# Patient Record
Sex: Female | Born: 1975 | Race: Black or African American | Hispanic: No | Marital: Single | Smoking: Current every day smoker
Health system: Southern US, Community
[De-identification: ages and names within clinical notes are randomized; demographics above are authoritative.]

## PROBLEM LIST (undated history)

## (undated) ENCOUNTER — Emergency Department (HOSPITAL_COMMUNITY): Discharge status: Against Medical Advice | Payer: Medicare Other

## (undated) ENCOUNTER — Emergency Department (HOSPITAL_COMMUNITY): Admission: EM | Discharge status: Absent without leave | Payer: Self-pay | Source: Home / Self Care

## (undated) DIAGNOSIS — N390 Urinary tract infection, site not specified: Secondary | ICD-10-CM

## (undated) DIAGNOSIS — M109 Gout, unspecified: Secondary | ICD-10-CM

## (undated) DIAGNOSIS — F419 Anxiety disorder, unspecified: Secondary | ICD-10-CM

## (undated) DIAGNOSIS — F329 Major depressive disorder, single episode, unspecified: Secondary | ICD-10-CM

## (undated) DIAGNOSIS — E119 Type 2 diabetes mellitus without complications: Secondary | ICD-10-CM

## (undated) DIAGNOSIS — I75029 Atheroembolism of unspecified lower extremity: Secondary | ICD-10-CM

## (undated) DIAGNOSIS — F319 Bipolar disorder, unspecified: Secondary | ICD-10-CM

## (undated) DIAGNOSIS — D649 Anemia, unspecified: Secondary | ICD-10-CM

## (undated) DIAGNOSIS — R51 Headache: Secondary | ICD-10-CM

## (undated) DIAGNOSIS — J302 Other seasonal allergic rhinitis: Secondary | ICD-10-CM

## (undated) DIAGNOSIS — C029 Malignant neoplasm of tongue, unspecified: Secondary | ICD-10-CM

## (undated) DIAGNOSIS — R87619 Unspecified abnormal cytological findings in specimens from cervix uteri: Secondary | ICD-10-CM

## (undated) DIAGNOSIS — IMO0002 Reserved for concepts with insufficient information to code with codable children: Secondary | ICD-10-CM

## (undated) DIAGNOSIS — F32A Depression, unspecified: Secondary | ICD-10-CM

## (undated) DIAGNOSIS — N200 Calculus of kidney: Secondary | ICD-10-CM

## (undated) HISTORY — DX: Major depressive disorder, single episode, unspecified: F32.9

## (undated) HISTORY — DX: Depression, unspecified: F32.A

## (undated) HISTORY — DX: Anxiety disorder, unspecified: F41.9

## (undated) HISTORY — DX: Atheroembolism of unspecified lower extremity: I75.029

## (undated) HISTORY — PX: CHOLECYSTECTOMY: SHX55

---

## 1997-10-27 ENCOUNTER — Other Ambulatory Visit: Admission: RE | Admit: 1997-10-27 | Discharge: 1997-10-27 | Payer: Self-pay | Admitting: Obstetrics

## 1998-01-18 ENCOUNTER — Inpatient Hospital Stay (HOSPITAL_COMMUNITY): Admission: AD | Admit: 1998-01-18 | Discharge: 1998-01-18 | Payer: Self-pay | Admitting: Obstetrics

## 1998-01-21 ENCOUNTER — Inpatient Hospital Stay (HOSPITAL_COMMUNITY): Admission: AD | Admit: 1998-01-21 | Discharge: 1998-01-21 | Payer: Self-pay | Admitting: Obstetrics

## 1998-01-21 ENCOUNTER — Emergency Department (HOSPITAL_COMMUNITY): Admission: EM | Admit: 1998-01-21 | Discharge: 1998-01-21 | Payer: Self-pay | Admitting: Emergency Medicine

## 1998-03-09 ENCOUNTER — Ambulatory Visit (HOSPITAL_COMMUNITY): Admission: RE | Admit: 1998-03-09 | Discharge: 1998-03-09 | Payer: Self-pay | Admitting: *Deleted

## 1998-04-04 ENCOUNTER — Ambulatory Visit (HOSPITAL_COMMUNITY): Admission: RE | Admit: 1998-04-04 | Discharge: 1998-04-04 | Payer: Self-pay | Admitting: *Deleted

## 1998-05-16 ENCOUNTER — Emergency Department (HOSPITAL_COMMUNITY): Admission: EM | Admit: 1998-05-16 | Discharge: 1998-05-16 | Payer: Self-pay | Admitting: Emergency Medicine

## 1998-05-17 ENCOUNTER — Encounter: Payer: Self-pay | Admitting: Emergency Medicine

## 1998-05-17 ENCOUNTER — Ambulatory Visit (HOSPITAL_COMMUNITY): Admission: RE | Admit: 1998-05-17 | Discharge: 1998-05-17 | Payer: Self-pay | Admitting: Emergency Medicine

## 1998-06-19 ENCOUNTER — Emergency Department (HOSPITAL_COMMUNITY): Admission: EM | Admit: 1998-06-19 | Discharge: 1998-06-19 | Payer: Self-pay | Admitting: Emergency Medicine

## 1998-09-11 ENCOUNTER — Emergency Department (HOSPITAL_COMMUNITY): Admission: EM | Admit: 1998-09-11 | Discharge: 1998-09-11 | Payer: Self-pay | Admitting: Emergency Medicine

## 1998-09-11 ENCOUNTER — Emergency Department (HOSPITAL_COMMUNITY): Admission: EM | Admit: 1998-09-11 | Discharge: 1998-09-11 | Payer: Self-pay

## 1998-09-14 ENCOUNTER — Inpatient Hospital Stay (HOSPITAL_COMMUNITY): Admission: AD | Admit: 1998-09-14 | Discharge: 1998-09-14 | Payer: Self-pay | Admitting: Obstetrics

## 1998-09-27 ENCOUNTER — Emergency Department (HOSPITAL_COMMUNITY): Admission: EM | Admit: 1998-09-27 | Discharge: 1998-09-27 | Payer: Self-pay | Admitting: Emergency Medicine

## 1998-10-31 ENCOUNTER — Inpatient Hospital Stay (HOSPITAL_COMMUNITY): Admission: AD | Admit: 1998-10-31 | Discharge: 1998-10-31 | Payer: Self-pay | Admitting: *Deleted

## 1998-12-13 ENCOUNTER — Emergency Department (HOSPITAL_COMMUNITY): Admission: EM | Admit: 1998-12-13 | Discharge: 1998-12-13 | Payer: Self-pay | Admitting: Emergency Medicine

## 1999-02-10 ENCOUNTER — Emergency Department (HOSPITAL_COMMUNITY): Admission: EM | Admit: 1999-02-10 | Discharge: 1999-02-10 | Payer: Self-pay | Admitting: Emergency Medicine

## 1999-02-12 ENCOUNTER — Inpatient Hospital Stay (HOSPITAL_COMMUNITY): Admission: AD | Admit: 1999-02-12 | Discharge: 1999-02-12 | Payer: Self-pay | Admitting: Obstetrics

## 1999-04-25 ENCOUNTER — Emergency Department (HOSPITAL_COMMUNITY): Admission: EM | Admit: 1999-04-25 | Discharge: 1999-04-25 | Payer: Self-pay | Admitting: Emergency Medicine

## 1999-05-09 ENCOUNTER — Encounter: Payer: Self-pay | Admitting: Emergency Medicine

## 1999-05-09 ENCOUNTER — Emergency Department (HOSPITAL_COMMUNITY): Admission: EM | Admit: 1999-05-09 | Discharge: 1999-05-09 | Payer: Self-pay | Admitting: Emergency Medicine

## 1999-05-10 ENCOUNTER — Emergency Department (HOSPITAL_COMMUNITY): Admission: EM | Admit: 1999-05-10 | Discharge: 1999-05-10 | Payer: Self-pay | Admitting: Emergency Medicine

## 1999-06-01 ENCOUNTER — Inpatient Hospital Stay (HOSPITAL_COMMUNITY): Admission: AD | Admit: 1999-06-01 | Discharge: 1999-06-01 | Payer: Self-pay | Admitting: Obstetrics & Gynecology

## 1999-06-10 ENCOUNTER — Emergency Department (HOSPITAL_COMMUNITY): Admission: EM | Admit: 1999-06-10 | Discharge: 1999-06-10 | Payer: Self-pay | Admitting: Emergency Medicine

## 1999-08-22 ENCOUNTER — Emergency Department (HOSPITAL_COMMUNITY): Admission: EM | Admit: 1999-08-22 | Discharge: 1999-08-22 | Payer: Self-pay | Admitting: Emergency Medicine

## 1999-10-21 ENCOUNTER — Emergency Department (HOSPITAL_COMMUNITY): Admission: EM | Admit: 1999-10-21 | Discharge: 1999-10-21 | Payer: Self-pay | Admitting: Emergency Medicine

## 1999-11-28 ENCOUNTER — Other Ambulatory Visit: Admission: RE | Admit: 1999-11-28 | Discharge: 1999-11-28 | Payer: Self-pay | Admitting: Obstetrics

## 1999-12-17 ENCOUNTER — Inpatient Hospital Stay (HOSPITAL_COMMUNITY): Admission: AD | Admit: 1999-12-17 | Discharge: 1999-12-17 | Payer: Self-pay | Admitting: Obstetrics

## 1999-12-22 ENCOUNTER — Inpatient Hospital Stay (HOSPITAL_COMMUNITY): Admission: AD | Admit: 1999-12-22 | Discharge: 1999-12-22 | Payer: Self-pay | Admitting: Obstetrics

## 1999-12-25 ENCOUNTER — Encounter (INDEPENDENT_AMBULATORY_CARE_PROVIDER_SITE_OTHER): Payer: Self-pay

## 1999-12-25 ENCOUNTER — Encounter: Payer: Self-pay | Admitting: Obstetrics

## 1999-12-25 ENCOUNTER — Ambulatory Visit (HOSPITAL_COMMUNITY): Admission: AD | Admit: 1999-12-25 | Discharge: 1999-12-25 | Payer: Self-pay | Admitting: Obstetrics

## 2000-03-14 ENCOUNTER — Emergency Department (HOSPITAL_COMMUNITY): Admission: EM | Admit: 2000-03-14 | Discharge: 2000-03-14 | Payer: Self-pay

## 2000-04-07 ENCOUNTER — Encounter: Payer: Self-pay | Admitting: Emergency Medicine

## 2000-04-07 ENCOUNTER — Emergency Department (HOSPITAL_COMMUNITY): Admission: EM | Admit: 2000-04-07 | Discharge: 2000-04-07 | Payer: Self-pay | Admitting: Emergency Medicine

## 2000-05-05 ENCOUNTER — Emergency Department (HOSPITAL_COMMUNITY): Admission: EM | Admit: 2000-05-05 | Discharge: 2000-05-05 | Payer: Self-pay | Admitting: Emergency Medicine

## 2000-05-23 ENCOUNTER — Emergency Department (HOSPITAL_COMMUNITY): Admission: EM | Admit: 2000-05-23 | Discharge: 2000-05-24 | Payer: Self-pay | Admitting: Emergency Medicine

## 2000-11-14 ENCOUNTER — Encounter: Payer: Self-pay | Admitting: Emergency Medicine

## 2000-11-14 ENCOUNTER — Emergency Department (HOSPITAL_COMMUNITY): Admission: EM | Admit: 2000-11-14 | Discharge: 2000-11-14 | Payer: Self-pay | Admitting: Emergency Medicine

## 2000-11-20 ENCOUNTER — Emergency Department (HOSPITAL_COMMUNITY): Admission: EM | Admit: 2000-11-20 | Discharge: 2000-11-20 | Payer: Self-pay | Admitting: Emergency Medicine

## 2000-11-21 ENCOUNTER — Encounter: Payer: Self-pay | Admitting: Emergency Medicine

## 2001-01-02 ENCOUNTER — Emergency Department (HOSPITAL_COMMUNITY): Admission: EM | Admit: 2001-01-02 | Discharge: 2001-01-02 | Payer: Self-pay | Admitting: Emergency Medicine

## 2001-02-19 ENCOUNTER — Ambulatory Visit (HOSPITAL_COMMUNITY): Admission: RE | Admit: 2001-02-19 | Discharge: 2001-02-20 | Payer: Self-pay | Admitting: General Surgery

## 2001-02-19 ENCOUNTER — Encounter (HOSPITAL_BASED_OUTPATIENT_CLINIC_OR_DEPARTMENT_OTHER): Payer: Self-pay | Admitting: General Surgery

## 2001-02-19 ENCOUNTER — Encounter (INDEPENDENT_AMBULATORY_CARE_PROVIDER_SITE_OTHER): Payer: Self-pay | Admitting: *Deleted

## 2001-02-23 ENCOUNTER — Emergency Department (HOSPITAL_COMMUNITY): Admission: EM | Admit: 2001-02-23 | Discharge: 2001-02-23 | Payer: Self-pay | Admitting: Emergency Medicine

## 2001-02-23 ENCOUNTER — Encounter: Payer: Self-pay | Admitting: Emergency Medicine

## 2001-06-11 ENCOUNTER — Emergency Department (HOSPITAL_COMMUNITY): Admission: EM | Admit: 2001-06-11 | Discharge: 2001-06-11 | Payer: Self-pay | Admitting: Emergency Medicine

## 2001-06-15 ENCOUNTER — Emergency Department (HOSPITAL_COMMUNITY): Admission: EM | Admit: 2001-06-15 | Discharge: 2001-06-15 | Payer: Self-pay | Admitting: Emergency Medicine

## 2001-06-25 ENCOUNTER — Emergency Department (HOSPITAL_COMMUNITY): Admission: EM | Admit: 2001-06-25 | Discharge: 2001-06-25 | Payer: Self-pay | Admitting: Emergency Medicine

## 2001-07-16 ENCOUNTER — Emergency Department (HOSPITAL_COMMUNITY): Admission: EM | Admit: 2001-07-16 | Discharge: 2001-07-16 | Payer: Self-pay | Admitting: Emergency Medicine

## 2001-08-26 ENCOUNTER — Encounter: Admission: RE | Admit: 2001-08-26 | Discharge: 2001-11-24 | Payer: Self-pay | Admitting: Internal Medicine

## 2001-09-16 ENCOUNTER — Inpatient Hospital Stay (HOSPITAL_COMMUNITY): Admission: AD | Admit: 2001-09-16 | Discharge: 2001-09-16 | Payer: Self-pay | Admitting: *Deleted

## 2001-11-21 ENCOUNTER — Encounter: Payer: Self-pay | Admitting: Obstetrics

## 2001-11-21 ENCOUNTER — Ambulatory Visit (HOSPITAL_COMMUNITY): Admission: RE | Admit: 2001-11-21 | Discharge: 2001-11-21 | Payer: Self-pay | Admitting: Obstetrics

## 2002-01-14 ENCOUNTER — Inpatient Hospital Stay (HOSPITAL_COMMUNITY): Admission: AD | Admit: 2002-01-14 | Discharge: 2002-01-14 | Payer: Self-pay | Admitting: Obstetrics

## 2002-02-04 ENCOUNTER — Encounter: Payer: Self-pay | Admitting: *Deleted

## 2002-02-04 ENCOUNTER — Ambulatory Visit (HOSPITAL_COMMUNITY): Admission: RE | Admit: 2002-02-04 | Discharge: 2002-02-04 | Payer: Self-pay | Admitting: *Deleted

## 2002-02-16 ENCOUNTER — Inpatient Hospital Stay (HOSPITAL_COMMUNITY): Admission: AD | Admit: 2002-02-16 | Discharge: 2002-02-16 | Payer: Self-pay | Admitting: *Deleted

## 2002-03-05 ENCOUNTER — Encounter (HOSPITAL_COMMUNITY): Admission: RE | Admit: 2002-03-05 | Discharge: 2002-04-04 | Payer: Self-pay | Admitting: *Deleted

## 2002-03-30 ENCOUNTER — Inpatient Hospital Stay (HOSPITAL_COMMUNITY): Admission: AD | Admit: 2002-03-30 | Discharge: 2002-03-30 | Payer: Self-pay | Admitting: *Deleted

## 2002-04-04 ENCOUNTER — Inpatient Hospital Stay (HOSPITAL_COMMUNITY): Admission: AD | Admit: 2002-04-04 | Discharge: 2002-04-06 | Payer: Self-pay | Admitting: *Deleted

## 2002-04-04 ENCOUNTER — Encounter (INDEPENDENT_AMBULATORY_CARE_PROVIDER_SITE_OTHER): Payer: Self-pay | Admitting: Specialist

## 2002-04-07 ENCOUNTER — Inpatient Hospital Stay (HOSPITAL_COMMUNITY): Admission: AD | Admit: 2002-04-07 | Discharge: 2002-04-07 | Payer: Self-pay | Admitting: *Deleted

## 2002-07-23 ENCOUNTER — Emergency Department (HOSPITAL_COMMUNITY): Admission: EM | Admit: 2002-07-23 | Discharge: 2002-07-23 | Payer: Self-pay | Admitting: Emergency Medicine

## 2002-07-28 ENCOUNTER — Other Ambulatory Visit: Admission: RE | Admit: 2002-07-28 | Discharge: 2002-07-28 | Payer: Self-pay | Admitting: *Deleted

## 2002-08-08 ENCOUNTER — Emergency Department (HOSPITAL_COMMUNITY): Admission: EM | Admit: 2002-08-08 | Discharge: 2002-08-08 | Payer: Self-pay | Admitting: Emergency Medicine

## 2002-09-02 ENCOUNTER — Emergency Department (HOSPITAL_COMMUNITY): Admission: EM | Admit: 2002-09-02 | Discharge: 2002-09-02 | Payer: Self-pay | Admitting: Emergency Medicine

## 2002-10-05 ENCOUNTER — Emergency Department (HOSPITAL_COMMUNITY): Admission: EM | Admit: 2002-10-05 | Discharge: 2002-10-06 | Payer: Self-pay | Admitting: Emergency Medicine

## 2002-10-10 ENCOUNTER — Emergency Department (HOSPITAL_COMMUNITY): Admission: EM | Admit: 2002-10-10 | Discharge: 2002-10-10 | Payer: Self-pay | Admitting: Emergency Medicine

## 2002-10-11 ENCOUNTER — Emergency Department (HOSPITAL_COMMUNITY): Admission: EM | Admit: 2002-10-11 | Discharge: 2002-10-11 | Payer: Self-pay | Admitting: Emergency Medicine

## 2002-10-16 ENCOUNTER — Emergency Department (HOSPITAL_COMMUNITY): Admission: EM | Admit: 2002-10-16 | Discharge: 2002-10-17 | Payer: Self-pay

## 2002-10-28 ENCOUNTER — Emergency Department (HOSPITAL_COMMUNITY): Admission: EM | Admit: 2002-10-28 | Discharge: 2002-10-28 | Payer: Self-pay | Admitting: Emergency Medicine

## 2002-11-02 ENCOUNTER — Emergency Department (HOSPITAL_COMMUNITY): Admission: EM | Admit: 2002-11-02 | Discharge: 2002-11-02 | Payer: Self-pay | Admitting: Emergency Medicine

## 2002-11-11 ENCOUNTER — Emergency Department (HOSPITAL_COMMUNITY): Admission: EM | Admit: 2002-11-11 | Discharge: 2002-11-11 | Payer: Self-pay | Admitting: Emergency Medicine

## 2002-11-15 ENCOUNTER — Emergency Department (HOSPITAL_COMMUNITY): Admission: EM | Admit: 2002-11-15 | Discharge: 2002-11-15 | Payer: Self-pay | Admitting: Emergency Medicine

## 2002-11-23 ENCOUNTER — Emergency Department (HOSPITAL_COMMUNITY): Admission: EM | Admit: 2002-11-23 | Discharge: 2002-11-23 | Payer: Self-pay | Admitting: Emergency Medicine

## 2002-11-24 ENCOUNTER — Emergency Department (HOSPITAL_COMMUNITY): Admission: EM | Admit: 2002-11-24 | Discharge: 2002-11-24 | Payer: Self-pay | Admitting: Emergency Medicine

## 2002-12-04 ENCOUNTER — Emergency Department (HOSPITAL_COMMUNITY): Admission: EM | Admit: 2002-12-04 | Discharge: 2002-12-04 | Payer: Self-pay | Admitting: Emergency Medicine

## 2002-12-04 ENCOUNTER — Encounter: Payer: Self-pay | Admitting: Emergency Medicine

## 2002-12-19 ENCOUNTER — Inpatient Hospital Stay (HOSPITAL_COMMUNITY): Admission: AD | Admit: 2002-12-19 | Discharge: 2002-12-19 | Payer: Self-pay | Admitting: Family Medicine

## 2002-12-21 ENCOUNTER — Emergency Department (HOSPITAL_COMMUNITY): Admission: EM | Admit: 2002-12-21 | Discharge: 2002-12-21 | Payer: Self-pay | Admitting: Emergency Medicine

## 2003-02-06 ENCOUNTER — Emergency Department (HOSPITAL_COMMUNITY): Admission: EM | Admit: 2003-02-06 | Discharge: 2003-02-06 | Payer: Self-pay | Admitting: Emergency Medicine

## 2003-02-11 ENCOUNTER — Emergency Department (HOSPITAL_COMMUNITY): Admission: EM | Admit: 2003-02-11 | Discharge: 2003-02-11 | Payer: Self-pay | Admitting: Emergency Medicine

## 2003-02-23 ENCOUNTER — Encounter: Admission: RE | Admit: 2003-02-23 | Discharge: 2003-05-24 | Payer: Self-pay | Admitting: Family Medicine

## 2003-02-26 ENCOUNTER — Emergency Department (HOSPITAL_COMMUNITY): Admission: EM | Admit: 2003-02-26 | Discharge: 2003-02-26 | Payer: Self-pay | Admitting: Emergency Medicine

## 2003-03-12 ENCOUNTER — Other Ambulatory Visit: Admission: RE | Admit: 2003-03-12 | Discharge: 2003-03-12 | Payer: Self-pay | Admitting: Obstetrics and Gynecology

## 2003-03-16 ENCOUNTER — Emergency Department (HOSPITAL_COMMUNITY): Admission: EM | Admit: 2003-03-16 | Discharge: 2003-03-17 | Payer: Self-pay | Admitting: Emergency Medicine

## 2003-04-05 ENCOUNTER — Emergency Department (HOSPITAL_COMMUNITY): Admission: AD | Admit: 2003-04-05 | Discharge: 2003-04-05 | Payer: Self-pay

## 2003-04-27 ENCOUNTER — Emergency Department (HOSPITAL_COMMUNITY): Admission: EM | Admit: 2003-04-27 | Discharge: 2003-04-27 | Payer: Self-pay | Admitting: Emergency Medicine

## 2003-05-10 ENCOUNTER — Encounter: Admission: RE | Admit: 2003-05-10 | Discharge: 2003-05-10 | Payer: Self-pay | Admitting: Family Medicine

## 2003-05-26 ENCOUNTER — Encounter: Admission: RE | Admit: 2003-05-26 | Discharge: 2003-05-26 | Payer: Self-pay | Admitting: *Deleted

## 2003-06-02 ENCOUNTER — Encounter: Admission: RE | Admit: 2003-06-02 | Discharge: 2003-06-02 | Payer: Self-pay | Admitting: *Deleted

## 2003-06-02 ENCOUNTER — Ambulatory Visit (HOSPITAL_COMMUNITY): Admission: RE | Admit: 2003-06-02 | Discharge: 2003-06-02 | Payer: Self-pay | Admitting: *Deleted

## 2003-06-10 ENCOUNTER — Encounter: Admission: RE | Admit: 2003-06-10 | Discharge: 2003-06-10 | Payer: Self-pay | Admitting: *Deleted

## 2003-06-17 ENCOUNTER — Encounter: Admission: RE | Admit: 2003-06-17 | Discharge: 2003-06-17 | Payer: Self-pay | Admitting: *Deleted

## 2003-06-21 ENCOUNTER — Inpatient Hospital Stay (HOSPITAL_COMMUNITY): Admission: AD | Admit: 2003-06-21 | Discharge: 2003-06-23 | Payer: Self-pay | Admitting: Obstetrics & Gynecology

## 2003-07-01 ENCOUNTER — Encounter: Admission: RE | Admit: 2003-07-01 | Discharge: 2003-07-01 | Payer: Self-pay | Admitting: *Deleted

## 2003-07-25 ENCOUNTER — Emergency Department (HOSPITAL_COMMUNITY): Admission: EM | Admit: 2003-07-25 | Discharge: 2003-07-25 | Payer: Self-pay | Admitting: Emergency Medicine

## 2003-08-22 ENCOUNTER — Emergency Department (HOSPITAL_COMMUNITY): Admission: EM | Admit: 2003-08-22 | Discharge: 2003-08-22 | Payer: Self-pay | Admitting: Emergency Medicine

## 2003-09-01 ENCOUNTER — Encounter: Admission: RE | Admit: 2003-09-01 | Discharge: 2003-09-01 | Payer: Self-pay | Admitting: *Deleted

## 2003-09-02 ENCOUNTER — Ambulatory Visit (HOSPITAL_COMMUNITY): Admission: RE | Admit: 2003-09-02 | Discharge: 2003-09-02 | Payer: Self-pay | Admitting: *Deleted

## 2003-09-06 ENCOUNTER — Inpatient Hospital Stay (HOSPITAL_COMMUNITY): Admission: RE | Admit: 2003-09-06 | Discharge: 2003-09-06 | Payer: Self-pay | Admitting: Obstetrics and Gynecology

## 2003-09-08 ENCOUNTER — Encounter: Admission: RE | Admit: 2003-09-08 | Discharge: 2003-09-08 | Payer: Self-pay | Admitting: *Deleted

## 2003-09-25 ENCOUNTER — Inpatient Hospital Stay (HOSPITAL_COMMUNITY): Admission: AD | Admit: 2003-09-25 | Discharge: 2003-09-25 | Payer: Self-pay | Admitting: *Deleted

## 2003-09-30 ENCOUNTER — Encounter: Admission: RE | Admit: 2003-09-30 | Discharge: 2003-09-30 | Payer: Self-pay | Admitting: *Deleted

## 2003-10-04 ENCOUNTER — Inpatient Hospital Stay (HOSPITAL_COMMUNITY): Admission: AD | Admit: 2003-10-04 | Discharge: 2003-10-10 | Payer: Self-pay | Admitting: *Deleted

## 2003-10-04 ENCOUNTER — Encounter: Admission: RE | Admit: 2003-10-04 | Discharge: 2003-10-04 | Payer: Self-pay | Admitting: *Deleted

## 2003-10-07 ENCOUNTER — Encounter (INDEPENDENT_AMBULATORY_CARE_PROVIDER_SITE_OTHER): Payer: Self-pay | Admitting: Specialist

## 2003-10-27 ENCOUNTER — Emergency Department (HOSPITAL_COMMUNITY): Admission: EM | Admit: 2003-10-27 | Discharge: 2003-10-27 | Payer: Self-pay | Admitting: Family Medicine

## 2003-10-29 ENCOUNTER — Encounter: Admission: RE | Admit: 2003-10-29 | Discharge: 2003-10-29 | Payer: Self-pay | Admitting: Family Medicine

## 2003-11-08 ENCOUNTER — Emergency Department (HOSPITAL_COMMUNITY): Admission: EM | Admit: 2003-11-08 | Discharge: 2003-11-08 | Payer: Self-pay | Admitting: Emergency Medicine

## 2003-11-22 ENCOUNTER — Emergency Department (HOSPITAL_COMMUNITY): Admission: EM | Admit: 2003-11-22 | Discharge: 2003-11-23 | Payer: Self-pay | Admitting: Emergency Medicine

## 2003-11-24 ENCOUNTER — Emergency Department (HOSPITAL_COMMUNITY): Admission: EM | Admit: 2003-11-24 | Discharge: 2003-11-25 | Payer: Self-pay | Admitting: Emergency Medicine

## 2003-12-10 ENCOUNTER — Emergency Department (HOSPITAL_COMMUNITY): Admission: EM | Admit: 2003-12-10 | Discharge: 2003-12-11 | Payer: Self-pay | Admitting: Emergency Medicine

## 2003-12-25 ENCOUNTER — Emergency Department (HOSPITAL_COMMUNITY): Admission: EM | Admit: 2003-12-25 | Discharge: 2003-12-25 | Payer: Self-pay | Admitting: Family Medicine

## 2004-01-10 ENCOUNTER — Emergency Department (HOSPITAL_COMMUNITY): Admission: EM | Admit: 2004-01-10 | Discharge: 2004-01-10 | Payer: Self-pay | Admitting: Family Medicine

## 2004-06-06 ENCOUNTER — Emergency Department (HOSPITAL_COMMUNITY): Admission: EM | Admit: 2004-06-06 | Discharge: 2004-06-06 | Payer: Self-pay | Admitting: Family Medicine

## 2004-06-24 ENCOUNTER — Emergency Department (HOSPITAL_COMMUNITY): Admission: EM | Admit: 2004-06-24 | Discharge: 2004-06-24 | Payer: Self-pay | Admitting: Emergency Medicine

## 2004-07-21 ENCOUNTER — Emergency Department (HOSPITAL_COMMUNITY): Admission: EM | Admit: 2004-07-21 | Discharge: 2004-07-21 | Payer: Self-pay | Admitting: Emergency Medicine

## 2004-08-25 ENCOUNTER — Emergency Department (HOSPITAL_COMMUNITY): Admission: EM | Admit: 2004-08-25 | Discharge: 2004-08-25 | Payer: Self-pay | Admitting: Emergency Medicine

## 2004-08-26 ENCOUNTER — Emergency Department (HOSPITAL_COMMUNITY): Admission: EM | Admit: 2004-08-26 | Discharge: 2004-08-26 | Payer: Self-pay | Admitting: Emergency Medicine

## 2004-09-04 ENCOUNTER — Ambulatory Visit: Payer: Self-pay | Admitting: Internal Medicine

## 2004-09-04 ENCOUNTER — Emergency Department (HOSPITAL_COMMUNITY): Admission: EM | Admit: 2004-09-04 | Discharge: 2004-09-04 | Payer: Self-pay | Admitting: Emergency Medicine

## 2004-09-27 ENCOUNTER — Ambulatory Visit: Payer: Self-pay | Admitting: Internal Medicine

## 2004-10-01 ENCOUNTER — Emergency Department (HOSPITAL_COMMUNITY): Admission: EM | Admit: 2004-10-01 | Discharge: 2004-10-01 | Payer: Self-pay | Admitting: Emergency Medicine

## 2004-10-06 ENCOUNTER — Encounter: Admission: RE | Admit: 2004-10-06 | Discharge: 2004-10-06 | Payer: Self-pay | Admitting: Cardiology

## 2004-10-06 ENCOUNTER — Ambulatory Visit (HOSPITAL_COMMUNITY): Admission: RE | Admit: 2004-10-06 | Discharge: 2004-10-06 | Payer: Self-pay | Admitting: Cardiology

## 2004-10-17 ENCOUNTER — Ambulatory Visit: Payer: Self-pay | Admitting: Internal Medicine

## 2004-10-29 ENCOUNTER — Inpatient Hospital Stay (HOSPITAL_COMMUNITY): Admission: AD | Admit: 2004-10-29 | Discharge: 2004-10-29 | Payer: Self-pay | Admitting: *Deleted

## 2004-10-30 ENCOUNTER — Emergency Department (HOSPITAL_COMMUNITY): Admission: EM | Admit: 2004-10-30 | Discharge: 2004-10-31 | Payer: Self-pay | Admitting: Emergency Medicine

## 2004-10-30 ENCOUNTER — Ambulatory Visit (HOSPITAL_COMMUNITY): Admission: RE | Admit: 2004-10-30 | Discharge: 2004-10-30 | Payer: Self-pay | Admitting: *Deleted

## 2004-10-31 ENCOUNTER — Emergency Department (HOSPITAL_COMMUNITY): Admission: EM | Admit: 2004-10-31 | Discharge: 2004-10-31 | Payer: Self-pay | Admitting: Emergency Medicine

## 2004-11-02 ENCOUNTER — Ambulatory Visit: Payer: Self-pay | Admitting: Internal Medicine

## 2004-11-11 ENCOUNTER — Emergency Department (HOSPITAL_COMMUNITY): Admission: EM | Admit: 2004-11-11 | Discharge: 2004-11-11 | Payer: Self-pay | Admitting: Emergency Medicine

## 2004-11-16 ENCOUNTER — Ambulatory Visit (HOSPITAL_COMMUNITY): Admission: RE | Admit: 2004-11-16 | Discharge: 2004-11-16 | Payer: Self-pay | Admitting: *Deleted

## 2004-11-21 ENCOUNTER — Emergency Department (HOSPITAL_COMMUNITY): Admission: EM | Admit: 2004-11-21 | Discharge: 2004-11-22 | Payer: Self-pay | Admitting: Emergency Medicine

## 2004-11-25 ENCOUNTER — Emergency Department (HOSPITAL_COMMUNITY): Admission: EM | Admit: 2004-11-25 | Discharge: 2004-11-25 | Payer: Self-pay | Admitting: Emergency Medicine

## 2005-01-04 ENCOUNTER — Ambulatory Visit: Payer: Self-pay | Admitting: Internal Medicine

## 2005-01-16 ENCOUNTER — Emergency Department (HOSPITAL_COMMUNITY): Admission: EM | Admit: 2005-01-16 | Discharge: 2005-01-17 | Payer: Self-pay | Admitting: Emergency Medicine

## 2005-01-17 ENCOUNTER — Emergency Department (HOSPITAL_COMMUNITY): Admission: EM | Admit: 2005-01-17 | Discharge: 2005-01-17 | Payer: Self-pay | Admitting: Emergency Medicine

## 2005-01-18 ENCOUNTER — Ambulatory Visit: Payer: Self-pay | Admitting: Internal Medicine

## 2005-01-22 ENCOUNTER — Emergency Department (HOSPITAL_COMMUNITY): Admission: EM | Admit: 2005-01-22 | Discharge: 2005-01-22 | Payer: Self-pay | Admitting: Emergency Medicine

## 2005-01-23 ENCOUNTER — Inpatient Hospital Stay (HOSPITAL_COMMUNITY): Admission: AD | Admit: 2005-01-23 | Discharge: 2005-01-23 | Payer: Self-pay | Admitting: Obstetrics & Gynecology

## 2005-01-25 ENCOUNTER — Emergency Department (HOSPITAL_COMMUNITY): Admission: EM | Admit: 2005-01-25 | Discharge: 2005-01-25 | Payer: Self-pay | Admitting: Emergency Medicine

## 2005-01-31 ENCOUNTER — Ambulatory Visit: Payer: Self-pay | Admitting: Internal Medicine

## 2005-02-05 ENCOUNTER — Emergency Department (HOSPITAL_COMMUNITY): Admission: EM | Admit: 2005-02-05 | Discharge: 2005-02-05 | Payer: Self-pay | Admitting: *Deleted

## 2005-04-03 ENCOUNTER — Emergency Department (HOSPITAL_COMMUNITY): Admission: EM | Admit: 2005-04-03 | Discharge: 2005-04-03 | Payer: Self-pay | Admitting: Emergency Medicine

## 2005-04-10 ENCOUNTER — Encounter: Admission: RE | Admit: 2005-04-10 | Discharge: 2005-05-08 | Payer: Self-pay | Admitting: Family Medicine

## 2005-06-01 ENCOUNTER — Emergency Department (HOSPITAL_COMMUNITY): Admission: EM | Admit: 2005-06-01 | Discharge: 2005-06-01 | Payer: Self-pay | Admitting: Emergency Medicine

## 2005-06-14 ENCOUNTER — Inpatient Hospital Stay (HOSPITAL_COMMUNITY): Admission: EM | Admit: 2005-06-14 | Discharge: 2005-06-17 | Payer: Self-pay | Admitting: Emergency Medicine

## 2005-06-14 ENCOUNTER — Ambulatory Visit: Payer: Self-pay | Admitting: Family Medicine

## 2005-06-17 ENCOUNTER — Emergency Department (HOSPITAL_COMMUNITY): Admission: EM | Admit: 2005-06-17 | Discharge: 2005-06-17 | Payer: Self-pay | Admitting: Emergency Medicine

## 2005-07-05 ENCOUNTER — Ambulatory Visit (HOSPITAL_COMMUNITY): Admission: RE | Admit: 2005-07-05 | Discharge: 2005-07-05 | Payer: Self-pay | Admitting: Family Medicine

## 2005-07-09 ENCOUNTER — Ambulatory Visit: Payer: Self-pay | Admitting: Internal Medicine

## 2005-07-26 ENCOUNTER — Ambulatory Visit: Payer: Self-pay | Admitting: Internal Medicine

## 2005-07-31 ENCOUNTER — Emergency Department (HOSPITAL_COMMUNITY): Admission: EM | Admit: 2005-07-31 | Discharge: 2005-08-01 | Payer: Self-pay | Admitting: Emergency Medicine

## 2005-08-21 ENCOUNTER — Emergency Department (HOSPITAL_COMMUNITY): Admission: EM | Admit: 2005-08-21 | Discharge: 2005-08-21 | Payer: Self-pay | Admitting: Family Medicine

## 2005-09-05 ENCOUNTER — Emergency Department (HOSPITAL_COMMUNITY): Admission: EM | Admit: 2005-09-05 | Discharge: 2005-09-05 | Payer: Self-pay | Admitting: Emergency Medicine

## 2005-10-16 ENCOUNTER — Inpatient Hospital Stay (HOSPITAL_COMMUNITY): Admission: EM | Admit: 2005-10-16 | Discharge: 2005-10-17 | Payer: Self-pay | Admitting: Emergency Medicine

## 2005-11-07 ENCOUNTER — Ambulatory Visit: Payer: Self-pay | Admitting: Oncology

## 2005-11-21 ENCOUNTER — Emergency Department (HOSPITAL_COMMUNITY): Admission: EM | Admit: 2005-11-21 | Discharge: 2005-11-21 | Payer: Self-pay | Admitting: Family Medicine

## 2006-01-02 ENCOUNTER — Encounter: Admission: RE | Admit: 2006-01-02 | Discharge: 2006-01-02 | Payer: Self-pay | Admitting: Orthopedic Surgery

## 2006-01-20 ENCOUNTER — Emergency Department (HOSPITAL_COMMUNITY): Admission: EM | Admit: 2006-01-20 | Discharge: 2006-01-20 | Payer: Self-pay | Admitting: Emergency Medicine

## 2006-01-21 ENCOUNTER — Emergency Department (HOSPITAL_COMMUNITY): Admission: EM | Admit: 2006-01-21 | Discharge: 2006-01-21 | Payer: Self-pay | Admitting: Emergency Medicine

## 2006-01-29 ENCOUNTER — Observation Stay (HOSPITAL_COMMUNITY): Admission: EM | Admit: 2006-01-29 | Discharge: 2006-01-30 | Payer: Self-pay | Admitting: *Deleted

## 2006-03-04 ENCOUNTER — Ambulatory Visit: Payer: Self-pay | Admitting: Internal Medicine

## 2006-03-30 ENCOUNTER — Emergency Department (HOSPITAL_COMMUNITY): Admission: EM | Admit: 2006-03-30 | Discharge: 2006-03-30 | Payer: Self-pay | Admitting: Family Medicine

## 2006-04-07 ENCOUNTER — Emergency Department (HOSPITAL_COMMUNITY): Admission: EM | Admit: 2006-04-07 | Discharge: 2006-04-08 | Payer: Self-pay | Admitting: Emergency Medicine

## 2006-04-18 ENCOUNTER — Emergency Department (HOSPITAL_COMMUNITY): Admission: EM | Admit: 2006-04-18 | Discharge: 2006-04-18 | Payer: Self-pay | Admitting: Pediatrics

## 2006-07-02 ENCOUNTER — Emergency Department (HOSPITAL_COMMUNITY): Admission: EM | Admit: 2006-07-02 | Discharge: 2006-07-02 | Payer: Self-pay | Admitting: Emergency Medicine

## 2006-07-16 ENCOUNTER — Emergency Department (HOSPITAL_COMMUNITY): Admission: EM | Admit: 2006-07-16 | Discharge: 2006-07-16 | Payer: Self-pay | Admitting: Emergency Medicine

## 2006-07-25 ENCOUNTER — Other Ambulatory Visit: Admission: RE | Admit: 2006-07-25 | Discharge: 2006-07-25 | Payer: Self-pay | Admitting: Family Medicine

## 2006-08-10 ENCOUNTER — Emergency Department (HOSPITAL_COMMUNITY): Admission: EM | Admit: 2006-08-10 | Discharge: 2006-08-10 | Payer: Self-pay | Admitting: Family Medicine

## 2006-11-18 ENCOUNTER — Emergency Department (HOSPITAL_COMMUNITY): Admission: EM | Admit: 2006-11-18 | Discharge: 2006-11-18 | Payer: Self-pay | Admitting: Emergency Medicine

## 2006-12-01 ENCOUNTER — Emergency Department (HOSPITAL_COMMUNITY): Admission: EM | Admit: 2006-12-01 | Discharge: 2006-12-01 | Payer: Self-pay | Admitting: Emergency Medicine

## 2006-12-25 ENCOUNTER — Emergency Department (HOSPITAL_COMMUNITY): Admission: EM | Admit: 2006-12-25 | Discharge: 2006-12-25 | Payer: Self-pay | Admitting: Emergency Medicine

## 2006-12-26 ENCOUNTER — Emergency Department (HOSPITAL_COMMUNITY): Admission: EM | Admit: 2006-12-26 | Discharge: 2006-12-26 | Payer: Self-pay | Admitting: Emergency Medicine

## 2007-01-03 ENCOUNTER — Inpatient Hospital Stay (HOSPITAL_COMMUNITY): Admission: EM | Admit: 2007-01-03 | Discharge: 2007-01-07 | Payer: Self-pay | Admitting: Emergency Medicine

## 2007-01-13 ENCOUNTER — Inpatient Hospital Stay (HOSPITAL_COMMUNITY): Admission: EM | Admit: 2007-01-13 | Discharge: 2007-01-19 | Payer: Self-pay | Admitting: Emergency Medicine

## 2007-01-22 ENCOUNTER — Inpatient Hospital Stay (HOSPITAL_COMMUNITY): Admission: EM | Admit: 2007-01-22 | Discharge: 2007-01-26 | Payer: Self-pay | Admitting: *Deleted

## 2007-02-19 ENCOUNTER — Emergency Department (HOSPITAL_COMMUNITY): Admission: EM | Admit: 2007-02-19 | Discharge: 2007-02-19 | Payer: Self-pay | Admitting: Emergency Medicine

## 2007-04-15 ENCOUNTER — Emergency Department (HOSPITAL_COMMUNITY): Admission: EM | Admit: 2007-04-15 | Discharge: 2007-04-16 | Payer: Self-pay | Admitting: Emergency Medicine

## 2007-04-16 ENCOUNTER — Ambulatory Visit: Payer: Self-pay | Admitting: Internal Medicine

## 2007-04-16 ENCOUNTER — Inpatient Hospital Stay (HOSPITAL_COMMUNITY): Admission: EM | Admit: 2007-04-16 | Discharge: 2007-04-17 | Payer: Self-pay | Admitting: Emergency Medicine

## 2007-04-18 ENCOUNTER — Emergency Department (HOSPITAL_COMMUNITY): Admission: EM | Admit: 2007-04-18 | Discharge: 2007-04-18 | Payer: Self-pay | Admitting: Emergency Medicine

## 2007-05-05 DIAGNOSIS — L0292 Furuncle, unspecified: Secondary | ICD-10-CM | POA: Insufficient documentation

## 2007-05-05 DIAGNOSIS — K219 Gastro-esophageal reflux disease without esophagitis: Secondary | ICD-10-CM

## 2007-05-05 DIAGNOSIS — F17200 Nicotine dependence, unspecified, uncomplicated: Secondary | ICD-10-CM

## 2007-05-05 DIAGNOSIS — L0293 Carbuncle, unspecified: Secondary | ICD-10-CM

## 2007-05-05 DIAGNOSIS — E119 Type 2 diabetes mellitus without complications: Secondary | ICD-10-CM

## 2007-05-05 DIAGNOSIS — J309 Allergic rhinitis, unspecified: Secondary | ICD-10-CM | POA: Insufficient documentation

## 2007-05-05 DIAGNOSIS — M549 Dorsalgia, unspecified: Secondary | ICD-10-CM

## 2007-05-05 DIAGNOSIS — D509 Iron deficiency anemia, unspecified: Secondary | ICD-10-CM | POA: Insufficient documentation

## 2007-05-05 DIAGNOSIS — F411 Generalized anxiety disorder: Secondary | ICD-10-CM | POA: Insufficient documentation

## 2007-05-05 DIAGNOSIS — E1149 Type 2 diabetes mellitus with other diabetic neurological complication: Secondary | ICD-10-CM

## 2007-06-02 ENCOUNTER — Emergency Department: Payer: Self-pay | Admitting: Emergency Medicine

## 2007-12-31 ENCOUNTER — Emergency Department (HOSPITAL_COMMUNITY): Admission: EM | Admit: 2007-12-31 | Discharge: 2007-12-31 | Payer: Self-pay | Admitting: Emergency Medicine

## 2008-01-01 ENCOUNTER — Telehealth (INDEPENDENT_AMBULATORY_CARE_PROVIDER_SITE_OTHER): Payer: Self-pay | Admitting: *Deleted

## 2008-02-06 ENCOUNTER — Emergency Department (HOSPITAL_COMMUNITY): Admission: EM | Admit: 2008-02-06 | Discharge: 2008-02-06 | Payer: Self-pay | Admitting: Emergency Medicine

## 2008-02-13 ENCOUNTER — Emergency Department (HOSPITAL_COMMUNITY): Admission: EM | Admit: 2008-02-13 | Discharge: 2008-02-13 | Payer: Self-pay | Admitting: Obstetrics & Gynecology

## 2008-03-08 ENCOUNTER — Encounter: Payer: Self-pay | Admitting: Emergency Medicine

## 2008-03-09 ENCOUNTER — Inpatient Hospital Stay (HOSPITAL_COMMUNITY): Admission: AD | Admit: 2008-03-09 | Discharge: 2008-03-10 | Payer: Self-pay | Admitting: *Deleted

## 2008-03-09 ENCOUNTER — Ambulatory Visit: Payer: Self-pay | Admitting: *Deleted

## 2008-03-23 ENCOUNTER — Emergency Department (HOSPITAL_COMMUNITY): Admission: EM | Admit: 2008-03-23 | Discharge: 2008-03-23 | Payer: Self-pay | Admitting: *Deleted

## 2008-03-23 ENCOUNTER — Encounter: Payer: Self-pay | Admitting: Family Medicine

## 2008-09-25 IMAGING — CR DG ABDOMEN ACUTE W/ 1V CHEST
3 series · 3 of 3 positions shown · non-contrast
Comparison: none

HISTORY: Abdominal pain, hematemesis, chest pain, back pain, sickle cell disease

[w abdomen upright]
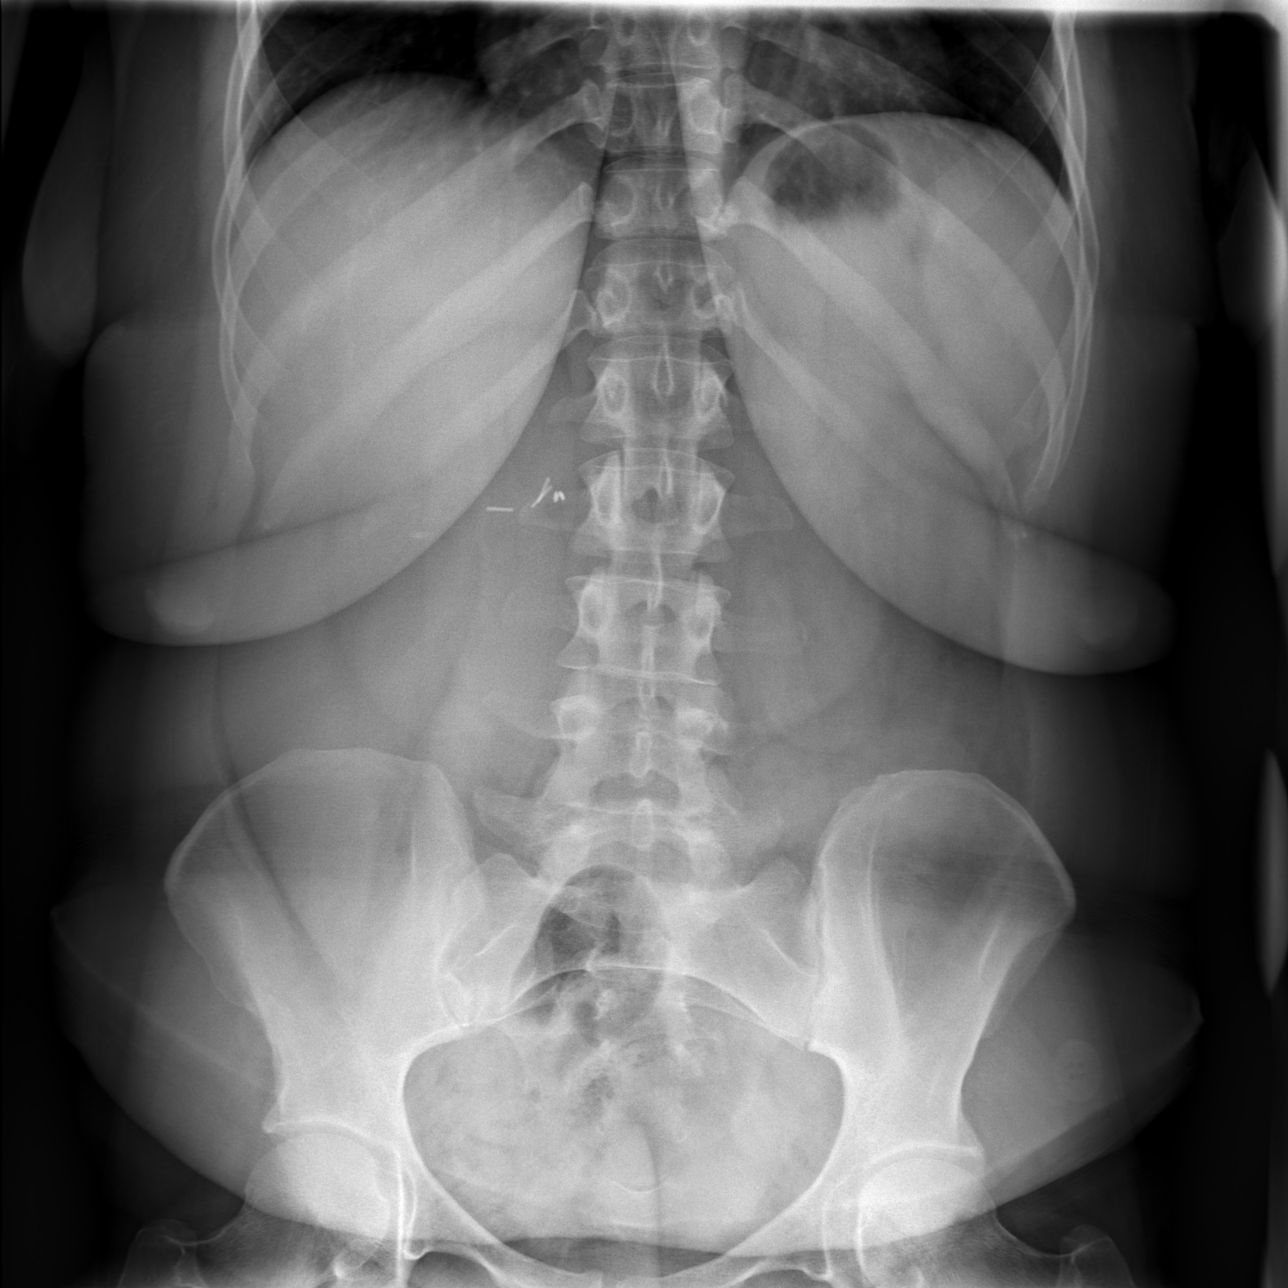

[w chest pa]
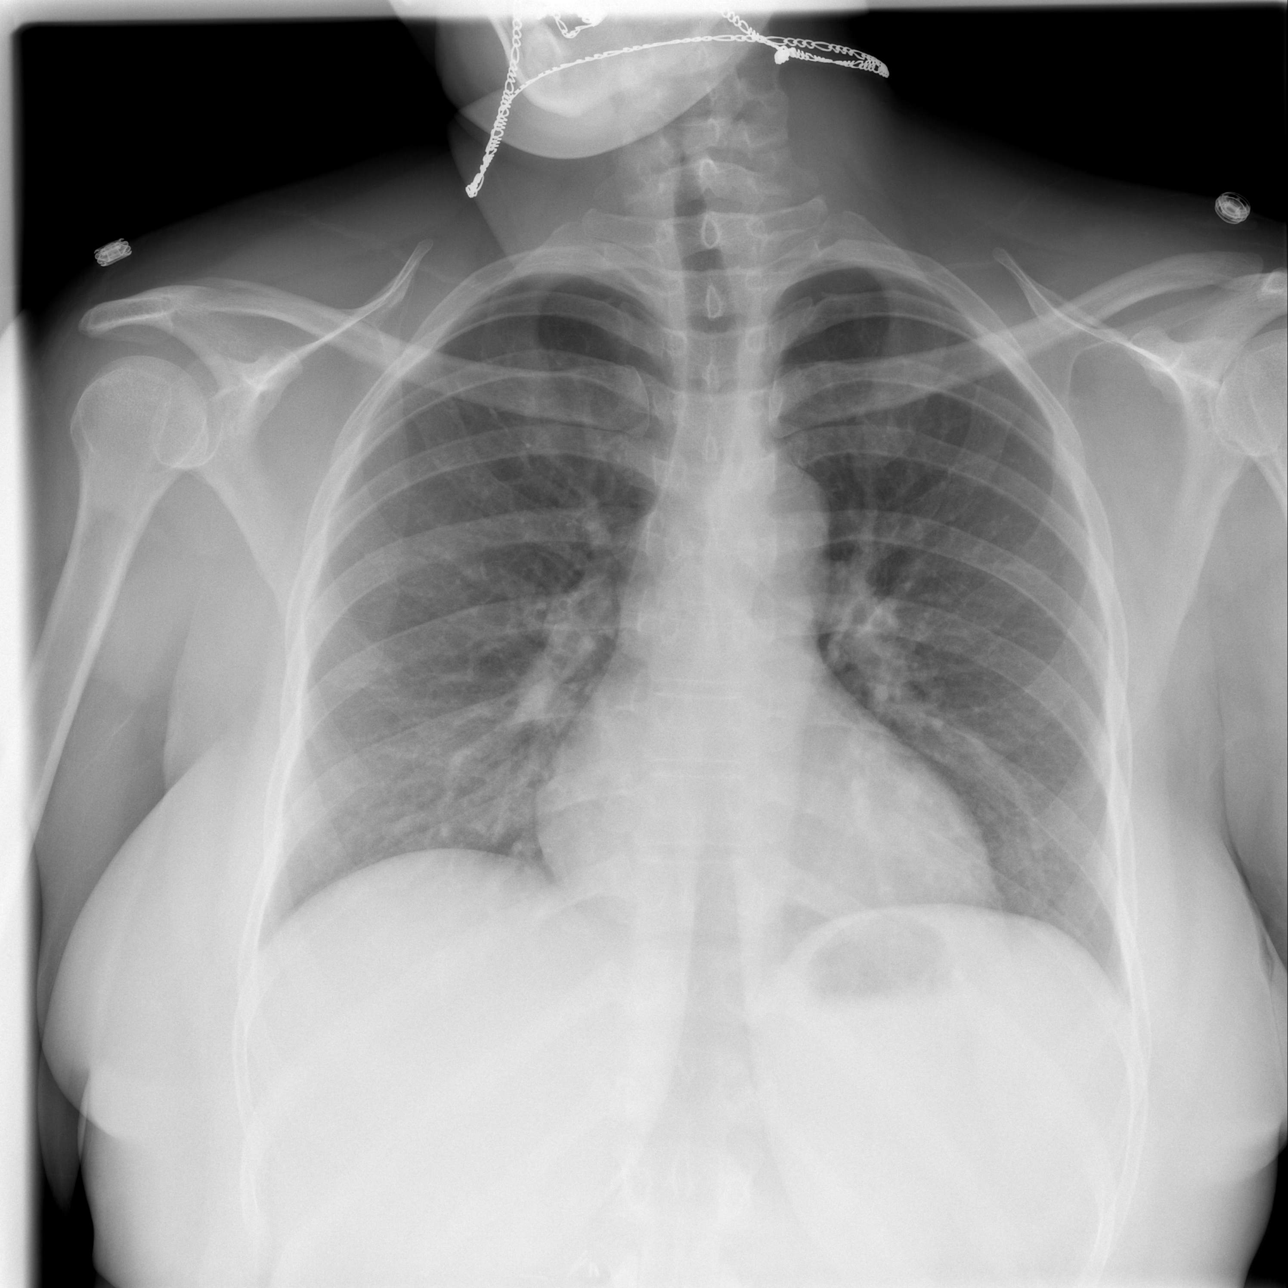

[t abdomen supine]
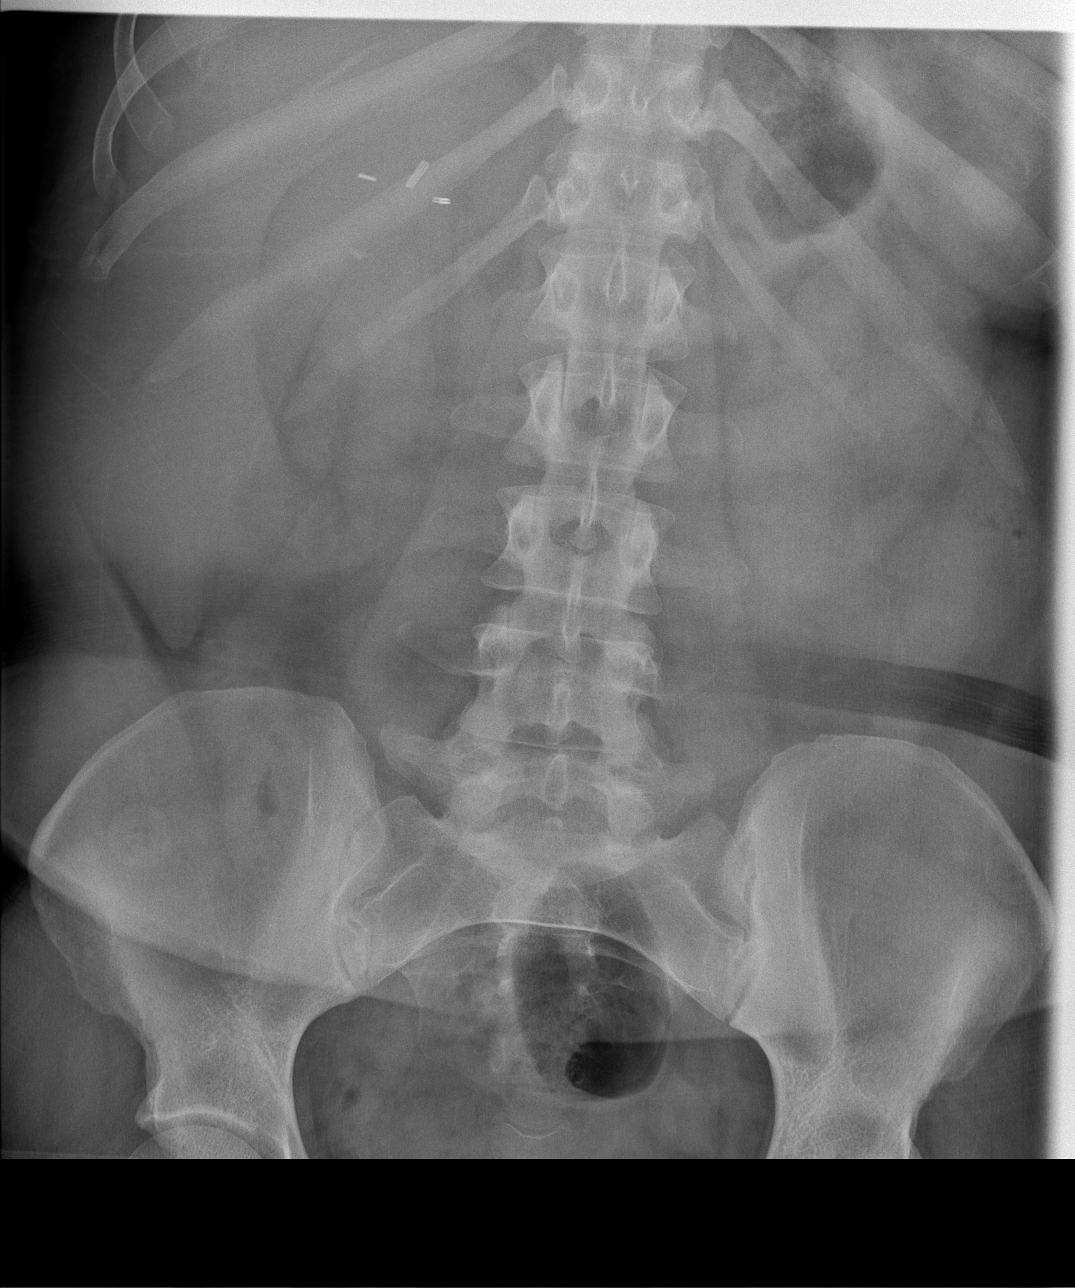

[3 of 3 positions shown; findings below may reference images not displayed]

ABDOMEN ACUTE WITH PA CHEST:

Comparison abdominal film 01/13/2007
Comparison chest x-ray 01/22/2007.

Normal heart size, mediastinal contours, pulmonary vascularity for technique.
Lungs clear.

Surgical clips right upper quadrant question cholecystectomy.
Paucity of bowel gas.
No definite bowel obstruction, bowel thickening, or bowel dilatation.
No free intraperitoneal air.
Bones unremarkable.
IMPRESSION: No acute abnormalities.

## 2010-07-16 ENCOUNTER — Encounter: Payer: Self-pay | Admitting: Cardiology

## 2010-07-16 ENCOUNTER — Encounter: Payer: Self-pay | Admitting: Orthopedic Surgery

## 2010-11-07 NOTE — Discharge Summary (Signed)
[name], [name]               ACCOUNT NO.:  [id number]   MEDICAL RECORD NO.:  [id number]          PATIENT TYPE:  INP   LOCATION:  5114                         FACILITY:  [facility]   PHYSICIAN:  Hillery Aldo, M.D.   DATE OF BIRTH:  1975-10-24   DATE OF ADMISSION:  01/03/2007  DATE OF DISCHARGE:  01/07/2007                               DISCHARGE SUMMARY   PRIMARY CARE PHYSICIAN:  Lonia Blood, M.D.   DISCHARGE DIAGNOSES:  1. Gluteal and left upper thigh abscesses, cultures positive for      Streptococcus agalactiae.  2. Chronic nausea and vomiting.  3. Microcytic anemia-sickle cell disease.  4. Thrombocytosis.  5. Uncontrolled diabetes.  6. Medical nonadherence  7. Hypokalemia.  8. Urinary tract infection.  9. Tobacco abuse.  10.Narcotics dependency with medication-seeking behavior.   DISCHARGE MEDICATIONS:  1. Augmentin 875 mg b.i.d. x7 days.  2. Glucotrol XL 10 mg b.i.d.  3. Hydrea 500 mg b.i.d.  4. Xanax 1 mg 3 times a day.  5. Percocet 10/325 one to two tablets q.4 h p.r.n. (#20 given at      discharge by prescription.)  6. Lantus 45 units subcutaneously in the morning, 95 units      subcutaneously in the evening.  7. Phenergan 25 mg p.o. or PR q.4-6 h p.r.n.  8. Lisinopril 5 mg daily.  9. Protonix 40 mg daily.  10.Sliding scale insulin, insulin resistant scale before meals or food      and nightly.  11.NovoLog 8 units before meals or food subcutaneously.   CONSULTATIONS:  Ardeth Sportsman, MD, general surgery.   PROCEDURES AND DIAGNOSTIC STUDIES:  1. Chest x-ray on January 04, 2007, showed low volumes with no acute      findings.  2. Acute abdominal series on January 06, 2007, showed low-volume chest      exam with no acute interval changes.  There is no obstruction,      ileus, or significant bowel dilatation.  There was no free air.  3. Discharge laboratory values:  Abscess cultures grew strep      Streptococcus agalactiae.  Sodium was 137, potassium 3.5, chloride     107, bicarb 23, BUN 6, creatinine 0.45, glucose 159.  White blood      cell count was 7.3, hemoglobin 8.4, hematocrit 26.9, platelets 683.   HOSPITAL COURSE:  #1.  GLUTEAL AND LEFT UPPER THIGH ABSCESS:  The patient was seen in  consultation with Dr. Michaell Cowing, of general surgery, who performed an  incision and drainage of the abscessed areas.  Abscess cultures were  sent off and subsequently grew Streptococcus agalactiae.  Because of  concerns for methicillin-resistant Staphylococcus aureus, the patient  was empirically treated with vancomycin initially.  Now that the final  culture results are back, she will be switched to p.o. Augmentin and  will complete a full 10-day course of therapy.  She is advised to keep  the area clean and dry and to wash it out with antibacterial soap daily.   #2.  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  Nevertheless,  she is somewhat intolerant to Reglan.  Acute abdominal series did not  reveal any evidence of ileus or obstruction.  Her nausea and vomiting  was managed with Phenergan.  Despite being kept n.p.o., the patient  often takes food brought in by her family.  She was relatively  nonadherent to our attempts to get her nausea and vomiting under  control.  Nevertheless, according to the nursing staff, she has not  vomited in the past 24 hours.   #3.  MICROCYTIC ANEMIA-SICKLE CELL DISEASE:  The patient's hemoglobin  was stable and at her usual baseline.  She continued on Hydrea.   #4.  THROMBOCYTOSIS:  The patient's platelet count is likely elevated to  reactive changes from infection.  This should be monitored over time for  resolution.   #5.  UNCONTROLLED DIABETES:  The patient was very noncompliant with diet  changes and low-carbohydrate, low-sugar meals.  She often ate extra  meals that were ordered for her children.  Her sugars were widely out of  control  during the initial part of her hospitalization.  Her Lantus was  titrated up.  She still has fairly elevated blood sugars.  Dietician did  see her and spoke with her about her diet and carbohydrate counting as  well as how eat a low-carbohydrate diet to best manage her diabetes.  Nevertheless, it is doubtful that she will be compliant discharge.  She  should follow up closely with a primary care physician for ongoing  surveillance, routine hemoglobin A1c testing and further adjustment of  her insulin dose.   #6.  URINARY TRACT INFECTION:  Unfortunately, cultures were never sent  off prior to antibiotic initiation.  Nevertheless, in addition to  vancomycin, Rocephin was added to cover any urinary pathogens.  She was  asymptomatic, and, therefore, no further treatment is deemed indicated.  If she develops symptoms, she can have a urinalysis and culture done as  an outpatient.   #7.  TOBACCO ABUSE:  The patient was counseled on cessation.   #8.  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  She  was dismissed from general surgery's practice in the past secondary to  manipulating prescriptions.  She is at high risk for substance abuse and  abuse of prescription narcotics.  Attempt to contact Dr. Mikeal Hawthorne to find  out what her last narcotics prescriptions was unsuccessful.  Therefore,  we are going to discharge her with a limited number of Percocet pills to  tide her over until she can see Dr. Mikeal Hawthorne.   DISPOSITION:  The patient is medically stable for discharge and be  discharged home today.     Hillery Aldo, M.D.  Electronically Signed    CR/MEDQ  D:  01/07/2007  T:  01/07/2007  Job:  [id number]   cc:   Lonia Blood, M.D.

## 2010-11-07 NOTE — Op Note (Signed)
[name], [name]               ACCOUNT NO.:  [id number]   MEDICAL RECORD NO.:  [id number]          PATIENT TYPE:  INP   LOCATION:  3009                         FACILITY:  [facility]   PHYSICIAN:  Ardeth Sportsman, MD     DATE OF BIRTH:  12-05-1975   DATE OF PROCEDURE:  DATE OF DISCHARGE:                               OPERATIVE REPORT   SURGEON:  Ardeth Sportsman, MD.   ASSISTANTS:  None.   PREOPERATIVE DIAGNOSES:  Thigh and buttock recurrent abscesses, history  of poorly controlled diabetes.   POSTOPERATIVE DIAGNOSES:  Thigh and buttock recurrent abscesses, history  of poorly controlled diabetes.   PROCEDURE PERFORMED:  Incision and drainage of left inner thigh abscess,  and skin wedge biopsy of left thigh inflamed abscess/nodules.   ANESTHESIA:  1. Conscious, monitored sedation with Fentanyl 100 mcg and 6 mg IV      Versed.  2. Lidocaine 1 % mixed equally with 0.25% bupivacaine in a field      block, a total of 10 mL used.   SPECIMENS:  Wound swabs for culture, and skin excisional tissue biopsy  x2.   DRAINS:  None.   ESTIMATED BLOOD LOSS:  Less than 2 mL.   COMPLICATIONS:  None apparent.   INDICATIONS:  Miss Heidel is a 35 year old female with poorly controlled  diabetes, who has a history of buttock and inner thigh recurrent skin  infections.  Given the numerous nodule, I recommended that the largest  one in her left inner thigh undergo incision and drainage.  The  technique was discussed, and the risks, benefits and alternatives were  discussed and she agreed to proceed with conscious sedation.   FINDINGS:  She did not really have a deeply indented abscess cavity, or  I did not get a large amount of purulence, but I got into somewhat of a  small cavity.  She had very fibrosed subcutaneous tissues.   DESCRIPTION OF PROCEDURE:  Informed consent was confirmed.  The patient  underwent preemptive analgesia and sedation, as noted above.  I did a 2-  cm incision over the  largest part of the mass.  I did not evacuate a  large amount of purulence, but did get some thinly seropurulent fluid.  I went ahead and swabbed the area.  Because it was rather hard and  indurated, I went ahead and did a wedge biopsy with a wedge resection to  help open the wound more  to allow it to better drain.  I sent this for pathology as well.  Pressure was held.  Hemostasis was good.  The patient was initially  uncomfortable.  By the end of the case, she was resting, in no acute  distress, and she tolerated the procedure relatively well.      Ardeth Sportsman, MD  Electronically Signed     SCG/MEDQ  D:  01/04/2007  T:  01/04/2007  Job:  [id number]

## 2010-11-07 NOTE — Consult Note (Signed)
NAMEGENESI, STEFANKO               ACCOUNT NO.:  [id number]   MEDICAL RECORD NO.:  [id number]          PATIENT TYPE:  INP   LOCATION:  5120                         FACILITY:  [facility]   PHYSICIAN:  Antonietta Breach, M.D.  DATE OF BIRTH:  [birth year]   DATE OF CONSULTATION:  01/24/2007  DATE OF DISCHARGE:  01/26/2007                                 CONSULTATION   REQUESTING PHYSICIAN:  Lonia Blood, M.D. of [facility].   REASON FOR CONSULTATION:  Severe anxiety.   HISTORY OF PRESENT ILLNESS:  Mrs. Taunya Goral is a 35 year old female  admitted to the [facility] on January 22, 2007, due to chest pain.   The patient has been experiencing approximately 2 weeks of increased  feeling on edge, muscle tension and excessive worry.  She states that  her mood is only depressed when her pain is very severe.  Otherwise, she  has intact interests, normal mood and constructive goals.  She has no  thoughts of harming herself or others.  She has no delusions or  hallucinations.  She has been requiring Xanax 1 mg t.i.d. to control her  feeling on edge.  She has no hallucinations or delusions.  She is  noncombative.  Her judgment is intact, her memory ability is intact, and  she is oriented to all spheres.  She is not showing any adverse Xanax  effects.   PAST PSYCHIATRIC HISTORY:  The patient attempted suicide when she was 35  years old.  She was sent to [facility] for a psychiatric  admission at that time.   She denies any history of decreased need for sleep or increased energy.  She has no history of hallucinations or delusions.   She does have a long-term history of excessive worry and feeling on edge  as well as muscle tension.  She also has had discrete short-lived  anxiety symptoms at times.  She was tried on Lexapro, which caused side  effects.  She was treated with Effexor; however, her prescription was  stopped in the past year when her psychiatrist passed away.   She  has been treated with Seroquel for severe anxiety.   FAMILY PSYCHIATRIC HISTORY:  None known.   SOCIAL HISTORY:  The patient is single.  She has eight children.  Occupation:  Medically retired.  She is on disability.  No illegal drugs  or alcohol.   PAST MEDICAL HISTORY:  1. Chronic pain with neuropathy.  2. Diabetes mellitus.  3. There is a history given by the patient of sickle cell anemia.      However, the review of past medical records includes a statement      that documentation from previous physicians has not sustained that      diagnosis.  The patient has listed and anemia in the medical      record, which is believed to be secondary to chronic heavy menses.   MEDICATIONS:  The MAR is reviewed.  The patient is on Xanax 1 mg t.i.d.,  Elavil mg q.h.s.   She has allergies to CIPROFLOXACIN, KETOROLAC, MORPHINE SULFATE, and  ONDANSETRON.   REVIEW OF SYSTEMS:  CONSTITUTIONAL:  Afebrile.  No weight loss.  HEAD:  No trauma.  EYES:  No visual changes.  EARS:  No hearing impairment.  NOSE:  No rhinorrhea.  MOUTH/THROAT:  No sore throat.  NEUROLOGIC:  Unremarkable.  PSYCHIATRIC:  As above.  The patient is having persistent  insomnia.  CARDIOVASCULAR:  No chest pain, palpitations.  RESPIRATORY:  No coughing or wheezing.  GASTROINTESTINAL:  No nausea, vomiting,  diarrhea.  GENITOURINARY:  No dysuria.  SKIN:  Unremarkable.  MUSCULOSKELETAL:  No deformities.  ENDOCRINE/METABOLIC:  Unremarkable.  HEMATOLOGIC/LYMPHATIC:  Anemia.   EXAMINATION:  VITAL SIGNS:  Temperature 97.4, pulse 96, respiration 20,  blood pressure 123/79, O2 saturation on room air 98%.  GENERAL APPEARANCE:  Ms. Ghosh is a young female appearing her  chronologic age of 37, sitting up on her hospital bed with good eye  contact.  She has no abnormal involuntary movements.  She is well-  groomed.  She has no abnormal body habitus.  OTHER MENTAL STATUS EXAM:  Mrs. Meisenheimer is alert.  Her attention span is  within normal  limits.  Her concentration is within normal limits.  Her  affect is mildly anxious.  Her mood is mildly anxious.  She is oriented  to all spheres.  Her memory is intact to immediate, recent and remote.  Her fund of knowledge and intelligence are within normal limits.  Her  speech involves normal rate and prosody without without dysarthria.  Thought process logical, coherent, goal-directed.  No looseness of  associations.  Language expression and comprehension are intact.  Calculations and abstraction are intact.  Thought content:  No thoughts  of harming herself, no thoughts of harming others, no delusions, no  hallucinations.  Insight is partial.  Judgment is intact.   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  The the patient does have likely a diagnosis of generalized anxiety  disorder.  She possibly has a diagnosis of panic disorder.  296.35,  major depressive disorder.  AXIS II:  Deferred.  AXIS III:  See general medical problems.  AXIS IV:  General medical, primary support group, economic.  AXIS V:  55.   Ms. Hoskinson is not at risk to harm herself or others.  She agrees to call  emergency services immediately for any thoughts of harming herself,  thoughts of harming others, or other psychiatric emergency symptoms.   The undersigned provided ego-supportive psychotherapy and education.  The indications, alternatives and adverse effects of the following were  discussed with the patient:  Effexor for antidepression and antianxiety,  including the risk of hypertension; trazodone for anti-insomnia as well  as augmenting the Effexor; Xanax for anti-acute anxiety, including the  risk of dependence and driving while drowsy.   The patient understands the above information and would like to proceed  as below with the goal of eliminating the need for Xanax.   RECOMMENDATIONS:  1. Would restart Effexor at 37.5 mg XR p.o. q.a.m. while monitoring      blood  pressure.  Would then increase the Effexor by 37.5 mg every 2-      3 days to the initial trial dose of 150 mg XR p.o. q.a.m. for      antianxiety and antidepression.  2. Would start trazodone is 25-50 mg p.o. q.h.s. p.r.n. insomnia.  If      the patient continues to require the trazodone, would put her on a  scheduled dose that is enough to eliminate insomnia but not greater      than 300 mg q.h.s.  3. Psychiatric outpatient follow-up can be provided at one of the      clinics attached to [facility], [facility] or [facility].  Would recommend that the patient      undergo cognitive-behavioral therapy combined with deep breathing      and progressive muscle relaxation.  This combination of Effexor and      psychotherapy could result in an elimination for her need for      Xanax.      Antonietta Breach, M.D.  Electronically Signed     JW/MEDQ  D:  01/26/2007  T:  01/27/2007  Job:  [id number]
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
## 2010-11-07 NOTE — H&P (Signed)
[name]               ACCOUNT NO.:  [id number]   MEDICAL RECORD NO.:  [id number]          PATIENT TYPE:  INP   LOCATION:  5120                         FACILITY:  [facility]   PHYSICIAN:  Lonia Blood, M.D.       DATE OF BIRTH:  1976-02-18   DATE OF ADMISSION:  01/22/2007  DATE OF DISCHARGE:                              HISTORY & PHYSICAL   PRIMARY CARE PHYSICIAN:  Dr. Mikeal Hawthorne.   CHIEF COMPLAINT:  Chest pain.   HISTORY OF PRESENT ILLNESS:  [name] is a 35 year old African-American  woman with history of uncontrolled diabetes mellitus who presents to  [facility] emergency room after about a day of significant chest pain on  the right side.  She reports the pain is worse when she takes a deep  breath.  She denies any coughing or shortness of breath.  The patient  has been having bouts of nausea, vomiting, abdominal pain, and she has  been reporting that this has been getting worse for her over the past  days.  She has noticed that ever since she left the hospital the  previous week, her CBGs at home and they are running about 400.  She  reports good compliance with her Lantus, as well as her diet.   PAST MEDICAL HISTORY:  1. Diabetes mellitus.  2. Neuropathy.  3. Patient reports history of sickle cell but the previous      documentations from physicians do not sustain diagnosis.  4. Anemia felt to be secondary to chronic heavy menses.   HOME MEDICATIONS:  1. Lantus 90 units twice a day.  2. Humalog sliding scale.  3. Glipizide 10 mg twice a day.  4. Percocet 10/325 as needed for pain.  5. Xanax 1 mg 3 times a day.  6. Lisinopril 5 mg daily.  7. Protonix 40 mg daily.   SOCIAL HISTORY:  The patient is single.  She has eight children.  She is  on disability.  She does smoke cigarettes, does not drink alcohol.   FAMILY HISTORY:  Family history is positive for diabetes   ALLERGIES:  The patient is allergic to CIPROFLOXACIN, TORADOL, MORPHINE  and ZOFRAN.   REVIEW OF  SYSTEMS:  As per HPI.  All other systems are negative.   PHYSICAL EXAMINATION:  VITAL SIGNS:  Upon admission temperature 98.2,  blood pressure 143/89, pulse 125, respirations 22, saturation 99% on  room air.  GENERAL APPEARANCE:  Well-developed African-American woman, obese,  sitting on the stretcher.  Oriented to place, person, time.  HEENT:  Head:  Normocephalic, atraumatic.  Eyes:  Pupils equal, round  and reactive to light and accommodation extraocular movements intact  Throat:  Clear.  NECK: Supple.  No JVD.  No carotid bruits.  CHEST:  Clear to auscultation, without wheezes, rhonchi or crackles.  HEART:  Regular rate and rhythm without murmurs, rubs or gallops.  ABDOMEN:  The patient's abdomen is soft.  There is diffuse tenderness to  palpation.  Bowel sounds are present.  PELVIC:  In the left groin there is a 1-cm area of induration, no  appreciable  discharge.  No fluctuation.  There are bilateral chronic  skin changes in the groin compatible with intertrigo.  EXTREMITIES:  On the left medial side of the thigh there is a 1-cm area  of an incised abscess that does not have any drainage currently.  Lower  extremities have +1 edema.  NEUROLOGICAL EXAM:  Cranial nerves II-XII are intact.  Strength is 5/5.  Sensation decreased in lower extremities.   LABORATORY DATA:  Laboratory on admission sodium 130, potassium 5,  chloride 95, bicarb 23, BUN 8, creatinine 0.5, glucose 340, calcium  10.7.  Protein 9, albumin 4.2, AST 22, ALT 16, total bilirubin 0.6,  alkaline phosphatase 87.  A recent hemoglobin A1c was 10.  White blood  cell count is 19, absolute neutrophil count is 17, hemoglobin is 12,  platelet count is 310, reticulocyte count is 135 which is within normal  limits.  Chest x-ray shows no acute disease.   ASSESSMENT/PLAN:  1. Chest pain. I do suspect that the patient's chest pain is      musculoskeletal in nature.  I do not find evidence that would point      towards an acute  coronary syndrome, pneumonia or pulmonary embolus.  2. Nausea, vomiting and abdominal pain.  This is definitely a chronic      problem for the patient and we will try to approach it      symptomatically.  3. Uncontrolled diabetes mellitus type 2.  Based on the current      history, I have no clear explanation why the patient's diabetes is      not under control.  I will start her on Lantus sliding scale and      observe the numbers in the hospital.  We will send a urine for      culture to make sure that we are not dealing with a urinary tract      infection that is throwing the diabetes out of control.  4. Deep vein thrombosis prophylaxis.  We will begin done by using      Lovenox this admission.      Lonia Blood, M.D.  Electronically Signed     SL/MEDQ  D:  01/22/2007  T:  01/23/2007  Job:  [id number]   cc:   Lonia Blood, M.D.

## 2010-11-07 NOTE — Discharge Summary (Signed)
NAMECAILAH, REACH               ACCOUNT NO.:  [id number]   MEDICAL RECORD NO.:  [id number]          PATIENT TYPE:  INP   LOCATION:  6738                         FACILITY:  [facility]   PHYSICIAN:  Ladell Pier, M.D.   DATE OF BIRTH:  1976/02/06   DATE OF ADMISSION:  01/13/2007  DATE OF DISCHARGE:  01/19/2007                               DISCHARGE SUMMARY   DISCHARGE DIAGNOSES:  1. Diabetic gastroparesis with nausea and vomiting.  2. Diabetes.  3. Question of a history of sickle cell or sickle cell trait.  4. Multiple admissions for intractable nausea and vomiting.  5. History of anxiety.  6. History of boil treated by incision and drainage a few weeks ago by      surgery with some healing of the skin in the area in the groin      area.  7. Drug-seeking behavior.  8. Anemia.   DISCHARGE MEDICATIONS:  1. Clindamycin 600 mg twice daily for 7 days.  2. Glucotrol XL 10 mg twice daily.  3. Hydroxyurea 500 mg twice daily.  4. Xanax 1 mg 3 times daily as needed.  5. Percocet 10/325 one to two tablets every 6 hours as needed.  6. Lantus 45 units in the morning, Lantus 95 units in the evening.  7. Phenergan 25 mg as needed.  8. Lisinopril 5 mg daily.  9. Protonix 40 mg daily.  10.NovoLog 8 units before meals.  The patient received prescription      refill for Percocet #40 with 0 refills.   FOLLOW-UP APPOINTMENTS:  The patient to follow up with Dr. Mikeal Hawthorne this  week.   PROCEDURES:  None.   CONSULTANT:  None.   HISTORY OF PRESENT ILLNESS:  The patient is a 35 year old, African-  American female admitted with nausea and vomiting and question of  diarrhea.  She came to the emergency room stating that her blood sugars  were elevated.  The patient at the time, based on her admission note,  did not have her Lantus and had been substituting Humalog for her Lantus  dosing.   The patient's past medical history, family history, social history,  medications, allergies, review of systems  per admission H&P.   PHYSICAL EXAMINATION ON DISCHARGE:  Temperature 98.7, pulse of 92,  respirations 18, blood pressure 133/79, pulse oximetry 99% on room air.  Blood sugars 268-350.  HEENT:  Head is normocephalic, atraumatic.  Pupils reactive to light.  Throat without erythema.  CARDIOVASCULAR:  Regular rhythm.  LUNGS:  Clear bilaterally.  ABDOMEN:  Positive bowel sounds.  EXTREMITIES:  Without edema.   HOSPITAL COURSE:  1. Diet, nausea and vomiting secondary to diabetic gastroparesis:  The      patient was admitted with this diagnosis; however, the nursing      staff was not sure whether she was really throwing up or not as      they never witnessed any emesis, and the emesis that she did      present for their review was question of whether it was water with      different things added.  The  patient's blood sugar continued to be      elevated.  During the initial hospitalization, she was n.p.o., but      per nursing, the family members constantly brought her in high-      caloric food that they witnessed her eating.  So, since the patient      was noncompliant, she was told she will be discharged, and she will      follow up with her primary care physician for possibly referral to      GI for her gastroparesis.  During her next hospitalization, it      would be fair to give her a sitter so vomiting can be witnessed and      to see if her blood sugar was really high or she was indeed eating      food brought in by her family.  2. Diarrhea:  No diarrhea was noted by staff.  She did have stool that      was sent for C diff and white blood cell, and they were both      normal.  3. Diabetes, uncontrolled:  The patient does have diabetes that is not      controlled secondary to her poor eating choices.  We will put her      back on her home dose of medications, and she is to follow up with      Dr. Mikeal Hawthorne.  4. Folliculitis:  She does have some folliculitis in the point area.       Gave her prescription antibiotic, clindamycin, for 7 days.  5. Anemia:  Her blood count did drop while he was inpatient.  She      states that she does have heavy menses.  She received 2 units of      blood.  She will follow up outpatient for further workup of her of      anemia.  Per the patient, she has question of sickle cell trait      versus sickle cell disease   DISCHARGE LABORATORY DATA:  WBC 8.3, hemoglobin 9.9, platelet 102.  PTT  38.  PT 14.1, INR 1.1.  Sodium 137, potassium 3.6, chloride 105, CO2 22,  glucose 317, BUN 3, creatinine 0.5.  C diff toxin negative. Lactoferrin  negative.      Ladell Pier, M.D.  Electronically Signed     NJ/MEDQ  D:  01/19/2007  T:  01/20/2007  Job:  [id number]

## 2010-11-07 NOTE — H&P (Signed)
NAMEOLIVIAH, AGOSTINI               ACCOUNT NO.:  [id number]   MEDICAL RECORD NO.:  [id number]          PATIENT TYPE:  EMS   LOCATION:  MAJO                         FACILITY:  [facility]   PHYSICIAN:  Marcellus Scott, MD     DATE OF BIRTH:  Apr 15, 1976   DATE OF ADMISSION:  01/03/2007  DATE OF DISCHARGE:                              HISTORY & PHYSICAL   PRIMARY CARE PHYSICIAN:  Lonia Blood, M.D.   CHIEF COMPLAINT:  Uncontrolled diabetes and boils on her upper thighs  and buttocks.   HISTORY OF PRESENT ILLNESS:  Ms. Langton is a pleasant 35 year old African-  American female patient with extensive past medical history as indicated  below. For approximately 1 year, the patient says that she has been  having uncontrolled diabetes with blood sugars, which were initially in  the 200's. However, over the last 2 months, the blood sugars have been  ranging between 300 mg per deciliter to 450 mg per deciliter. The  patient has up-titrated her sliding scale Humalog insulin to current  doses and Lantus was increased yesterday from 75 units q.h.s. to 75 at  night and 25 in the morning. The patient also has had history of  recurrent boils/abscesses on her buttocks for the last 4 years, which  are periodic in nature. She has again, started having them for the last  few weeks and currently has 1 of those boils on the upper medial aspect  of her left thigh, which initially started off as a small bump, which  progressively increased and was pointing. She took a needle and  punctured it with some white foul smelling discharge. The patient also  has a vaginal white non-pruritic discharge. The patient claims that she  has low-grade fevers with chills. She also has polydipsia and  intermittent polyuria. For these complaints, the patient discussed with  her primary care physician yesterday and was asked to come in for  admission yesterday, however, due to her home problems, she was unable  to make it yesterday  and has presented today for admission.   PAST MEDICAL HISTORY:  1. Type 2 diabetes of 9 years duration with associated gastroparesis      and neuropathy.  2. Questionable hypertension.  3. Dyslipidemia.  4. History of sickle cell trait with painful crisis, once every 2 to 3      months.  5. Ruptured disk in the mid back area.  6. HIV testing done x2 this year, once in March and once 3 weeks ago,      said to be negative, according to the patient.   PAST SURGICAL HISTORY:  1. Cesarean section x3.  2. Dental extractions.  3. Status post cholecystectomy.   ALLERGIES:  MORPHINE, CIPROFLOXACIN, ZOFRAN (all give her hives.)   MEDICATIONS:  1. Glucotrol XL 10 mg, 1 p.o. b.i.d.  2. Humalog 100 units subcutaneous, which she says that she takes      anywhere between 3 times a day to 4 times a day.  3. Hydrea 500 mg p.o. twice daily.  4. Xanax 1 mg p.o. t.i.d. p.r.n.  5. Percocet  10/325 2 tablets p.o. q.4 hours p.r.n.  6. Lantus 75 units q.h.s. and 25 units daily, which started yesterday      or today.  7. Lisinopril, unclear dosage.  8. Protonix.  9. Phenergan suppositories and orally, unclear dosages.   FAMILY HISTORY:  1. Grandmother with history of diabetes and hypertension.  2. Maternal uncle with history of congestive heart failure, diabetes,      and hypertension.  3. Maternal aunt with history of diabetes, hypertension, and renal      stones.   SOCIAL HISTORY:  The patient is separated from her spouse. She has 7  children, ages ranging from 3 years to 16 years. She is on disability  for the last 13 years. Prior to that, she worked at [employer]. She is  a current smoker, 8 cigarettes a day for the last 2 years. No history of  alcohol or drug abuse.   REVIEW OF SYSTEMS:  Over 10 systems reviewed and pertinent for:  1. Intermittent headaches and sore throat.  2. Questionable exertional dyspnea.  3. Nausea with intermittent vomiting and intermittent abdominal pain.  4.  Constipation.   PHYSICAL EXAMINATION:  GENERAL:  Ms. Ortner is moderately build and  nourished female in no obvious distress.  VITAL SIGNS:  Temperature 98.8, blood pressure 143/78, pulse 110 per  minute and regular. Respiratory rate 18. Saturating at 99% on room air.  HEENT:  Normocephalic and atraumatic. Pupils are equal, round, and  reactive to light and accommodation. Oral cavity with no erythema or  thrush. Missing some teeth. Apart from submental submandibular less than  0.5cm nontender, mobile few lymph nodes.  NECK:  No JVD, carotid bruit, or goiter. Supple.  GENERAL: 2-3 less than 0.5 cm, non-tender, mobile submental,  submandibular lymph nodes. No other lymphadenopathy.  LUNGS:  Clear to auscultation bilaterally.  CARDIOVASCULAR:  First and second heart sounds heard. No 3rd or 4th  heart sounds or murmurs, rubs, gallops, or clicks.  ABDOMEN:  Obese. Soft. No organomegaly or mass. Bowel sounds are  normally appreciated. The patient has multiple scars of healed ulcers or  abscesses on the lower anterior abdominal wall.  NEUROLOGIC:  The patient is awake, alert, oriented x3 with no focal  neurological deficits.  SKIN:  The patient has approximately a 3 to 4 cm diameter tender  swelling in the medial aspect of the left upper thigh with questionable  pointing of pus and slight warmth. She also has scars of multiple healed  ulcers/abscesses on her buttocks and 1 maybe with a small pointing on  the left buttock. Examined with patients female nurse in room.  EXTREMITIES:  No clubbing, cyanosis, or edema. Peripheral pulses are  symmetrically felt.   LABORATORY DATA:  Urinalysis with many bacteria but only 3 to 6 white  blood cells and negative for nitrites, greater than 1,000 mg per  deciliter of glucose, no protein. CBC with hemoglobin of 8.3, hematocrit  26.7, MCV 72. White blood cell count 11.2, platelets 738,000. I-STAT,  creatinine 0.4, venous pH of 7.3, pCO2 is 35.3, bicarb  17.5. Sodium 138,  potassium 3.4, chloride 107, glucose 401, BUN less than 3. Hemoglobin on  December 25, 2006 was 9.2.   ASSESSMENT/PLAN:  1. Uncontrolled type 2 diabetes. Will admit the patient to the      hospital. Will check hemoglobin A1C and CBG's t.i.d. a.c., and at      bedtime. Will hydrate with IV normal saline. Will place the patient  on increased dose of Lantus of 25 units daily and 75 units q.h.s.      Will also place the patient on NovoLog sliding scale insulin with      HS cover and on mealtime Novolog insulin. Will obtain inpatient      diabetes instructions. The patient will probably require higher      dose of insulin, to be adjusted based on her blood sugar      monitoring's. Will also continue the patient's home Glucotrol.  2. Gluteal/left upper thigh abscesses. Will obtain wound cultures and      place the patient on IV Vancomycin covering empirically for MRSA.      Will also obtain a surgical consultation. Wound care.  3. Microcytic anemia/history of sickle cell trait. Will follow CBC's.  4. Thrombocytosis, questionable for reactive. Will follow CBC's.  5. Hypokalemia. Will repeat p.o. and IV and monitor basic metabolic      panel.  6. History of tobacco abuse. Will obtain tobacco cessation counseling.      The patient is refusing nicotine patch at this time.  7. Hypertension: controlled. For Lisinopril.      Marcellus Scott, MD  Electronically Signed     AH/MEDQ  D:  01/03/2007  T:  01/04/2007  Job:  [id number]   cc:   Lonia Blood, M.D.

## 2010-11-07 NOTE — Consult Note (Signed)
[name], COPPIN NO.:  [id number]   MEDICAL RECORD NO.:  [id number]          PATIENT TYPE:  INP   LOCATION:  1848                         FACILITY:  [facility]   PHYSICIAN:  Altha Harm, MDDATE OF BIRTH:  [birth year]   DATE OF CONSULTATION:  04/15/2007  DATE OF DISCHARGE:                                 CONSULTATION   CHIEF COMPLAINT:  Emesis and weakness and pain.   This is a patient who is very well known to this service with chronic  pain, diabetes type 2 and emesis.  The patient presented to the  emergency room today with complaints of pain and emesis for  approximately 6 to 7 days.  The patient states that she is unable to  take anything in by mouth and everything that she takes in results in  emesis.  The patient denies any fever or chills.  She denies any  diarrhea.   Please note that during the time I was in the emergency room, the  patient was seen up walking around in no acute distress and that the  patient was not observed vomiting, however, produced substance that  she called emesis in the bowl.  Please note that in review of the  patient's past medical records, she is well known to substitute  substances for her emesis in addition to having pain seeking behaviors.   Past medical history is significant for diabetes type 2 uncontrolled,  microcytic anemia, medical noncompliance, diabetic neuropathy, tobacco  use disorder, mild hypertension.  Please note that the patient again  stated today that she has a history of sickle cell trait, however, the  patient has been told on numerous occasions based upon electrophoresis  that she does not have the sickle cell trait.  She has been told by  myself personally on two occasions that she does not have the sickle  cell trait, however, the patient presents to the emergency room stating  that her sickle cell trait is the cause for her chronic pain.   CURRENT MEDICATIONS:  Include:  1. Glucotrol 10 mg  XL.  2. Percocet 10/325 one to two tabs p.o. q.4 h. p.r.n. pain.  3. Lisinopril 5 mg p.o. daily.  4. Metformin 500 mg p.o. b.i.d.  5. Phenergan 12.5 to 25 mg p.o. q.4 h. p.r.n.   ALLERGIES:  TO CIPROFLOXACIN, MORPHINE AND ZOFRAN.   SOCIAL HISTORY:  The patient is single.  She has 8 children.  She is on  disability.  She smokes cigarettes.  She does not drink alcohol and she  denies any elicit drug use.   FAMILY HISTORY:  Significant for diabetes type 2.   REVIEW OF SYSTEMS:  Patient is indiscriminate in review of systems.  She  answers yes to all questions asked for review of systems.  Thus, her  review of systems is completely unreliable.   Laboratory studies done in the emergency room shows a sodium of 138,  potassium of 3.6, chloride 103, bicarb 25, BUN 5, creatinine 0.53,  glucose 273.  White blood cell count 9.2, hemoglobin 10.7, hematocrit  34.4, platelet count 526.  A urinalysis performed shows no elements  consistent with urinary tract infection.  Lipase is 23 and a chest x-ray  is negative for any acute pulmonary process.   PHYSICAL EXAMINATION:  GENERAL:  The patient is ambulating without  difficulty.  VITAL SIGNS:  Blood pressure 136/72, heart rate 88, respiratory rate 16,  oxygen saturation 97% on room air and the patient is afebrile with a  temperature of 98.1 orally.  HEENT:  The patient is normocephalic, atraumatic.  Pupils are equally  round and reactive to light and accommodation.  Extraocular movements  are intact.  Fundi are benign.  Tympanic membranes are translucent  bilaterally with good landmarks.  Oropharynx is moist, no exudate,  erythema or lesions noted.  NECK:  Supple.  Trachea is midline.  No masses, no thyromegaly, no JVD,  no carotid bruits.  RESPIRATORY:  The patient has normal respiratory effort.  Equal  excursion bilaterally.  No rales, rhonchi or wheezing are noted.  CARDIOVASCULAR:  She has a normal S1 and S2.  No murmurs, rubs or  gallops  are noted.  PMI is nondisplaced.  No heaves or thrills on  palpation.  ABDOMEN:  Patient's abdomen is soft.  The patient complains of diffuse  tenderness to palpation.  There is no guarding, no rebound.  She has  normal active bowel sounds.  EXTREMITIES:  The patient has healed scars from previous intertrigo and  small abscesses.  However, presently she has no active lesions noted.  Skin is warm and dry.  NEUROLOGIC:  The patient has no focal neurological deficits.  Cranial  nerves II-XII are grossly intact.  Strength is functional as the patient  is up ambulating without any difficulty, however, the patient refuses to  participate in a formal strength examination.  DTRs are 2+ bilateral in  the upper and lower extremities.  Sensation is intact to light touch and  proprioception.  PSYCHIATRIC:  The patient is alert and oriented x3.  She has very  histrionic behaviors.  The patient has good recent and remote recall.  Her cognition is intact, however, there is certainly some impairment of  insight into her condition given her fabrication about sickle cell  trait.   ASSESSMENT/PLAN:  While this patient may be reporting emesis, she has no  metabolic derangements at this time to support any need for inpatient  admission.  The patient does have chronic pain but  the patient is  functional with her chronic pain, thus, she does not require admission  for her pain either.  In terms of her diabetes, the patient' blood  sugar, while elevated, is not excessively elevated to the point of  placing her at risk.  The patient continues on her usual medications.  She should have no difficulty with controlling her blood sugars and the  patient shows no signs of infection at this time.  She can follow up  with her primary care physician as an outpatient.  At this time, I see  no reason to admit the patient to the medical service as she has no  complaints requiring inpatient management at this time.   My  recommendation is the patient be discharged with enough pain  medications to get her to her primary care physician and antiemetics to  provide some comfort for her emesis.      Altha Harm, MD  Electronically Signed     MAM/MEDQ  D:  04/16/2007  T:  04/16/2007  Job:  [id number]

## 2010-11-07 NOTE — Discharge Summary (Signed)
NAMEJANNA, OAK               ACCOUNT NO.:  [id number]   MEDICAL RECORD NO.:  [id number]          PATIENT TYPE:  INP   LOCATION:  5120                         FACILITY:  [facility]   PHYSICIAN:  Lonia Blood, M.D.       DATE OF BIRTH:  July 28, 1975   DATE OF ADMISSION:  01/22/2007  DATE OF DISCHARGE:  01/26/2007                               DISCHARGE SUMMARY   PRIMARY CARE PHYSICIAN:  Dr. Lonia Blood.   DISCHARGE DIAGNOSES:  1. Uncontrolled diabetes mellitus type 2.  2. Nausea, vomiting of unclear etiology, resolved.  3. Escherichia coli urinary tract infection.  4. Leukocytosis on admission, resolved.  5. Microcytic anemia most likely secondary to menorhagia.  6. Medical noncompliance.  7. History of chronic anxiety.  8. Complaint of chest pain on admission of unclear etiology, resolved.  9. Diabetic neuropathy.  10.Tobacco abuse.  11.Mild hypertension.  12.Enterococcus urinary tract infection.   DISCHARGE MEDICATIONS:  1. Lantus 100 units twice a day.  2. Metformin 500 mg twice a day.  3. Elavil 50 mg at bedtime.  4. Xanax 1 mg three times a day.  5. Percocet 10/325 1 tablet every 4 hours as needed.  6. Effexor 37.5 mg daily.  7. Nicotine patch 21 mg daily.  8. Lisinopril 5 mg daily.  9. Protonix 40 mg daily,  10.NovoLog 8 units three times a day before each meal.  11.Phenergan 25 mg every 6 hours as needed for nausea.  12.Amoxicillin 500 mg twice a day for 5 days.   CONDITION ON DISCHARGE:  Ms. Magadan was discharged in fair condition.  At  the time of discharge the patient was instructed to follow a diabetic  diet and to see Dr. Mikeal Hawthorne as previously scheduled on January 29, 2007.   PROCEDURES DURING THIS ADMISSION:  No procedures were done.   CONSULTATION:  The patient was seen in consultation with Dr. Antonietta Breach from Psychiatry.   HISTORY AND PHYSICAL:  For admission history and physical refer to  dictated H&P done by Dr. Lavera Guise,  January 22, 2007.   HOSPITAL  COURSE:  1. Urinary tract infection.  Ms. Shelburne presented with nausea,      vomiting, leukocytosis and a dirty urine.  Urine culture grew      Escherichia coli and Enterococcus.  The patient was treated with      intravenous Rocephin which was switched to oral amoxicillin when      the final results of the culture were obtained.  The patient's      leukocytosis improved with white blood cell count at the time of      discharge being 10,000.  The patient's nausea and vomiting improved      and she was able to tolerate a regular diet.  2. Uncontrolled diabetes mellitus type 2.  Upon admission, Ms. Lohn      plasma glucose was in the range of 600's.  Ms. Gorsline was started on      Lantus sliding scale insulin and diabetic diet in the hospital.      The nursing staff noted that the  patient's family was bringing      outside food as well as regular sodas.  When that was brought to my      attention I had a long discussion with the patient educating her      about the importance of good diabetes control.  On January 24 2007,      we have proceeded with a strict diet enforcement, no further soda      sand surprisingly by January 25, 2007 this patient's CBG was in the      150's.  I have had another long discussion with the patient      illustrated the fact that her diabetes could get under control if      she would give a decent effort.  Ms. Carbo voiced understanding but      on January 26, 2007 as she was about to be discharged she started      again eating outside food brought by the family and drinking      regular sodas.  3. Chronic anxiety disorder.  Ms. Givhan reported to me that she has      been on disability due to anxiety.  I have asked Dr. Antonietta Breach from Psychiatry to dilate the patient and he concurred      with the diagnosis of anxiety disorder and recommended Effexor      together with the patient's Alprazolam that she was receiving in      the outpatient setting.  4.  Chronic pain.  Ms. Asante reports that she has been in constant      pain.  She actually throughout the admission has complained of      various pains in the chest, in the back, in the lower extremities,      in her abdomen, and has been constantly asking for Dilaudid      intravenously.  The patient has been educated about the importance      of a primary care physician and to avoid doctor shopping.  The      patient was also educated about the fact that her chronic pains      are not life-threatening.  Ms. Wanner has brought up to my attention      that she has sickle cell disease.  A sickle cell screen was      negative.  Looking at old records every time a sickle cell screen      was done was negative.  When the patient was confronted with this      she did not bring up to sickle cell diagnosis again.  Ms. Gleghorn was      discharged with 3 days supply of Percocet and she was told to      follow up with her primary care physician, Dr. Mikeal Hawthorne, for further      discussions in regard to the long-term prescription of opiates.      Through this admission, I had a long discussion with Ms. Beggs      about her preoccupation with pain and opiates.  I have voiced my      concern that someone who so preoccupied about obtaining narcotics      may have a hard time caring for her numerous children.  Ms. Wickard      assured me that her children are cared for by her mother and      husband.  As I have concerns about this  patient's ability to care      properly for her children, I have reported Ms. Parran to the [employer].      Lonia Blood, M.D.  Electronically Signed     SL/MEDQ  D:  02/06/2007  T:  02/07/2007  Job:  [id number]   cc:   Lonia Blood, M.D.

## 2010-11-07 NOTE — H&P (Signed)
[name], [name] NO.:  [id number]   MEDICAL RECORD NO.:  [id number]          PATIENT TYPE:  INP   LOCATION:  6738                         FACILITY:  [facility]   PHYSICIAN:  Altha Harm, MDDATE OF BIRTH:  Jun 07, 1976   DATE OF ADMISSION:  01/13/2007  DATE OF DISCHARGE:                              HISTORY & PHYSICAL   CHIEF COMPLAINT:  Nausea and vomiting.   HISTORY OF PRESENT ILLNESS:  This is a 35 year old African-American  female with a history of diabetes type 2, very poorly controlled, who  was just one week ago discharged from the hospital with multiple medical  problems which were all resolved.  The patient presents to the emergency  room today stating that her [name] sugars have been high and that this  morning she started having vomiting which was preceded by nausea  yesterday.  In further speaking to the patient, apparently the patient  does not have her Lantus and has been substituting Humalog for her  Lantus dosing, thus [name] sugars have been out of control.  Please note  this patient also claims that she has sickle cell anemia, however, the  sickle cell screens performed here at [facility] have both been  negative.  When further questioned by the physician, she states that her  sickle cell was diagnosed at [facility] by Dr. Amelia [name],  however, this is not the only thing causing her pain and that Lyrica  does not control her diabetic neuropathy, that she needs narcotics.  Please note that in prior discharge summaries, the patient has been  described as having drug-seeking behaviors.  The patient denies any  fevers or chills.  She denies any diarrhea.  The patient was treated for  an abscess on the leg which was treated by surgery and based upon  sensitivities, treatment with Augmentin.  The patient states that she  also has another boil formation in the genital area.   PAST MEDICAL HISTORY:  Her past medical history is  significant for:  1. Diabetes type 2.  2. Question of a history of sickle cell or sickle cell trait.  3. Multiple admissions for intractable nausea and vomiting, possible      diabetic gastroparesis.  4. History of anxiety.  5. History of a boil treated by incision and drainage approximately      two weeks ago.   FAMILY HISTORY:  Family history is significant for diabetes.   SOCIAL HISTORY:  The patient resides with her children.  She denies  tobacco, alcohol or drug use.   ALLERGIES:  ALLERGIES ARE TO CIPROFLOXACIN, KETOROLAC, MORPHINE SULFATE  AND ZOFRAN.   MEDICATIONS:  Current medications include the following:  1. Glucotrol XL 10 mg p.o. b.i.d.  2. Hydroxyurea 500 mg p.o. b.i.d.  3. Xanax 1 mg p.o. t.i.d.  4. Lantus 45 units q.a.m., 95 units q.p.m., however, the patient has      not been compliant with Lantus use.  5. Phenergan 25 mg p.o. or p.r. q.6h. p.r.n.  6. Lisinopril 5 mg p.o. daily.  7. Protonix 40 mg p.o. daily.  8.  Insulin on a resistance sliding scale.  9. NovoLog 8 units with each meal for meal coverage.   PRIMARY CARE PHYSICIAN:  Dr. Lonia [name].   REVIEW OF SYSTEMS:  Fourteen systems were reviewed and all systems are  negative except as noted in the HPI.   EMERGENCY ROOM EVALUATION:  The patient presented to the emergency room.  Vital signs were a temperature of 99.5.  Heart rate 99.  Respirations  16.  [name] pressure of 138/88.   LABORATORY DATA:  Laboratory studies done in the emergency room showed a  sodium of 133, potassium of 4.1, chloride of 98, bicarb of 23, BUN of 5,  creatinine of 0.55.  Liver enzymes were within normal limits.  A WBC  count of 9.2, hemoglobin of 10.4, hematocrit of 33.6, and a platelet  count of 216,000.  [name] glucose of 367.   A urinalysis showed urine glucose greater than 1000, a urine specific  gravity of 1.042, urine pH of 6.5, large hemoglobin in the urine,  negative for nitrites, negative for leukocyte esterase, a few  epithelial  cells and 0 to 2 WBCs, a few bacteria.   PHYSICAL EXAMINATION:  GENERAL:  The patient is laying in bed in no  acute distress.  VITAL SIGNS:  [name] pressure of 142/88.  Temperature of 99.5.  Respiratory rate 16.  O2 sat is 100% on room air.  Heart rate 87.  HEENT EXAMINATION:  The patient is normocephalic, atraumatic.  Pupils  are equally round and reactive to light and accommodation.  Extraocular  movements are intact.  Tympanic membranes are translucent bilaterally  with good landmarks.  Oropharynx is moist with no exudates, erythema or  lesions are noted.  NECK EXAMINATION:  Trachea is midline, no masses, no thyromegaly, no  JVD, no carotid bruits.  CARDIOVASCULAR:  The patient is mildly tachycardic.  She has a normal S1  and S2.  No murmurs, rubs or gallops are noted.  PMI is nondisplaced.  No heaves or thrills on palpation.  ABDOMEN:  Obese, soft, mild diffuse tenderness, no masses, no  hepatosplenomegaly noted.  LUNGS:  Clear to auscultation, no wheezing or rhonchi noted.  She has  got equal excursion bilaterally.  LYMPH NODE SURVEY:  There is no cervical or axillary or inguinal  lymphadenopathy.  SKIN:  The patient's skin is warm and dry.  She has some healed scars on  the thigh.  The patient has a small area of what appears to be a  beginning boil on the perineal area, however, there is no induration or  fluctuance noted.  NEUROLOGIC:  Cranial nerves II through XII are grossly intact.  No focal  neurological deficits.  PSYCHIATRIC:  The patient is alert and oriented times three.  The  patient has questionable insight into her disease, but appears to have  normal cognition and her recent and remote recall are intact.   ASSESSMENT/PLAN:  1. This is a patient with intractable nausea and vomiting, more than      likely due to markedly elevated [name] sugars.  The patient will be      given intravenous hydration and her diabetic regimen reinstated      with  subcutaneous insulin.  When the patient is able to tolerate      p.o., she will be placed on her usual medications.  2. Chronic pain:  The patient appears to be drug-seeking in her      nature, first stating that she has sickle cell.  However,  when      challenged about the diagnosis, she then states that her pain is      from chronic pain associated with diabetic neuropathy which is      poorly controlled by Lyrica.  I have asked that the patient's      records be sent over so that we can once and for all establish if      there is any prior diagnosis of sickle cell.  In the meantime, the      patient has been unable to take p.o. and the patient will be given      a Duragesic patch starting at 25 mcg per hour      and Dilaudid used for breakthrough pain.  As soon as the patient      can tolerate p.o., she will be put back on oral pain medications      for pain control.  We will continue to monitor the patient and the      patient will get Phenergan to be used for nausea and vomiting.      Altha Harm, MD  Electronically Signed     MAM/MEDQ  D:  01/13/2007  T:  01/14/2007  Job:  [id number]   cc:   [name], M.D.

## 2010-11-07 NOTE — Consult Note (Signed)
[name], [name]               ACCOUNT NO.:  [id number]   MEDICAL RECORD NO.:  [id number]          PATIENT TYPE:  INP   LOCATION:  3009                         FACILITY:  [facility]   PHYSICIAN:  Ardeth Sportsman, MD     DATE OF BIRTH:  08-16-1975   DATE OF CONSULTATION:  DATE OF DISCHARGE:                                 CONSULTATION   PRIMARY CARE PHYSICIAN:  Lonia Blood   REQUESTING PHYSICIAN/CONSULTING PHYSICIAN:  Dr. Marcellus Scott with  [facility].   REASON FOR CONSULTATION:  Recurrent boils in upper thighs and buttocks  with history of poorly controlled diabetes.   HISTORY OF PRESENT ILLNESS:  [name] is a 35 year old female who has  been in the emergency room for numerous times for numerous infections  including ear infections, urinary infections, and has chronic skin  problems on her buttocks and her thighs status post I&D of numerous  abscesses in the past.  She has already developed allergies to  ciprofloxacin which gives her hives.   She has poorly controlled diabetes with her blood sugars running in the  300 to 400 range.  She apparently had been trying to up her oral  hypoglycemics, Humalog and Lantus with little help.   She has had numerous boils and abscesses on her buttocks for the last  four years which are intermittent.  She says there is a family history  of some people in the family having that without diabetes, but there is  a strong family history of diabetes as well.  She has had them incised  and drained in the past and actually used a needle to try and drain them  herself with some foul drainage.  She has also felt she had some vaginal  discharge as well.   PAST MEDICAL HISTORY:  1. Diabetes nine years' duration with gastroparesis and neuropathy.  2. Hypertension.  3. Dyslipidemia.  4. Sickle cell trait, question of painful crises every two to three      months.  5. Negative HIV testing x2.   PAST SURGICAL HISTORY:  1. C-section x3.  2.  Dental extractions.  3. Cholecystectomy.   ALLERGIES:  TO MORPHINE, CIPRO AND ZOFRAN, APPARENTLY THEY ALL GIVE HER  HIVES.   MEDICATIONS:  Include:  1. Glucotrol.  2. Hydrea.  3. Xanax.  4. Percocet.  5. Humalog 100 units three to four times a day.  6. Lantus 75 units q.h.s.  7. Lisinopril.  8. Protonix.  9. Phenergan.   FAMILY HISTORY:  Pretty strong for diabetes in both her grandmother,  maternal uncle, maternal aunt, also apparently history of hypertension,  congestive heart failure and renal stones.   SOCIAL HISTORY:  Separated from her spouse.  She has seven children  ranging from ages three to 16 years.  She has been on disability for the  past 13 years.  One of her children is at the bedside.  She used to work  at [employer].  She is a smoker, eight cigarettes a day for the last  two years, probable 10 pack years.   REVIEW OF SYSTEMS:  As noted per HPI.  CONSTITUTIONAL:  No fevers or  chills.  EYES/ENT/CARDIAC/RESPIRATORY:  She occasionally has a sore  throat, otherwise negative.  ABDOMEN:  Occasional gastroparesis with  intermittent abdominal pain and vomiting with constipation.  GU:  History of urinary tract infections in the past, but none recently.  NEUROLOGICAL:  Question of some intermittent headaches.  HEME/LYMPH:  Negative.  DERM:  As noted per HPI.  HEPATIC/RENAL:  Otherwise negative.  ENDOCRINE:  As noted per HPI with her poorly controlled diabetes.   VITAL SIGNS:  Her temperature is 98, blood pressure 143/78, pulse  initially under 10, currently 90, respirations 18, 99% on room air.  GENERAL:  She is a well-developed, well-nourished, severely obese  female, not toxic, but uncomfortable.  PSYCH:  She is a little bit anxious, but consolable.  I do not think she  has very good insight, but she is of at least average intelligence.  No  evidence of dementia, delirium or psychosis.  EYES:  Pupils equal, round and reactive to light.  Extraocular movements   intact.  Sclerae nonicteric or injected.  HEENT:  She is normocephalic.  Nasopharynx and oropharynx appear to be  clear.  No facial asymmetry.  NECK:  Supple without any masses.  Trachea is midline.  HEART:  Regular rate and rhythm.  No murmurs, gallops or rubs.  No  obvious bruits.  LUNGS:  Clear to auscultation bilaterally.  ABDOMEN:  Severely obese, but soft.  No tenderness to palpation.  NEUROLOGICAL:  Cranial nerves II-XII are intact.  Hand grip is 5/5,  equal and symmetrical.  No resting or intention tremors.  No obvious  focal motor deficits.  Some decreased sensation in bilateral feet.  SKIN:  She has numerous, hyperpigmented subcutaneous nodules along  bilateral glutei and on her thighs, most of them are 1 cm and less in  size, however in the left inner posterior thigh, she has a 3 x 5 cm area  of induration and tenderness with about a 6 x 12 mm area of eschar and  skin breakdown concerning for a deeper abscess.  EXTREMITIES:  No significant clubbing, cyanosis.  LYMPH:  No neck, axillary or groin lymphadenopathy.   LABORATORY VALUES:  Urinalysis is somewhat dirty with a lot of glucose.  White count is 11.2, hemoglobin of 8.3, glucose of 401.   ASSESSMENT/PLAN:  Numerous subcutaneous skin abscesses.   1. Admit.  2. IV antibiotics.  3. Incision and drainage of the larger abscess.  She was extremely      anxious about this and was very worried about having any cutting      aside from general anesthesia.  After a long discussion was made,      recommendation was made for conscious sedation and analgesia      preemptively prior to local anesthetic and during incision and      drainage.  Will also obtain a culture of this area given the      likelihood of having resistance to antibiotics.  She does not have      any classic presentation for hidradenitis suppurativa, so I do not      think Y excision and closure would help.  4. Poorly controlled diabetes.  I think she needs some  serious      restructuring on enforcement of education and treatment.  It is      rather surprising that she is on oral hypoglycemics and Lantus and      aggressive IV  Humalog and she is still poorly controlled.  She      still smokes and she was eating a bag of chips, so I do not think      her diet choices are that great either, and I strongly stressed to      her that until her diabetes is under better control these skin      problems will not begin to resolve.  5. Question hypertension per primary care physician.  6. Anemia with history of sickle cell trait, question if she has      sickle cell disease.  7. Follow up hypokalemia per primary service.  8. Tobacco abuse.  I  would strongly recommend she stop smoking.      Ardeth Sportsman, MD  Electronically Signed     SCG/MEDQ  D:  01/04/2007  T:  01/04/2007  Job:  [id number]

## 2010-11-07 NOTE — Discharge Summary (Signed)
[name], [name]               ACCOUNT NO.:  [id number]   MEDICAL RECORD NO.:  [id number]          PATIENT TYPE:  INP   LOCATION:  5711                         FACILITY:  [facility]   PHYSICIAN:  Dellia Beckwith, M.D. DATE OF BIRTH:  [birth year]   DATE OF ADMISSION:  04/16/2007  DATE OF DISCHARGE:  04/17/2007                               DISCHARGE SUMMARY   DISCHARGE DIAGNOSES:  1. Polysubstance abuse with narcotics.  2. Nausea, vomiting of unclear etiology, most likely self induced.  3. Uncontrolled diabetes mellitus type 2.  4. Diabetic neuropathy.  5. Chronic microcytic anemia, most likely secondary to menorrhagia.  6. Anxiety disorder, not otherwise specified.  7. History of cholecystectomy in 2002.  8. History of 3 prior C-sections.  9. History of medical noncompliance.   DISCHARGE MEDICATIONS:  1. Metformin 500 mg p.o. b.i.d.  2. Glucotrol XL 10 mg p.o.  b.i.d.  3. Lantus 70 units once in the morning and once at night.  4. NovoLog 8 units 3 times daily before each meal.  5. Xanax 1 mg p.o. t.i.d./p.r.n. for anxiety.  6. Effexor 37.5 mg p.o. daily.  7. Protonix 40 mg p.o. daily.  8. Vitamin B12 1000 mcg p.o. daily.  9. Folic acid 0.4 mg p.o. daily.  10.Ferrous sulfate 325 mg p.o. b.i.d.  11.Phenergan 25 mg p.o. every 6 hours p.r.n. for nausea, vomiting.   DISPOSITION AND FOLLOWUP:  The patient is to follow up with Dr. Lorel Monaco  at the outpatient clinic of [facility] on November 10th, 2008 at 9:15  a.m.  At this time, patient's blood pressure needs to be rechecked and  possibly started on antihypertensive medications.  In addition,  patient's free T4 and T3 needs to be followed as TSH was low upon  admission.  If nausea, vomiting recurs, the patient may benefit from an  EGD as an outpatient or a urine assay for acute intermittent porphyria.   PROCEDURES PERFORMED:  Abdominal series showed no acute findings except  for a small right renal stone.   BRIEF ADMITTING  HISTORY AND PHYSICAL:  [name] is a 35 year old  African American female with a history of greater than 10 emergency room  visits and 6 admissions for repeated nausea, vomiting within the past 2  years.  She has a history of anxiety disorder, diabetes type 2, drug  seeking behavior, poor medication compliance and chronic macrocytic  anemia.  She again comes in with complaints of nausea and vomiting for  24-36 hours prior to admission and has been unable to keep any p.o.  intake down.  The patient denies hematemesis, but does admit to having  approximately 4 or 5 episodes of diarrhea within 24 hours of admission.  She denies chest pain or shortness of breath, fevers or chills.  The  patient also complains of bilateral lower extremity pain consistent with  her longstanding diabetic neuropathy.  She says she has been taking  Phenergan p.r.n. without relief.  The patient denied any other sick  contacts at home.  She also denies any URI symptoms or dysuria.  The  patient used to see  Dr. Wendelyn Breslow, who was her primary care physician until  recently.  The patient says that she no longer sees Dr. Wendelyn Breslow because  she wants to see someone that can see both her and her children  together.   PHYSICAL EXAMINATION:  VITAL SIGNS:  On admission, the patient had a  temperature of 99.5, blood pressure of 172/88, pulse of 85, respiratory  rate of 18 and saturating 100% on room.  GENERAL:  She was a mildly obese, black female apparently in mild  distress, initially walking up and down the hallway.  HEENT EXAM:  Showed pupils equal, round and reactive to light with  intact extraocular motion.  Oropharynx and oral cavity was clear without  exudate or erythema.  NECK:  Was supple with no JVD.  RESPIRATORY EXAM:  Was clear to auscultation bilaterally with good air  movement.  CARDIOVASCULAR EXAM:  Showed regular rate and rhythm with no murmurs,  rubs or gallops.  ABDOMINAL EXAM:  Showed positive bowel sounds.   Abdomen was soft and  slightly obese.  The patient admits to tenderness to palpation  diffusely; however, there was no tenderness to palpation using a  stethoscope.  There was no rebound or guarding.  No hepatosplenomegaly  was appreciated on exam.  EXTREMITY EXAM:  Shows no clubbing, cyanosis or edema.  NEURO EXAM:  Showed patient alert and oriented x3, 5/5 strength in all  extremities, 2+ DTRs.  Cranial nerves II-XII were intact with normal  gait.  The patient endorses decreased sensation in her lower  extremities, right greater than left, consistent with her diabetic  neuropathy.   LABORATORY DATA:  On admission, patient's white blood cell count was  9.0, hemoglobin of 10.0, platelets of 520, with MCV of 65.  Sodium was  138.  Potassium was 3.6.  Chloride was 101.  CO2 was 28.  BUN of 5.  Creatinine of 0.6.  Glucose of 306.  Total bilirubin was 0.8.  Alk phos  64.  AST 15.  ALT 19.  Total protein 8.4.  Albumin 4.1.  Calcium 9.5.  Red blood cell smear showed hypersegmented neutrophils along with a few  elliptocytesand teardrop shaped cells.  Urine drug was only positive for  benzos.  Urine pregnancy test was negative.  Serum lipase was 28.  The  patient had a negative acetaminophen and ethanol level.  A negative  acetone level as well.  Urinalysis was solely significant for 500  glucose, 15 ketones and greater than 300 protein.  TSH was 0.18.  Hemoglobin A1c was 8.8.  ESR of 22.  Ferritin of 8.0.   HOSPITAL COURSE:  By problem.  PROBLEM:  1. Polysubstance abuse with prescription narcotics.  Upon admission,      the patient's old primary care [name], Dr. Wendelyn Breslow, was contacted      and primary care team was informed the patient had 2 separate      police records for trying to sell prescription narcotics including      Percocet.  In addition, the patient had used her 59 year old      daughter to fake a sickle cell crisis in order to obtain pain      medications at least once in the  past.  Several sickle cell strains      have been performed on patient and her daughter in the past, all      within normal limits.  When confronted about this, the patient      adamantly denied everything.  Several  discussions were held between      the patient and the primary care team, during which patient was      extensively counseled on cessation of narcotic abuse prior to      discharge.  The patient was given the number to the [facility] and strongly urged to contact them after      discharge for inpatient rehab.  Although somewhat resistant at      first, the patient eventually agreed to try her best to make the      appointment with [facility].  2. Nausea, vomiting, recurrent.  In the past 2 years, the patient has      had 6 hospital admissions for similar complaints of nausea,      vomiting and greater than 10 ER visits for nonspecific abdominal      and back pain symptoms.  She has had an extensive GI workup      including a normal gastric emptying study in December 2006 and      multiple negative CT scans of abdomen and pelvis in April of 2007      and July 2008.  The patient had a normal abdominal series on this      admission and was rehydrated with IV normal saline and kept n.p.o.      IV Phenergan was used to control her pain and nausea with good      effect.  Potential diagnosis of cyclical vomiting syndrome or acute      intermittent porphyria was being considered until nurse's wrote      that the patient could be self inducing the vomiting.  At that      point, all narcotics and antiemetics were stopped.  Upon      confrontation, the patient denied ever intentionally inducing      vomiting and even continued to bane symptoms of nausea.  Upon      discharge, the patient was stated on a trial of Protonix and      consideration was given to an outpatient EGD to further rule out      any organic etiology for a recurrent  complaints of abdominal pain,      nausea and vomiting.  3. Diabetes mellitus type 2.  During hospital stay, patient refused      all of her NovoLog shots as recorded by her nursed and CBG stayed      between 225 and 275.  Review of prior records show that patient has      done similar acts of noncompliance, especially with her diabetic      medications in past hospitalizations.  In addition, patient refused      recommendations to start Neurontin for her diabetic neuropathy,      stating that she gets nosebleeds and is allergic to this class of      drugs and would like instead to stick with her Percocet to control      her pain.  Upon discharge, the patient was restarted on her home      diabetic medications including Lantus, metformin, NovoLog and      Glucotrol and encouraged to eat an ADA approved diet.  4. Macrocytic anemia.  The patient's records from the past indicate      that she has chronic macrocytic anemia most likely secondary to      menorrhagia and iron deficiency; however, peripheral smear showed  hypersegmented  neutrophils along with teardrop shaped red blood      cells suggestive for myelofibrosis of liver disease, although      patient has had normal LFTs.  Anemia highly unlikely to be      secondary to hemoglobinopathy since patient has had by prior sickle      cell screens, all within normal limits.  Since iron deficiency can      be a cause of macrocytosis with low ferritin and also abnormally      shaped red blood cells, the patient was started on iron supplements      and B12 folate supplements as well.  5. Anxiety disorder.  The patient was continued on home anxiety      medications including Xanax and Effexor.  Upon admission, the      patient stated that she has Seroquel and Haldol at home for      previous diagnosis of bipolar disorder; however, review of prior      medical records including a recent psych consult note make no      mention of any  psychiatric diagnosis other than general anxiety.  6. Decreased TSH.  The patient's TSH on admission was decreased.      Although she denies symptoms of hypothyroidism including      palpitations, weight loss and hot intolerance, a free T4 and T3      level were drawn.  Results are currently pending and need to be      followed up at next outpatient clinic visit.  7. Possible hypertension.  Prior records show no diagnosis of      hypertension; however, patient's blood pressure was elevated upon      admission, and patient required 1 dose of clonidine overnight to      keep systolic blood pressure less than 180.  Considering patient      had proteinuria on urinalysis, she may benefit from a trial of ACE      inhibitors at next clinic visit if blood pressure remains elevated      and also to prevent the further renal complications of her      diabetes.   DISCHARGE VITALS AND LABS:  On discharge, the patient had a temperature  of 99.1.  Blood pressure 168/91.  Pulse of 78.  Respirations of 20.  Saturating 100% on room air.  White blood cell count was 8.7.  Hemoglobin was 10.3.  Hematocrit was 33.2.  Platelets were 419, with an  MCV of 65.  Sodium was 139.  Potassium of 3.3.  Chloride of 101.  CO2 of  26.  BUN of 4.  Creatinine of 0.5, with a glucose of 210.  Calcium was  9.1.  HIV antibody was negative.  C. diff assay was negative and fecal  lactoferrin assay was negative.      Dellia Beckwith, M.D.  Electronically Signed     VD/MEDQ  D:  04/18/2007  T:  04/19/2007  Job:  [id number]   cc:   Dellia Beckwith, M.D.

## 2010-11-07 NOTE — H&P (Signed)
[name], [name]               ACCOUNT NO.:  [id number]   MEDICAL RECORD NO.:  [id number]          PATIENT TYPE:  IPS   LOCATION:  0307                          FACILITY:  [facility]   PHYSICIAN:  Jasmine Pang, M.D. DATE OF BIRTH:  February 18, 1976   DATE OF ADMISSION:  03/09/2008  DATE OF DISCHARGE:  03/10/2008                       PSYCHIATRIC ADMISSION ASSESSMENT   PATIENT IDENTIFICATION:  A 35 year old female voluntarily admitted on  March 09, 2008.   HISTORY OF PRESENT ILLNESS:  The patient presents with a history of  depression, was having thoughts to kill herself.  She was hoping that  someone would stab or shoot her.  She has been feeling this way for  weeks.  She initially went to the emergency room to be assessed for her  sickle cell crisis, and then had come over to our facility for  admission.  Her 26 year old daughter had accompanied her and also was  admitted for suicidal thoughts as well.  The patient reports her sleep  has been decreased.  She has lost weight.  Stressors are that her  husband drinks and gets on her nerves.  also having problems with  finances.   PAST PSYCHIATRIC HISTORY:  First admission to [facility].  She reports a history of bipolar disorder.  Has been on Seroquel in the  past.   SOCIAL HISTORY:  This is a 35 year old married female.  She lives in  [place].  She is on disability.  Chart indicates the patient has 8  children.   FAMILY HISTORY:  Unknown.   ALCOHOL AND DRUG HABITS:  Denies any alcohol or drug use.  She smokes  cigarettes.   PRIMARY CARE [name]:  Dr. Loleta Chance and Dr. Leretha Dykes.   MEDICAL PROBLEMS:  Insulin dependent diabetes, sickle cell crisis per  patient and asthma.   MEDICATIONS:  1. Has been off her Seroquel for one week, was taking 300 mg t.i.d.  2. Haldol.  3. Xanax.  4. Wellbutrin.  5. Trazodone.  6. Glucotrol.  7. Humalog insulin.   Dosages are unclear and the last use is unknown.   DRUG  ALLERGIES:  CIPRO AND MORPHINE.   PHYSICAL EXAMINATION:  GENERAL:  The patient was assessed at [facility]  emergency department, was discharged on a prescription for Motrin and  ferrous sulfate for her pain.  VITAL SIGNS:  Temperature is 978, 77 heart rate, 18 respirations, blood  pressure is 111/78, 5 feet 7 inches tall, 193 pounds.   LABORATORY DATA:  Shows a glucose of 155.  Pregnancy test is negative.  Hemoglobin of 9.2, hematocrit 30.6 chest x-ray was negative.  Urinalysis  did show 3-6 WBCs.  Patient today is fully alert, cooperative, fair eye  contact.  She appears somewhat guarded and somewhat irritable.  Her  speech is clear.  Does not offer other information.  Does answer  questions, however, the patient seems somewhat grim, asking about  wanting to see her daughter who is again on the child adolescent unit.  Thought processes are coherent.  There is no evidence of any psychotic  features.  Denies any current suicidal thoughts.  Cognitive function  intact.  Memory is fair.  Judgment insight seems limited at this time.   DIAGNOSES:  AXIS I:  Mood disorder.  AXIS II:  Deferred.  AXIS III:  Type 2 diabetes, asthma and history of sickle cell crisis  reported by the patient.  AXIS IV:  Problems with primary support group, medical problems, other  psychosocial problems.  AXIS V:  Current is 35-40.   PLAN:  Contract for safety.  Stabilize mood thinking.  We will check  CBCs and put patient on a sliding scale insulin at this time.  Will  continue with her Seroquel at bedtime, have p.r.n. Seroquel throughout  the day.  Will continue to gather more history off her family session.  The patient will be in the blue group.  We will attempt to clarify the  patient's medications.  The patient will need to follow up with her  current medical issues.  Will also offer Motrin for her pain at this  time.  Her tentative length of stay at this time is 3-4 days.      Landry Corporal,  N.P.      Jasmine Pang, M.D.  Electronically Signed    JO/MEDQ  D:  03/10/2008  T:  03/11/2008  Job:  [id number]

## 2010-11-10 NOTE — Discharge Summary (Signed)
NAME:  [name], [name]                         ACCOUNT NO.:  [id number]   MEDICAL RECORD NO.:  [id number]                   PATIENT TYPE:  INP   LOCATION:  9308                                 FACILITY:  [facility]   PHYSICIAN:  Ursula Beath, MD               DATE OF BIRTH:  September 05, 1975   DATE OF ADMISSION:  06/21/2003  DATE OF DISCHARGE:  06/23/2003                                 DISCHARGE SUMMARY   DISCHARGE DIAGNOSES:  1. Intrauterine pregnancy.  2. Proteinuria.  3. Diabetes mellitus type 2.  4. Urinary tract infection.  5. Chronic pain.   DISCHARGE MEDICATIONS:  1. NPH insulin 45 units subcutaneous q.a.m.  2. NPH 43 units subcutaneous q.h.s.  3. Sliding scale insulin with Regular insulin if blood sugars between 75-100     give 20 units subcutaneous; if blood sugars 101-150 give 25 units; 151-     200 give 30 units; 201-250 give 35 units; over 250 she is instructed to     call the [facility].  4. She is also instructed to take Glucophage 500 mg p.o. b.i.d.  5. Macrobid b.i.d. x8 days.  6. She is instructed to use her pain medications for her chronic pain as     directed by her pain clinic physician.   HOSPITAL COURSE:  Problem 1 - INTRAUTERINE PREGNANCY:  Ms. Koors appeared to  do well with her pregnancy and routine prenatal care was continued without  event.   Problem 2 - PROTEINURIA:  She had 30 of protein on a urine dip.  A 24-hour  urine was collected showing a total volume of 3.7 L over 24 hours and a  total protein of 268 mg during that time.  It is felt that the proteinuria  is probably insignificant at this time.  Initial UA was also positive for  glucose 250, 15 of ketones, 30 of protein, trace leukocytes, 3-6 white blood  cells, and many bacteria.  This should be monitored closely as an outpatient  at [facility].   Problem 3 - DIABETES MELLITUS TYPE 2:  Ms. Gabrys apparently has been using  her insulin incorrectly at home and is not interested in  following the  directions in the hospital.  She also is checking her own blood sugars and  refusing nursing to follow behind her to check them.  She is also giving  herself home insulin during her hospitalization.  She is eating food outside  her diet plan and appears to be resistant to our assistance with this.  She  is instructed in detail on how to control her diabetes at home with diet and  with insulin.  During her hospitalization her blood sugars per the patient  (she would not allow the nurses to check behind her) were between 100-150  and this was estimated to be the range at which we were comfortable to send  her home.  Therefore, she was discharged at this rate with detailed  instructions to check her blood sugars and use insulin as previously noted.   Problem 4 - URINARY TRACT INFECTION:  As noted on her UA, she had bacteria,  white cells, and trace leukocytes.  Therefore, she was started on a course  of Macrobid.  This will be continued as an outpatient.  She was given a  prescription for this.   Problem 5 - CHRONIC PAIN:  She sees a physician at a pain clinic in [facility] and this was used during her hospitalization.  She is permitted three  Percocet a day and she is allowed to take these - at her will we are not  providing a prescription for this as she has a contract with the pain clinic  and will have to obtain these from there.   FOLLOWUP:  The patient is to follow up at [facility] on Thursday,  January 6 at 10:30 a.m.   DISCHARGE INSTRUCTIONS:  She is also instructed to check her blood sugars  before meals, after meals, and at bedtime.   ACTIVITY:  She has no activity restrictions.   DIET:  She is instructed to use a diabetic diet as previously noted.                                               Ursula Beath, MD    JT/MEDQ  D:  06/23/2003  T:  06/23/2003  Job:  [id number]

## 2010-11-10 NOTE — Discharge Summary (Signed)
[name], [name]               ACCOUNT NO.:  [id number]   MEDICAL RECORD NO.:  [id number]          PATIENT TYPE:  OBV   LOCATION:  2017                         FACILITY:  [facility]   PHYSICIAN:  Eduardo Osier. Sharyn Lull, M.D. DATE OF BIRTH:  March 14, 1976   DATE OF ADMISSION:  01/29/2006  DATE OF DISCHARGE:  01/30/2006                                 DISCHARGE SUMMARY   ADMITTING DIAGNOSES:  Chronic abdominal pain; rule out gastritis, rule out  pancreatitis, rule out gastroparesis, uncontrolled diabetes mellitus,  elevated blood pressure, rule out hypertension, chronic anemia, questionable  history of sickle cell trait atypical chest pain.   FINAL DIAGNOSES:  Chronic abdominal pain; rule out gastritis, rule out  pancreatitis, rule out gastroparesis, uncontrolled diabetes mellitus,  elevated blood pressure, rule out hypertension, chronic anemia, questionable  history of sickle cell trait atypical chest pain.  The patient signed out  against medical advice.   BRIEF HISTORY AND HOSPITAL COURSE:  [name] is a 35 year old black  female with past medical history significant for insulin-requiring diabetes  mellitus, GERD, chronic back pain and chronic abdominal pain.  She came to  the ER complaining of generalized abdominal pain associated with nausea,  vomiting.  She denies any diarrhea.  Denies any urinary complaints.  Denies  fever, chills.  Denies any vaginal discharge.  She also complains of vague  retrosternal chest pain, sharp in nature.  Denies any palpitation,  lightheadedness or syncope.  Denies PND, orthopnea, leg swelling.  No  history of exertional chest pain.  Denies any drug abuse.  States her blood  sugar has been running high.   PAST MEDICAL HISTORY:  As above, plus history of sickle cell trait and  chronic anemia.   PAST SURGICAL HISTORY:  1. C-sections x3.  2. Cholecystectomy the past.   SOCIAL HISTORY:  She is single, has 3 children.  Smokes 6 cigarettes per day  for 10+ years.  No history of alcohol or drug abuse.  She is also __________  positive.   FAMILY HISTORY:  Family history is noncontributory.   ALLERGIES:  She is allergic to MORPHINE, CIPRO, ZOFRAN.   MEDICATIONS AT HOME:  1. Toprol XL 10 mg p.o. daily.  2. Actos 30 mg p.o. daily.  3. Humalog sliding scale.   PHYSICAL EXAMINATION:  GENERAL:  On examination she is alert and oriented  x3, in no acute distress.  VITAL SIGNS:  Blood pressure 164/84, pulse 86, afebrile.  HEENT:  Conjunctivae was pink.  NECK:  Supple, no JVD, no bruit.  LUNGS:  Clear to auscultation without rhonchi.  CARDIOVASCULAR:  S1, S2 was normal.  There was no S3 gallop;  no murmur.  ABDOMEN:  Soft.  Bowel sounds present.  There was mild tenderness in the  epigastric region, with no guarding or rebound tenderness.  EXTREMITIES: There was no clubbing, cyanosis or edema.   LABS:  EKG showed normal sinus rhythm with septal Q-waves, no new acute  ischemic changes were noted.  Hemoglobin 9.4, hematocrit 30.3, white count  11.0 with left shift.  D-dimer was slightly elevated 0.67.  Potassium 3.5,  glucose 294, BUN 0.5, creatinine 0.6.  Lipase 39.  Urine pregnancy test was  negative.  CPK-MB was less than 1.0.  Troponin I was 0.05.  CT of the  abdomen and pelvis done in April, about 4 months ago, was similar chronic  abdominal pain and was negative.  Cholesterol 161, HDL 29, LDL 79.  CK first  set was negative.  Troponin I was negative.  Lipase was slightly elevated --  first set 39, second 52 (which was slightly elevated).  Amylase 2 sets were  normal.   BRIEF HOSPITAL COURSE:  The patient was admitted on the regular floor with  diagnosis of atypical chest pain and chronic abdominal pain.  The patient  was started on broad-spectrum antibiotics and Lovenox.  The patient  continued to have vague abdominal pain.  The patient has had a prior history  of drug seeking behavior and had chronic abdominal pain in the past.   The  patient refused to stay in the hospital and signed out against medical  advice.           ______________________________  Eduardo Osier Sharyn Lull, M.D.     MNH/MEDQ  D:  03/21/2006  T:  03/23/2006  Job:  [id number]

## 2010-11-10 NOTE — Op Note (Signed)
NAME:  [name], [name]                         ACCOUNT NO.:  [id number]   MEDICAL RECORD NO.:  [id number]                   PATIENT TYPE:  INP   LOCATION:  9107                                 FACILITY:  [facility]   PHYSICIAN:  Lesly Dukes, M.D.              DATE OF BIRTH:  [birth year]   DATE OF PROCEDURE:  12-02-202005  DATE OF DISCHARGE:  10/10/2003                                 OPERATIVE REPORT   PREOPERATIVE DIAGNOSIS:  A 35 year old female, G8, para 6-0-1-6, at 36 weeks  3 days estimated gestational age, with preeclampsia, for repeat cesarean  section, and possible herpes simplex virus outbreak.   POSTOPERATIVE DIAGNOSIS:  A 35 year old female, G8, para 6-0-1-6, at 44  weeks 3 days estimated gestational age, with preeclampsia, for repeat  cesarean section, and possible herpes simplex virus outbreak.   PROCEDURE:  Repeat low flap transverse cesarean section.   SURGEONS:  Lesly Dukes, M.D., and Shelbie Proctor. Shawnie Pons, M.D.   ANESTHESIA:  Spinal.   ESTIMATED BLOOD LOSS:  800.   COMPLICATIONS:  None.   PATHOLOGY:  Placenta.   FINDINGS:  A viable infant, Apgars 8 at one minute and 8 at five minutes,  vertex presentation, OP.  Clear fluid.  Arterial cord pH 7.21.  Normal  fallopian tubes and ovaries bilaterally.  Pediatrics at delivery.   PROCEDURE:  After informed consent was obtained, the patient was taken to  the operating room, where spinal anesthesia was found to be adequate.  The  patient was placed in the dorsal supine position with a leftward tilt, a  Foley was placed in the bladder, and the patient's abdomen was prepared and  draped in normal sterile fashion.  A Pfannenstiel skin incision was made  with a scalpel and carried down to the underlying layer of fascia.  The  fascia was incised in the midline and was extended bilaterally.  The  superior and inferior aspects of the fascial incision were grasped with  Kocher clamps, tented up, and dissected off sharply and  bluntly from the  underlying layers of rectus muscles.  The rectus muscles separated in the  midline.  The peritoneum was identified, tented up, and entered sharply with  Metzenbaum scissors.  This incision was extended bilaterally with good  visualization of the bladder.  The vesicouterine peritoneum was identified,  tented up, and entered sharply with the Metzenbaum scissors.  The incision  was extended bilaterally, and the bladder flap was created digitally.  The  bladder blade was inserted.  The uterus was incised in the lower uterine  segment in a transverse fashion.  This incision was extended bilaterally  with the bandage scissor.  The baby's head delivered without incident, and  the nose and mouth were suctioned.  The rest of the baby's body delivered  easily.  The cord was clamped and cut and the baby was handed off to the  awaiting pediatrician.  Cord  blood was sent for type and screen and an  arterial cord gas was sent.  The placenta delivered spontaneously with three-  vessel cord.  The uterus was cleared of all clots and debris.  The uterine  incision was then closed with 0 Vicryl in a running locked fashion.  At this  time the patient reiterated that she did not want her fallopian tubes  ligated.  The uterus was noted to be hemostatic off tension.  The abdomen  was irrigated and hemostasis was assured.  The peritoneum and rectus muscles  were hemostatic.  The fascia was then closed with 0 Vicryl in a running  fashion.  Good hemostasis was noted.  The subcutaneous tissue was copiously  irrigated.  The  skin was then closed with 4-0 Vicryl in a subcuticular fashion.  The patient  tolerated the procedure well.  The sponge, lap, instrument, and needle count  were correct x2.  The patient went to the recovery room in stable condition.                                               Lesly Dukes, M.D.    Lora Paula  D:  10/25/2003  T:  10/26/2003  Job:  [id number]

## 2010-11-10 NOTE — H&P (Signed)
NAMEFRANCESCA, STROME              ACCOUNT NO.:  [id number]   MEDICAL RECORD NO.:  [id number]          PATIENT TYPE:  EMS   LOCATION:  MAJO                         FACILITY:  [facility]   PHYSICIAN:  Sherin Quarry, MD      DATE OF BIRTH:  [birth year]   DATE OF ADMISSION:  10/15/2005  DATE OF DISCHARGE:                                HISTORY & PHYSICAL   HISTORY OF PRESENT ILLNESS:  [name] is a 35 year old lady who  presents frequently to the emergency room with complaints of abdominal pain,  nausea and vomiting.  She was last hospitalized in December of 2006, and  basically was treated symptomatically.  She says that she has episodes of  nausea and vomiting about every 2 weeks, for which she takes Phenergan  p.r.n.  She states that she is a patient of Dr. Shana Chute, but does not recall  when she last saw him. and Dr. Shana Chute indicates that he is not familiar  with her.  Apparently today, she began to experience nausea and vomiting  associated with diffuse abdominal pain. She presented to the emergency room  where she was noted to have a blood pressure of 155/85, pulse 74,  respirations 20.  She was given to Zofran 4 milligrams for nausea, Dilaudid  2 milligrams with Phenergan 12.5 milligrams, another two doses of Dilaudid  and Reglan 10 milligrams IV.  She continued to complain of abdominal pain  and nausea, and, therefore, it was felt prudent to admit her to the  hospital. It should be noted that last year she had extensive evaluation for  abdominal pain and vomiting, and at that time had a normal gastric emptying  study and a normal CT scan of the abdomen. Also during the course of this  emergency room visit, she had another CT scan of the abdomen which was also  normal.   LABORATORY STUDIES:  Her laboratory studies were notable for a glucose of  309.  They were otherwise unremarkable. The patient has a longstanding  history of diabetes.  I am unable to determine exactly how she  manages her  insulin treatments. She says that she takes Lantus insulin 40 units daily  and that she measures her blood sugars three times a day. She insists that  if her blood sugar is 200-300. She will take 150 units of insulin.  I asked  her to repeat this several times, and she was insistent this is, in fact,  which she does.  She does not seem to know what kind of insulin this is.  It  may be NPH insulin.  She says that her blood sugars are always high.  She  says she has had no problems or kidney problems related to diabetes. She has  had no hematemesis, melena, fevers, chills, breathing difficulty or dysuria.   PAST MEDICAL HISTORY:   MEDICATIONS AT PRESENT:  1.  Glucotrol 1 tablet daily.  2.  NPH insulin which she takes in an unclear fashion.  3.  Nexium 40 milligrams daily.  4.  Percocet p.r.n. for pain.   ALLERGIES:  She is  said to be allergic to:  1.  MORPHINE.  2.  ROBAXIN.  3.  DEMEROL.   She has had several C-sections in the past and on one occasion at least was  suspected of having herpes genitalis, according to the records.   MEDICAL ILLNESSES:  1.  Diabetes.  2.  Chronic abdominal pain of unclear etiology.   FAMILY HISTORY:  Noncontributory.   SOCIAL HISTORY:  The patient smokes regularly. She insists that she does not  abuse drugs of any kind and does not use any street drugs and states that  she does not drink alcohol.  She states that she has Medicaid and wishes to  identify a primary care doctor.   FAMILY HISTORY:  Otherwise negative.   PHYSICAL EXAMINATION:  VITAL SIGNS:  Blood pressure is 130/63, temperature  is 98.3, pulse 71, respirations 18, O2 saturations 96%.  HEENT: Exam is within normal limits.  CHEST:  Clear.  BACK:  No CVA or point tenderness.  CARDIOVASCULAR:  Normal S1-S2. There are no rubs, murmurs or gallops.  ABDOMEN:  Absolutely benign as far as I can tell.  There is no guarding or  rebound. No masses or tenderness. Bowel sounds were  present.  NEUROLOGIC:  Neurologic testing, examination of the extremities was normal.   IMPRESSION:  1.  Chronic abdominal pain, etiology unclear.  It is possible the patient      has gastroparesis, although note that her gastric emptying study was      normal last year.  2.  Chronic diabetes probably unregulated.  3.  Possible gastroesophageal reflux.  4.  Chronic back and leg pain.   PLAN:  Will manage the patient symptomatically with intravenous fluids and  pain medications.  Will monitor the patient's blood sugar following a  sliding scale insulin regimen.           ______________________________  Sherin Quarry, MD     SY/MEDQ  D:  10/15/2005  T:  10/16/2005  Job:  [id number]   cc:   Osvaldo Shipper. Spruill, M.D.  Fax: [phone]

## 2010-11-10 NOTE — Discharge Summary (Signed)
NAMEEVONNE, RINKS               ACCOUNT NO.:  [id number]   MEDICAL RECORD NO.:  [id number]          PATIENT TYPE:  INP   LOCATION:  5733                         FACILITY:  [facility]   PHYSICIAN:  Jackie Plum, M.D.DATE OF BIRTH:  Nov 08, 1975   DATE OF ADMISSION:  10/15/2005  DATE OF DISCHARGE:  10/17/2005                                 DISCHARGE SUMMARY   DISCHARGE DIAGNOSES:  1.  Acute exacerbation of chronic abdominal pain, resolved. Etiology      unclear.  2.  Diabetes mellitus.  3.  Gastroesophageal reflux disease.   DISCHARGE MEDICATIONS:  The patient is to continue her Glucotrol XL,  Flexeril, Xanax, Humalog SSI, Percocet and Nexium as previously.   REASON FOR ADMISSION:  Abdominal pain.   The patient is a 35 year old lady with a history of chronic abdominal pain  who presented with one day of epigastric abdominal pain which radiated to  her back. The pain was severe in intensity, 10/10. She had some vomiting and  fever without any __________ . In the ED, the patient had a CT scan done  which was unremarkable. The patient was admitted for pain control to the  hospitalist service. Please see full details regarding the patient's  presentation by the printed H&P by Dr. Dr. Sherin Quarry.   HOSPITAL COURSE:  The patient was admitted to the hospitalist service, she  received supportive care with IV fluids with careful monitoring of her CBGs  in view of her history of diabetes mellitus. She was given Lasix . Within 48  hours of admission, the patient was feeling fine ready for discharge. On  rounds on October 17, 2005, the patient was comfortable, not in distress. Her  vital signs were stable and afebrile, she had not had any vomiting. Her  lungs were clear to auscultation. Abdomen was soft, nontender, bowel sounds  were present. Extremities did not have any cyanosis. She was discharged in  stable condition to followup with her PCP, Dr. Shana Chute.      Jackie Plum,  M.D.  Electronically Signed     GO/MEDQ  D:  11/29/2005  T:  11/30/2005  Job:  [id number]

## 2010-11-10 NOTE — Op Note (Signed)
[place]  Patient:    [name], [name] Visit Number: [id number] MRN: [id number]          Service Type: EMS Location: ED Attending Physician:  Lorre Nick Dictated by:   Mardene Celeste. Lurene Shadow, M.D. Proc. Date: 04/21/01 Admit Date:  02/23/2001 Discharge Date: 02/23/2001                             Operative Report  PREOPERATIVE DIAGNOSIS:  Chronic calculus cholecystitis.  POSTOPERATIVE DIAGNOSIS:  Chronic calculus cholecystitis.  OPERATION PERFORMED:  Laparoscopic cholecystectomy with intraoperative cholangiogram.  SURGEON:  Luisa Hart L. Lurene Shadow, M.D.  ASSISTANT:  Marnee Spring. Wiliam Ke, M.D.  ANESTHESIA:  General.  INDICATIONS FOR PROCEDURE:  The patient is a 35 year old woman presenting with upper abdominal symptoms of epigastric and right upper quadrant pain associated with nausea and vomiting.  Cholelithiasis diagnosed on abdominal ultrasound.  Brought to the operating room now for laparoscopic cholecystectomy.  Liver function studies have been shown to be within normal limits.  DESCRIPTION OF PROCEDURE:  Following induction of satisfactory anesthesia with the patient positioned supinely, the abdomen was routinely prepped and draped to be included in a sterile operative field.  Open laparoscopy created at the umbilicus with insertion of Hasson cannula and insufflation of the peritoneal cavity to 14 mmHg.  Camera was inserted and visual exploration of the abdomen carried out.  None of the large or small intestine viewed appeared to be abnormal.  The gallbladder was chronically scarred.  The liver edges were sharp.  The liver surface was smooth and the pelvic organs were not visualized.  Under direct vision, epigastric and lateral ports were placed.  The gallbladder was then grasped and retracted cephalad above the liver. Dissection was then carried down in the ampulla of the gallbladder to isolate the cystic artery and cystic duct.  The  cystic artery was then double clipped and transected.  The cystic duct was clipped proximally and opened.  A 14 gauge angiocath was then passed into the abdomen and inserted into the cystic duct and cholangiogram carried out with one half strength Hypaque dye which showed free flow of contrast into the duodenum with no filling defects.  All hepatic radicals were clear.  The cholangiocatheter was then removed and the cystic duct was then doubly clipped and transected.  The gallbladder was dissected free from the liver bed using electrocautery and maintaining hemostasis throughout the course of the dissection.  At the end of dissection, the gallbladder bed was then checked for hemostasis.  Additional bleeding points were treated with electrocautery.  The right upper quadrant was then thoroughly irrigated and aspirated.  Camera was moved to the epigastric ports and the gallbladder was retrieved through the umbilical port without difficulty.  Sponge, instrument and sharp counts were verified.  Trocars were removed under direct vision.  The pneumoperitoneum was allowed to deflate and the wounds closed in layers as follows.  Umbilical wound in two layers with 0 Dexon and 4-0 Dexon.  Epigastric and flank wounds closed with 4-0 Dexon sutures.  All wounds were then reinforced with Steri-Strips and sterile dressings were applied.  Anesthetic reversed.  Patient removed from the operating room to the recovery room in stable condition having tolerated the procedure well. Dictated by:   Mardene Celeste. Lurene Shadow, M.D. Attending Physician:  Lorre Nick DD:  04/23/01 TD:  04/23/01 Job: [id number]

## 2010-11-10 NOTE — Discharge Summary (Signed)
   NAME:  VONITA, CALLOWAY                         ACCOUNT NO.:  [id number]   MEDICAL RECORD NO.:  [id number]                   PATIENT TYPE:  INP   LOCATION:  9137                                 FACILITY:  [facility]   PHYSICIAN:  Georgina Peer, M.D.              DATE OF BIRTH:  April 18, 1976   DATE OF ADMISSION:  04/04/2002  DATE OF DISCHARGE:  04/06/2002                                 DISCHARGE SUMMARY   ADMISSION DIAGNOSES:  1. Pregnancy 38 and 6/7 weeks.  2. Previous cesarean section.  3. Labor.  4. Insulin-dependent diabetes.   DISCHARGE DIAGNOSES:  1. Pregnancy 38 and 6/7 weeks.  2. Previous cesarean section.  3. Labor.  4. Insulin-dependent diabetes.   BRIEF HISTORY:  This is a 35 year old gravida 7, para 5-0-1-5 presented with  regular contractions, 6 cm dilated.  A previous cesarean section.  The  patient declined VBAC and was taken for a repeat cesarean section at which  time a 9.1 ounce female was delivered.  Tubes and ovaries were normal.  The  operation was uneventful.  The patient passed gas by the end of the first  day.  Her insulin requirements were much less and blood sugars were between  80 and 200.  She was placed back on some Humulin insulin and also Lantus.  Her blood sugars fasting were less than 100, two hour postprandial between  100 and 200.  Postoperative hemoglobin was 11.0 from preoperative 12.6.  She  had minimal edema.  Her incision was clean and dry and she was afebrile.  She was taking Tylox for pain.  She was voiding without difficulty, passed  flatus, and was on a regular diet.  She was discharged in good continued to  follow up in the office in two weeks for an incision check.  She will  contact Dr. Inez Pilgrim office for diabetes management within the next  week or two.  She will take insulin Lantus 25 units at h.s. and Humalog 10-  20 units depending on blood sugars with meals.  She was given a discharge  instruction booklet and a prescription  for Percocet 5/325 number 60 to take  one to two q.4-6h. p.r.n. pain and also Motrin 400-600 mg q.6h. p.r.n. pain.                                               Georgina Peer, M.D.    JPN/MEDQ  D:  04/06/2002  T:  04/07/2002  Job:  [id number]   cc:   Jarome Matin, M.D.  [address] [place]  [place] [address]  Fax: [phone]

## 2010-11-10 NOTE — Op Note (Signed)
[facility]  Patient:    [name], [name]                   MRN: [id number] Proc. Date: 12/25/99 Adm. Date:  [id number] Attending:  Venita Sheffield                           Operative Report  PREOPERATIVE DIAGNOSIS:       Intrauterine fetal demise.  POSTOPERATIVE DIAGNOSIS:  OPERATION:                    D&E.  SURGEON:                      Kathreen Cosier, M.D.  ASSISTANT:  ANESTHESIA:                   General anesthesia.  ESTIMATED BLOOD LOSS:  DESCRIPTION OF PROCEDURE:     Under general anesthesia with the patient in the lithotomy position, the perineum and vagina were prepped and draped, and the bladder emptied with a straight catheter.  Bimanual examination revealed the uterus to be 8 to 10 weeks size.  Weighted speculum was placed in the vagina. Anterior lip of the cervix was grasped with a tenaculum.  The products of conception were noted to be in the dilated cervix and they were removed with a sponge forcep. hen using a #10 suction, the remaining uterine contents were aspirated.  The patient tolerated the procedure well and was taken to the recovery room in good condition. DD:  12/25/99 TD:  12/25/99 Job: [id number]

## 2010-11-10 NOTE — Discharge Summary (Signed)
NAMEDELANY, [name]               ACCOUNT NO.:  [id number]   MEDICAL RECORD NO.:  [id number]          PATIENT TYPE:  INP   LOCATION:  6738                         FACILITY:  [facility]   PHYSICIAN:  [name], M.D.     DATE OF BIRTH:  [birth year]   DATE OF ADMISSION:  06/13/2005  DATE OF DISCHARGE:  06/17/2005                                 DISCHARGE SUMMARY   DISCHARGE DIAGNOSES:  1.  Emesis, likely secondary to diabetes gastroparesis, also suspicious for      self induced.  2.  Microcytic anemia.  3.  Thrombocytopenia.  4.  Chronic pain.   DISCHARGE MEDICATIONS:  1.  Glucotrol XL 10 mg p.o. daily.  2.  Iron sulfate 325 mg 3 times a day.  3.  Prilosec OTC 20 mg daily.  4.  Percocet 225 mg q.6h. as needed.  5.  Reglan 10 mg q.8h. until emesis completely resolved and then use at      bedtime.  6.  Avandia 4 mg p.o. daily.  7.  Phenergan 12.5 mg q.6h. p.r.n. for nausea and vomiting.   DISCHARGE INSTRUCTIONS:  The patient reports has an appointment with a new  primary care [name] on January 3. Issues to follow are microcytic anemia  studies, follow up thrombocytopenia, follow up diabetes, and follow up  chronic pain issues.   BRIEF PATIENT HISTORY:  This is a 35 year old female with history of  diabetes - type 2, chronic back pain, and chronic anemia, who was admitted  on December 20 with frequent nausea, vomiting, back and abdominal pain, and  dehydration.   PERTINENT LABS ON ADMISSION:  The patient had a white blood cell count of 9,  hemoglobin 8.1, hematocrit 26.7, platelets 137. Her urinalysis with a  specific gravity of 1.029. Urine glucose over 1,000. No bilirubin, 40  ketones, 100 protein, no nitrates, and negative leukocyte esterase. The  patient had a pregnancy test that was negative. The patient had a sodium of  132, potassium 3.4, chloride 104, CO2 23, BUN 2, creatinine 0.5, glucose  241, calcium 8.5. The patient had a flat abdominal x-ray that shows no  evidence  for bowel obstruction.   HOSPITAL COURSE BY PROBLEMS:  1.  Nausea and vomiting. Likely related to diabetes gastroparesis. The      patient was initially treated with Reglan and provided with IV fluids.      Her vital signs remained stable and on discharge her temperature was 99,      heart rate 83, respiratory rate 16 to 20, blood pressure 148/77. She had      an O2 sat of 100% in room air and her CBGs were 176. By the second day      of admission the patient's emesis decreased to about once or twice a      day. These episodes of emesis were never preceded by nausea so the      patient did not make nurses aware of her nausea to provide with      antiemetic medications. While the patient was on IV pain medications her  episodes of emesis decreased. On the 23rd the patient's IV pain      medications were discontinued and the patient started to increase the      episodes of emesis, never witnessed by a nurse. It was suspected that      this event was self induced, trying to obtain IV pain medications. As      per nurses about less than 5 mL on the floor that the patient would call      and they would witness this 4 or 5 times, there was clear liquid without      any food content. The patient's electrolytes remained stable. The      patient was witnessed by me and by nurse with cafeteria food in front of      her. She also was eating partially her hospital tray meals and she had      charted a good p.o. intake in the 48 hours prior to discharge. She was      also voiding over 5 times a day but she would flush her urine prior to      this being quantified by the nurses. The patient was discharged home on      Reglan 10 mg t.i.d. to transition to 1 at bedtime once emesis completely      resolved to treat for diabetes gastroparesis and we also gave      prescription for Phenergan 12.5 mg to use q.6h. p.r.n.  2.  Anemia. The patient's hemoglobin remained stable during this admission.      She  reports known chronic anemia. She has microcytic anemia with low      ferritin and the differential could be iron deficiency. She reports      heavy menstrual periods. Also in the differential cytoblastic or      thalassemia minor. The patient was Hemoccult negative. She will need      further work up of her anemia as an outpatient and we recommend iron      supplementation for now.  3.  Thrombocytopenia. The patient was admitted with a platelet count of 130.      Her platelets during this admission dropped to 44 on the 23rd, back to      47 and then they were 49,000 on discharge. The patient did not have any      episode of Pick's disease, mucosal bleeding, and she was Hemoccult      negative. Unknown because of thrombocytopenia, it could be related to      medications or drugs. Patient refused to give urine sample for urine      drug screen. Treatment with proton pump inhibitor and Reglan also can      cause hematologic changes. This issue will need to be followed as an      outpatient and patient was advised to keep her follow up appointment and      will need to have a CBC to check her platelets and to check for any      signs of bleeding.  4.  Diabetes. The patient's CBG on discharge was 176. Her CBGs remained in      the 200 range. Her hemoglobin A1c on admission was 8.3. Added Avandia      prior to patient's discharge to optimize her regimen for better control.      Her diabetes needs to be followed as an outpatient and her medication      needs to be  titrated accordingly.  5.  Chronic pain. Will discharge the patient on p.o. Percocet. She has      chronic pain issues with chronic narcotic dependence. She will need to      establish contact with a primary care [name] or follow up with the      pain management clinic. She reports that has an appointment with her new      primary care [name] on January 3. I will give Percocet prescription     enough to cover until her next  appointment, to combine with ibuprofen      OTC.   DISPOSITION:  The patient was discharged home in stable condition. I had  talked to her the night prior to discharge and she was agreed to be  discharged home and was going to talk with her husband to pick her up and  take her home. Today when she was informed about her discharge her husband  was present and she refused to go home with him. She reported that he has  been drinking and also patient was offered a bus or a taxi voucher by care  management and she refused to give her address. The patient had a urine  culture that showed 80,000 colonies of mixed bacteria but none predominant.  She had a lipase that was normal with a level of 30. She had a ferritin that  was low with a level of 6. She had an iron of 138 and total iron binding  capacity of 441 with a percent saturation of 31. Her amylase was slightly  increased to 141. She had a lipid panel that showed cholesterol 146,  triglycerides 216, LDL 77, HDL 26. Her CBC on December 24 showed a white  blood cell count of 11, hemoglobin 8.4, hematocrit 26.8, platelets 49.  Sodium was 137, potassium 3.7, chloride 104, CO2 24, BUN 4, creatinine 0.6,  glucose 229, and calcium 9.2.      Sharin Grave, MD    ______________________________  [name], M.D.    AM/MEDQ  D:  06/17/2005  T:  06/19/2005  Job:  [id number]

## 2010-11-10 NOTE — Discharge Summary (Signed)
NAME:  [name], [name]                         ACCOUNT NO.:  [id number]   MEDICAL RECORD NO.:  [id number]                   PATIENT TYPE:  INP   LOCATION:  9107                                 FACILITY:  [facility]   PHYSICIAN:  Tanya S. Shawnie Pons, M.D.                DATE OF BIRTH:  17-Sep-1975   DATE OF ADMISSION:  10/04/2003  DATE OF DISCHARGE:  10/10/2003                                 DISCHARGE SUMMARY   LABORATORY DATA:  Uric acid 6.1 on October 08, 2003. White blood cell count  9.9. Hemoglobin 8.0 and hematocrit 24.8. Platelet count 191,000 on October 08, 2003. Sodium 135, potassium 2.5, chloride 104, CO2 24, glucose 217, BUN 3,  creatinine 0.5 on October 08, 2003.   STUDIES:  BPP showed an AFI of 13.6 and a score of 8 of 8. Cervix was 4.3  cm. Ultrasound cord Doppler showed an artery F to D ratio of 2.48, middle  cerebral artery PI 1.52. MCA Dopplers showed normal obstetrical Doppler.   HOSPITAL COURSE:  This is a 35 year old G8, P6-0-1-6 who presented at 36-0  weeks to [facility] for observation. She had previously  been seen in the clinic by Dr. Penne Lash and had a fetal heart tone strip  taken which showed an elevated baseline with some worrisome variables. She  was admitted upstairs and watched and it was decided that she would need a  low transverse Cesarean section. During the section, the patient lost a  significant amount of blood and also became pre-eclamptic. Was transferred  to the Intensive Care Unit and started on magnesium sulfate. While there,  the patient's blood pressure began to normalize as did her urine output. She  did so well after a couple of days that she was transferred down to [facility] and continued to be stable at that point. Her diabetes mellitus was  poorly controlled during the hospitalization, mainly because of poor patient  compliance. The patient, after the surgery, also developed a fever, which  she was placed on Ampicillin and  Gentamycin for a period. On day of  discharge, she was asymptomatic and had no fever for greater than 24 hours.  For chronic back pain, the patient was treated with Percocet and Ibuprofen  for her chronic back pain. On day of discharge, the patient is awake and  alert. Feels good and is asking to go home.   DISCHARGE MEDICATIONS:  1. Glucotrol 10 mg p.o. q.d.  2. Novolog 20 units subcutaneous a.c. and at bedtime.  3. NPH 10 units q.d. with supper.  4. NPH 20 units subcutaneous q.d. a.c.  5. Glucophage 500 mg p.o. b.i.d.  6. Percocet 5/325 1 p.o. q. 4 hours p.r.n. pain.  7. Ibuprofen 600 mg p.o. q. 6 hours p.r.n. pain.  8. Prenatal vitamins 1 p.o. q.d.  9. Iron sulfate 1 p.o. b.i.d.   FOLLOW UP:  The patient  is to follow up in the high risk clinic in 6 weeks.     Obstetrics Resident                       Shelbie Proctor. Shawnie Pons, M.D.    OR/MEDQ  D:  10/10/2003  T:  10/11/2003  Job:  [id number]

## 2010-11-10 NOTE — Op Note (Signed)
NAME:  [name], [name]                           ACCOUNT NO.:  [id number]   MEDICAL RECORD NO.:  [id number]                   PATIENT TYPE:   LOCATION:                                       FACILITY:  [facility]   PHYSICIAN:  Georgina Peer, M.D.              DATE OF BIRTH:   DATE OF PROCEDURE:  04/04/2002  DATE OF DISCHARGE:                                 OPERATIVE REPORT   PREOPERATIVE DIAGNOSES:  1. Pregnancy at 38-6/7.  2. Insulin-dependent diabetes mellitus.  3. Early labor, active.  4. Previous cesarean section, desiring repeat.   POSTOPERATIVE DIAGNOSES:  1. Pregnancy at 38-6/7.  2. Insulin-dependent diabetes mellitus.  3. Early labor, active.  4. Previous cesarean section, desiring repeat.   PROCEDURE:  Repeat low cervical transverse cesarean section.   SURGEON:  Georgina Peer, M.D.   ANESTHESIA:  Spinal, Burnett Corrente, M.D.   ESTIMATED BLOOD LOSS:  700 cc.   COMPLICATIONS:  None.   FINDINGS:  Female 9 pounds 1 ounce at 2119, Apgars 8 and 9.  Tubes and ovaries  normal.   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  No ruptured membranes.  Cervix 6 cm dilated.  The patient  understood risks and complications of repeat cesarean section and was  wishing to proceed.   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  She was taken to the operating  room and given a spinal anesthetic by Dr. Harvest Forest.  She was prepped and draped  in the normal sterile fashion.  Foley catheter placed in her bladder.  A  transverse skin incision was made, taken to the peritoneal cavity in a  normal Pfannenstiel manner.  There were a small amount of omental adhesions  to the anterior abdominal wall which were lysed.  The incision  was widened  sharply and bluntly and a transverse bladder flap was created.  The  transverse uterine incision was made.  The infant was delivered with the aid  of fundal pressure at 2219, a female, cried spontaneously.  Membranes had been  ruptured and were clear.  The cord was doubly clamped and cut.  The infant  was handed to the pediatric team in attendance.  Apgars were assigned as 8  and 9 by the pediatric team.  The placenta and membranes were removed.  The  uterus contracted well.  The incision was without extension.  It was closed  in one layer of running locked Vicryl with a second interrupted figure-of-  eight suture of Vicryl to control bleeding.  Tubes and ovaries were  inspected and found to be normal.  The peritoneum was closed with Vicryl  suture.  The fascia was  closed with Vicryl suture.  The subcutaneous tissue  was closed with plain gut, and the skin was closed with subcuticular  Monocryl.  Sponge, needle and instrument counts were correct, and the  patient was transferred to the recovery area in good condition.  She will be  maintained on the Glucomander for postoperative glycemic control until she  is eating solid food and then she will be switched back to insulin.                                               Georgina Peer, M.D.    JPN/MEDQ  D:  04/04/2002  T:  04/05/2002  Job:  [id number]

## 2010-11-10 NOTE — Discharge Summary (Signed)
   NAME:  [name], [name]                         ACCOUNT NO.:  [id number]   MEDICAL RECORD NO.:  [id number]                   PATIENT TYPE:  INP   LOCATION:  9137                                 FACILITY:  [facility]   PHYSICIAN:  Georgina Peer, M.D.              DATE OF BIRTH:  12-Aug-1975   DATE OF ADMISSION:  04/04/2002  DATE OF DISCHARGE:  04/06/2002                                 DISCHARGE SUMMARY   PREOPERATIVE DIAGNOSES:  1. Pregnancy 38 and 6/7 weeks.  2. Insulin-dependent diabetes mellitus.  3. Early labor, active.  4. Previous cesarean section declining vaginal birth after cesarean,     desiring repeat cesarean section.   DISCHARGE DIAGNOSES:  1. Pregnancy 38 and 6/7 weeks.  2. Insulin-dependent diabetes mellitus.  3. Early labor, active.  4. Previous cesarean section declining vaginal birth after cesarean,     desiring repeat cesarean section.   HISTORY OF PRESENT ILLNESS:  The patient is a 35 year old gravida 7, para 5  with a previous cesarean section for positive HSV desiring repeat cesarean  section.  She underwent a repeat cesarean section on May 05, 2002 at  which time a female infant was delivered.  Apgars 8 and 9.  The operation was  uneventful.  Postoperatively the patient did well.  She was advanced to  regular diet with positive flatus by the second day.  She was started back  on Humalog and Lantus.  Her blood sugars were in the 150-200 range.  Her  postoperative hemoglobin was 11.0 from preoperative 12.6.  Her incision was  clean and dry.  She was wanting to go home on the second day, was afebrile,  and was discharged home with Percocet 5/325, Motrin.  She will follow up in  two weeks in the office.  She will follow up with Dr. Jeri Cos, her  primary care doctor for diabetes management.  She was given a discharge  booklet and discharge instructions.  The incision was closed with  subcuticular absorbent sutures and staples were removed.                                        Georgina Peer, M.D.    JPN/MEDQ  D:  06/16/2002  T:  06/16/2002  Job:  [id number]

## 2011-03-22 LAB — GC/CHLAMYDIA PROBE AMP, GENITAL: GC Probe Amp, Genital: POSITIVE — AB

## 2011-03-22 LAB — WET PREP, GENITAL: Yeast Wet Prep HPF POC: NONE SEEN

## 2011-03-22 LAB — POCT PREGNANCY, URINE: Preg Test, Ur: NEGATIVE

## 2011-03-22 LAB — POCT URINALYSIS DIP (DEVICE)
Ketones, ur: 15 — AB
Nitrite: NEGATIVE
Specific Gravity, Urine: >=1.03
Urobilinogen, UA: 0.2

## 2011-03-23 LAB — POCT I-STAT, CHEM 8
Calcium, Ion: 1.22
Chloride: 107
Glucose, Bld: 165 — ABNORMAL HIGH
HCT: 28 — ABNORMAL LOW
Hemoglobin: 9.5 — ABNORMAL LOW

## 2011-03-23 LAB — DIFFERENTIAL
Eosinophils Absolute: 0.1
Lymphs Abs: 1.5
Neutro Abs: 4.6

## 2011-03-23 LAB — CBC
HCT: 25.1 — ABNORMAL LOW
MCV: 53.6 — ABNORMAL LOW

## 2011-03-23 LAB — RETICULOCYTES: Retic Ct Pct: 1.7

## 2011-03-26 LAB — GLUCOSE, CAPILLARY
Glucose-Capillary: 176 — ABNORMAL HIGH
Glucose-Capillary: 200 — ABNORMAL HIGH
Glucose-Capillary: 206 — ABNORMAL HIGH
Glucose-Capillary: 224 — ABNORMAL HIGH
Glucose-Capillary: 274 — ABNORMAL HIGH

## 2011-03-26 LAB — COMPREHENSIVE METABOLIC PANEL
AST: 15
Alkaline Phosphatase: 56
BUN: 4 — ABNORMAL LOW
BUN: 11
CO2: 29
Calcium: 9
Chloride: 102
Chloride: 105
Creatinine, Ser: 0.54
GFR calc Af Amer: >60
GFR calc non Af Amer: >60
Glucose, Bld: 172 — ABNORMAL HIGH
Potassium: 3.7
Total Bilirubin: 0.4
Total Bilirubin: 0.7

## 2011-03-26 LAB — POCT PREGNANCY, URINE: Preg Test, Ur: NEGATIVE

## 2011-03-26 LAB — DIFFERENTIAL
Basophils Absolute: 0.1
Lymphocytes Relative: 22
Lymphs Abs: 1.6
Neutro Abs: 4.7
Neutrophils Relative %: 65

## 2011-03-26 LAB — URINALYSIS, ROUTINE W REFLEX MICROSCOPIC
Glucose, UA: NEGATIVE
Ketones, ur: NEGATIVE
Nitrite: NEGATIVE
Specific Gravity, Urine: 1.025
pH: 6.5

## 2011-03-26 LAB — HEMOGLOBIN A1C

## 2011-03-26 LAB — CBC
HCT: 25 — ABNORMAL LOW
Hemoglobin: 8.5 — ABNORMAL LOW
MCHC: 29.8 — ABNORMAL LOW
MCHC: 29.3 — ABNORMAL LOW
MCV: 54.5 — ABNORMAL LOW
Platelets: 229
RBC: 4.59
RDW: 25.2 — ABNORMAL HIGH
WBC: 7.2

## 2011-03-26 LAB — AMYLASE: Amylase: 80

## 2011-03-26 LAB — TSH: TSH: 2.081

## 2011-03-28 LAB — DIFFERENTIAL
Basophils Absolute: 0
Lymphs Abs: 1.4
Monocytes Absolute: 0.5
Neutro Abs: 9.8 — ABNORMAL HIGH

## 2011-03-28 LAB — CBC
HCT: 30.6 — ABNORMAL LOW
MCHC: 29.9 — ABNORMAL LOW
MCV: 52.5 — ABNORMAL LOW
Platelets: 277
RDW: 24 — ABNORMAL HIGH

## 2011-03-28 LAB — BASIC METABOLIC PANEL
BUN: 12
CO2: 23
Chloride: 103
Creatinine, Ser: 0.51
Glucose, Bld: 133 — ABNORMAL HIGH

## 2011-03-28 LAB — URINALYSIS, ROUTINE W REFLEX MICROSCOPIC
Glucose, UA: NEGATIVE
Nitrite: NEGATIVE
Protein, ur: 30 — AB
Urobilinogen, UA: 1

## 2011-03-28 LAB — PREGNANCY, URINE: Preg Test, Ur: NEGATIVE

## 2011-03-28 LAB — GLUCOSE, CAPILLARY

## 2011-03-28 LAB — URINE MICROSCOPIC-ADD ON

## 2011-03-28 LAB — RETICULOCYTES: Retic Ct Pct: 2

## 2011-04-04 LAB — LIPASE, BLOOD
Lipase: 28
Lipase: 32

## 2011-04-04 LAB — TSH: TSH: 0.179 — ABNORMAL LOW

## 2011-04-04 LAB — DIFFERENTIAL
Basophils Absolute: 0
Basophils Absolute: 0.1
Basophils Relative: 0
Eosinophils Absolute: 0
Eosinophils Absolute: 0.1
Eosinophils Relative: 0
Lymphocytes Relative: 7 — ABNORMAL LOW
Lymphocytes Relative: 8 — ABNORMAL LOW
Lymphs Abs: 1
Monocytes Absolute: 0.5
Monocytes Relative: 3
Neutro Abs: 7.2
Neutro Abs: 7.9 — ABNORMAL HIGH
Neutro Abs: 8 — ABNORMAL HIGH
Neutrophils Relative %: 87 — ABNORMAL HIGH

## 2011-04-04 LAB — URINE MICROSCOPIC-ADD ON

## 2011-04-04 LAB — COMPREHENSIVE METABOLIC PANEL WITH GFR
ALT: 9
AST: 15
Albumin: 4.1
Alkaline Phosphatase: 64
BUN: 5 — ABNORMAL LOW
CO2: 28
Calcium: 9.5
Chloride: 101
Creatinine, Ser: 0.58
GFR calc Af Amer: >60
GFR calc non Af Amer: >60
Glucose, Bld: 306 — ABNORMAL HIGH
Potassium: 3.6
Sodium: 138
Total Bilirubin: 0.8
Total Protein: 8.4 — ABNORMAL HIGH

## 2011-04-04 LAB — RAPID URINE DRUG SCREEN, HOSP PERFORMED
Amphetamines: NOT DETECTED
Barbiturates: NOT DETECTED
Benzodiazepines: POSITIVE — AB
Tetrahydrocannabinol: NOT DETECTED

## 2011-04-04 LAB — URINALYSIS, ROUTINE W REFLEX MICROSCOPIC
Bilirubin Urine: NEGATIVE
Bilirubin Urine: NEGATIVE
Ketones, ur: 40 — AB
Leukocytes, UA: NEGATIVE
Nitrite: NEGATIVE
Nitrite: NEGATIVE
Protein, ur: >300 — AB
Specific Gravity, Urine: 1.039 — ABNORMAL HIGH
Urobilinogen, UA: 0.2
Urobilinogen, UA: 1
pH: 6.5

## 2011-04-04 LAB — CBC
HCT: 33.2 — ABNORMAL LOW
HCT: 34.4 — ABNORMAL LOW
Hemoglobin: 10.3 — ABNORMAL LOW
MCHC: 31
MCHC: 31.1
MCV: 64.7 — ABNORMAL LOW
MCV: 65.1 — ABNORMAL LOW
Platelets: 419 — ABNORMAL HIGH
Platelets: 520 — ABNORMAL HIGH
Platelets: 526 — ABNORMAL HIGH
RBC: 5.09
RDW: 21.6 — ABNORMAL HIGH
WBC: 8.7
WBC: 9

## 2011-04-04 LAB — COMPREHENSIVE METABOLIC PANEL
AST: 16
Albumin: 4.1
BUN: 5 — ABNORMAL LOW
CO2: 25
Calcium: 9.4
Chloride: 103
Creatinine, Ser: 0.53
GFR calc Af Amer: >60
GFR calc non Af Amer: >60
Total Bilirubin: 0.5

## 2011-04-04 LAB — I-STAT 8, (EC8 V) (CONVERTED LAB)
Glucose, Bld: 308 — ABNORMAL HIGH
HCT: 39
Hemoglobin: 13.3
Operator id: 198171
Potassium: 3.7
Sodium: 139
TCO2: 30

## 2011-04-04 LAB — POCT I-STAT 3, ART BLOOD GAS (G3+)
Acid-Base Excess: 4 — ABNORMAL HIGH
Bicarbonate: 27.1 — ABNORMAL HIGH
O2 Saturation: 96
Operator id: 287601
Patient temperature: 37
TCO2: 28
pCO2 arterial: 34 — ABNORMAL LOW
pH, Arterial: 7.51 — ABNORMAL HIGH
pO2, Arterial: 73 — ABNORMAL LOW

## 2011-04-04 LAB — BASIC METABOLIC PANEL WITH GFR
BUN: 4 — ABNORMAL LOW
CO2: 26
Calcium: 9.1
Chloride: 101
Creatinine, Ser: 0.52
GFR calc Af Amer: >60
GFR calc non Af Amer: >60
Glucose, Bld: 210 — ABNORMAL HIGH
Potassium: 3.3 — ABNORMAL LOW
Sodium: 139

## 2011-04-04 LAB — POCT PREGNANCY, URINE
Operator id: 196461
Preg Test, Ur: NEGATIVE

## 2011-04-04 LAB — CLOSTRIDIUM DIFFICILE EIA: C difficile Toxins A+B, EIA: NEGATIVE

## 2011-04-04 LAB — HIV ANTIBODY (ROUTINE TESTING W REFLEX): HIV: NONREACTIVE

## 2011-04-04 LAB — FERRITIN: Ferritin: 8 — ABNORMAL LOW (ref 10–291)

## 2011-04-04 LAB — ACETAMINOPHEN LEVEL: Acetaminophen (Tylenol), Serum: <10 — ABNORMAL LOW

## 2011-04-04 LAB — KETONES, QUALITATIVE: Acetone, Bld: NEGATIVE

## 2011-04-04 LAB — FECAL LACTOFERRIN, QUANT

## 2011-04-04 LAB — POCT I-STAT CREATININE: Creatinine, Ser: 0.5

## 2011-04-04 LAB — ETHANOL: Alcohol, Ethyl (B): <5

## 2011-04-04 LAB — STOOL CULTURE

## 2011-04-06 LAB — CBC
MCV: 70.2 — ABNORMAL LOW
Platelets: 313
RBC: 4.94
WBC: 10.7 — ABNORMAL HIGH

## 2011-04-06 LAB — COMPREHENSIVE METABOLIC PANEL
ALT: 17
AST: 24
Albumin: 3.9
Alkaline Phosphatase: 64
CO2: 23
Chloride: 103
Creatinine, Ser: 0.59
GFR calc Af Amer: >60
GFR calc non Af Amer: >60
Potassium: 4.1
Total Bilirubin: 0.4

## 2011-04-06 LAB — DIFFERENTIAL
Basophils Relative: 1
Eosinophils Absolute: 0.1
Eosinophils Relative: 1
Lymphs Abs: 0.9
Monocytes Absolute: 0.2
Neutro Abs: 9.4 — ABNORMAL HIGH
Neutrophils Relative %: 88 — ABNORMAL HIGH

## 2011-04-06 LAB — URINE MICROSCOPIC-ADD ON

## 2011-04-06 LAB — URINALYSIS, ROUTINE W REFLEX MICROSCOPIC
Bilirubin Urine: NEGATIVE
Glucose, UA: >1000 — AB
Ketones, ur: NEGATIVE
Leukocytes, UA: NEGATIVE
Protein, ur: NEGATIVE
pH: 6

## 2011-04-06 LAB — PREGNANCY, URINE: Preg Test, Ur: NEGATIVE

## 2011-04-09 LAB — COMPREHENSIVE METABOLIC PANEL
ALT: 12
AST: 18
Albumin: 4.2
Albumin: 4.6
Alkaline Phosphatase: 72
Alkaline Phosphatase: 87
BUN: 5 — ABNORMAL LOW
BUN: 8
CO2: 23
Calcium: 10.7 — ABNORMAL HIGH
Calcium: 9.8
Chloride: 98
Creatinine, Ser: 0.55
Creatinine, Ser: 0.56
GFR calc Af Amer: >60
GFR calc non Af Amer: >60
Glucose, Bld: 340 — ABNORMAL HIGH
Glucose, Bld: 367 — ABNORMAL HIGH
Potassium: 4.1
Potassium: 5
Sodium: 133 — ABNORMAL LOW
Total Bilirubin: 0.9
Total Protein: 8.9 — ABNORMAL HIGH
Total Protein: 9 — ABNORMAL HIGH

## 2011-04-09 LAB — CBC
HCT: 30.4 — ABNORMAL LOW
HCT: 30.8 — ABNORMAL LOW
HCT: 25.4 — ABNORMAL LOW
HCT: 25.7 — ABNORMAL LOW
HCT: 26 — ABNORMAL LOW
HCT: 30.6 — ABNORMAL LOW
HCT: 31.6 — ABNORMAL LOW
HCT: 33.6 — ABNORMAL LOW
HCT: 39.7
Hemoglobin: 9.6 — ABNORMAL LOW
Hemoglobin: 9.7 — ABNORMAL LOW
Hemoglobin: 9.8 — ABNORMAL LOW
Hemoglobin: 10.4 — ABNORMAL LOW
Hemoglobin: 12.4
Hemoglobin: 7.7 — CL
Hemoglobin: 8.1 — ABNORMAL LOW
Hemoglobin: 9.3 — ABNORMAL LOW
Hemoglobin: 9.9 — ABNORMAL LOW
MCHC: 31.5
MCHC: 31.5
MCHC: 31.7
MCHC: 30.4
MCHC: 30.9
MCHC: 31.3
MCV: 72.4 — ABNORMAL LOW
MCV: 73 — ABNORMAL LOW
MCV: 69.6 — ABNORMAL LOW
MCV: 70 — ABNORMAL LOW
MCV: 70.9 — ABNORMAL LOW
MCV: 71.1 — ABNORMAL LOW
MCV: 72.6 — ABNORMAL LOW
MCV: 73.1 — ABNORMAL LOW
MCV: 73.3 — ABNORMAL LOW
MCV: 73.4 — ABNORMAL LOW
Platelets: 404 — ABNORMAL HIGH
Platelets: 426 — ABNORMAL HIGH
Platelets: 447 — ABNORMAL HIGH
Platelets: 102 — ABNORMAL LOW
Platelets: 105 — ABNORMAL LOW
Platelets: 107 — ABNORMAL LOW
Platelets: 216
Platelets: 310
Platelets: 341
Platelets: 52 — ABNORMAL LOW
Platelets: 84 — ABNORMAL LOW
RBC: 4.17
RBC: 4.26
RBC: 3.73 — ABNORMAL LOW
RBC: 4.31
RBC: 4.31
RBC: 4.78
RBC: 4.8
RDW: 25.9 — ABNORMAL HIGH
RDW: 26.4 — ABNORMAL HIGH
RDW: 26.4 — ABNORMAL HIGH
RDW: 27.7 — ABNORMAL HIGH
RDW: 30.1 — ABNORMAL HIGH
RDW: 30.7 — ABNORMAL HIGH
RDW: 30.9 — ABNORMAL HIGH
RDW: 31.4 — ABNORMAL HIGH
WBC: 10.5
WBC: 10.9 — ABNORMAL HIGH
WBC: 12.8 — ABNORMAL HIGH
WBC: 5.6
WBC: 6.4
WBC: 7
WBC: 8.3
WBC: 9.2

## 2011-04-09 LAB — BASIC METABOLIC PANEL
BUN: 7
BUN: 8
BUN: 10
BUN: 3 — ABNORMAL LOW
BUN: 4 — ABNORMAL LOW
BUN: 7
CO2: 25
CO2: 25
CO2: 25
Calcium: 8.8
Calcium: 9.2
Calcium: 9
Calcium: 9.6
Chloride: 103
Chloride: 102
Chloride: 104
Chloride: 105
Creatinine, Ser: 0.52
Creatinine, Ser: 0.65
Creatinine, Ser: 0.43
Creatinine, Ser: 0.65
GFR calc Af Amer: >60
GFR calc Af Amer: >60
GFR calc Af Amer: >60
GFR calc Af Amer: >60
GFR calc non Af Amer: >60
GFR calc non Af Amer: >60
GFR calc non Af Amer: >60
GFR calc non Af Amer: >60
Glucose, Bld: 196 — ABNORMAL HIGH
Glucose, Bld: 318 — ABNORMAL HIGH
Glucose, Bld: 206 — ABNORMAL HIGH
Glucose, Bld: 266 — ABNORMAL HIGH
Glucose, Bld: 317 — ABNORMAL HIGH
Potassium: 3.6
Potassium: 3.2 — ABNORMAL LOW
Potassium: 3.5
Potassium: 3.6
Potassium: 4.2
Sodium: 136
Sodium: 138
Sodium: 136
Sodium: 137
Sodium: 140

## 2011-04-09 LAB — DIFFERENTIAL
Basophils Absolute: 0.1
Basophils Absolute: 0.1
Basophils Relative: 0
Basophils Relative: 1
Eosinophils Absolute: 0
Eosinophils Absolute: 0.1
Eosinophils Relative: 0
Lymphocytes Relative: 10 — ABNORMAL LOW
Lymphocytes Relative: 39
Lymphocytes Relative: 7 — ABNORMAL LOW
Lymphs Abs: 0.9
Lymphs Abs: 2
Monocytes Absolute: 0.4
Monocytes Absolute: 0.5
Monocytes Absolute: 0.6
Monocytes Relative: 3
Monocytes Relative: 5
Neutro Abs: 17.6 — ABNORMAL HIGH
Neutro Abs: 2.6
Neutro Abs: 7.7
Neutrophils Relative %: 84 — ABNORMAL HIGH
Neutrophils Relative %: 89 — ABNORMAL HIGH

## 2011-04-09 LAB — URINALYSIS, MICROSCOPIC ONLY
Bilirubin Urine: NEGATIVE
Glucose, UA: >1000 — AB
Specific Gravity, Urine: 1.02

## 2011-04-09 LAB — URINALYSIS, ROUTINE W REFLEX MICROSCOPIC
Glucose, UA: >1000 — AB
Specific Gravity, Urine: 1.042 — ABNORMAL HIGH
pH: 6.5

## 2011-04-09 LAB — URINE CULTURE

## 2011-04-09 LAB — LIPASE, BLOOD: Lipase: 96 — ABNORMAL HIGH

## 2011-04-09 LAB — GLUCOSE, RANDOM
Glucose, Bld: 310 — ABNORMAL HIGH
Glucose, Bld: 301 — ABNORMAL HIGH

## 2011-04-09 LAB — CLOSTRIDIUM DIFFICILE EIA: C difficile Toxins A+B, EIA: NEGATIVE

## 2011-04-09 LAB — FECAL LACTOFERRIN, QUANT: Fecal Lactoferrin: NEGATIVE

## 2011-04-09 LAB — RAPID URINE DRUG SCREEN, HOSP PERFORMED
Barbiturates: NOT DETECTED
Benzodiazepines: POSITIVE — AB
Cocaine: NOT DETECTED
Opiates: POSITIVE — AB

## 2011-04-09 LAB — SICKLE CELL SCREEN: Sickle Cell Screen: NEGATIVE

## 2011-04-09 LAB — CROSSMATCH

## 2011-04-09 LAB — URINE MICROSCOPIC-ADD ON

## 2011-04-09 LAB — POCT CARDIAC MARKERS
Operator id: 146091
Troponin i, poc: <0.05

## 2011-04-09 LAB — POCT PREGNANCY, URINE: Preg Test, Ur: NEGATIVE

## 2011-04-09 LAB — HEMOGLOBIN A1C: Hgb A1c MFr Bld: 10 — ABNORMAL HIGH

## 2011-04-09 LAB — RETICULOCYTES
RBC.: 5.42 — ABNORMAL HIGH
Retic Ct Pct: 2.5

## 2011-04-09 LAB — ABO/RH: ABO/RH(D): B POS

## 2011-04-09 LAB — PROTIME-INR
INR: 1.1
Prothrombin Time: 14.1

## 2011-04-09 LAB — PREGNANCY, URINE: Preg Test, Ur: NEGATIVE

## 2011-04-10 LAB — CBC
HCT: 26.7 — ABNORMAL LOW
HCT: 26.9 — ABNORMAL LOW
Hemoglobin: 8.3 — ABNORMAL LOW
Hemoglobin: 8.4 — ABNORMAL LOW
MCHC: 31
MCHC: 31.1
MCV: 70 — ABNORMAL LOW
MCV: 70.2 — ABNORMAL LOW
RBC: 3.72 — ABNORMAL LOW
RBC: 3.83 — ABNORMAL LOW
RBC: 4.24
RDW: 31 — ABNORMAL HIGH
RDW: 31.6 — ABNORMAL HIGH
WBC: 11.2 — ABNORMAL HIGH

## 2011-04-10 LAB — BASIC METABOLIC PANEL
CO2: 23
Calcium: 8.4
Chloride: 107
Glucose, Bld: 159 — ABNORMAL HIGH
Potassium: 3.5
Sodium: 137

## 2011-04-10 LAB — CULTURE, ROUTINE-ABSCESS

## 2011-04-10 LAB — I-STAT 8, (EC8 V) (CONVERTED LAB)
Acid-Base Excess: 2
BUN: <3 — ABNORMAL LOW
Bicarbonate: 17.5 — ABNORMAL LOW
Glucose, Bld: 401 — ABNORMAL HIGH
Operator id: 270651
Operator id: 282201
Potassium: 3.1 — ABNORMAL LOW
Sodium: 143
TCO2: 26
pCO2, Ven: 35.3 — ABNORMAL LOW
pH, Ven: 7.496 — ABNORMAL HIGH

## 2011-04-10 LAB — LIPID PANEL
Cholesterol: 99
LDL Cholesterol: 29
Total CHOL/HDL Ratio: 4

## 2011-04-10 LAB — URINALYSIS, ROUTINE W REFLEX MICROSCOPIC
Bilirubin Urine: NEGATIVE
Glucose, UA: 100 — AB
Glucose, UA: 100 — AB
Hgb urine dipstick: NEGATIVE
Leukocytes, UA: NEGATIVE
Nitrite: NEGATIVE
Nitrite: NEGATIVE
Protein, ur: >300 — AB
Specific Gravity, Urine: 1.013
Specific Gravity, Urine: 1.046 — ABNORMAL HIGH
pH: 6
pH: 6.5

## 2011-04-10 LAB — DIFFERENTIAL
Basophils Absolute: 0
Basophils Absolute: 0
Basophils Relative: 0
Eosinophils Absolute: 0
Lymphocytes Relative: 13
Lymphocytes Relative: 7 — ABNORMAL LOW
Monocytes Relative: 5
Neutro Abs: 6.9
Neutro Abs: 9 — ABNORMAL HIGH
Neutrophils Relative %: 82 — ABNORMAL HIGH

## 2011-04-10 LAB — POCT I-STAT CREATININE: Creatinine, Ser: 0.4

## 2011-04-10 LAB — URINE MICROSCOPIC-ADD ON

## 2011-04-10 LAB — URINE CULTURE

## 2011-04-10 LAB — HOMOCYSTEINE: Homocysteine: 6.1

## 2011-04-12 LAB — COMPREHENSIVE METABOLIC PANEL
AST: 19
Albumin: 4.5
BUN: 5 — ABNORMAL LOW
Chloride: 104
Creatinine, Ser: 0.59
GFR calc Af Amer: >60
Potassium: 4
Total Bilirubin: 1
Total Protein: 9.1 — ABNORMAL HIGH

## 2011-04-12 LAB — DIFFERENTIAL
Basophils Relative: 0
Eosinophils Relative: 0
Lymphocytes Relative: 5 — ABNORMAL LOW
Neutro Abs: 9.1 — ABNORMAL HIGH
Neutrophils Relative %: 94 — ABNORMAL HIGH

## 2011-04-12 LAB — URINALYSIS, ROUTINE W REFLEX MICROSCOPIC
Bilirubin Urine: NEGATIVE
Glucose, UA: >1000 — AB
Ketones, ur: 15 — AB
Leukocytes, UA: NEGATIVE
Nitrite: NEGATIVE
Protein, ur: >300 — AB
Specific Gravity, Urine: >1.046 — ABNORMAL HIGH
Urobilinogen, UA: 0.2
pH: 6.5

## 2011-04-12 LAB — CBC
HCT: 34.3 — ABNORMAL LOW
Platelets: 296
RDW: 30.9 — ABNORMAL HIGH
WBC: 9.7

## 2011-04-12 LAB — RETICULOCYTES
RBC.: 4.81
Retic Count, Absolute: 125.1
Retic Ct Pct: 2.6

## 2011-04-12 LAB — LIPASE, BLOOD: Lipase: 30

## 2011-04-12 LAB — URINE MICROSCOPIC-ADD ON

## 2011-10-12 ENCOUNTER — Emergency Department (INDEPENDENT_AMBULATORY_CARE_PROVIDER_SITE_OTHER)
Admission: EM | Admit: 2011-10-12 | Discharge: 2011-10-12 | Disposition: A | Discharge status: Home / Self Care | Payer: Medicare Other | Source: Home / Self Care

## 2011-10-12 ENCOUNTER — Encounter (HOSPITAL_COMMUNITY): Payer: Self-pay | Admitting: *Deleted

## 2011-10-12 DIAGNOSIS — J309 Allergic rhinitis, unspecified: Secondary | ICD-10-CM

## 2011-10-12 DIAGNOSIS — N760 Acute vaginitis: Secondary | ICD-10-CM

## 2011-10-12 LAB — POCT URINALYSIS DIP (DEVICE)
Glucose, UA: 500 mg/dL — AB
Leukocytes, UA: NEGATIVE
Nitrite: NEGATIVE
Protein, ur: 30 mg/dL — AB
Urobilinogen, UA: 0.2 mg/dL (ref 0.0–1.0)

## 2011-10-12 LAB — GLUCOSE, CAPILLARY: Glucose-Capillary: 160 mg/dL — ABNORMAL HIGH (ref 70–99)

## 2011-10-12 LAB — POCT PREGNANCY, URINE: Preg Test, Ur: NEGATIVE

## 2011-10-12 MED ORDER — CEFTRIAXONE SODIUM 250 MG IJ SOLR
250.0000 mg | Freq: Once | INTRAMUSCULAR | Status: AC
  Administered 2011-10-12: 250 mg via INTRAMUSCULAR

## 2011-10-12 MED ORDER — LORATADINE 10 MG PO TABS: 10.0000 mg | ORAL_TABLET | Freq: Every day | ORAL | Status: DC

## 2011-10-12 MED ORDER — METRONIDAZOLE 500 MG PO TABS: 500.0000 mg | ORAL_TABLET | Freq: Two times a day (BID) | ORAL | Status: AC

## 2011-10-12 MED ORDER — AZITHROMYCIN 250 MG PO TABS
1000.0000 mg | ORAL_TABLET | Freq: Once | ORAL | Status: AC
  Administered 2011-10-12: 1000 mg via ORAL

## 2011-10-12 MED ORDER — AZITHROMYCIN 250 MG PO TABS
ORAL_TABLET | ORAL | Status: AC
  Filled 2011-10-12: qty 4

## 2011-10-12 MED ORDER — FLUCONAZOLE 150 MG PO TABS: 150.0000 mg | ORAL_TABLET | Freq: Once | ORAL | Status: AC

## 2011-10-12 MED ORDER — CEFTRIAXONE SODIUM 250 MG IJ SOLR
INTRAMUSCULAR | Status: AC
  Filled 2011-10-12: qty 250

## 2011-10-12 NOTE — ED Notes (Signed)
Pt  Reports  Symptoms  Of  Vaginal  Discharge    Low  abd  Pain     Burning  Sensation        Vaginal  Area         She  Has  Had  These    Foe   Several  Days       She  Is  Ambulating  Upright  With a  Steady  Fluid  Gate

## 2011-10-12 NOTE — ED Notes (Signed)
CBG DONE WITH RESULTS OF 160

## 2011-10-12 NOTE — ED Provider Notes (Signed)
History     CSN: [id number]  Arrival date & time 10/12/11  1257   None     Chief Complaint  Patient presents with  . Vaginal Discharge    (Consider location/radiation/quality/duration/timing/severity/associated sxs/prior treatment) HPI Comments: Patient presents today with complaints of vaginal discharge and fishy order for "several days." Patient states that she and her husband keep passing this back and forth. He began treatment 5 days ago for trichomonas and one other infection that she cannot recall. She is intermittent sharp stabbing pains in her lower abdomen, and intermittent urinary discomfort as well. Patient also reports allergy symptoms and requests a prescription. She complains of itchy eyes and nasal congestion for one week. No fever or cough. She is not taking anything currently for her symptoms.   Past Medical History  Diagnosis Date  . Diabetes mellitus     Past Surgical History  Procedure Date  . Cholecystectomy   . Cesarean section     History reviewed. No pertinent family history.  History  Substance Use Topics  . Smoking status: Not on file  . Smokeless tobacco: Not on file  . Alcohol Use:     OB History    Grav Para Term Preterm Abortions TAB SAB Ect Mult Living                  Review of Systems  Constitutional: Negative for fever, chills and fatigue.  HENT: Positive for congestion, rhinorrhea and sneezing. Negative for ear pain, sore throat and sinus pressure.   Respiratory: Negative for cough, shortness of breath and wheezing.   Cardiovascular: Negative for chest pain and palpitations.  Gastrointestinal: Negative for nausea and vomiting.  Genitourinary: Positive for dysuria. Negative for frequency.    Allergies  Ciprofloxacin  Home Medications   Current Outpatient Rx  Name Route Sig Dispense Refill  . FERROUS SULFATE 325 (65 FE) MG PO TABS Oral Take 325 mg by mouth daily with breakfast.    . FOLIC ACID 1 MG PO TABS Oral Take 1 mg by  mouth daily.    Marland Kitchen METFORMIN HCL 1000 MG PO TABS Oral Take 1,000 mg by mouth 2 (two) times daily with a meal.    . MULTIVITAMINS PO CAPS Oral Take 1 capsule by mouth daily.    Marland Kitchen FLUCONAZOLE 150 MG PO TABS Oral Take 1 tablet (150 mg total) by mouth once. 1 tablet 0  . LORATADINE 10 MG PO TABS Oral Take 1 tablet (10 mg total) by mouth daily. 30 tablet 0  . METRONIDAZOLE 500 MG PO TABS Oral Take 1 tablet (500 mg total) by mouth 2 (two) times daily. 14 tablet 0    BP 108/61  Pulse 102  Temp(Src) 98.2 F (36.8 C) (Oral)  Resp 12  SpO2 98%  LMP 10/05/2011  Physical Exam  Nursing note and vitals reviewed. Constitutional: She appears well-developed and well-nourished. No distress.  HENT:  Head: Normocephalic and atraumatic.  Right Ear: Tympanic membrane, external ear and ear canal normal.  Left Ear: Tympanic membrane, external ear and ear canal normal.  Nose: Nose normal.  Mouth/Throat: Uvula is midline, oropharynx is clear and moist and mucous membranes are normal. No oropharyngeal exudate, posterior oropharyngeal edema or posterior oropharyngeal erythema.  Neck: Neck supple.  Cardiovascular: Normal rate, regular rhythm and normal heart sounds.   Pulmonary/Chest: Effort normal and breath sounds normal. No respiratory distress.  Genitourinary: Uterus normal. There is no rash, tenderness, lesion or injury on the right labia. There is no rash,  tenderness, lesion or injury on the left labia. Cervix exhibits no motion tenderness, no discharge and no friability. Right adnexum displays no mass, no tenderness and no fullness. Left adnexum displays no mass, no tenderness and no fullness. No erythema, tenderness or bleeding around the vagina. No foreign body around the vagina. No signs of injury around the vagina. Vaginal discharge found.  Lymphadenopathy:    She has no cervical adenopathy.  Neurological: She is alert.  Skin: Skin is warm and dry.  Psychiatric: She has a normal mood and affect.     ED Course  Procedures (including critical care time)  Labs Reviewed  POCT URINALYSIS DIP (DEVICE) - Abnormal; Notable for the following:    Glucose, UA 500 (*)    Ketones, ur TRACE (*)    Protein, ur 30 (*)    All other components within normal limits  GLUCOSE, CAPILLARY - Abnormal; Notable for the following:    Glucose-Capillary 160 (*)    All other components within normal limits  POCT PREGNANCY, URINE  GC/CHLAMYDIA PROBE AMP, GENITAL  WET PREP, GENITAL   No results found.   1. Vaginitis   2. Allergic rhinitis       MDM  Vaginal discharge, vaginal odor and seasonal allergy symptoms. Husband currently being treated for trich and another unknown STD.         Melody Comas, [place] 10/12/11 [phone]

## 2011-10-12 NOTE — Discharge Instructions (Signed)
No sexual activity for one week. You will be notified of any abnormal culture results. Return as needed.  Allergic Rhinitis Allergic rhinitis is when the mucous membranes in the nose respond to allergens. Allergens are particles in the air that cause your body to have an allergic reaction. This causes you to release allergic antibodies. Through a chain of events, these eventually cause you to release histamine into the blood stream (hence the use of antihistamines). Although meant to be protective to the body, it is this release that causes your discomfort, such as frequent sneezing, congestion and an itchy runny nose.  CAUSES  The pollen allergens may come from grasses, trees, and weeds. This is seasonal allergic rhinitis, or "hay fever." Other allergens cause year-round allergic rhinitis (perennial allergic rhinitis) such as house dust mite allergen, pet dander and mold spores.  SYMPTOMS   Nasal stuffiness (congestion).   Runny, itchy nose with sneezing and tearing of the eyes.   There is often an itching of the mouth, eyes and ears.  It cannot be cured, but it can be controlled with medications. DIAGNOSIS  If you are unable to determine the offending allergen, skin or blood testing may find it. TREATMENT   Avoid the allergen.   Medications and allergy shots (immunotherapy) can help.   Hay fever may often be treated with antihistamines in pill or nasal spray forms. Antihistamines block the effects of histamine. There are over-the-counter medicines that may help with nasal congestion and swelling around the eyes. Check with your caregiver before taking or giving this medicine.  If the treatment above does not work, there are many new medications your caregiver can prescribe. Stronger medications may be used if initial measures are ineffective. Desensitizing injections can be used if medications and avoidance fails. Desensitization is when a patient is given ongoing shots until the body  becomes less sensitive to the allergen. Make sure you follow up with your caregiver if problems continue. SEEK MEDICAL CARE IF:   You develop fever (more than 100.5 F (38.1 C).   You develop a cough that does not stop easily (persistent).   You have shortness of breath.   You start wheezing.   Symptoms interfere with normal daily activities.  Document Released: 03/06/2001 Document Revised: 05/31/2011 Document Reviewed: 09/15/2008 [facility] Patient Information 2012 [place], [place].  Allergic Rhinitis Allergic rhinitis is when the mucous membranes in the nose respond to allergens. Allergens are particles in the air that cause your body to have an allergic reaction. This causes you to release allergic antibodies. Through a chain of events, these eventually cause you to release histamine into the blood stream (hence the use of antihistamines). Although meant to be protective to the body, it is this release that causes your discomfort, such as frequent sneezing, congestion and an itchy runny nose.  CAUSES  The pollen allergens may come from grasses, trees, and weeds. This is seasonal allergic rhinitis, or "hay fever." Other allergens cause year-round allergic rhinitis (perennial allergic rhinitis) such as house dust mite allergen, pet dander and mold spores.  SYMPTOMS   Nasal stuffiness (congestion).   Runny, itchy nose with sneezing and tearing of the eyes.   There is often an itching of the mouth, eyes and ears.  It cannot be cured, but it can be controlled with medications. DIAGNOSIS  If you are unable to determine the offending allergen, skin or blood testing may find it. TREATMENT   Avoid the allergen.   Medications and allergy shots (immunotherapy) can  help.   Hay fever may often be treated with antihistamines in pill or nasal spray forms. Antihistamines block the effects of histamine. There are over-the-counter medicines that may help with nasal congestion and swelling around  the eyes. Check with your caregiver before taking or giving this medicine.  If the treatment above does not work, there are many new medications your caregiver can prescribe. Stronger medications may be used if initial measures are ineffective. Desensitizing injections can be used if medications and avoidance fails. Desensitization is when a patient is given ongoing shots until the body becomes less sensitive to the allergen. Make sure you follow up with your caregiver if problems continue. SEEK MEDICAL CARE IF:   You develop fever (more than 100.5 F (38.1 C).   You develop a cough that does not stop easily (persistent).   You have shortness of breath.   You start wheezing.   Symptoms interfere with normal daily activities.  Document Released: 03/06/2001 Document Revised: 05/31/2011 Document Reviewed: 09/15/2008 [facility] Patient Information 2012 [place], [place].

## 2011-10-13 LAB — GC/CHLAMYDIA PROBE AMP, GENITAL: GC Probe Amp, Genital: NEGATIVE

## 2011-10-13 NOTE — ED Provider Notes (Signed)
Medical screening examination/treatment/procedure(s) were performed by resident physician or non-physician practitioner and as supervising physician I was immediately available for consultation/collaboration.   Barkley Bruns MD.    Linna Hoff, MD 10/13/11 1149

## 2011-10-15 NOTE — ED Notes (Signed)
Wet prep shows many WBCs, adequately treated with flagyl.

## 2011-11-05 ENCOUNTER — Encounter (HOSPITAL_COMMUNITY): Payer: Self-pay | Admitting: *Deleted

## 2011-11-05 ENCOUNTER — Inpatient Hospital Stay (HOSPITAL_COMMUNITY)
Admission: AD | Admit: 2011-11-05 | Discharge: 2011-11-06 | Disposition: A | Discharge status: Home / Self Care | Payer: Medicare Other | Source: Ambulatory Visit | Attending: Obstetrics & Gynecology | Admitting: Obstetrics & Gynecology

## 2011-11-05 DIAGNOSIS — R109 Unspecified abdominal pain: Secondary | ICD-10-CM | POA: Insufficient documentation

## 2011-11-05 DIAGNOSIS — N760 Acute vaginitis: Secondary | ICD-10-CM

## 2011-11-05 DIAGNOSIS — A499 Bacterial infection, unspecified: Secondary | ICD-10-CM | POA: Insufficient documentation

## 2011-11-05 DIAGNOSIS — E119 Type 2 diabetes mellitus without complications: Secondary | ICD-10-CM | POA: Insufficient documentation

## 2011-11-05 DIAGNOSIS — B9689 Other specified bacterial agents as the cause of diseases classified elsewhere: Secondary | ICD-10-CM | POA: Insufficient documentation

## 2011-11-05 HISTORY — DX: Headache: R51

## 2011-11-05 HISTORY — DX: Reserved for concepts with insufficient information to code with codable children: IMO0002

## 2011-11-05 HISTORY — DX: Other seasonal allergic rhinitis: J30.2

## 2011-11-05 HISTORY — DX: Anemia, unspecified: D64.9

## 2011-11-05 HISTORY — DX: Unspecified abnormal cytological findings in specimens from cervix uteri: R87.619

## 2011-11-05 LAB — URINALYSIS, ROUTINE W REFLEX MICROSCOPIC
Glucose, UA: >1000 mg/dL — AB
Leukocytes, UA: NEGATIVE
Nitrite: NEGATIVE
pH: 6 (ref 5.0–8.0)

## 2011-11-05 LAB — POCT PREGNANCY, URINE: Preg Test, Ur: NEGATIVE

## 2011-11-05 LAB — GLUCOSE, CAPILLARY: Glucose-Capillary: 309 mg/dL — ABNORMAL HIGH (ref 70–99)

## 2011-11-05 LAB — URINE MICROSCOPIC-ADD ON

## 2011-11-05 LAB — WET PREP, GENITAL: Trich, Wet Prep: NONE SEEN

## 2011-11-05 MED ORDER — METRONIDAZOLE 500 MG PO TABS
500.0000 mg | ORAL_TABLET | Freq: Once | ORAL | Status: AC
  Administered 2011-11-06: 500 mg via ORAL
  Filled 2011-11-05: qty 1

## 2011-11-05 MED ORDER — METRONIDAZOLE 500 MG PO TABS: 500.0000 mg | ORAL_TABLET | Freq: Two times a day (BID) | ORAL | Status: AC

## 2011-11-05 MED ORDER — FLUCONAZOLE 150 MG PO TABS: 150.0000 mg | ORAL_TABLET | Freq: Once | ORAL | Status: AC

## 2011-11-05 MED ORDER — FLUCONAZOLE 150 MG PO TABS
150.0000 mg | ORAL_TABLET | Freq: Once | ORAL | Status: AC
  Administered 2011-11-06: 150 mg via ORAL
  Filled 2011-11-05: qty 1

## 2011-11-05 MED ORDER — GLUCOSE BLOOD VI STRP: ORAL_STRIP | Status: DC

## 2011-11-05 MED ORDER — GI COCKTAIL ~~LOC~~
30.0000 mL | Freq: Once | ORAL | Status: AC
Start: 2011-11-05 — End: 2011-11-05
  Administered 2011-11-05: 30 mL via ORAL
  Filled 2011-11-05: qty 30

## 2011-11-05 NOTE — MAU Note (Signed)
Pt reports mid abd stabbing pain x 3 days, vaginal discharge with an odor. Back pain.diarrhea. LMP 10/25/2011

## 2011-11-05 NOTE — Discharge Instructions (Signed)
Bacterial Vaginosis Bacterial vaginosis (BV) is a vaginal infection where the normal balance of bacteria in the vagina is disrupted. The normal balance is then replaced by an overgrowth of certain bacteria. There are several different kinds of bacteria that can cause BV. BV is the most common vaginal infection in women of childbearing age. CAUSES   The cause of BV is not fully understood. BV develops when there is an increase or imbalance of harmful bacteria.   Some activities or behaviors can upset the normal balance of bacteria in the vagina and put women at increased risk including:   Having a new sex partner or multiple sex partners.   Douching.   Using an intrauterine device (IUD) for contraception.   It is not clear what role sexual activity plays in the development of BV. However, women that have never had sexual intercourse are rarely infected with BV.  Women do not get BV from toilet seats, bedding, swimming pools or from touching objects around them.  SYMPTOMS   Grey vaginal discharge.   A fish-like odor with discharge, especially after sexual intercourse.   Itching or burning of the vagina and vulva.   Burning or pain with urination.   Some women have no signs or symptoms at all.  DIAGNOSIS  Your caregiver must examine the vagina for signs of BV. Your caregiver will perform lab tests and look at the sample of vaginal fluid through a microscope. They will look for bacteria and abnormal cells (clue cells), a pH test higher than 4.5, and a positive amine test all associated with BV.  RISKS AND COMPLICATIONS   Pelvic inflammatory disease (PID).   Infections following gynecology surgery.   Developing HIV.   Developing herpes virus.  TREATMENT  Sometimes BV will clear up without treatment. However, all women with symptoms of BV should be treated to avoid complications, especially if gynecology surgery is planned. Female partners generally do not need to be treated. However,  BV may spread between female sex partners so treatment is helpful in preventing a recurrence of BV.   BV may be treated with antibiotics. The antibiotics come in either pill or vaginal cream forms. Either can be used with nonpregnant or pregnant women, but the recommended dosages differ. These antibiotics are not harmful to the baby.   BV can recur after treatment. If this happens, a second round of antibiotics will often be prescribed.   Treatment is important for pregnant women. If not treated, BV can cause a premature delivery, especially for a pregnant woman who had a premature birth in the past. All pregnant women who have symptoms of BV should be checked and treated.   For chronic reoccurrence of BV, treatment with a type of prescribed gel vaginally twice a week is helpful.  HOME CARE INSTRUCTIONS   Finish all medication as directed by your caregiver.   Do not have sex until treatment is completed.   Tell your sexual partner that you have a vaginal infection. They should see their caregiver and be treated if they have problems, such as a mild rash or itching.   Practice safe sex. Use condoms. Only have 1 sex partner.  PREVENTION  Basic prevention steps can help reduce the risk of upsetting the natural balance of bacteria in the vagina and developing BV:  Do not have sexual intercourse (be abstinent).   Do not douche.   Use all of the medicine prescribed for treatment of BV, even if the signs and symptoms go away.     Tell your sex partner if you have BV. That way, they can be treated, if needed, to prevent reoccurrence.  SEEK MEDICAL CARE IF:   Your symptoms are not improving after 3 days of treatment.   You have increased discharge, pain, or fever.  MAKE SURE YOU:   Understand these instructions.   Will watch your condition.   Will get help right away if you are not doing well or get worse.  FOR MORE INFORMATION  Division of STD Prevention (DSTDP), Centers for Disease  Control and Prevention: [employer] American [employer] Health Association (ASHA): www.ashastd.org  Document Released: 06/11/2005 Document Revised: 05/31/2011 Document Reviewed: 12/02/2008 [facility] Patient Information 2012 [place], [place].Blood Sugar Monitoring, Adult GLUCOSE METERS FOR SELF-MONITORING OF BLOOD GLUCOSE  It is important to be able to correctly measure your blood sugar (glucose). You can use a blood glucose monitor (a small battery-operated device) to check your glucose level at any time. This allows you and your caregiver to monitor your diabetes and to determine how well your treatment plan is working. The process of monitoring your blood glucose with a glucose meter is called self-monitoring of blood glucose (SMBG). When people with diabetes control their blood sugar, they have better health. To test for glucose with a typical glucose meter, place the disposable strip in the meter. Then place a small sample of blood on the "test strip." The test strip is coated with chemicals that combine with glucose in blood. The meter measures how much glucose is present. The meter displays the glucose level as a number. Several new models can record and store a number of test results. Some models can connect to personal computers to store test results or print them out.  Newer meters are often easier to use than older models. Some meters allow you to get blood from places other than your fingertip. Some new models have automatic timing, error codes, signals, or barcode readers to help with proper adjustment (calibration). Some meters have a large display screen or spoken instructions for people with visual impairments.  INSTRUCTIONS FOR USING GLUCOSE METERS  Wash your hands with soap and warm water, or clean the area with alcohol. Dry your hands completely.   Prick the side of your fingertip with a lancet (a sharp-pointed tool used by hand).   Hold the hand down and gently milk the finger until a  small drop of blood appears. Catch the blood with the test strip.   Follow the instructions for inserting the test strip and using the SMBG meter. Most meters require the meter to be turned on and the test strip to be inserted before applying the blood sample.   Record the test result.   Read the instructions carefully for both the meter and the test strips that go with it. Meter instructions are found in the user manual. Keep this manual to help you solve any problems that may arise. Many meters use "error codes" when there is a problem with the meter, the test strip, or the blood sample on the strip. You will need the manual to understand these error codes and fix the problem.   New devices are available such as laser lancets and meters that can test blood taken from "alternative sites" of the body, other than fingertips. However, you should use standard fingertip testing if your glucose changes rapidly. Also, use standard testing if:   You have eaten, exercised, or taken insulin in the past 2 hours.   You think your glucose is low.  You tend to not feel symptoms of low blood glucose (hypoglycemia).   You are ill or under stress.   Clean the meter as directed by the manufacturer.   Test the meter for accuracy as directed by the manufacturer.   Take your meter with you to your caregiver's office. This way, you can test your glucose in front of your caregiver to make sure you are using the meter correctly. Your caregiver can also take a sample of blood to test using a routine lab method. If values on the glucose meter are close to the lab results, you and your caregiver will see that your meter is working well and you are using good technique. Your caregiver will advise you about what to do if the results do not match.  FREQUENCY OF TESTING  Your caregiver will tell you how often you should check your blood glucose. This will depend on your type of diabetes, your current level of diabetes  control, and your types of medicines. The following are general guidelines, but your care plan may be different. Record all your readings and the time of day you took them for review with your caregiver.   Diabetes type 1.   When you are using insulin with good diabetic control (either multiple daily injections or via a pump), you should check your glucose 4 times a day.   If your diabetes is not well controlled, you may need to monitor more frequently, including before meals and 2 hours after meals, at bedtime, and occasionally between 2 a.m. and 3 a.m.   You should always check your glucose before a dose of insulin or before changing the rate on your insulin pump.   Diabetes type 2.   Guidelines for SMBG in diabetes type 2 are not as well defined.   If you are on insulin, follow the guidelines above.   If you are on medicines, but not insulin, and your glucose is not well controlled, you should test at least twice daily.   If you are not on insulin, and your diabetes is controlled with medicines or diet alone, you should test at least once daily, usually before breakfast.   A weekly profile will help your caregiver advise you on your care plan. The week before your visit, check your glucose before a meal and 2 hours after a meal at least daily. You may want to test before and after a different meal each day so you and your caregiver can tell how well controlled your blood sugars are throughout the course of a 24 hour period.   Gestational diabetes (diabetes during pregnancy).   Frequent testing is often necessary. Accurate timing is important.   If you are not on insulin, check your glucose 4 times a day. Check it before breakfast and 1 hour after the start of each meal.   If you are on insulin, check your glucose 6 times a day. Check it before each meal and 1 hour after the first bite of each meal.   General guidelines.   More frequent testing is required at the start of insulin  treatment. Your caregiver will instruct you.   Test your glucose any time you suspect you have low blood sugar (hypoglycemia).   You should test more often when you change medicines, when you have unusual stress or illness, or in other unusual circumstances.  OTHER THINGS TO KNOW ABOUT GLUCOSE METERS  Measurement Range. Most glucose meters are able to read glucose levels over a  broad range of values from as low as 0 to as high as 600 mg/dL. If you get an extremely high or low reading from your meter, you should first confirm it with another reading. Report very high or very low readings to your caregiver.   Whole Blood Glucose versus Plasma Glucose. Some older home glucose meters measure glucose in your whole blood. In a lab or when using some newer home glucose meters, the glucose is measured in your plasma (one component of blood). The difference can be important. It is important for you and your caregiver to know whether your meter gives its results as "whole blood equivalent" or "plasma equivalent."   Display of High and Low Glucose Values. Part of learning how to operate a meter is understanding what the meter results mean. Know how high and low glucose concentrations are displayed on your meter.   Factors that Affect Glucose Meter Performance. The accuracy of your test results depends on many factors and varies depending on the brand and type of meter. These factors include:   Low red blood cell count (anemia).   Substances in your blood (such as uric acid, vitamin C, and others).   Environmental factors (temperature, humidity, altitude).   Name-brand versus generic test strips.   Calibration. Make sure your meter is set up properly. It is a good idea to do a calibration test with a control solution recommended by the manufacturer of your meter whenever you begin using a fresh bottle of test strips. This will help verify the accuracy of your meter.   Improperly stored, expired, or  defective test strips. Keep your strips in a dry place with the lid on.   Soiled meter.   Inadequate blood sample.  NEW TECHNOLOGIES FOR GLUCOSE TESTING Alternative site testing Some glucose meters allow testing blood from alternative sites. These include the:  Upper arm.   Forearm.   Base of the thumb.   Thigh.  Sampling blood from alternative sites may be desirable. However, it may have some limitations. Blood in the fingertips show changes in glucose levels more quickly than blood in other parts of the body. This means that alternative site test results may be different from fingertip test results, not because of the meter's ability to test accurately, but because the actual glucose concentration can be different.  Continuous Glucose Monitoring Devices to measure your blood glucose continuously are available, and others are in development. These methods can be more expensive than self-monitoring with a glucose meter. However, it is uncertain how effective and reliable these devices are. Your caregiver will advise you if this approach makes sense for you. IF BLOOD SUGARS ARE CONTROLLED, PEOPLE WITH DIABETES REMAIN HEALTHIER.  SMBG is an important part of the treatment plan of patients with diabetes mellitus. Below are reasons for using SMBG:   It confirms that your glucose is at a specific, healthy level.   It detects hypoglycemia and severe hyperglycemia.   It allows you and your caregiver to make adjustments in response to changes in lifestyle for individuals requiring medicine.   It determines the need for starting insulin therapy in temporary diabetes that happens during pregnancy (gestational diabetes).  Document Released: 06/14/2003 Document Revised: 05/31/2011 Document Reviewed: 10/05/2010 [facility] Patient Information 2012 [place], [place].

## 2011-11-05 NOTE — MAU Provider Note (Signed)
History     CSN: [id number]  Arrival date and time: 11/05/11 4098   First Provider Initiated Contact with Patient 11/05/11 2214      Chief Complaint  Patient presents with  . Vaginal Discharge   HPI  Pt reports sharp abdominal pain x 3 days.  Pain is intermittent and rated 8/10 when it's bad.  Right now pain is 0/10.  +vaginal discharge, brown.  Denies fever, body aches, or chills.  +back pain.  Patient's last menstrual period was 10/25/2011.  No birth control method at this time.  +sexual active.     Past Medical History  Diagnosis Date  . Diabetes mellitus   . Anemia   . Sickle cell disease   . Headache   . Seasonal allergies   . Abnormal Pap smear     Past Surgical History  Procedure Date  . Cholecystectomy   . Cesarean section     Family History  Problem Relation Age of Onset  . Hypertension Mother   . Diabetes Mother   . Hypertension Maternal Aunt   . Diabetes Maternal Aunt   . Hypertension Maternal Uncle   . Diabetes Maternal Uncle   . Hypertension Maternal Grandmother     History  Substance Use Topics  . Smoking status: Current Everyday Smoker -- 0.5 packs/day  . Smokeless tobacco: Not on file  . Alcohol Use: No    Allergies:  Allergies  Allergen Reactions  . Ciprofloxacin     REACTION: Rash    Prescriptions prior to admission  Medication Sig Dispense Refill  . ferrous sulfate 325 (65 FE) MG tablet Take 325 mg by mouth daily with breakfast.      . folic acid (FOLVITE) 1 MG tablet Take 1 mg by mouth daily.      . insulin regular (NOVOLIN R,HUMULIN R) 100 units/mL injection Inject 70 Units into the skin 2 (two) times daily before a meal.      . loratadine (CLARITIN) 10 MG tablet Take 1 tablet (10 mg total) by mouth daily.  30 tablet  0  . metFORMIN (GLUCOPHAGE) 1000 MG tablet Take 1,000 mg by mouth 2 (two) times daily with a meal.      . Multiple Vitamin (MULTIVITAMIN) capsule Take 1 capsule by mouth daily.        Review of Systems    Gastrointestinal: Positive for abdominal pain.  Genitourinary:       +brown discharge  Musculoskeletal: Positive for back pain.  All other systems reviewed and are negative.   Physical Exam   Blood pressure 135/66, pulse 79, temperature 98.1 F (36.7 C), temperature source Oral, resp. rate 16, height 5' 7.5" (1.715 m), weight 88.905 kg (196 lb), last menstrual period 10/25/2011.  Physical Exam  Constitutional: She is oriented to person, place, and time. She appears well-developed and well-nourished. No distress.  HENT:  Head: Normocephalic.  Neck: Normal range of motion. Neck supple.  Cardiovascular: Normal rate, regular rhythm and normal heart sounds.   Respiratory: Effort normal and breath sounds normal. No respiratory distress.  GI: Soft. She exhibits no mass. There is no tenderness. There is no rebound, no guarding and no CVA tenderness.  Genitourinary: Cervix exhibits no motion tenderness and no discharge. Vaginal discharge (white, creamy) found.  Musculoskeletal: Normal range of motion.  Neurological: She is alert and oriented to person, place, and time.  Skin: Skin is warm and dry.  Psychiatric: She has a normal mood and affect.    MAU Course  Procedures  Results for orders placed during the hospital encounter of 11/05/11 (from the past 24 hour(s))  URINALYSIS, ROUTINE W REFLEX MICROSCOPIC     Status: Abnormal   Collection Time   11/05/11  7:55 PM      Component Value Range   Color, Urine YELLOW  YELLOW    APPearance HAZY (*) CLEAR    Specific Gravity, Urine >1.030 (*) 1.005 - 1.030    pH 6.0  5.0 - 8.0    Glucose, UA >1000 (*) NEGATIVE (mg/dL)   Hgb urine dipstick SMALL (*) NEGATIVE    Bilirubin Urine NEGATIVE  NEGATIVE    Ketones, ur 15 (*) NEGATIVE (mg/dL)   Protein, ur NEGATIVE  NEGATIVE (mg/dL)   Urobilinogen, UA 0.2  0.0 - 1.0 (mg/dL)   Nitrite NEGATIVE  NEGATIVE    Leukocytes, UA NEGATIVE  NEGATIVE   URINE MICROSCOPIC-ADD ON     Status: Abnormal    Collection Time   11/05/11  7:55 PM      Component Value Range   Squamous Epithelial / LPF MANY (*) RARE    WBC, UA 3-6  <3 (WBC/hpf)   RBC / HPF 3-6  <3 (RBC/hpf)   Urine-Other MUCOUS PRESENT    POCT PREGNANCY, URINE     Status: Normal   Collection Time   11/05/11  8:15 PM      Component Value Range   Preg Test, Ur NEGATIVE  NEGATIVE   WET PREP, GENITAL     Status: Abnormal   Collection Time   11/05/11 10:30 PM      Component Value Range   Yeast Wet Prep HPF POC NONE SEEN  NONE SEEN    Trich, Wet Prep NONE SEEN  NONE SEEN    Clue Cells Wet Prep HPF POC MODERATE (*) NONE SEEN    WBC, Wet Prep HPF POC FEW (*) NONE SEEN   GLUCOSE, CAPILLARY     Status: Abnormal   Collection Time   11/05/11 10:57 PM      Component Value Range   Glucose-Capillary 309 (*) 70 - 99 (mg/dL)  COMPREHENSIVE METABOLIC PANEL     Status: Abnormal   Collection Time   11/06/11 12:05 AM      Component Value Range   Sodium 138  135 - 145 (mEq/L)   Potassium 3.1 (*) 3.5 - 5.1 (mEq/L)   Chloride 104  96 - 112 (mEq/L)   CO2 22  19 - 32 (mEq/L)   Glucose, Bld 168 (*) 70 - 99 (mg/dL)   BUN 11  6 - 23 (mg/dL)   Creatinine, Ser 1.61 (*) 0.50 - 1.10 (mg/dL)   Calcium 9.3  8.4 - 09.6 (mg/dL)   Total Protein 7.4  6.0 - 8.3 (g/dL)   Albumin 3.6  3.5 - 5.2 (g/dL)   AST 19  0 - 37 (U/L)   ALT 14  0 - 35 (U/L)   Alkaline Phosphatase 58  39 - 117 (U/L)   Total Bilirubin 0.2 (*) 0.3 - 1.2 (mg/dL)   GFR calc non Af Amer >90  >90 (mL/min)   GFR calc Af Amer >90  >90 (mL/min)  Anion gap 10   Assessment and Plan  Bacterial Vaginosis Diabetes Mellitis - Poor Control  Plan: Flagyl Diflucan (prophylaxis) Follow-up with PCP ASAP  [facility] 11/05/2011, 10:17 PM   Care of pt assumed at 2330.  Pt does not have insulin and states she is in process of getting Medicaid reinstated and resuming care w/ PCP. She  and does  not seem to accurately recall what insulin regimen she had been on in the past, but does have  Metformin.  Novolog 8 Units given per Dr. Penne Lash. No evidence of DKA. Lengthy discussion about importance of diabetes management and PCP/endocrynologist.  F/U in ED for lethargy, uncontrolled blood sugar Medication List  As of 11/06/2011  1:17 AM   START taking these medications         fluconazole 150 MG tablet   Commonly known as: DIFLUCAN   Take 1 tablet (150 mg total) by mouth once.      glucose blood test strip   Use as instructed      metroNIDAZOLE 500 MG tablet   Commonly known as: FLAGYL   Take 1 tablet (500 mg total) by mouth 2 (two) times daily.      terconazole 0.4 % vaginal cream   Commonly known as: TERAZOL 7   Place 1 applicator vaginally at bedtime.         CONTINUE taking these medications         ferrous sulfate 325 (65 FE) MG tablet      folic acid 1 MG tablet   Commonly known as: FOLVITE      insulin regular 100 units/mL injection   Commonly known as: NOVOLIN R,HUMULIN R      loratadine 10 MG tablet   Commonly known as: CLARITIN   Take 1 tablet (10 mg total) by mouth daily.      metFORMIN 1000 MG tablet   Commonly known as: GLUCOPHAGE      multivitamin capsule          Where to get your medications    These are the prescriptions that you need to pick up. We sent them to a specific pharmacy, so you will need to go there to get them.   [address] [place], [place] - [address]    [address] [place] [place] [address]    Phone: [phone]        fluconazole 150 MG tablet   glucose blood test strip   metroNIDAZOLE 500 MG tablet   terconazole 0.4 % vaginal cream            Keyanah Kozicki 11/06/2011 1:15 AM

## 2011-11-06 LAB — COMPREHENSIVE METABOLIC PANEL
AST: 19 U/L (ref 0–37)
BUN: 11 mg/dL (ref 6–23)
CO2: 22 mEq/L (ref 19–32)
Calcium: 9.3 mg/dL (ref 8.4–10.5)
Chloride: 104 mEq/L (ref 96–112)
Creatinine, Ser: 0.48 mg/dL — ABNORMAL LOW (ref 0.50–1.10)
GFR calc Af Amer: >90 mL/min (ref >90)
GFR calc non Af Amer: >90 mL/min (ref >90)
Glucose, Bld: 168 mg/dL — ABNORMAL HIGH (ref 70–99)
Total Bilirubin: 0.2 mg/dL — ABNORMAL LOW (ref 0.3–1.2)

## 2011-11-06 LAB — GC/CHLAMYDIA PROBE AMP, GENITAL
Chlamydia, DNA Probe: NEGATIVE
GC Probe Amp, Genital: NEGATIVE

## 2011-11-06 MED ORDER — TERCONAZOLE 0.4 % VA CREA: 1.0000 | TOPICAL_CREAM | Freq: Every day | VAGINAL | Status: AC

## 2011-11-06 MED ORDER — INSULIN ASPART 100 UNIT/ML ~~LOC~~ SOLN
8.0000 [IU] | Freq: Once | SUBCUTANEOUS | Status: AC
  Administered 2011-11-06: 8 [IU] via SUBCUTANEOUS
  Filled 2011-11-06: qty 1

## 2011-11-08 NOTE — MAU Provider Note (Signed)
Medical Screening exam and patient care preformed by advanced practice provider.  Agree with the above management.  

## 2011-12-02 ENCOUNTER — Inpatient Hospital Stay (HOSPITAL_COMMUNITY)
Admission: AD | Admit: 2011-12-02 | Discharge: 2011-12-02 | Disposition: A | Discharge status: Hospice | Payer: Medicare Other | Source: Ambulatory Visit | Attending: Obstetrics & Gynecology | Admitting: Obstetrics & Gynecology

## 2011-12-02 ENCOUNTER — Encounter (HOSPITAL_COMMUNITY): Payer: Self-pay | Admitting: *Deleted

## 2011-12-02 DIAGNOSIS — L089 Local infection of the skin and subcutaneous tissue, unspecified: Secondary | ICD-10-CM

## 2011-12-02 DIAGNOSIS — L0292 Furuncle, unspecified: Secondary | ICD-10-CM | POA: Insufficient documentation

## 2011-12-02 MED ORDER — IBUPROFEN 800 MG PO TABS: 800.0000 mg | ORAL_TABLET | Freq: Three times a day (TID) | ORAL | Status: AC

## 2011-12-02 MED ORDER — FLUCONAZOLE 150 MG PO TABS: 150.0000 mg | ORAL_TABLET | Freq: Once | ORAL | Status: AC

## 2011-12-02 MED ORDER — SULFAMETHOXAZOLE-TRIMETHOPRIM 800-160 MG PO TABS: 1.0000 | ORAL_TABLET | Freq: Two times a day (BID) | ORAL | Status: AC

## 2011-12-02 MED ORDER — IBUPROFEN 800 MG PO TABS
800.0000 mg | ORAL_TABLET | Freq: Once | ORAL | Status: AC
  Administered 2011-12-02: 800 mg via ORAL
  Filled 2011-12-02: qty 1

## 2011-12-02 NOTE — Progress Notes (Signed)
While doing pelvic exam, pt asked," What does my daughter have wrong with her?"  RN Janann August Responded, " I am unable to give you any information regarding your daughter.  You will have to get that information from your daughter."

## 2011-12-02 NOTE — MAU Note (Signed)
Pt c/o boils that are not draining on the left labia, but says they are going down in size.  She also c/o folliculitis on her bottom that she would like [place] to treat.

## 2011-12-02 NOTE — MAU Note (Signed)
Pt reportts having a "boil on the left side of her vaginal that started about 2 days ago. Pt reports she has some other ones on her buttocks as well. Report she has been told they are folliculitis in the past.

## 2011-12-02 NOTE — MAU Provider Note (Signed)
History     CSN: [id number]  Arrival date & time 12/02/11  1523   None     Chief Complaint  Patient presents with  . Cyst    (Consider location/radiation/quality/duration/timing/severity/associated sxs/prior treatment) HPI  [name] is a 36 y.o. [phone] with c/o multiple boils on her breast, vulva and buttocks for 2 days.  Has had off/on for months, has been tx with PCN, amox, Augmentin, always comes back. No bleeding, discharge, UTI S&S or GI changes.  Requests Rx for her insulin pen and Fiorcet, is out. Does no thave current provider in [place].   Past Medical History  Diagnosis Date  . Diabetes mellitus   . Anemia   . Sickle cell disease   . Headache   . Seasonal allergies   . Abnormal Pap smear   . Sickle cell anemia     Past Surgical History  Procedure Date  . Cholecystectomy   . Cesarean section     Family History  Problem Relation Age of Onset  . Hypertension Mother   . Diabetes Mother   . Hypertension Maternal Aunt   . Diabetes Maternal Aunt   . Hypertension Maternal Uncle   . Diabetes Maternal Uncle   . Hypertension Maternal Grandmother     History  Substance Use Topics  . Smoking status: Current Everyday Smoker -- 0.5 packs/day    Types: Cigarettes  . Smokeless tobacco: Not on file  . Alcohol Use: No    OB History    Grav Para Term Preterm Abortions TAB SAB Ect Mult Living   8 8 7 1  0  0   7      Review of Systems  Constitutional: Negative for fever and chills.  Gastrointestinal: Negative for abdominal distention.  Genitourinary: Positive for genital sores. Negative for dysuria, vaginal bleeding, vaginal discharge and difficulty urinating.    Allergies  Ciprofloxacin  Home Medications  No current outpatient prescriptions on file.  BP 120/68  Pulse 90  Temp(Src) 98.2 F (36.8 C) (Oral)  Resp 16  Ht 5\' 7"  (1.702 m)  Wt 191 lb 9.6 oz (86.909 kg)  BMI 30.01 kg/m2  LMP 11/23/2011  Physical Exam  Constitutional: She is oriented  to person, place, and time. She appears well-developed and well-nourished.  Musculoskeletal: Normal range of motion.  Neurological: She is alert and oriented to person, place, and time.  Skin: Skin is warm and dry.       multiple scattered pustules in various stages of healing on breast, buttocks,thighs and vulva    ED Course  Procedures (including critical care time)  Labs Reviewed - No data to display No results found.   No diagnosis found.    MDM   Rx Bactrim DS BID x 7 days Ibuprofen 800 mg # 20 1 po Q 8 hr prn  Strongly encouraged her to get established with provider for general health care Unable to provide rx for non GYN meds, if needs may try  an UCC.

## 2012-01-31 ENCOUNTER — Inpatient Hospital Stay (HOSPITAL_COMMUNITY)
Admission: AD | Admit: 2012-01-31 | Discharge: 2012-01-31 | Disposition: A | Discharge status: Home / Self Care | Payer: Medicare Other | Source: Ambulatory Visit | Attending: Obstetrics & Gynecology | Admitting: Obstetrics & Gynecology

## 2012-01-31 ENCOUNTER — Encounter (HOSPITAL_COMMUNITY): Payer: Self-pay | Admitting: *Deleted

## 2012-01-31 DIAGNOSIS — L738 Other specified follicular disorders: Secondary | ICD-10-CM | POA: Insufficient documentation

## 2012-01-31 DIAGNOSIS — A499 Bacterial infection, unspecified: Secondary | ICD-10-CM

## 2012-01-31 DIAGNOSIS — B9689 Other specified bacterial agents as the cause of diseases classified elsewhere: Secondary | ICD-10-CM

## 2012-01-31 DIAGNOSIS — N760 Acute vaginitis: Secondary | ICD-10-CM | POA: Insufficient documentation

## 2012-01-31 DIAGNOSIS — L739 Follicular disorder, unspecified: Secondary | ICD-10-CM

## 2012-01-31 DIAGNOSIS — N949 Unspecified condition associated with female genital organs and menstrual cycle: Secondary | ICD-10-CM | POA: Insufficient documentation

## 2012-01-31 DIAGNOSIS — E119 Type 2 diabetes mellitus without complications: Secondary | ICD-10-CM | POA: Insufficient documentation

## 2012-01-31 LAB — CBC WITH DIFFERENTIAL/PLATELET
Basophils Relative: 1 % (ref 0–1)
Eosinophils Relative: 5 % (ref 0–5)
HCT: 32.5 % — ABNORMAL LOW (ref 36.0–46.0)
Hemoglobin: 10.2 g/dL — ABNORMAL LOW (ref 12.0–15.0)
Lymphocytes Relative: 18 % (ref 12–46)
MCH: 18 pg — ABNORMAL LOW (ref 26.0–34.0)
Neutro Abs: 7.9 10*3/uL — ABNORMAL HIGH (ref 1.7–7.7)
Neutrophils Relative %: 72 % (ref 43–77)
RBC: 5.68 MIL/uL — ABNORMAL HIGH (ref 3.87–5.11)

## 2012-01-31 LAB — BASIC METABOLIC PANEL
BUN: 12 mg/dL (ref 6–23)
Chloride: 99 mEq/L (ref 96–112)
Creatinine, Ser: 0.59 mg/dL (ref 0.50–1.10)
Glucose, Bld: 214 mg/dL — ABNORMAL HIGH (ref 70–99)
Potassium: 3.7 mEq/L (ref 3.5–5.1)

## 2012-01-31 LAB — WET PREP, GENITAL

## 2012-01-31 MED ORDER — ACETAMINOPHEN-CODEINE #3 300-30 MG PO TABS: 1.0000 | ORAL_TABLET | Freq: Four times a day (QID) | ORAL | Status: AC | PRN

## 2012-01-31 MED ORDER — SULFAMETHOXAZOLE-TRIMETHOPRIM 800-160 MG PO TABS: 1.0000 | ORAL_TABLET | Freq: Two times a day (BID) | ORAL | Status: AC

## 2012-01-31 MED ORDER — GLUCOSE BLOOD VI STRP: ORAL_STRIP | Status: DC

## 2012-01-31 MED ORDER — FLUCONAZOLE 150 MG PO TABS: 150.0000 mg | ORAL_TABLET | Freq: Once | ORAL | Status: AC

## 2012-01-31 MED ORDER — METRONIDAZOLE 500 MG PO TABS: 500.0000 mg | ORAL_TABLET | Freq: Two times a day (BID) | ORAL | Status: AC

## 2012-01-31 MED ORDER — ACETAMINOPHEN-CODEINE #3 300-30 MG PO TABS
1.0000 | ORAL_TABLET | Freq: Once | ORAL | Status: AC
  Administered 2012-01-31: 1 via ORAL
  Filled 2012-01-31: qty 1

## 2012-01-31 NOTE — MAU Note (Signed)
Patient states she has been having recurrent boils all over her body that hurt. Has been having vaginal discharge that is white to brown with an odor since last week.

## 2012-01-31 NOTE — MAU Provider Note (Signed)
History     CSN: [id number]  Arrival date and time: 01/31/12 1514   First Provider Initiated Contact with Patient 01/31/12 1844      Chief Complaint  Patient presents with  . Vaginal Discharge  . Recurrent Skin Infections   HPI [name] is a 36 y.o. female. She is not pregnant. She presents to MAU with vaginal discharge and irritation. She describes the discharge as yellow with a bad odor. The discharge started a week ago. Last sexual intercourse "a few days ago". Associated symptoms include lower abdominal cramping and low back pain. Patient also complains of skin lesion on inner aspects of thighs and buttocks. She has had same rash before and treated for folliculitis. She also request to be checked for anemia because she has sickle cell and has been feeling very tired. She also request refill on her glucose test strips. The history was provided by the patient.   OB History    Grav Para Term Preterm Abortions TAB SAB Ect Mult Living   8 8 7 1  0  0   7      Past Medical History  Diagnosis Date  . Diabetes mellitus   . Anemia   . Sickle cell disease   . Headache   . Seasonal allergies   . Abnormal Pap smear   . Sickle cell anemia     Past Surgical History  Procedure Date  . Cholecystectomy   . Cesarean section     Family History  Problem Relation Age of Onset  . Hypertension Mother   . Diabetes Mother   . Hypertension Maternal Aunt   . Diabetes Maternal Aunt   . Hypertension Maternal Uncle   . Diabetes Maternal Uncle   . Hypertension Maternal Grandmother     History  Substance Use Topics  . Smoking status: Current Everyday Smoker -- 0.5 packs/day    Types: Cigarettes  . Smokeless tobacco: Not on file  . Alcohol Use: No    Allergies:  Allergies  Allergen Reactions  . Ciprofloxacin     REACTION: Rash    Prescriptions prior to admission  Medication Sig Dispense Refill  . ferrous sulfate 325 (65 FE) MG tablet Take 325 mg by mouth daily with  breakfast.      . folic acid (FOLVITE) 1 MG tablet Take 1 mg by mouth daily.      Marland Kitchen glucose blood (FREESTYLE LITE) test strip Use as instructed  200 each  12  . insulin regular (NOVOLIN R,HUMULIN R) 100 units/mL injection Inject 70 Units into the skin 2 (two) times daily before a meal.      . metFORMIN (GLUCOPHAGE) 1000 MG tablet Take 1,000 mg by mouth 2 (two) times daily with a meal.      . naproxen sodium (ANAPROX) 220 MG tablet Take 220 mg by mouth 2 (two) times daily as needed. For pain        Review of Systems  Constitutional: Positive for malaise/fatigue. Negative for fever, chills and weight loss.  HENT: Negative for ear pain, nosebleeds, congestion and sore throat.   Eyes: Negative for blurred vision, double vision and photophobia.  Respiratory: Negative for cough and wheezing.   Cardiovascular: Negative for chest pain, palpitations and leg swelling.  Gastrointestinal: Positive for abdominal pain (cramping). Negative for nausea, vomiting, diarrhea and constipation.  Genitourinary: Negative for dysuria, urgency, frequency and flank pain.       Vaginal discharge and irritation.  Musculoskeletal: Positive for back pain.  Skin:  Positive for rash.  Neurological: Negative for dizziness, seizures and headaches.  Endo/Heme/Allergies: Does not bruise/bleed easily.  Psychiatric/Behavioral: Negative for depression. Nervous/anxious: treated for anxiety.    Physical Exam   Blood pressure 107/62, pulse 88, temperature 98.4 F (36.9 C), temperature source Oral, resp. rate 18, height 5\' 6"  (1.676 m), weight 185 lb 12.8 oz (84.278 kg), last menstrual period 01/09/2012, SpO2 100.00%.  Physical Exam  Nursing note and vitals reviewed. Constitutional: She is oriented to person, place, and time. She appears well-developed and well-nourished. No distress.  HENT:  Head: Normocephalic and atraumatic.  Eyes: EOM are normal.  Neck: Neck supple.  Cardiovascular: Normal rate.   Respiratory: Effort  normal.  GI: Soft. There is no tenderness.  Genitourinary:       External genitalia with erythema and irritation. Frothy malodorous discharge vaginal vault. Cervix inflamed. No CMT, no adnexal tenderness. Uterus without palpable enlargement.  Musculoskeletal: Normal range of motion.  Neurological: She is alert and oriented to person, place, and time.  Skin: Skin is warm and dry. Rash noted.       Lesions noted inner aspect of thighs and buttocks with erythema. No drainage noted.  Psychiatric: She has a normal mood and affect. Her behavior is normal. Judgment and thought content normal.   Results for orders placed during the hospital encounter of 01/31/12 (from the past 24 hour(s))  POCT PREGNANCY, URINE     Status: Normal   Collection Time   01/31/12  3:56 PM      Component Value Range   Preg Test, Ur NEGATIVE  NEGATIVE  WET PREP, GENITAL     Status: Abnormal   Collection Time   01/31/12  6:45 PM      Component Value Range   Yeast Wet Prep HPF POC NONE SEEN  NONE SEEN   Trich, Wet Prep NONE SEEN  NONE SEEN   Clue Cells Wet Prep HPF POC MODERATE (*) NONE SEEN   WBC, Wet Prep HPF POC FEW (*) NONE SEEN  CBC WITH DIFFERENTIAL     Status: Abnormal (Preliminary result)   Collection Time   01/31/12  7:20 PM      Component Value Range   WBC 11.0 (*) 4.0 - 10.5 K/uL   RBC 5.68 (*) 3.87 - 5.11 MIL/uL   Hemoglobin 10.2 (*) 12.0 - 15.0 g/dL   HCT 57.8 (*) 46.9 - 62.9 %   MCV 57.2 (*) 78.0 - 100.0 fL   MCH 18.0 (*) 26.0 - 34.0 pg   MCHC 31.4  30.0 - 36.0 g/dL   RDW 52.8 (*) 41.3 - 24.4 %   Platelets 296  150 - 400 K/uL   Neutrophils Relative PENDING  43 - 77 %   Neutro Abs PENDING  1.7 - 7.7 K/uL   Band Neutrophils PENDING  0 - 10 %   Lymphocytes Relative PENDING  12 - 46 %   Lymphs Abs PENDING  0.7 - 4.0 K/uL   Monocytes Relative PENDING  3 - 12 %   Monocytes Absolute PENDING  0.1 - 1.0 K/uL   Eosinophils Relative PENDING  0 - 5 %   Eosinophils Absolute PENDING  0.0 - 0.7 K/uL   Basophils  Relative PENDING  0 - 1 %   Basophils Absolute PENDING  0.0 - 0.1 K/uL   WBC Morphology PENDING     RBC Morphology PENDING     Smear Review PENDING     nRBC PENDING  0 /100 WBC   Metamyelocytes Relative  PENDING     Myelocytes PENDING     Promyelocytes Absolute PENDING     Blasts PENDING    BASIC METABOLIC PANEL     Status: Abnormal   Collection Time   01/31/12  7:20 PM      Component Value Range   Sodium 135  135 - 145 mEq/L   Potassium 3.7  3.5 - 5.1 mEq/L   Chloride 99  96 - 112 mEq/L   CO2 25  19 - 32 mEq/L   Glucose, Bld 214 (*) 70 - 99 mg/dL   BUN 12  6 - 23 mg/dL   Creatinine, Ser 8.29  0.50 - 1.10 mg/dL   Calcium 9.7  8.4 - 56.2 mg/dL   GFR calc non Af Amer >90  >90 mL/min   GFR calc Af Amer >90  >90 mL/min   MAU Course  Procedures  Assessment: Bacterial vaginosis   Diabetes   Folliculitis  Plan:  Flagyl Rx   Tylenol #3   Septra DS Medication List  As of 02/01/2012  4:27 AM   START taking these medications         acetaminophen-codeine 300-30 MG per tablet   Commonly known as: TYLENOL #3   Take 1-2 tablets by mouth every 6 (six) hours as needed for pain.      fluconazole 150 MG tablet   Commonly known as: DIFLUCAN   Take 1 tablet (150 mg total) by mouth once.      metroNIDAZOLE 500 MG tablet   Commonly known as: FLAGYL   Take 1 tablet (500 mg total) by mouth 2 (two) times daily.      sulfamethoxazole-trimethoprim 800-160 MG per tablet   Commonly known as: BACTRIM DS,SEPTRA DS   Take 1 tablet by mouth every 12 (twelve) hours.         CONTINUE taking these medications         ferrous sulfate 325 (65 FE) MG tablet      folic acid 1 MG tablet   Commonly known as: FOLVITE      glucose blood test strip   Use as instructed      insulin regular 100 units/mL injection   Commonly known as: NOVOLIN R,HUMULIN R      metFORMIN 1000 MG tablet   Commonly known as: GLUCOPHAGE      naproxen sodium 220 MG tablet   Commonly known as: ANAPROX            Where to get your medications    These are the prescriptions that you need to pick up.   You may get these medications from any pharmacy.         acetaminophen-codeine 300-30 MG per tablet   fluconazole 150 MG tablet   glucose blood test strip   metroNIDAZOLE 500 MG tablet   sulfamethoxazole-trimethoprim 800-160 MG per tablet           Follow-up Information    Schedule an appointment as soon as possible for a visit to follow up. (with your primary care doctor)         Kerrie Buffalo, RN, FNP, [facility] 01/31/2012, 8:01 PM

## 2012-02-01 LAB — GC/CHLAMYDIA PROBE AMP, GENITAL: GC Probe Amp, Genital: NEGATIVE

## 2012-04-20 ENCOUNTER — Emergency Department (INDEPENDENT_AMBULATORY_CARE_PROVIDER_SITE_OTHER)
Admission: EM | Admit: 2012-04-20 | Discharge: 2012-04-20 | Disposition: A | Discharge status: Home / Self Care | Payer: Medicare Other | Source: Home / Self Care | Attending: Emergency Medicine | Admitting: Emergency Medicine

## 2012-04-20 ENCOUNTER — Encounter (HOSPITAL_COMMUNITY): Payer: Self-pay | Admitting: Emergency Medicine

## 2012-04-20 DIAGNOSIS — L0390 Cellulitis, unspecified: Secondary | ICD-10-CM

## 2012-04-20 DIAGNOSIS — E119 Type 2 diabetes mellitus without complications: Secondary | ICD-10-CM

## 2012-04-20 DIAGNOSIS — D571 Sickle-cell disease without crisis: Secondary | ICD-10-CM

## 2012-04-20 DIAGNOSIS — L0291 Cutaneous abscess, unspecified: Secondary | ICD-10-CM

## 2012-04-20 DIAGNOSIS — I10 Essential (primary) hypertension: Secondary | ICD-10-CM

## 2012-04-20 DIAGNOSIS — IMO0002 Reserved for concepts with insufficient information to code with codable children: Secondary | ICD-10-CM

## 2012-04-20 DIAGNOSIS — J019 Acute sinusitis, unspecified: Secondary | ICD-10-CM

## 2012-04-20 DIAGNOSIS — B351 Tinea unguium: Secondary | ICD-10-CM

## 2012-04-20 MED ORDER — MUPIROCIN 2 % EX OINT: TOPICAL_OINTMENT | CUTANEOUS | Status: DC

## 2012-04-20 MED ORDER — CHLORHEXIDINE GLUCONATE 4 % EX LIQD: 60.0000 mL | Freq: Every day | CUTANEOUS | Status: DC | PRN

## 2012-04-20 MED ORDER — BENZONATATE 200 MG PO CAPS: 200.0000 mg | ORAL_CAPSULE | Freq: Three times a day (TID) | ORAL | Status: DC | PRN

## 2012-04-20 MED ORDER — OXYCODONE-ACETAMINOPHEN 10-325 MG PO TABS: 1.0000 | ORAL_TABLET | ORAL | Status: DC | PRN

## 2012-04-20 MED ORDER — CLINDAMYCIN HCL 300 MG PO CAPS: 300.0000 mg | ORAL_CAPSULE | Freq: Four times a day (QID) | ORAL | Status: DC

## 2012-04-20 MED ORDER — FEXOFENADINE-PSEUDOEPHED ER 60-120 MG PO TB12: 1.0000 | ORAL_TABLET | Freq: Two times a day (BID) | ORAL | Status: DC

## 2012-04-20 NOTE — ED Notes (Signed)
Waiting discharge papers 

## 2012-04-20 NOTE — ED Provider Notes (Signed)
Chief Complaint  Patient presents with  . Recurrent Skin Infections    pt states she is taking antibiotic for boils but there is no improvement. red swollen and tender to touch  . Migraine    hx/ no relief with otc meds    History of Present Illness:   The patient is a 36 year old female, a previous patient of the [facility] who presents today with multiple problems: Recurring boils on her skin, cough, migraine headaches, lower back pain, and problems with both great toe toenails. 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  The boils have been going off-and-on for years. Right now she has one on her right buttock and right labia majora. These are both small, not draining any pus, but tender to palpation. She denies any fever or chills. She has tried doxycycline and Septra without relief.  She also has chronic lower back pain. [facility] has been prescribing Percocet for, the 10/325 mg strength. She requested a refill for enough until she could see a primary care physician for which she has an appointment in early December. I explained to her that I could give her a few, but that we could not prescribe her a large amount or on an ongoing basis.  She also has had a one-month history of nasal congestion, postnasal drip, headache, and a productive cough.  She also has thickened toenails on the great toes of both feet and wants something done about that. I told her that this would  fall under the area of a podiatrist, and declined to give her any medication for this tonight.  Review of Systems:  Other than noted above, the patient denies any of the following symptoms: Systemic:  No fever, chills, sweats, weight loss, or fatigue. ENT:  No nasal congestion, rhinorrhea, sore throat, swelling of lips, tongue or throat. Resp:  No cough, wheezing, or shortness of breath. Skin:  No rash, itching, nodules, or suspicious  lesions.  PMFSH:  Past medical history, family history, social history, meds, and allergies were reviewed.  Physical Exam:   Vital signs:  BP 130/72  Pulse 88  Temp 97.9 F (36.6 C) (Oral)  Resp 18  SpO2 100%  LMP 04/13/2012 Gen:  Alert, oriented, in no distress. ENT:  Pharynx clear, no intraoral lesions, moist mucous membranes. Lungs:  Clear to auscultation. Skin:  On the right buttock there is an 8mm nodule which is tender. This is nonfluctuant not draining any pus. Also on her right labia majora she has a tiny bump which is also tender but not draining any pus. She has multiple healed boils on both buttocks. Extremities: She has thickened, discolored toenails of the great toes bilaterally. These were not tender or inflamed.  Assessment:  The primary encounter diagnosis was Abscess. Diagnoses of Onychomycosis, Acute sinusitis, Degenerative disc disease, DIABETES MELLITUS, TYPE II, Sickle cell anemia, and Hypertension were also pertinent to this visit.  Plan:   1.  The following meds were prescribed:   New Prescriptions   BENZONATATE (TESSALON) 200 MG CAPSULE    Take 1 capsule (200 mg total) by mouth 3 (three) times daily as needed for cough.   CHLORHEXIDINE (HIBICLENS) 4 % EXTERNAL LIQUID    Apply 60 mLs (4 application total) topically daily as needed.   CLINDAMYCIN (CLEOCIN) 300 MG CAPSULE    Take 1 capsule (300 mg total) by mouth 4 (four) times daily.   FEXOFENADINE-PSEUDOEPHEDRINE (ALLEGRA-D) 60-120 MG PER TABLET  Take 1 tablet by mouth every 12 (twelve) hours.   MUPIROCIN OINTMENT (BACTROBAN) 2 %    Apply to both nostrils BID for 1 month.   OXYCODONE-ACETAMINOPHEN (PERCOCET) 10-325 MG PER TABLET    Take 1 tablet by mouth every 4 (four) hours as needed for pain.   2.  The patient was instructed in symptomatic care and handouts were given. 3.  The patient was told to return if becoming worse in any way, if no better in 3 or 4 days, and given some red flag symptoms that would  indicate earlier return.     Melinda Likes, MD 04/20/12 2032

## 2012-04-20 NOTE — ED Notes (Signed)
Pt is c/o boils on buttocks and inside vagina.  Areas are red and swollen.  Pt is on antibiotics but she feels like meds are not working.  Having migraine ha no relief with pain med  Pt also c/o nails coming off both great toes.

## 2012-05-11 ENCOUNTER — Encounter (HOSPITAL_COMMUNITY): Payer: Self-pay | Admitting: Emergency Medicine

## 2012-05-11 ENCOUNTER — Emergency Department (INDEPENDENT_AMBULATORY_CARE_PROVIDER_SITE_OTHER)
Admission: EM | Admit: 2012-05-11 | Discharge: 2012-05-11 | Disposition: A | Discharge status: Home / Self Care | Payer: Medicare Other | Source: Home / Self Care | Attending: Emergency Medicine | Admitting: Emergency Medicine

## 2012-05-11 DIAGNOSIS — H00019 Hordeolum externum unspecified eye, unspecified eyelid: Secondary | ICD-10-CM

## 2012-05-11 DIAGNOSIS — M549 Dorsalgia, unspecified: Secondary | ICD-10-CM

## 2012-05-11 MED ORDER — CEPHALEXIN 500 MG PO CAPS: 500.0000 mg | ORAL_CAPSULE | Freq: Three times a day (TID) | ORAL | Status: DC

## 2012-05-11 MED ORDER — DICLOFENAC SODIUM 75 MG PO TBEC: 75.0000 mg | DELAYED_RELEASE_TABLET | Freq: Two times a day (BID) | ORAL | Status: DC

## 2012-05-11 MED ORDER — POLYMYXIN B-TRIMETHOPRIM 10000-0.1 UNIT/ML-% OP SOLN: 1.0000 [drp] | OPHTHALMIC | Status: DC

## 2012-05-11 MED ORDER — CYCLOBENZAPRINE HCL 5 MG PO TABS: 5.0000 mg | ORAL_TABLET | Freq: Three times a day (TID) | ORAL | Status: DC | PRN

## 2012-05-11 NOTE — ED Provider Notes (Addendum)
Chief Complaint  Patient presents with  . Back Pain  . Eye Pain    History of Present Illness:   The patient is a 36 year old female who is well known to me and has had multiple visits here and to the emergency room. She has a history of type 2 diabetes, neuropathy, anemia, anxiety, tobacco abuse, allergic rhinitis, gastroesophageal reflux, recurrent boils, and chronic lower back pain. She presents today with 2 problems: Bilateral eyelid pain and lower back pain.  1. Chronic lower back pain: The patient has a many year long history of pain in the lower back without radiation. She states is stiff and hurts to bend, twist, or left. There is no radiation the pain down the legs. She has chronic numbness of the legs due to neuropathy but no weakness. She has seen an orthopedist for this I  orthopedics. She was told she had disc disease. She has not had an MRI about 3 a half years. She has had no prior history of surgery. She requests a prescription for pain meds and she would like either Percocet or hydrocodone. [facility]. The patient claims to have sickle cell anemia, and this is written in her problem list, but sickle cell testing here has always been negative. The patient claims that this was diagnosed at [facility] and she was given blood transfusions there. I cannot find any record of her ever being at [facility]. She also states that she's been seen by a hematologist in [place], but of course we don't have records from there.  2. Bilateral eyelid pain: She also complains of a one-week history of soreness and swelling of both upper lids. They're tender to touch. She denies any trouble with her vision. She has had some discharge from her eyes.  Review of Systems:  Other than noted above, the patient denies any of the following symptoms: Eye: She has had no redness or blurry vision. Systemic:  No fever, chills,  severe fatigue, or unexplained weight loss. GI:  No abdominal pain, nausea, vomiting, diarrhea, constipation, incontinence of bowel, or blood in stool. GU:  No dysuria, frequency, urgency, or hematuria. No incontinence of urine or difficulty urinating.  M-S:  No neck pain, joint pain, arthritis, or myalgias. Neuro:  No paresthesias, saddle anesthesia, muscular weakness, or progressive neurological deficit.  PMFSH:  Past medical history, family history, social history, meds, and allergies were reviewed. Specifically, there is no history of cancer, major trauma, osteoporosis, immunosuppression, HIV, or IV or injection drug use.   Physical Exam:   Vital signs:  BP 117/69  Pulse 83  Temp 98.2 F (36.8 C) (Oral)  Resp 16  SpO2 100%  LMP 04/13/2012 General:  Alert, oriented, in no distress. Eyes: She has some tiny nodules at the eyelid margins of both upper lids. These are slightly tender to touch. There is no swelling of the eyelids, no conjunctival injection, PERRLA, full EOMs, cornea and anterior chamber appeared normal. Abdomen:  Soft, non-tender.  No organomegaly or mass.  No pulsatile midline abdominal mass or bruit. Back:  There is tenderness to palpation all across entire lumbar spine and over the midline as well. The back has a limited range of motion with 20 of flexion, 10 of lateral bending, 10 of extension, and 30 of rotation with pain. Straight leg raising is negative. Neuro:  Normal muscle strength, sensations and DTRs. Extremities: Pedal pulses were full, there was no edema.  Skin:  Clear, warm and dry.  No rash.  Assessment:  The primary encounter diagnosis was Hordeolum. A diagnosis of Chronic back pain was also pertinent to this visit.  Plan:   1.  The following meds were prescribed:   New Prescriptions   CEPHALEXIN (KEFLEX) 500 MG CAPSULE    Take 1 capsule (500 mg total) by mouth 3 (three) times daily.   CYCLOBENZAPRINE (FLEXERIL) 5 MG TABLET    Take 1 tablet (5 mg  total) by mouth 3 (three) times daily as needed for muscle spasms.   DICLOFENAC (VOLTAREN) 75 MG EC TABLET    Take 1 tablet (75 mg total) by mouth 2 (two) times daily.   TRIMETHOPRIM-POLYMYXIN B (POLYTRIM) OPHTHALMIC SOLUTION    Place 1 drop into both eyes every 4 (four) hours.   2.  The patient was instructed in symptomatic care and handouts were given. I explained to the patient that we do not treat chronic pain with opioid analgesics and that she would need to hear cecum primary care physician or to go to her orthopedist for followup. 3.  The patient was told to return if becoming worse in any way, if no better in 2 weeks, and given some red flag symptoms that would indicate earlier return. 4.  The patient was encouraged to try to be as active as possible and given some exercises to do followed by moist heat.    Reuben Likes, MD 05/11/12 1701  Reuben Likes, MD 05/11/12 [phone]

## 2012-05-11 NOTE — ED Notes (Signed)
Reports bilateral eye pain with a bumps on lid which she noticed a few days ago.  Patient states she is having back pain which also stated a few days ago.  Patient has been seen here a year ago for the back pain.

## 2012-05-12 ENCOUNTER — Emergency Department (INDEPENDENT_AMBULATORY_CARE_PROVIDER_SITE_OTHER)
Admission: EM | Admit: 2012-05-12 | Discharge: 2012-05-12 | Disposition: A | Discharge status: Home / Self Care | Payer: Medicare Other | Source: Home / Self Care

## 2012-05-12 ENCOUNTER — Encounter (HOSPITAL_COMMUNITY): Payer: Self-pay | Admitting: *Deleted

## 2012-05-12 DIAGNOSIS — J309 Allergic rhinitis, unspecified: Secondary | ICD-10-CM

## 2012-05-12 DIAGNOSIS — D649 Anemia, unspecified: Secondary | ICD-10-CM

## 2012-05-12 DIAGNOSIS — L0293 Carbuncle, unspecified: Secondary | ICD-10-CM

## 2012-05-12 DIAGNOSIS — F17200 Nicotine dependence, unspecified, uncomplicated: Secondary | ICD-10-CM

## 2012-05-12 DIAGNOSIS — E119 Type 2 diabetes mellitus without complications: Secondary | ICD-10-CM

## 2012-05-12 DIAGNOSIS — L0292 Furuncle, unspecified: Secondary | ICD-10-CM

## 2012-05-12 DIAGNOSIS — M549 Dorsalgia, unspecified: Secondary | ICD-10-CM

## 2012-05-12 HISTORY — DX: Gout, unspecified: M10.9

## 2012-05-12 LAB — CBC
Hemoglobin: 8.8 g/dL — ABNORMAL LOW (ref 12.0–15.0)
MCH: 17.7 pg — ABNORMAL LOW (ref 26.0–34.0)
MCHC: 31.1 g/dL (ref 30.0–36.0)
Platelets: 232 10*3/uL (ref 150–400)
RDW: 19.3 % — ABNORMAL HIGH (ref 11.5–15.5)

## 2012-05-12 LAB — HEMOGLOBIN A1C: Mean Plasma Glucose: 163 mg/dL — ABNORMAL HIGH (ref <117)

## 2012-05-12 LAB — COMPREHENSIVE METABOLIC PANEL
ALT: 9 U/L (ref 0–35)
AST: 15 U/L (ref 0–37)
Alkaline Phosphatase: 61 U/L (ref 39–117)
CO2: 23 mEq/L (ref 19–32)
Calcium: 8.8 mg/dL (ref 8.4–10.5)
Chloride: 102 mEq/L (ref 96–112)
GFR calc Af Amer: >90 mL/min (ref >90)
GFR calc non Af Amer: >90 mL/min (ref >90)
Glucose, Bld: 216 mg/dL — ABNORMAL HIGH (ref 70–99)
Potassium: 3.7 mEq/L (ref 3.5–5.1)
Sodium: 134 mEq/L — ABNORMAL LOW (ref 135–145)
Total Bilirubin: 0.2 mg/dL — ABNORMAL LOW (ref 0.3–1.2)

## 2012-05-12 LAB — LIPID PANEL
Cholesterol: 133 mg/dL (ref 0–200)
Triglycerides: 250 mg/dL — ABNORMAL HIGH (ref <150)
VLDL: 50 mg/dL — ABNORMAL HIGH (ref 0–40)

## 2012-05-12 MED ORDER — GLUCOSE BLOOD VI STRP: ORAL_STRIP | Status: DC

## 2012-05-12 MED ORDER — FERROUS SULFATE 325 (65 FE) MG PO TABS: 325.0000 mg | ORAL_TABLET | Freq: Every day | ORAL | Status: DC

## 2012-05-12 MED ORDER — CYCLOBENZAPRINE HCL 5 MG PO TABS: 5.0000 mg | ORAL_TABLET | Freq: Three times a day (TID) | ORAL | Status: DC | PRN

## 2012-05-12 MED ORDER — FOLIC ACID 1 MG PO TABS: 1.0000 mg | ORAL_TABLET | Freq: Every day | ORAL | Status: DC

## 2012-05-12 MED ORDER — INSULIN ASPART 100 UNIT/ML ~~LOC~~ SOLN: SUBCUTANEOUS | Status: DC

## 2012-05-12 MED ORDER — FEXOFENADINE-PSEUDOEPHED ER 60-120 MG PO TB12: 1.0000 | ORAL_TABLET | Freq: Two times a day (BID) | ORAL | Status: DC | PRN

## 2012-05-12 MED ORDER — INSULIN PEN NEEDLE 31G X 5 MM MISC: 1.0000 [IU] | Status: DC

## 2012-05-12 MED ORDER — INSULIN GLARGINE 100 UNIT/ML ~~LOC~~ SOLN: 45.0000 [IU] | Freq: Every day | SUBCUTANEOUS | Status: DC

## 2012-05-12 MED ORDER — METFORMIN HCL 1000 MG PO TABS: 1000.0000 mg | ORAL_TABLET | Freq: Two times a day (BID) | ORAL | Status: DC

## 2012-05-12 MED ORDER — IBUPROFEN 800 MG PO TABS: 800.0000 mg | ORAL_TABLET | Freq: Three times a day (TID) | ORAL | Status: DC | PRN

## 2012-05-12 MED ORDER — CEPHALEXIN 500 MG PO CAPS: 500.0000 mg | ORAL_CAPSULE | Freq: Three times a day (TID) | ORAL | Status: DC

## 2012-05-12 MED ORDER — OXYCODONE-ACETAMINOPHEN 10-325 MG PO TABS: 1.0000 | ORAL_TABLET | ORAL | Status: DC | PRN

## 2012-05-12 NOTE — ED Notes (Signed)
Pt called to get her lab results just several hours after leaving the clinic. I explained that the doctor would need to review the results and she would be contacted in 2-3 days, unless something urgent was seen. Pt got extremely agitated stating [facility] and it wasn't her fault she couldn't wait here for the results. I explained to pt that the doctor would have to review the results and currently he was seeing patients. Pt got irate and demanded that I give her the results, when I explained that I could not do that she demanded to speak with someone else. I gave the phone to Simonne Come to talk with the pt.

## 2012-05-12 NOTE — ED Notes (Signed)
Patient came in today requesting lab results.  I explained to patient that they weren't all back.  She stated she wanted to know the results of what was back.  I printed labs.  They were reviewed by Dr Laural Benes.  He stated everything looked pretty good but her blood sugar.  I went over lab results with patient and answered all her questions

## 2012-05-12 NOTE — ED Notes (Signed)
Pt was a pt at [facility] and she is asking for refills of her meds for diabetes and pain.

## 2012-05-13 NOTE — ED Provider Notes (Signed)
History     CSN: [id number]  Arrival date & time 05/12/12  1235   First MD Initiated Contact with Patient 05/12/12 1431      Chief Complaint  Patient presents with  . Diabetes    (Consider location/radiation/quality/duration/timing/severity/associated sxs/prior treatment) HPI  Pt presenting today with multiple complaints and asking for refills of chronic pain medications.  Pt says that she is having some problems with gout and some problems with swollen feet.  Pt says that she is taking her insulin and her blood sugars have been labile.  No hypoglycemia reported.   Pt is taking glucophage with no side effects reported.  Pt says she has sickle cell anemia and has not had any recent crisis events.   Pt needs refills of testing supplies.    Past Medical History  Diagnosis Date  . Diabetes mellitus   . Anemia   . Sickle cell disease   . Headache   . Seasonal allergies   . Abnormal Pap smear   . Sickle cell anemia   . Gout     Past Surgical History  Procedure Date  . Cholecystectomy   . Cesarean section     Family History  Problem Relation Age of Onset  . Hypertension Mother   . Diabetes Mother   . Hypertension Maternal Aunt   . Diabetes Maternal Aunt   . Hypertension Maternal Uncle   . Diabetes Maternal Uncle   . Hypertension Maternal Grandmother     History  Substance Use Topics  . Smoking status: Current Every Day Smoker -- 0.5 packs/day    Types: Cigarettes  . Smokeless tobacco: Not on file  . Alcohol Use: No    OB History    Grav Para Term Preterm Abortions TAB SAB Ect Mult Living   8 8 7 1  0  0   7      Review of Systems  Constitutional: Negative.   HENT: Positive for congestion and rhinorrhea. Negative for sneezing.   Eyes: Negative.   Respiratory: Negative.   Cardiovascular: Negative.   Gastrointestinal: Negative.   Genitourinary: Negative.   Musculoskeletal: Positive for joint swelling.       Complains of feet swelling   Neurological:  Negative.   Hematological: Negative.   Psychiatric/Behavioral: Negative.     Allergies  Ciprofloxacin  Home Medications   Current Outpatient Rx  Name  Route  Sig  Dispense  Refill  . CEPHALEXIN 500 MG PO CAPS   Oral   Take 1 capsule (500 mg total) by mouth 3 (three) times daily.   30 capsule   0   . CYCLOBENZAPRINE HCL 5 MG PO TABS   Oral   Take 1 tablet (5 mg total) by mouth 3 (three) times daily as needed for muscle spasms.   30 tablet   0   . FERROUS SULFATE 325 (65 FE) MG PO TABS   Oral   Take 1 tablet (325 mg total) by mouth daily with breakfast.   30 tablet   3   . FEXOFENADINE-PSEUDOEPHED ER 60-120 MG PO TB12   Oral   Take 1 tablet by mouth 2 (two) times daily as needed (allergies).   30 tablet   0   . FOLIC ACID 1 MG PO TABS   Oral   Take 1 tablet (1 mg total) by mouth daily.   30 tablet   3   . GLUCOSE BLOOD VI STRP      Use as instructed to test blood glucose  4-6 times per day (Pt is on insulin)   100 each   12   . IBUPROFEN 800 MG PO TABS   Oral   Take 1 tablet (800 mg total) by mouth every 8 (eight) hours as needed for pain.   30 tablet   1   . INSULIN ASPART 100 UNIT/ML [place] SOLN      Use as directed by MD   1 vial   3   . INSULIN GLARGINE 100 UNIT/ML [place] SOLN   Subcutaneous   Inject 45 Units into the skin at bedtime.   10 mL   12   . INSULIN PEN NEEDLE 31G X 5 MM MISC   Does not apply   1 Units by Does not apply route as directed.   100 each   3   . METFORMIN HCL 1000 MG PO TABS   Oral   Take 1 tablet (1,000 mg total) by mouth 2 (two) times daily with a meal.   60 tablet   3   . OXYCODONE-ACETAMINOPHEN 10-325 MG PO TABS   Oral   Take 1 tablet by mouth every 4 (four) hours as needed for pain.   40 tablet   0     BP 119/53  Pulse 86  Temp 98.2 F (36.8 C) (Oral)  Resp 16  SpO2 100%  LMP 05/09/2012  Physical Exam  Constitutional: She is oriented to person, place, and time. She appears well-developed and  well-nourished. No distress.  HENT:  Head: Normocephalic and atraumatic.  Eyes: Pupils are equal, round, and reactive to light.  Neck: Normal range of motion. Neck supple. No JVD present. No thyromegaly present.  Cardiovascular: Normal rate, regular rhythm and normal heart sounds.   No murmur heard. Pulmonary/Chest: Effort normal and breath sounds normal.  Abdominal: Soft. Bowel sounds are normal.  Musculoskeletal: Normal range of motion.       Trace pedal edema  Normal pedal pulses   Lymphadenopathy:    She has no cervical adenopathy.  Neurological: She is alert and oriented to person, place, and time. She has normal reflexes.  Skin: Skin is warm and dry.    ED Course  Procedures (including critical care time)  Labs Reviewed  GLUCOSE, CAPILLARY - Abnormal; Notable for the following:    Glucose-Capillary 193 (*)     All other components within normal limits  HEMOGLOBIN A1C - Abnormal; Notable for the following:    Hemoglobin A1C 7.3 (*)     Mean Plasma Glucose 163 (*)     All other components within normal limits  COMPREHENSIVE METABOLIC PANEL - Abnormal; Notable for the following:    Sodium 134 (*)     Glucose, Bld 216 (*)     Creatinine, Ser 0.45 (*)     Total Bilirubin 0.2 (*)     All other components within normal limits  CBC - Abnormal; Notable for the following:    Hemoglobin 8.8 (*)     HCT 28.3 (*)     MCV 56.9 (*)     MCH 17.7 (*)     RDW 19.3 (*)     All other components within normal limits  LIPID PANEL - Abnormal; Notable for the following:    Triglycerides 250 (*)     HDL 28 (*)     VLDL 50 (*)     All other components within normal limits  LAB REPORT - SCANNED   No results found.   1. DIABETES MELLITUS, TYPE II  2. ANEMIA-NOS   3. TOBACCO ABUSE   4. ALLERGIC RHINITIS   5. BOILS, RECURRENT   6. BACK PAIN, CHRONIC       MDM   The patient's chronic medications have been refilled.  Pt was given prescriptions for diabetes testing supplies.   She reports that she is testing her BS 8 times per day but did not bring meter or readings.  I have asked her to do so on return visit.  Pt reports that she takes antibiotics on occasion for chronic skin infections and boils for prophylaxis.  I explained to her that getting her diabetes under better control is extremely important in terms of reducing the skin infections.  Checked labs today - see orders.  Follow up with mental health, Pt says she is trying to get established with a pain management clinic.  Check CBC today.  Continue daily folic acid.  Offered flu vaccine today.  F/U in 1 month for comprehensive medical evaluation.  Return for new problems.  Pt verbalized understanding.   Cleora Fleet, MD, CDE, FAAFP [facility] [place], [place]          Cleora Fleet, [place] 05/13/12 [phone]

## 2012-05-14 ENCOUNTER — Telehealth (HOSPITAL_COMMUNITY): Payer: Self-pay | Admitting: Family Medicine

## 2012-05-14 NOTE — ED Notes (Signed)
SPOKE WITH PT CONCERNING NORMAL BLOOD WORK.TOLD PT A1C 7.3 AND TO F/U AS RECOMMENDED PER  DR.JOHNSON

## 2012-05-14 NOTE — Telephone Encounter (Signed)
Message copied by Darlis Loan on Wed May 14, 2012  7:37 PM ------      Message from: Cleora Fleet      Created: Wed May 14, 2012  6:48 PM       Labs came back stable.  Follow up as recommended            Rodney Langton, MD, CDE, FAAFP      [facility]      [facility]      [place], [place]

## 2012-05-19 ENCOUNTER — Emergency Department (INDEPENDENT_AMBULATORY_CARE_PROVIDER_SITE_OTHER)
Admission: EM | Admit: 2012-05-19 | Discharge: 2012-05-19 | Disposition: A | Discharge status: Home / Self Care | Payer: Medicare Other | Source: Home / Self Care

## 2012-05-19 ENCOUNTER — Encounter (HOSPITAL_COMMUNITY): Payer: Self-pay

## 2012-05-19 DIAGNOSIS — M549 Dorsalgia, unspecified: Secondary | ICD-10-CM

## 2012-05-19 DIAGNOSIS — E119 Type 2 diabetes mellitus without complications: Secondary | ICD-10-CM

## 2012-05-19 DIAGNOSIS — J309 Allergic rhinitis, unspecified: Secondary | ICD-10-CM

## 2012-05-19 DIAGNOSIS — E1149 Type 2 diabetes mellitus with other diabetic neurological complication: Secondary | ICD-10-CM

## 2012-05-19 DIAGNOSIS — K219 Gastro-esophageal reflux disease without esophagitis: Secondary | ICD-10-CM

## 2012-05-19 DIAGNOSIS — K5900 Constipation, unspecified: Secondary | ICD-10-CM

## 2012-05-19 MED ORDER — POLYETHYLENE GLYCOL 3350 17 GM/SCOOP PO POWD: 17.0000 g | Freq: Two times a day (BID) | ORAL | Status: DC

## 2012-05-19 MED ORDER — SIMETHICONE 80 MG PO CHEW: 80.0000 mg | CHEWABLE_TABLET | Freq: Four times a day (QID) | ORAL | Status: DC | PRN

## 2012-05-19 MED ORDER — GLUCOSE BLOOD VI STRP: ORAL_STRIP | Status: DC

## 2012-05-19 MED ORDER — OXYCODONE-ACETAMINOPHEN 10-325 MG PO TABS: 1.0000 | ORAL_TABLET | Freq: Three times a day (TID) | ORAL | Status: DC | PRN

## 2012-05-19 NOTE — ED Provider Notes (Signed)
History     CSN: [id number]  Arrival date & time 05/19/12  1011   Chief Complaint  Patient presents with  . Medication Refill    wants to speak with dr about medications  . Recurrent Skin Infections    under right  breast    HPI Pt presents to have stye evaluated on her right eye.  She says that she is out of her pain medications and needs some refills.  She says that she is scheduled to be seen by pain management clinic in next month.  Her BS are better controlled.    Past Medical History  Diagnosis Date  . Diabetes mellitus   . Anemia   . Sickle cell disease   . Headache   . Seasonal allergies   . Abnormal Pap smear   . Sickle cell anemia   . Gout     Past Surgical History  Procedure Date  . Cholecystectomy   . Cesarean section     Family History  Problem Relation Age of Onset  . Hypertension Mother   . Diabetes Mother   . Hypertension Maternal Aunt   . Diabetes Maternal Aunt   . Hypertension Maternal Uncle   . Diabetes Maternal Uncle   . Hypertension Maternal Grandmother     History  Substance Use Topics  . Smoking status: Current Every Day Smoker -- 0.5 packs/day    Types: Cigarettes  . Smokeless tobacco: Not on file  . Alcohol Use: No    OB History    Grav Para Term Preterm Abortions TAB SAB Ect Mult Living   8 8 7 1  0  0   7      Review of Systems  Constitutional: Negative.   HENT: Negative.   Eyes: Negative.   Respiratory: Negative.   Cardiovascular: Negative.   Gastrointestinal: Negative.   Musculoskeletal: Positive for back pain.  Neurological: Negative.   Hematological: Negative.   Psychiatric/Behavioral: Negative.     Allergies  Ciprofloxacin  Home Medications   Current Outpatient Rx  Name  Route  Sig  Dispense  Refill  . CEPHALEXIN 500 MG PO CAPS   Oral   Take 1 capsule (500 mg total) by mouth 3 (three) times daily.   30 capsule   0   . CYCLOBENZAPRINE HCL 5 MG PO TABS   Oral   Take 1 tablet (5 mg total) by mouth 3  (three) times daily as needed for muscle spasms.   30 tablet   0   . FERROUS SULFATE 325 (65 FE) MG PO TABS   Oral   Take 1 tablet (325 mg total) by mouth daily with breakfast.   30 tablet   3   . FEXOFENADINE-PSEUDOEPHED ER 60-120 MG PO TB12   Oral   Take 1 tablet by mouth 2 (two) times daily as needed (allergies).   30 tablet   0   . FOLIC ACID 1 MG PO TABS   Oral   Take 1 tablet (1 mg total) by mouth daily.   30 tablet   3   . GLUCOSE BLOOD VI STRP      Use as instructed to test blood glucose 4-6 times per day (Pt is on insulin)   100 each   12   . IBUPROFEN 800 MG PO TABS   Oral   Take 1 tablet (800 mg total) by mouth every 8 (eight) hours as needed for pain.   30 tablet   1   . INSULIN  ASPART 100 UNIT/ML [place] SOLN      Use as directed by MD   1 vial   3   . INSULIN GLARGINE 100 UNIT/ML [place] SOLN   Subcutaneous   Inject 45 Units into the skin at bedtime.   10 mL   12   . INSULIN PEN NEEDLE 31G X 5 MM MISC   Does not apply   1 Units by Does not apply route as directed.   100 each   3   . METFORMIN HCL 1000 MG PO TABS   Oral   Take 1 tablet (1,000 mg total) by mouth 2 (two) times daily with a meal.   60 tablet   3   . OXYCODONE-ACETAMINOPHEN 10-325 MG PO TABS   Oral   Take 1 tablet by mouth every 4 (four) hours as needed for pain.   40 tablet   0     BP 127/72  Pulse 92  Temp 98.8 F (37.1 C) (Oral)  Resp 21  SpO2 100%  LMP 05/09/2012  Physical Exam  Constitutional: She is oriented to person, place, and time. She appears well-developed and well-nourished. No distress.  HENT:  Head: Normocephalic and atraumatic.  Eyes: Conjunctivae normal and EOM are normal. Pupils are equal, round, and reactive to light.       Small healing stye right eye   Neck: Normal range of motion. Neck supple.  Cardiovascular: Normal rate, regular rhythm and normal heart sounds.   Pulmonary/Chest: Effort normal and breath sounds normal.  Abdominal: Soft.    Musculoskeletal: Normal range of motion.  Neurological: She is alert and oriented to person, place, and time.  Skin: Skin is warm and dry.  Psychiatric: She has a normal mood and affect. Her behavior is normal. Judgment and thought content normal.    ED Course  Procedures (including critical care time)  Labs Reviewed - No data to display No results found.   No diagnosis found.    MDM  IMPRESSION  Chronic pain  Narcotic pain medication dependence  Type 2 DM  Allergic rhinitis  RECOMMENDATIONS / PLAN  I refilled her percocet prescription today and explained that she will need to see her pain management specialist next month as scheduled. Continue current diabetes care plan.    FOLLOW UP As scheduled for regular health maintenance   The patient was given clear instructions to go to ER or return to medical center if symptoms don't improve, worsen or new problems develop.  The patient verbalized understanding.  The patient was told to call to get lab results if they haven't heard anything in the next week.            Cleora Fleet, MD 05/19/12 2046

## 2012-05-19 NOTE — ED Notes (Signed)
Complain of boil under right breast and would like to speak with dr. About prescribed medications

## 2012-06-02 ENCOUNTER — Encounter (HOSPITAL_COMMUNITY): Payer: Self-pay

## 2012-06-02 ENCOUNTER — Emergency Department (INDEPENDENT_AMBULATORY_CARE_PROVIDER_SITE_OTHER)
Admission: EM | Admit: 2012-06-02 | Discharge: 2012-06-02 | Disposition: A | Discharge status: Home / Self Care | Payer: Medicare Other | Source: Home / Self Care

## 2012-06-02 DIAGNOSIS — B373 Candidiasis of vulva and vagina: Secondary | ICD-10-CM

## 2012-06-02 DIAGNOSIS — E119 Type 2 diabetes mellitus without complications: Secondary | ICD-10-CM

## 2012-06-02 DIAGNOSIS — F17200 Nicotine dependence, unspecified, uncomplicated: Secondary | ICD-10-CM

## 2012-06-02 DIAGNOSIS — K219 Gastro-esophageal reflux disease without esophagitis: Secondary | ICD-10-CM

## 2012-06-02 DIAGNOSIS — B3731 Acute candidiasis of vulva and vagina: Secondary | ICD-10-CM

## 2012-06-02 DIAGNOSIS — D649 Anemia, unspecified: Secondary | ICD-10-CM

## 2012-06-02 MED ORDER — FLUCONAZOLE 100 MG PO TABS: 100.0000 mg | ORAL_TABLET | Freq: Every day | ORAL | Status: DC

## 2012-06-02 NOTE — ED Notes (Signed)
Follow up for diabetes
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
## 2012-06-16 ENCOUNTER — Encounter (HOSPITAL_COMMUNITY): Payer: Self-pay

## 2012-06-16 ENCOUNTER — Emergency Department (INDEPENDENT_AMBULATORY_CARE_PROVIDER_SITE_OTHER)
Admission: EM | Admit: 2012-06-16 | Discharge: 2012-06-16 | Disposition: A | Discharge status: Home / Self Care | Payer: Medicare Other | Source: Home / Self Care

## 2012-06-16 DIAGNOSIS — F1120 Opioid dependence, uncomplicated: Secondary | ICD-10-CM

## 2012-06-16 DIAGNOSIS — M549 Dorsalgia, unspecified: Secondary | ICD-10-CM

## 2012-06-16 DIAGNOSIS — D649 Anemia, unspecified: Secondary | ICD-10-CM

## 2012-06-16 DIAGNOSIS — F17200 Nicotine dependence, unspecified, uncomplicated: Secondary | ICD-10-CM

## 2012-06-16 DIAGNOSIS — E119 Type 2 diabetes mellitus without complications: Secondary | ICD-10-CM

## 2012-06-16 DIAGNOSIS — F1920 Other psychoactive substance dependence, uncomplicated: Secondary | ICD-10-CM

## 2012-06-16 DIAGNOSIS — K219 Gastro-esophageal reflux disease without esophagitis: Secondary | ICD-10-CM

## 2012-06-16 MED ORDER — INSULIN PEN NEEDLE 31G X 5 MM MISC: 1.0000 [IU] | Status: DC

## 2012-06-16 MED ORDER — OXYCODONE-ACETAMINOPHEN 10-325 MG PO TABS: 1.0000 | ORAL_TABLET | Freq: Three times a day (TID) | ORAL | Status: DC | PRN

## 2012-06-16 MED ORDER — FLUCONAZOLE 100 MG PO TABS: 100.0000 mg | ORAL_TABLET | Freq: Every day | ORAL | Status: DC

## 2012-06-16 MED ORDER — FREESTYLE LANCETS MISC: Status: DC

## 2012-06-16 NOTE — ED Notes (Signed)
Follow up- history of DM 

## 2012-06-16 NOTE — ED Provider Notes (Signed)
Patient Demographics  [name], is a 36 y.o. female  XBJ:[id number]  HYQ:[id number]  DOB - Apr 30, 1976  Chief Complaint  Patient presents with  . Follow-up        Subjective:   [name] today is here for a follow up visit. She claims she is off of her narcotics and is requesting a refill. I reviewed Dr. Henriette Combs note from last visit, we apparently has still not received any prior documentation from a prior physicians, she also is unable to get her prior records from have served as well. She claims to me that she previously used to see a pain in the in [facility] a number of years ago, and has just recently reestablished practice with this physician, her first appointment is on January 24. She claims that once she establishes practice for this physician, she will get her narcotics from that physician, and we will only be managing her diabetes here. Patient has No headache, No chest pain, No abdominal pain - No Nausea, No new weakness tingling or numbness, No Cough - SOB. She continues to have chronic back pain that is unchanged. She thinks she has a yeast infection and is requesting Diflucan.  Objective:    Filed Vitals:   06/16/12 1659  BP: 144/68  Pulse: 95  Temp: 98.7 F (37.1 C)  TempSrc: Oral  Resp: 20  SpO2: 100%     ALLERGIES:   Allergies  Allergen Reactions  . Ciprofloxacin     REACTION: Rash    PAST MEDICAL HISTORY: Past Medical History  Diagnosis Date  . Diabetes mellitus   . Anemia   . Sickle cell disease   . Headache   . Seasonal allergies   . Abnormal Pap smear   . Sickle cell anemia   . Gout     PAST SURGICAL HISTORY: Past Surgical History  Procedure Date  . Cholecystectomy   . Cesarean section     FAMILY HISTORY: Family History  Problem Relation Age of Onset  . Hypertension Mother   . Diabetes Mother   . Hypertension Maternal Aunt   . Diabetes Maternal Aunt   . Hypertension Maternal Uncle   . Diabetes Maternal Uncle   .  Hypertension Maternal Grandmother     MEDICATIONS AT HOME: Prior to Admission medications   Medication Sig Start Date End Date Taking? Authorizing Provider  cyclobenzaprine (FLEXERIL) 5 MG tablet Take 1 tablet (5 mg total) by mouth 3 (three) times daily as needed for muscle spasms. 05/12/12   Clanford Cyndie Mull, MD  ferrous sulfate 325 (65 FE) MG tablet Take 1 tablet (325 mg total) by mouth daily with breakfast. 05/12/12   Clanford Cyndie Mull, MD  fexofenadine-pseudoephedrine (ALLEGRA-D) 60-120 MG per tablet Take 1 tablet by mouth 2 (two) times daily as needed (allergies). 05/12/12   Clanford Cyndie Mull, MD  fluconazole (DIFLUCAN) 100 MG tablet Take 1 tablet (100 mg total) by mouth daily. 06/16/12   Shanker Levora Dredge, MD  folic acid (FOLVITE) 1 MG tablet Take 1 tablet (1 mg total) by mouth daily. 05/12/12   Clanford Cyndie Mull, MD  glucose blood (ACCU-CHEK SMARTVIEW) test strip Use as instructed to test blood sugar 8 times per day:  Diagnosis 250.02 pt is on insulin. 05/19/12   Clanford Cyndie Mull, MD  glucose blood test strip Use as instructed to test blood glucose 4-6 times per day (Pt is on insulin) 05/12/12   Clanford Cyndie Mull, MD  ibuprofen (ADVIL,MOTRIN) 800 MG tablet Take 1 tablet (  800 mg total) by mouth every 8 (eight) hours as needed for pain. 05/12/12   Clanford Cyndie Mull, MD  insulin aspart (NOVOLOG FLEXPEN) 100 UNIT/ML injection Use as directed by MD 05/12/12   Clanford Cyndie Mull, MD  insulin glargine (LANTUS SOLOSTAR) 100 UNIT/ML injection Inject 45 Units into the skin at bedtime. 05/12/12   Clanford Cyndie Mull, MD  Insulin Pen Needle 31G X 5 MM MISC 1 Units by Does not apply route as directed. 06/16/12   Shanker Levora Dredge, MD  Lancets (FREESTYLE) lancets Use as instructed 06/16/12   Maretta Bees, MD  metFORMIN (GLUCOPHAGE) 1000 MG tablet Take 1 tablet (1,000 mg total) by mouth 2 (two) times daily with a meal. 05/12/12   Clanford Cyndie Mull, MD  oxyCODONE-acetaminophen (PERCOCET)  10-325 MG per tablet Take 1 tablet by mouth every 8 (eight) hours as needed for pain. 06/16/12   Shanker Levora Dredge, MD  polyethylene glycol powder (GLYCOLAX/MIRALAX) powder Take 17 g by mouth 2 (two) times daily. 05/19/12   Clanford Cyndie Mull, MD  simethicone (GAS-X) 80 MG chewable tablet Chew 1 tablet (80 mg total) by mouth every 6 (six) hours as needed for flatulence. 05/19/12   Clanford Cyndie Mull, MD    REVIEW OF SYSTEMS:  Constitutional:   No   Fevers, chills, fatigue.  HEENT:    No headaches, Sore throat,   Cardio-vascular: No chest pain,  Orthopnea, swelling in lower extremities, anasarca, palpitations  GI:  No abdominal pain, nausea, vomiting, diarrhea  Resp: No shortness of breath,  No coughing up of blood.No cough.No wheezing.  Skin:  no rash or lesions.  GU:  no dysuria, change in color of urine, no urgency or frequency.  No flank pain.  Musculoskeletal: No joint pain or swelling.  No decreased range of motion.    Psych: No change in mood or affect. No depression or anxiety.  No memory loss.   Exam  General appearance :Awake, alert, not in any distress. Speech Clear. Not toxic Looking HEENT: Atraumatic and Normocephalic, pupils equally reactive to light and accomodation Neck: supple, no JVD. No cervical lymphadenopathy.  Chest:Good air entry bilaterally, no added sounds  CVS: S1 S2 regular, no murmurs.  Abdomen: Bowel sounds present, Non tender and not distended with no gaurding, rigidity or rebound. Extremities: B/L Lower Ext shows no edema, both legs are warm to touch Neurology: Awake alert, and oriented X 3, CN II-XII intact, Non focal Skin:No Rash Wounds:N/A    Data Review   CBC No results found for this basename: WBC:5,HGB:5,HCT:5,PLT:5,MCV:5,MCH:5,MCHC:5,RDW:5,NEUTRABS:5,LYMPHSABS:5,MONOABS:5,EOSABS:5,BASOSABS:5,BANDABS:5,BANDSABD:5 in the last 168 hours  Chemistries   No results found for this basename:  NA:5,K:5,CL:5,CO2:5,GLUCOSE:5,BUN:5,CREATININE:5,GFRCGP,:5,CALCIUM:5,MG:5,AST:5,ALT:5,ALKPHOS:5,BILITOT:5 in the last 168 hours ------------------------------------------------------------------------------------------------------------------ No results found for this basename: HGBA1C:2 in the last 72 hours ------------------------------------------------------------------------------------------------------------------ No results found for this basename: CHOL:2,HDL:2,LDLCALC:2,TRIG:2,CHOLHDL:2,LDLDIRECT:2 in the last 72 hours ------------------------------------------------------------------------------------------------------------------ No results found for this basename: TSH,T4TOTAL,FREET3,T3FREE,THYROIDAB in the last 72 hours ------------------------------------------------------------------------------------------------------------------ No results found for this basename: VITAMINB12:2,FOLATE:2,FERRITIN:2,TIBC:2,IRON:2,RETICCTPCT:2 in the last 72 hours  Coagulation profile  No results found for this basename: INR:5,PROTIME:5 in the last 168 hours    Assessment & Plan   Diabetes - Continue with Lantus, NovoLog and metformin - Last hemoglobin A1c in November of 2013 was 7.3  Chronic pain syndrome - Please see above documentation and prior documentation from Dr. Laural Benes - She claims to me, today that she would see a pain management M.D. in [facility] on January 24. When asked the name of the clinic-she can only tell me that  it is on [address] She tells me today that, after January 24 we can only manage her diabetes and she will get her narcotics from this pain M.D. I have given her 30 tablets. I've also requested that she obtain her prior records from her prior physicians.  Anemia - Her hemoglobin in November was 8.8, we will need to recheck a hemoglobin at her next visit. She is currently asymptomatic with this. He is on iron and folate supplementation.  Rest of chronic medical  problems are stable.  We will see her back in 2 weeks.    Follow-up Information    Follow up with [facility]. Schedule an appointment as soon as possible for a visit in 2 weeks.          Maretta Bees, MD 06/16/12 [phone]

## 2012-07-04 ENCOUNTER — Ambulatory Visit (INDEPENDENT_AMBULATORY_CARE_PROVIDER_SITE_OTHER): Payer: Medicare Other | Admitting: Family Medicine

## 2012-07-04 VITALS — BP 133/82 | HR 78 | Temp 98.2°F | Resp 16 | Ht 66.0 in | Wt 196.0 lb

## 2012-07-04 DIAGNOSIS — F1110 Opioid abuse, uncomplicated: Secondary | ICD-10-CM | POA: Insufficient documentation

## 2012-07-04 DIAGNOSIS — N898 Other specified noninflammatory disorders of vagina: Secondary | ICD-10-CM

## 2012-07-04 DIAGNOSIS — D571 Sickle-cell disease without crisis: Secondary | ICD-10-CM

## 2012-07-04 DIAGNOSIS — F1310 Sedative, hypnotic or anxiolytic abuse, uncomplicated: Secondary | ICD-10-CM

## 2012-07-04 DIAGNOSIS — R350 Frequency of micturition: Secondary | ICD-10-CM

## 2012-07-04 DIAGNOSIS — E119 Type 2 diabetes mellitus without complications: Secondary | ICD-10-CM

## 2012-07-04 DIAGNOSIS — F5089 Other specified eating disorder: Secondary | ICD-10-CM

## 2012-07-04 DIAGNOSIS — N949 Unspecified condition associated with female genital organs and menstrual cycle: Secondary | ICD-10-CM

## 2012-07-04 LAB — POCT UA - MICROSCOPIC ONLY
Casts, Ur, LPF, POC: NEGATIVE
Crystals, Ur, HPF, POC: NEGATIVE
Yeast, UA: NEGATIVE

## 2012-07-04 LAB — POCT WET PREP WITH KOH
KOH Prep POC: NEGATIVE
RBC Wet Prep HPF POC: NEGATIVE
WBC Wet Prep HPF POC: 20.3
Yeast Wet Prep HPF POC: NEGATIVE

## 2012-07-04 LAB — POCT CBC
MCH, POC: 17.4 pg — AB (ref 27–31.2)
MCV: 59.2 fL — AB (ref 80–97)
MID (cbc): 0.7 (ref 0–0.9)
POC LYMPH PERCENT: 25.7 %L (ref 10–50)
POC MID %: 8.1 %M (ref 0–12)
Platelet Count, POC: 420 10*3/uL (ref 142–424)
RDW, POC: 22.6 %
WBC: 9.1 10*3/uL (ref 4.6–10.2)

## 2012-07-04 LAB — POCT URINALYSIS DIPSTICK
Bilirubin, UA: NEGATIVE
Glucose, UA: NEGATIVE
Ketones, UA: NEGATIVE
Nitrite, UA: NEGATIVE
Protein, UA: 30
Spec Grav, UA: 1.025
Urobilinogen, UA: 0.2
pH, UA: 6

## 2012-07-04 MED ORDER — CEPHALEXIN 500 MG PO CAPS: 500.0000 mg | ORAL_CAPSULE | Freq: Two times a day (BID) | ORAL | Status: DC

## 2012-07-04 MED ORDER — METRONIDAZOLE 0.75 % VA GEL: VAGINAL | Status: DC

## 2012-07-04 MED ORDER — INSULIN ASPART 100 UNIT/ML ~~LOC~~ SOLN: SUBCUTANEOUS | Status: DC

## 2012-07-04 MED ORDER — INSULIN GLARGINE 100 UNIT/ML ~~LOC~~ SOLN: 70.0000 [IU] | Freq: Every day | SUBCUTANEOUS | Status: DC

## 2012-07-04 MED ORDER — GLUCOSE BLOOD VI STRP: ORAL_STRIP | Status: DC

## 2012-07-04 NOTE — Progress Notes (Signed)
[facility] [address], [place] [address] [phone]  Date:  07/04/2012   Name:  [name]   DOB:  [birth year]   MRN:  [id number]  PCP:  No primary provider on file.    Chief Complaint: Medication Refill   History of Present Illness:  [name] is a 37 y.o. very pleasant female patient who presents with the following:  Reviewed controlled substance data base- she received #90 percocet 10 on 06/27/12 and #60 on 06/23/12.  She asked me for a refill.  DO NOT GIVE NARCOTICS FROM [facility]  She reports she has a history of sickle cell disease.   She notes that her " body is starting to hurt" so she may need more pain medication  She also states that she has medicaid and medicare, and that she wants to find a new PCP.  She has IDDM and checks her glucose 8x a day.    She also has noted a vaginal discharge for about 2 days.  She notes an odor.    Her LMP was last week.  She still notes some pink on the TP when she wipes herself now.    She also reports that she eats [employer]- she has done this for years.  In fact, her mother and children also eat this starch.   She brought a box of it with her today.  States she has not been told that she had a vitamin deficiency, etc in the past.    She used to go to [facility] for her SSD.  However, she needs a referral to get established with a hematologist here in [place].    She also has noted folliculitis for the last several months. She has this problem mostly on her face, genital area, underarms and breasts.    Patient Active Problem List  Diagnosis  . DIABETES MELLITUS, TYPE II  . DIABETIC PERIPHERAL NEUROPATHY  . ANEMIA-NOS  . ANXIETY  . TOBACCO ABUSE  . ALLERGIC RHINITIS  . GERD  . BOILS, RECURRENT  . BACK PAIN, CHRONIC    Past Medical History  Diagnosis Date  . Diabetes mellitus   . Anemia   . Sickle cell disease   . Headache   . Seasonal allergies   . Abnormal Pap smear   .  Sickle cell anemia   . Gout     Past Surgical History  Procedure Date  . Cholecystectomy   . Cesarean section     History  Substance Use Topics  . Smoking status: Current Every Day Smoker -- 0.5 packs/day    Types: Cigarettes  . Smokeless tobacco: Not on file  . Alcohol Use: No    Family History  Problem Relation Age of Onset  . Hypertension Mother   . Diabetes Mother   . Hypertension Maternal Aunt   . Diabetes Maternal Aunt   . Hypertension Maternal Uncle   . Diabetes Maternal Uncle   . Hypertension Maternal Grandmother     Allergies  Allergen Reactions  . Ciprofloxacin     REACTION: Rash    Medication list has been reviewed and updated.  Current Outpatient Prescriptions on File Prior to Visit  Medication Sig Dispense Refill  . cyclobenzaprine (FLEXERIL) 5 MG tablet Take 1 tablet (5 mg total) by mouth 3 (three) times daily as needed for muscle spasms.  30 tablet  0  . ferrous sulfate 325 (65 FE) MG tablet Take 1 tablet (325  mg total) by mouth daily with breakfast.  30 tablet  3  . folic acid (FOLVITE) 1 MG tablet Take 1 tablet (1 mg total) by mouth daily.  30 tablet  3  . glucose blood (ACCU-CHEK SMARTVIEW) test strip Use as instructed to test blood sugar 8 times per day:  Diagnosis 250.02 pt is on insulin.  200 each  5  . glucose blood test strip Use as instructed to test blood glucose 4-6 times per day (Pt is on insulin)  100 each  12  . ibuprofen (ADVIL,MOTRIN) 800 MG tablet Take 1 tablet (800 mg total) by mouth every 8 (eight) hours as needed for pain.  30 tablet  1  . insulin aspart (NOVOLOG FLEXPEN) 100 UNIT/ML injection Use as directed by MD  1 vial  3  . insulin glargine (LANTUS SOLOSTAR) 100 UNIT/ML injection Inject 45 Units into the skin at bedtime.  10 mL  12  . Insulin Pen Needle 31G X 5 MM MISC 1 Units by Does not apply route as directed.  100 each  3  . Lancets (FREESTYLE) lancets Use as instructed  100 each  12  . metFORMIN (GLUCOPHAGE) 1000 MG tablet  Take 1 tablet (1,000 mg total) by mouth 2 (two) times daily with a meal.  60 tablet  3  . oxyCODONE-acetaminophen (PERCOCET) 10-325 MG per tablet Take 1 tablet by mouth every 8 (eight) hours as needed for pain.  30 tablet  0  . polyethylene glycol powder (GLYCOLAX/MIRALAX) powder Take 17 g by mouth 2 (two) times daily.  527 g  4  . simethicone (GAS-X) 80 MG chewable tablet Chew 1 tablet (80 mg total) by mouth every 6 (six) hours as needed for flatulence.  30 tablet  3  . fexofenadine-pseudoephedrine (ALLEGRA-D) 60-120 MG per tablet Take 1 tablet by mouth 2 (two) times daily as needed (allergies).  30 tablet  0  . fluconazole (DIFLUCAN) 100 MG tablet Take 1 tablet (100 mg total) by mouth daily.  3 tablet  0  . [DISCONTINUED] insulin regular (NOVOLIN R,HUMULIN R) 100 units/mL injection Inject 70 Units into the skin 2 (two) times daily before a meal.        Review of Systems:  As per HPI- otherwise negative.   Physical Examination: Filed Vitals:   07/04/12 1456  BP: 133/82  Pulse: 78  Temp: 98.2 F (36.8 C)  Resp: 16   Filed Vitals:   07/04/12 1456  Height: 5\' 6"  (1.676 m)  Weight: 196 lb (88.905 kg)   Body mass index is 31.64 kg/(m^2). Ideal Body Weight: Weight in (lb) to have BMI = 25: 154.6   GEN: WDWN, NAD, Non-toxic, A & O x 3 HEENT: Atraumatic, Normocephalic. Neck supple. No masses, No LAD. Bilateral TM wnl, oropharynx normal.  PEERL,EOMI.   Ears and Nose: No external deformity. CV: RRR, No M/G/R. No JVD. No thrill. No extra heart sounds. PULM: CTA B, no wheezes, crackles, rhonchi. No retractions. No resp. distress. No accessory muscle use. ABD: S, NT, ND, +BS. No rebound. No HSM. EXTR: No c/c/e NEURO Normal gait.  PSYCH: Normally interactive. Conversant. Not depressed or anxious appearing.  Calm demeanor.  GU: no unusual odor or discharge.  No adnexal tenderness, no CMT  Results for orders placed in visit on 07/04/12  POCT UA - MICROSCOPIC ONLY      Component Value  Range   WBC, Ur, HPF, POC 1-4     RBC, urine, microscopic 10-15     Bacteria,  U Microscopic 2+     Mucus, UA moderate     Epithelial cells, urine per micros 6-8     Crystals, Ur, HPF, POC neg     Casts, Ur, LPF, POC neg     Yeast, UA neg     Amorphous moderate    POCT URINALYSIS DIPSTICK      Component Value Range   Color, UA yellow     Clarity, UA hazy     Glucose, UA neg     Bilirubin, UA neg     Ketones, UA neg     Spec Grav, UA 1.025     Blood, UA small     pH, UA 6.0     Protein, UA 30     Urobilinogen, UA 0.2     Nitrite, UA neg     Leukocytes, UA Trace    POCT CBC      Component Value Range   WBC 9.1  4.6 - 10.2 K/uL   Lymph, poc 2.3  0.6 - 3.4   POC LYMPH PERCENT 25.7  10 - 50 %L   MID (cbc) 0.7  0 - 0.9   POC MID % 8.1  0 - 12 %M   POC Granulocyte 6.0  2 - 6.9   Granulocyte percent 66.2  37 - 80 %G   RBC 5.52 (*) 4.04 - 5.48 M/uL   Hemoglobin 9.6 (*) 12.2 - 16.2 g/dL   HCT, POC 56.2 (*) 13.0 - 47.9 %   MCV 59.2 (*) 80 - 97 fL   MCH, POC 17.4 (*) 27 - 31.2 pg   MCHC 29.4 (*) 31.8 - 35.4 g/dL   RDW, POC 86.5     Platelet Count, POC 420  142 - 424 K/uL   MPV    0 - 99.8 fL  GLUCOSE, POCT (MANUAL RESULT ENTRY)      Component Value Range   POC Glucose 164 (*) 70 - 99 mg/dl  POCT URINE PREGNANCY      Component Value Range   Preg Test, Ur Negative    POCT WET PREP WITH KOH      Component Value Range   Trichomonas, UA Negative     Clue Cells Wet Prep HPF POC 100%     Epithelial Wet Prep HPF POC 5-15     Yeast Wet Prep HPF POC neg     Bacteria Wet Prep HPF POC 4+     RBC Wet Prep HPF POC neg     WBC Wet Prep HPF POC 20.30     KOH Prep POC Negative       Assessment and Plan: 1. Vaginal discharge  POCT urine pregnancy, POCT Wet Prep with KOH, metroNIDAZOLE (METROGEL VAGINAL) 0.75 % vaginal gel  2. Urinary frequency  POCT UA - Microscopic Only, POCT urinalysis dipstick, cephALEXin (KEFLEX) 500 MG capsule  3. Pica  Ferritin  4. Diabetes mellitus, type 2   POCT glucose (manual entry), insulin glargine (LANTUS SOLOSTAR) 100 UNIT/ML injection, insulin aspart (NOVOLOG FLEXPEN) 100 UNIT/ML injection, glucose blood (ACCU-CHEK AVIVA) test strip  5. Sickle cell anemia  POCT CBC, Ambulatory referral to Hematology  6. Vaginal odor    7. Narcotic abuse     Refilled her insulin per her request, as well as her test strips. Refilled according to her stated regimen- lantus and also SSI with meals.  An A1c from November was 7.3%.   Will treat for BV with metrogel, and for a UTI with keflex.  Await urine culture Meds ordered this encounter  Medications  . insulin glargine (LANTUS SOLOSTAR) 100 UNIT/ML injection    Sig: Inject 70 Units into the skin at bedtime.    Dispense:  15 mL    Refill:  3  . insulin aspart (NOVOLOG FLEXPEN) 100 UNIT/ML injection    Sig: Use as per your sliding scale    Dispense:  15 mL    Refill:  3  . glucose blood (ACCU-CHEK AVIVA) test strip    Sig: Use as instructed    Dispense:  200 each    Refill:  12    Pt states she checks her glucose 8x a day  . cephALEXin (KEFLEX) 500 MG capsule    Sig: Take 1 capsule (500 mg total) by mouth 2 (two) times daily.    Dispense:  14 capsule    Refill:  0  . metroNIDAZOLE (METROGEL VAGINAL) 0.75 % vaginal gel    Sig: Use one suppository vaginally at bedtime for 5 days    Dispense:  70 g    Refill:  0   Declined to refill her narcotic medication.  From review of her database report it appears that she is using several different doctors and many different pharmacies.  Let her know that we will not be able to prescribe narcotics for her- according to her last [facility] note she had a pain management appt pending for later this month.    Abbe Amsterdam, MD

## 2012-07-04 NOTE — Patient Instructions (Addendum)
Use the flagyl for your UTI and metrogel for your vaginal infection.  I will be in touch when I receive the results of your iron test.

## 2012-07-06 LAB — URINE CULTURE: Colony Count: 9000

## 2012-07-08 ENCOUNTER — Encounter: Payer: Self-pay | Admitting: Family Medicine

## 2012-07-16 ENCOUNTER — Emergency Department (HOSPITAL_COMMUNITY): Payer: Medicare Other

## 2012-07-16 ENCOUNTER — Encounter (HOSPITAL_COMMUNITY): Payer: Self-pay | Admitting: Emergency Medicine

## 2012-07-16 ENCOUNTER — Emergency Department (HOSPITAL_COMMUNITY)
Admission: EM | Admit: 2012-07-16 | Discharge: 2012-07-16 | Disposition: A | Discharge status: Home / Self Care | Payer: Medicare Other | Attending: Emergency Medicine | Admitting: Emergency Medicine

## 2012-07-16 DIAGNOSIS — F17200 Nicotine dependence, unspecified, uncomplicated: Secondary | ICD-10-CM | POA: Insufficient documentation

## 2012-07-16 DIAGNOSIS — E119 Type 2 diabetes mellitus without complications: Secondary | ICD-10-CM | POA: Insufficient documentation

## 2012-07-16 DIAGNOSIS — Z8639 Personal history of other endocrine, nutritional and metabolic disease: Secondary | ICD-10-CM | POA: Insufficient documentation

## 2012-07-16 DIAGNOSIS — Z862 Personal history of diseases of the blood and blood-forming organs and certain disorders involving the immune mechanism: Secondary | ICD-10-CM | POA: Insufficient documentation

## 2012-07-16 DIAGNOSIS — G8929 Other chronic pain: Secondary | ICD-10-CM | POA: Insufficient documentation

## 2012-07-16 DIAGNOSIS — Z79899 Other long term (current) drug therapy: Secondary | ICD-10-CM | POA: Insufficient documentation

## 2012-07-16 DIAGNOSIS — R112 Nausea with vomiting, unspecified: Secondary | ICD-10-CM | POA: Insufficient documentation

## 2012-07-16 DIAGNOSIS — Z794 Long term (current) use of insulin: Secondary | ICD-10-CM | POA: Insufficient documentation

## 2012-07-16 LAB — COMPREHENSIVE METABOLIC PANEL
Alkaline Phosphatase: 58 U/L (ref 39–117)
BUN: 9 mg/dL (ref 6–23)
Creatinine, Ser: 0.48 mg/dL — ABNORMAL LOW (ref 0.50–1.10)
GFR calc Af Amer: >90 mL/min (ref >90)
Glucose, Bld: 184 mg/dL — ABNORMAL HIGH (ref 70–99)
Potassium: 4.3 mEq/L (ref 3.5–5.1)
Total Bilirubin: 0.2 mg/dL — ABNORMAL LOW (ref 0.3–1.2)
Total Protein: 7.7 g/dL (ref 6.0–8.3)

## 2012-07-16 LAB — CBC
Hemoglobin: 10.5 g/dL — ABNORMAL LOW (ref 12.0–15.0)
MCH: 18 pg — ABNORMAL LOW (ref 26.0–34.0)
MCHC: 31.2 g/dL (ref 30.0–36.0)
RDW: 20.8 % — ABNORMAL HIGH (ref 11.5–15.5)

## 2012-07-16 LAB — RETICULOCYTES: Retic Ct Pct: 1.8 % (ref 0.4–3.1)

## 2012-07-16 MED ORDER — HYDROMORPHONE HCL PF 1 MG/ML IJ SOLN
1.0000 mg | Freq: Once | INTRAMUSCULAR | Status: AC
  Administered 2012-07-16: 1 mg via INTRAVENOUS
  Filled 2012-07-16: qty 1

## 2012-07-16 MED ORDER — MORPHINE SULFATE 4 MG/ML IJ SOLN
4.0000 mg | Freq: Once | INTRAMUSCULAR | Status: DC
  Filled 2012-07-16: qty 1

## 2012-07-16 MED ORDER — KETOROLAC TROMETHAMINE 30 MG/ML IJ SOLN
30.0000 mg | Freq: Once | INTRAMUSCULAR | Status: AC
  Administered 2012-07-16: 30 mg via INTRAVENOUS
  Filled 2012-07-16: qty 1

## 2012-07-16 MED ORDER — ONDANSETRON HCL 4 MG/2ML IJ SOLN
4.0000 mg | Freq: Once | INTRAMUSCULAR | Status: AC
  Administered 2012-07-16: 4 mg via INTRAVENOUS
  Filled 2012-07-16: qty 2

## 2012-07-16 MED ORDER — SODIUM CHLORIDE 0.9 % IV BOLUS (SEPSIS)
1000.0000 mL | Freq: Once | INTRAVENOUS | Status: AC
  Administered 2012-07-16: 1000 mL via INTRAVENOUS

## 2012-07-16 NOTE — ED Notes (Signed)
Per ems. Pt reports sickle cell pain all over starting last night. Took prescribed percocet with no relief. Pain is 10/10. Pt also experiencing nv due to pain.

## 2012-07-16 NOTE — ED Provider Notes (Signed)
History     CSN: [id number]  Arrival date & time 07/16/12  0459   First MD Initiated Contact with Patient 07/16/12 0459      Chief Complaint  Patient presents with  . Sickle Cell Pain Crisis    (Consider location/radiation/quality/duration/timing/severity/associated sxs/prior treatment) Patient is a 37 y.o. female presenting with sickle cell pain. The history is provided by the patient.  Sickle Cell Pain Crisis  This is a chronic problem. The current episode started yesterday. The onset was gradual. The problem occurs continuously. The problem has been unchanged. The pain location is generalized. Site of pain is localized in bone and muscle. The pain is similar to prior episodes. The pain is moderate. Nothing relieves the symptoms. The symptoms are not relieved by one or more prescription drugs. The symptoms are aggravated by movement. Associated symptoms include nausea and vomiting. Pertinent negatives include no chest pain, no cough and no difficulty breathing. There is no swelling present.    Past Medical History  Diagnosis Date  . Diabetes mellitus   . Anemia   . Sickle cell disease   . Headache   . Seasonal allergies   . Abnormal Pap smear   . Sickle cell anemia   . Gout     Past Surgical History  Procedure Date  . Cholecystectomy   . Cesarean section     Family History  Problem Relation Age of Onset  . Hypertension Mother   . Diabetes Mother   . Hypertension Maternal Aunt   . Diabetes Maternal Aunt   . Hypertension Maternal Uncle   . Diabetes Maternal Uncle   . Hypertension Maternal Grandmother     History  Substance Use Topics  . Smoking status: Current Every Day Smoker -- 0.5 packs/day    Types: Cigarettes  . Smokeless tobacco: Not on file  . Alcohol Use: No    OB History    Grav Para Term Preterm Abortions TAB SAB Ect Mult Living   8 8 7 1  0  0   7      Review of Systems  Respiratory: Negative for cough.   Cardiovascular: Negative for chest  pain.  Gastrointestinal: Positive for nausea and vomiting.  All other systems reviewed and are negative.    Allergies  Ciprofloxacin  Home Medications   Current Outpatient Rx  Name  Route  Sig  Dispense  Refill  . CEPHALEXIN 500 MG PO CAPS   Oral   Take 1 capsule (500 mg total) by mouth 2 (two) times daily.   14 capsule   0   . CYCLOBENZAPRINE HCL 5 MG PO TABS   Oral   Take 1 tablet (5 mg total) by mouth 3 (three) times daily as needed for muscle spasms.   30 tablet   0   . FERROUS SULFATE 325 (65 FE) MG PO TABS   Oral   Take 1 tablet (325 mg total) by mouth daily with breakfast.   30 tablet   3   . FEXOFENADINE-PSEUDOEPHED ER 60-120 MG PO TB12   Oral   Take 1 tablet by mouth 2 (two) times daily as needed (allergies).   30 tablet   0   . FLUCONAZOLE 100 MG PO TABS   Oral   Take 1 tablet (100 mg total) by mouth daily.   3 tablet   0   . FOLIC ACID 1 MG PO TABS   Oral   Take 1 tablet (1 mg total) by mouth daily.   30  tablet   3   . GLUCOSE BLOOD VI STRP      Use as instructed   200 each   12     Pt states she checks her glucose 8x a day   . GLUCOSE BLOOD VI STRP      Use as instructed to test blood sugar 8 times per day:  Diagnosis 250.02 pt is on insulin.   200 each   5   . GLUCOSE BLOOD VI STRP      Use as instructed to test blood glucose 4-6 times per day (Pt is on insulin)   100 each   12   . IBUPROFEN 800 MG PO TABS   Oral   Take 1 tablet (800 mg total) by mouth every 8 (eight) hours as needed for pain.   30 tablet   1   . INSULIN ASPART 100 UNIT/ML [place] SOLN      Use as per your sliding scale   15 mL   3   . INSULIN GLARGINE 100 UNIT/ML [place] SOLN   Subcutaneous   Inject 70 Units into the skin at bedtime.   15 mL   3   . INSULIN PEN NEEDLE 31G X 5 MM MISC   Does not apply   1 Units by Does not apply route as directed.   100 each   3   . FREESTYLE LANCETS MISC      Use as instructed   100 each   12   . METFORMIN HCL 1000  MG PO TABS   Oral   Take 1 tablet (1,000 mg total) by mouth 2 (two) times daily with a meal.   60 tablet   3   . METRONIDAZOLE 0.75 % VA GEL      Use one suppository vaginally at bedtime for 5 days   70 g   0   . OXYCODONE-ACETAMINOPHEN 10-325 MG PO TABS   Oral   Take 1 tablet by mouth every 8 (eight) hours as needed for pain.   30 tablet   0   . POLYETHYLENE GLYCOL 3350 PO POWD   Oral   Take 17 g by mouth 2 (two) times daily.   527 g   4   . SIMETHICONE 80 MG PO CHEW   Oral   Chew 1 tablet (80 mg total) by mouth every 6 (six) hours as needed for flatulence.   30 tablet   3     BP 144/70  Pulse 78  Temp 99.1 F (37.3 C) (Oral)  Resp 14  SpO2 100%  Physical Exam  Constitutional: She is oriented to person, place, and time. She appears well-developed and well-nourished.  HENT:  Head: Normocephalic and atraumatic.  Eyes: Conjunctivae normal and EOM are normal. Pupils are equal, round, and reactive to light.  Neck: Normal range of motion.  Cardiovascular: Normal rate, regular rhythm and normal heart sounds.   Pulmonary/Chest: Effort normal and breath sounds normal.  Abdominal: Soft. Bowel sounds are normal.  Musculoskeletal: Normal range of motion.  Neurological: She is alert and oriented to person, place, and time.  Skin: Skin is warm and dry.  Psychiatric: She has a normal mood and affect. Her behavior is normal.    ED Course  Procedures (including critical care time)   Labs Reviewed  CBC  RETICULOCYTES  COMPREHENSIVE METABOLIC PANEL   No results found.   No diagnosis found.    MDM  ? HBSS crisis.  Will lab,  Ivf,  Analgesia, reassess        Rosanne Ashing, MD 07/16/12 [phone]

## 2012-08-11 ENCOUNTER — Emergency Department (HOSPITAL_COMMUNITY)
Admission: EM | Admit: 2012-08-11 | Discharge: 2012-08-12 | Disposition: A | Discharge status: Home / Self Care | Payer: Medicare Other | Attending: Emergency Medicine | Admitting: Emergency Medicine

## 2012-08-11 DIAGNOSIS — R112 Nausea with vomiting, unspecified: Secondary | ICD-10-CM | POA: Insufficient documentation

## 2012-08-11 DIAGNOSIS — Z862 Personal history of diseases of the blood and blood-forming organs and certain disorders involving the immune mechanism: Secondary | ICD-10-CM | POA: Insufficient documentation

## 2012-08-11 DIAGNOSIS — F17200 Nicotine dependence, unspecified, uncomplicated: Secondary | ICD-10-CM | POA: Insufficient documentation

## 2012-08-11 DIAGNOSIS — Z3202 Encounter for pregnancy test, result negative: Secondary | ICD-10-CM | POA: Insufficient documentation

## 2012-08-11 DIAGNOSIS — Z79899 Other long term (current) drug therapy: Secondary | ICD-10-CM | POA: Insufficient documentation

## 2012-08-11 DIAGNOSIS — G8929 Other chronic pain: Secondary | ICD-10-CM

## 2012-08-11 DIAGNOSIS — E119 Type 2 diabetes mellitus without complications: Secondary | ICD-10-CM | POA: Insufficient documentation

## 2012-08-11 DIAGNOSIS — Z8679 Personal history of other diseases of the circulatory system: Secondary | ICD-10-CM | POA: Insufficient documentation

## 2012-08-11 DIAGNOSIS — D649 Anemia, unspecified: Secondary | ICD-10-CM | POA: Insufficient documentation

## 2012-08-11 DIAGNOSIS — Z8639 Personal history of other endocrine, nutritional and metabolic disease: Secondary | ICD-10-CM | POA: Insufficient documentation

## 2012-08-11 DIAGNOSIS — Z794 Long term (current) use of insulin: Secondary | ICD-10-CM | POA: Insufficient documentation

## 2012-08-11 DIAGNOSIS — D5700 Hb-SS disease with crisis, unspecified: Secondary | ICD-10-CM | POA: Insufficient documentation

## 2012-08-11 DIAGNOSIS — Z8709 Personal history of other diseases of the respiratory system: Secondary | ICD-10-CM | POA: Insufficient documentation

## 2012-08-12 ENCOUNTER — Encounter (HOSPITAL_COMMUNITY): Payer: Self-pay | Admitting: *Deleted

## 2012-08-12 ENCOUNTER — Emergency Department (HOSPITAL_COMMUNITY): Payer: Medicare Other

## 2012-08-12 LAB — URINALYSIS, ROUTINE W REFLEX MICROSCOPIC
Glucose, UA: 100 mg/dL — AB
Leukocytes, UA: NEGATIVE
Protein, ur: 30 mg/dL — AB

## 2012-08-12 LAB — COMPREHENSIVE METABOLIC PANEL
ALT: 11 U/L (ref 0–35)
AST: 18 U/L (ref 0–37)
Albumin: 4 g/dL (ref 3.5–5.2)
Alkaline Phosphatase: 57 U/L (ref 39–117)
BUN: 10 mg/dL (ref 6–23)
Potassium: 3.6 mEq/L (ref 3.5–5.1)
Sodium: 135 mEq/L (ref 135–145)
Total Protein: 8.5 g/dL — ABNORMAL HIGH (ref 6.0–8.3)

## 2012-08-12 LAB — RETICULOCYTES: Retic Ct Pct: 1.1 % (ref 0.4–3.1)

## 2012-08-12 LAB — CBC
MCHC: 30.9 g/dL (ref 30.0–36.0)
Platelets: 223 10*3/uL (ref 150–400)
RDW: 20.2 % — ABNORMAL HIGH (ref 11.5–15.5)

## 2012-08-12 LAB — URINE MICROSCOPIC-ADD ON

## 2012-08-12 LAB — PREGNANCY, URINE: Preg Test, Ur: NEGATIVE

## 2012-08-12 MED ORDER — PROMETHAZINE HCL 25 MG PO TABS: 25.0000 mg | ORAL_TABLET | Freq: Four times a day (QID) | ORAL | Status: DC | PRN

## 2012-08-12 MED ORDER — HYDROMORPHONE HCL PF 1 MG/ML IJ SOLN
1.0000 mg | Freq: Once | INTRAMUSCULAR | Status: AC
  Administered 2012-08-12: 1 mg via INTRAVENOUS
  Filled 2012-08-12: qty 1

## 2012-08-12 MED ORDER — SODIUM CHLORIDE 0.9 % IV BOLUS (SEPSIS)
1000.0000 mL | Freq: Once | INTRAVENOUS | Status: AC
  Administered 2012-08-12: 1000 mL via INTRAVENOUS

## 2012-08-12 MED ORDER — ONDANSETRON HCL 4 MG/2ML IJ SOLN
4.0000 mg | Freq: Once | INTRAMUSCULAR | Status: AC
  Administered 2012-08-12: 4 mg via INTRAVENOUS
  Filled 2012-08-12: qty 2

## 2012-08-12 MED ORDER — OXYCODONE-ACETAMINOPHEN 5-325 MG PO TABS: 2.0000 | ORAL_TABLET | ORAL | Status: DC | PRN

## 2012-08-12 MED ORDER — KETOROLAC TROMETHAMINE 30 MG/ML IJ SOLN
30.0000 mg | Freq: Once | INTRAMUSCULAR | Status: AC
  Administered 2012-08-12: 30 mg via INTRAVENOUS
  Filled 2012-08-12: qty 1

## 2012-08-12 MED ORDER — PROMETHAZINE HCL 25 MG PO TABS
25.0000 mg | ORAL_TABLET | Freq: Once | ORAL | Status: AC
  Administered 2012-08-12: 25 mg via ORAL
  Filled 2012-08-12: qty 1

## 2012-08-12 NOTE — ED Provider Notes (Addendum)
History     CSN: [id number]  Arrival date & time 08/11/12  2357   First MD Initiated Contact with Patient 08/12/12 0000      Chief Complaint  Patient presents with  . Sickle Cell Pain Crisis    (Consider location/radiation/quality/duration/timing/severity/associated sxs/prior treatment) Patient is a 37 y.o. female presenting with sickle cell pain. The history is provided by the patient.  Sickle Cell Pain Crisis  This is a new problem. The current episode started today. The problem occurs occasionally. The problem has been gradually worsening. The pain location is generalized. Site of pain is localized in bone, muscle and a joint. The pain is similar to prior episodes. The pain is severe. The symptoms are not relieved by one or more prescription drugs. Associated symptoms include nausea and vomiting. Pertinent negatives include no chest pain, no abdominal pain, no dysuria, no vaginal bleeding, no vaginal discharge, no cough and no difficulty breathing. There is no swelling present. She has been behaving normally. She has been drinking less than usual. She sickle cell type is SS. There is no history of acute chest syndrome. There have been frequent pain crises. There is no history of platelet sequestration. There is no history of stroke. There were no sick contacts. Recently, medical care has been given by the PCP.    Past Medical History  Diagnosis Date  . Diabetes mellitus   . Anemia   . Sickle cell disease   . Headache   . Seasonal allergies   . Abnormal Pap smear   . Sickle cell anemia   . Gout     Past Surgical History  Procedure Laterality Date  . Cholecystectomy    . Cesarean section      Family History  Problem Relation Age of Onset  . Hypertension Mother   . Diabetes Mother   . Hypertension Maternal Aunt   . Diabetes Maternal Aunt   . Hypertension Maternal Uncle   . Diabetes Maternal Uncle   . Hypertension Maternal Grandmother     History  Substance Use Topics   . Smoking status: Current Every Day Smoker -- 0.50 packs/day    Types: Cigarettes  . Smokeless tobacco: Not on file  . Alcohol Use: No    OB History   Grav Para Term Preterm Abortions TAB SAB Ect Mult Living   8 8 7 1  0  0   7      Review of Systems  Respiratory: Negative for cough.   Cardiovascular: Negative for chest pain.  Gastrointestinal: Positive for nausea and vomiting. Negative for abdominal pain.  Genitourinary: Negative for dysuria, vaginal bleeding and vaginal discharge.  All other systems reviewed and are negative.    Allergies  Ciprofloxacin  Home Medications   Current Outpatient Rx  Name  Route  Sig  Dispense  Refill  . ferrous sulfate 325 (65 FE) MG tablet   Oral   Take 1 tablet (325 mg total) by mouth daily with breakfast.   30 tablet   3   . glucose blood (ACCU-CHEK AVIVA) test strip      Use as instructed   200 each   12     Pt states she checks her glucose 8x a day   . glucose blood (ACCU-CHEK SMARTVIEW) test strip      Use as instructed to test blood sugar 8 times per day:  Diagnosis 250.02 pt is on insulin.   200 each   5   . glucose blood test strip  Use as instructed to test blood glucose 4-6 times per day (Pt is on insulin)   100 each   12   . hydroxyurea (HYDREA) 500 MG capsule   Oral   Take 500 mg by mouth daily. May take with food to minimize GI side effects.         Marland Kitchen ibuprofen (ADVIL,MOTRIN) 800 MG tablet   Oral   Take 1 tablet (800 mg total) by mouth every 8 (eight) hours as needed for pain.   30 tablet   1   . insulin aspart (NOVOLOG FLEXPEN) 100 UNIT/ML injection      Use as per your sliding scale   15 mL   3   . insulin glargine (LANTUS SOLOSTAR) 100 UNIT/ML injection   Subcutaneous   Inject 70 Units into the skin at bedtime.   15 mL   3   . Insulin Pen Needle 31G X 5 MM MISC   Does not apply   1 Units by Does not apply route as directed.   100 each   3   . Lancets (FREESTYLE) lancets      Use  as instructed   100 each   12   . oxyCODONE-acetaminophen (PERCOCET) 10-325 MG per tablet   Oral   Take 1 tablet by mouth every 8 (eight) hours as needed for pain.   30 tablet   0     LMP 07/02/2012  Physical Exam  Nursing note and vitals reviewed. Constitutional: She is oriented to person, place, and time. She appears well-developed and well-nourished.  HENT:  Head: Normocephalic and atraumatic.  Eyes: Conjunctivae and EOM are normal. Pupils are equal, round, and reactive to light.  Neck: Normal range of motion.  Cardiovascular: Normal rate, regular rhythm and normal heart sounds.   Pulmonary/Chest: Effort normal and breath sounds normal.  Abdominal: Soft. Bowel sounds are normal.  Musculoskeletal: Normal range of motion.  Neurological: She is alert and oriented to person, place, and time.  Skin: Skin is warm and dry.  Psychiatric: She has a normal mood and affect. Her behavior is normal.    ED Course  Procedures (including critical care time)  Labs Reviewed  CBC  COMPREHENSIVE METABOLIC PANEL  RETICULOCYTES  URINALYSIS, ROUTINE W REFLEX MICROSCOPIC  PREGNANCY, URINE   No results found.   No diagnosis found.    MDM  + sickle pain crisis.  Will lab,  Analgesia,  reassess Labs benign.  Will dc to fu,  Ret new/worsening sxs       Dyonna Jaspers Lytle Michaels, MD 08/12/12 0005  Keyuna Cuthrell Lytle Michaels, MD 08/12/12 1610

## 2012-08-12 NOTE — ED Notes (Signed)
Patient with c/o sickle cell crisis.  Patient has been having back pain, feet pain, and nausea and vomiting.

## 2012-08-12 NOTE — ED Notes (Signed)
Pt sts pain and nausea unrelieved requesting phenergan and more pain medication. Dr. Verl Bangs made aware.

## 2012-08-12 NOTE — ED Notes (Signed)
Pt asked by Clydie Braun EMT to void. sts Unable to void at this time

## 2012-08-29 ENCOUNTER — Emergency Department (HOSPITAL_COMMUNITY)
Admission: EM | Admit: 2012-08-29 | Discharge: 2012-08-30 | Disposition: A | Discharge status: Home / Self Care | Payer: Medicare Other | Source: Home / Self Care | Attending: Emergency Medicine | Admitting: Emergency Medicine

## 2012-08-29 ENCOUNTER — Encounter (HOSPITAL_COMMUNITY): Payer: Self-pay | Admitting: Emergency Medicine

## 2012-08-29 DIAGNOSIS — G8929 Other chronic pain: Secondary | ICD-10-CM

## 2012-08-29 DIAGNOSIS — R112 Nausea with vomiting, unspecified: Secondary | ICD-10-CM

## 2012-08-29 LAB — BASIC METABOLIC PANEL
CO2: 25 mEq/L (ref 19–32)
Calcium: 9.8 mg/dL (ref 8.4–10.5)
Creatinine, Ser: 0.66 mg/dL (ref 0.50–1.10)
Glucose, Bld: 187 mg/dL — ABNORMAL HIGH (ref 70–99)

## 2012-08-29 MED ORDER — HYDROMORPHONE HCL PF 1 MG/ML IJ SOLN
1.0000 mg | Freq: Once | INTRAMUSCULAR | Status: AC
  Administered 2012-08-29: 1 mg via INTRAVENOUS
  Filled 2012-08-29: qty 1

## 2012-08-29 MED ORDER — PROMETHAZINE HCL 25 MG/ML IJ SOLN
25.0000 mg | Freq: Once | INTRAMUSCULAR | Status: AC
  Administered 2012-08-29: 25 mg via INTRAVENOUS
  Filled 2012-08-29: qty 1

## 2012-08-29 MED ORDER — HYDROMORPHONE HCL PF 1 MG/ML IJ SOLN
1.0000 mg | Freq: Once | INTRAMUSCULAR | Status: AC
  Administered 2012-08-30: 1 mg via INTRAVENOUS
  Filled 2012-08-29: qty 1

## 2012-08-29 MED ORDER — SODIUM CHLORIDE 0.9 % IV BOLUS (SEPSIS)
1000.0000 mL | INTRAVENOUS | Status: AC
  Administered 2012-08-29: 1000 mL via INTRAVENOUS

## 2012-08-29 NOTE — ED Notes (Signed)
PT c/o pain all over body due to sickle cell.  Onset earlier today.  Pt also c/o nausea and vomiting

## 2012-08-29 NOTE — ED Provider Notes (Signed)
History     CSN: [id number]  Arrival date & time 08/29/12  2213   First MD Initiated Contact with Patient 08/29/12 2225      Chief Complaint  Patient presents with  . Sickle Cell Pain Crisis    (Consider location/radiation/quality/duration/timing/severity/associated sxs/prior treatment) HPI Comments: This is a 37 year old female, past medical history remarkable for sickle cell, who presents emergency department with chief complaint of nausea, vomiting, and pain all over. Patient states the pain began this morning. She states that it is associated with nausea and vomiting. She has not tried taking anything to alleviate her symptoms. Patient states this feels similar to her other episodes of sickle cell crisis. She states that her pain is 10/10.  The pain is mostly in her low back.  She denies any chest pain or shortness of breath.  She denies fever and chills.  She denies having any blood in her vomit.  The history is provided by the patient. No language interpreter was used.    Past Medical History  Diagnosis Date  . Diabetes mellitus   . Anemia   . Sickle cell disease   . Headache   . Seasonal allergies   . Abnormal Pap smear   . Sickle cell anemia   . Gout     Past Surgical History  Procedure Laterality Date  . Cholecystectomy    . Cesarean section      Family History  Problem Relation Age of Onset  . Hypertension Mother   . Diabetes Mother   . Hypertension Maternal Aunt   . Diabetes Maternal Aunt   . Hypertension Maternal Uncle   . Diabetes Maternal Uncle   . Hypertension Maternal Grandmother     History  Substance Use Topics  . Smoking status: Current Every Day Smoker -- 0.50 packs/day    Types: Cigarettes  . Smokeless tobacco: Not on file  . Alcohol Use: No    OB History   Grav Para Term Preterm Abortions TAB SAB Ect Mult Living   8 8 7 1  0  0   7      Review of Systems  All other systems reviewed and are negative.    Allergies   Ciprofloxacin  Home Medications   Current Outpatient Rx  Name  Route  Sig  Dispense  Refill  . ferrous sulfate 325 (65 FE) MG tablet   Oral   Take 1 tablet (325 mg total) by mouth daily with breakfast.   30 tablet   3   . hydroxyurea (HYDREA) 500 MG capsule   Oral   Take 500 mg by mouth daily. May take with food to minimize GI side effects.         Marland Kitchen ibuprofen (ADVIL,MOTRIN) 800 MG tablet   Oral   Take 1 tablet (800 mg total) by mouth every 8 (eight) hours as needed for pain.   30 tablet   1   . insulin aspart (NOVOLOG FLEXPEN) 100 UNIT/ML injection      Use as per your sliding scale   15 mL   3   . insulin glargine (LANTUS SOLOSTAR) 100 UNIT/ML injection   Subcutaneous   Inject 70 Units into the skin at bedtime.   15 mL   3   . oxyCODONE-acetaminophen (PERCOCET) 10-325 MG per tablet   Oral   Take 1 tablet by mouth every 8 (eight) hours as needed for pain.   30 tablet   0   . promethazine (PHENERGAN) 25 MG tablet  Oral   Take 1 tablet (25 mg total) by mouth every 6 (six) hours as needed for nausea.   10 tablet   0     BP 118/74  Pulse 105  Temp(Src) 98.5 F (36.9 C) (Oral)  Resp 18  SpO2 100%  LMP 08/15/2012  Physical Exam  Nursing note and vitals reviewed. Constitutional: She is oriented to person, place, and time. She appears well-developed and well-nourished.  HENT:  Head: Normocephalic and atraumatic.  Eyes: Conjunctivae and EOM are normal. Pupils are equal, round, and reactive to light.  Neck: Normal range of motion. Neck supple.  Cardiovascular: Normal rate and regular rhythm.  Exam reveals no gallop and no friction rub.   No murmur heard. Pulmonary/Chest: Effort normal and breath sounds normal. No respiratory distress. She has no wheezes. She has no rales. She exhibits no tenderness.  Abdominal: Soft. Bowel sounds are normal. She exhibits no distension and no mass. There is no tenderness. There is no rebound and no guarding.  No right  lower quadrant tenderness, or pain at McBurney's point, no rebound tenderness, no right upper quadrant tenderness or Murphy sign, no fluid wave or signs of peritonitis  Musculoskeletal: Normal range of motion. She exhibits no edema and no tenderness.  Neurological: She is alert and oriented to person, place, and time.  Skin: Skin is warm and dry.  Psychiatric: She has a normal mood and affect. Her behavior is normal. Judgment and thought content normal.    ED Course  Procedures (including critical care time)  Labs Reviewed  CBC WITH DIFFERENTIAL  BASIC METABOLIC PANEL   Results for orders placed during the hospital encounter of 08/29/12  CBC WITH DIFFERENTIAL      Result Value Range   WBC 11.2 (*) 4.0 - 10.5 K/uL   RBC 5.68 (*) 3.87 - 5.11 MIL/uL   Hemoglobin 10.5 (*) 12.0 - 15.0 g/dL   HCT 45.4 (*) 09.8 - 11.9 %   MCV 56.5 (*) 78.0 - 100.0 fL   MCH 18.5 (*) 26.0 - 34.0 pg   MCHC 32.7  30.0 - 36.0 g/dL   RDW 14.7 (*) 82.9 - 56.2 %   Platelets 292  150 - 400 K/uL   Neutrophils Relative 76  43 - 77 %   Lymphocytes Relative 15  12 - 46 %   Monocytes Relative 4  3 - 12 %   Eosinophils Relative 4  0 - 5 %   Basophils Relative 1  0 - 1 %   Neutro Abs 8.6 (*) 1.7 - 7.7 K/uL   Lymphs Abs 1.7  0.7 - 4.0 K/uL   Monocytes Absolute 0.4  0.1 - 1.0 K/uL   Eosinophils Absolute 0.4  0.0 - 0.7 K/uL   Basophils Absolute 0.1  0.0 - 0.1 K/uL   RBC Morphology POLYCHROMASIA PRESENT    BASIC METABOLIC PANEL      Result Value Range   Sodium 134 (*) 135 - 145 mEq/L   Potassium 3.9  3.5 - 5.1 mEq/L   Chloride 98  96 - 112 mEq/L   CO2 25  19 - 32 mEq/L   Glucose, Bld 187 (*) 70 - 99 mg/dL   BUN 13  6 - 23 mg/dL   Creatinine, Ser 1.30  0.50 - 1.10 mg/dL   Calcium 9.8  8.4 - 86.5 mg/dL   GFR calc non Af Amer >90  >90 mL/min   GFR calc Af Amer >90  >90 mL/min  RETICULOCYTES  Result Value Range   Retic Ct Pct 1.5  0.4 - 3.1 %   RBC. 5.64 (*) 3.87 - 5.11 MIL/uL   Retic Count, Manual 84.6   19.0 - 186.0 K/uL   Dg Chest Port 1 View  08/12/2012  *RADIOLOGY REPORT*  Clinical Data: Sickle cell, chest pain  PORTABLE CHEST - 1 VIEW  Comparison: 07/16/2012  Findings: Central peribronchial cuffing and mild interstitial prominence.  No confluent airspace opacity.  Heart size upper normal.  Elevated hemidiaphragms.  No pneumothorax or pleural effusion.  No acute osseous finding.  Surgical clips right upper quadrant.  IMPRESSION: Peribronchial cuffing and mild interstitial prominence may reflect bronchitis or sequelae of sickle cell.  No confluent airspace opacity.   Original Report Authenticated By: Jearld Lesch, M.D.       1. Nausea & vomiting   2. Chronic pain       MDM  37 year old female with nausea, vomiting, and chronic pain. Nausea is well-controlled with phenergan.  Pain is improved from 10/10 to 5/10.  Patient told to follow up with the sickle cell clinic.  She is not in crisis.  Believe this to be chronic pain.  Patient is stable and agreeable with the plan.       Roxy Horseman, PA-C 08/30/12 [phone]

## 2012-08-30 LAB — CBC WITH DIFFERENTIAL/PLATELET
Basophils Relative: 1 % (ref 0–1)
Eosinophils Relative: 4 % (ref 0–5)
Hemoglobin: 10.5 g/dL — ABNORMAL LOW (ref 12.0–15.0)
Lymphs Abs: 1.7 10*3/uL (ref 0.7–4.0)
MCH: 18.5 pg — ABNORMAL LOW (ref 26.0–34.0)
MCV: 56.5 fL — ABNORMAL LOW (ref 78.0–100.0)
Monocytes Absolute: 0.4 10*3/uL (ref 0.1–1.0)
Neutro Abs: 8.6 10*3/uL — ABNORMAL HIGH (ref 1.7–7.7)
Platelets: 292 10*3/uL (ref 150–400)
RBC: 5.68 MIL/uL — ABNORMAL HIGH (ref 3.87–5.11)

## 2012-08-30 MED ORDER — HYDROMORPHONE HCL PF 1 MG/ML IJ SOLN
1.0000 mg | Freq: Once | INTRAMUSCULAR | Status: AC
  Administered 2012-08-30: 1 mg via INTRAVENOUS
  Filled 2012-08-30: qty 1

## 2012-08-30 MED ORDER — OXYCODONE-ACETAMINOPHEN 5-325 MG PO TABS: 1.0000 | ORAL_TABLET | Freq: Four times a day (QID) | ORAL | Status: DC | PRN

## 2012-08-30 MED ORDER — PROMETHAZINE HCL 25 MG PO TABS: 25.0000 mg | ORAL_TABLET | Freq: Four times a day (QID) | ORAL | Status: DC | PRN

## 2012-08-30 NOTE — ED Provider Notes (Signed)
Medical screening examination/treatment/procedure(s) were conducted as a shared visit with non-physician practitioner(s) and myself.  I personally evaluated the patient during the encounter  Typical sickle cell pain in back, legs, associated with nausea and vomiting.  No fever, cough or chest pain. Similar to previous sickle cell pain.  Glynn Octave, MD 08/30/12 [phone]

## 2012-08-30 NOTE — ED Notes (Signed)
Patient verbalizes understanding of discharge and follow up instructions.  Patient has a ride and stated that she will not be driving herself home.

## 2012-09-01 ENCOUNTER — Emergency Department (HOSPITAL_COMMUNITY)
Admission: EM | Admit: 2012-09-01 | Discharge: 2012-09-01 | Discharge status: Against Medical Advice | Payer: Medicare Other | Attending: Emergency Medicine | Admitting: Emergency Medicine

## 2012-09-01 ENCOUNTER — Encounter (HOSPITAL_COMMUNITY): Payer: Self-pay | Admitting: *Deleted

## 2012-09-01 ENCOUNTER — Encounter (HOSPITAL_COMMUNITY): Payer: Self-pay | Admitting: Adult Health

## 2012-09-01 ENCOUNTER — Inpatient Hospital Stay (HOSPITAL_COMMUNITY)
Admission: EM | Admit: 2012-09-01 | Discharge: 2012-09-02 | DRG: 093 | Disposition: A | Discharge status: Home / Self Care | Payer: Medicare Other | Attending: Internal Medicine | Admitting: Internal Medicine

## 2012-09-01 DIAGNOSIS — R1013 Epigastric pain: Secondary | ICD-10-CM | POA: Diagnosis present

## 2012-09-01 DIAGNOSIS — Z794 Long term (current) use of insulin: Secondary | ICD-10-CM

## 2012-09-01 DIAGNOSIS — D5700 Hb-SS disease with crisis, unspecified: Secondary | ICD-10-CM

## 2012-09-01 DIAGNOSIS — R109 Unspecified abdominal pain: Secondary | ICD-10-CM | POA: Diagnosis present

## 2012-09-01 DIAGNOSIS — M545 Low back pain, unspecified: Secondary | ICD-10-CM | POA: Insufficient documentation

## 2012-09-01 DIAGNOSIS — E119 Type 2 diabetes mellitus without complications: Secondary | ICD-10-CM | POA: Diagnosis present

## 2012-09-01 DIAGNOSIS — M79609 Pain in unspecified limb: Secondary | ICD-10-CM | POA: Insufficient documentation

## 2012-09-01 DIAGNOSIS — F411 Generalized anxiety disorder: Secondary | ICD-10-CM | POA: Diagnosis present

## 2012-09-01 DIAGNOSIS — F17200 Nicotine dependence, unspecified, uncomplicated: Secondary | ICD-10-CM | POA: Diagnosis present

## 2012-09-01 DIAGNOSIS — D72829 Elevated white blood cell count, unspecified: Secondary | ICD-10-CM | POA: Diagnosis present

## 2012-09-01 DIAGNOSIS — K219 Gastro-esophageal reflux disease without esophagitis: Secondary | ICD-10-CM | POA: Diagnosis present

## 2012-09-01 DIAGNOSIS — R112 Nausea with vomiting, unspecified: Secondary | ICD-10-CM | POA: Insufficient documentation

## 2012-09-01 DIAGNOSIS — E1142 Type 2 diabetes mellitus with diabetic polyneuropathy: Secondary | ICD-10-CM | POA: Diagnosis present

## 2012-09-01 DIAGNOSIS — Z79899 Other long term (current) drug therapy: Secondary | ICD-10-CM

## 2012-09-01 DIAGNOSIS — M549 Dorsalgia, unspecified: Secondary | ICD-10-CM

## 2012-09-01 DIAGNOSIS — E1149 Type 2 diabetes mellitus with other diabetic neurological complication: Secondary | ICD-10-CM | POA: Diagnosis present

## 2012-09-01 DIAGNOSIS — G8929 Other chronic pain: Principal | ICD-10-CM | POA: Diagnosis present

## 2012-09-01 LAB — COMPREHENSIVE METABOLIC PANEL
ALT: 11 U/L (ref 0–35)
Alkaline Phosphatase: 59 U/L (ref 39–117)
BUN: 12 mg/dL (ref 6–23)
CO2: 19 mEq/L (ref 19–32)
GFR calc Af Amer: >90 mL/min (ref >90)
GFR calc non Af Amer: >90 mL/min (ref >90)
Glucose, Bld: 189 mg/dL — ABNORMAL HIGH (ref 70–99)
Potassium: 3.9 mEq/L (ref 3.5–5.1)
Sodium: 136 mEq/L (ref 135–145)
Total Bilirubin: 0.3 mg/dL (ref 0.3–1.2)
Total Protein: 8.7 g/dL — ABNORMAL HIGH (ref 6.0–8.3)

## 2012-09-01 LAB — URINALYSIS, MICROSCOPIC ONLY
Bilirubin Urine: NEGATIVE
Hgb urine dipstick: NEGATIVE
Ketones, ur: NEGATIVE mg/dL
Nitrite: NEGATIVE
Protein, ur: 100 mg/dL — AB
Urobilinogen, UA: 1 mg/dL (ref 0.0–1.0)

## 2012-09-01 LAB — CBC WITH DIFFERENTIAL/PLATELET
Eosinophils Relative: 1 % (ref 0–5)
HCT: 34.4 % — ABNORMAL LOW (ref 36.0–46.0)
Lymphs Abs: 1.3 10*3/uL (ref 0.7–4.0)
MCH: 17.7 pg — ABNORMAL LOW (ref 26.0–34.0)
MCV: 58 fL — ABNORMAL LOW (ref 78.0–100.0)
Monocytes Absolute: 0.6 10*3/uL (ref 0.1–1.0)
Monocytes Relative: 4 % (ref 3–12)
Neutro Abs: 12.8 10*3/uL — ABNORMAL HIGH (ref 1.7–7.7)
Platelets: 283 10*3/uL (ref 150–400)
RDW: 20.7 % — ABNORMAL HIGH (ref 11.5–15.5)

## 2012-09-01 LAB — RETICULOCYTES: Retic Ct Pct: 1.5 % (ref 0.4–3.1)

## 2012-09-01 LAB — LIPASE, BLOOD: Lipase: 70 U/L — ABNORMAL HIGH (ref 11–59)

## 2012-09-01 MED ORDER — HYDROMORPHONE HCL PF 1 MG/ML IJ SOLN
1.0000 mg | Freq: Once | INTRAMUSCULAR | Status: AC
  Administered 2012-09-01: 1 mg via INTRAVENOUS
  Filled 2012-09-01: qty 1

## 2012-09-01 MED ORDER — HYDROMORPHONE HCL PF 2 MG/ML IJ SOLN
2.0000 mg | Freq: Once | INTRAMUSCULAR | Status: AC
  Administered 2012-09-01: 2 mg via INTRAVENOUS
  Filled 2012-09-01: qty 1

## 2012-09-01 MED ORDER — ONDANSETRON HCL 4 MG/2ML IJ SOLN
4.0000 mg | Freq: Once | INTRAMUSCULAR | Status: DC
  Filled 2012-09-01: qty 2

## 2012-09-01 MED ORDER — METOCLOPRAMIDE HCL 5 MG/ML IJ SOLN
10.0000 mg | Freq: Once | INTRAMUSCULAR | Status: AC
Start: 2012-09-02 — End: 2012-09-02
  Administered 2012-09-02: 10 mg via INTRAVENOUS
  Filled 2012-09-01: qty 2

## 2012-09-01 MED ORDER — PROMETHAZINE HCL 25 MG/ML IJ SOLN
25.0000 mg | Freq: Once | INTRAMUSCULAR | Status: AC
  Administered 2012-09-01: 25 mg via INTRAVENOUS
  Filled 2012-09-01: qty 1

## 2012-09-01 MED ORDER — SODIUM CHLORIDE 0.9 % IV SOLN
Freq: Once | INTRAVENOUS | Status: AC
  Administered 2012-09-01: via INTRAVENOUS

## 2012-09-01 NOTE — ED Notes (Signed)
Pt refused lab draws until seen by physican. Began complaining of chest and SOB when told we do not have any rooms available at this time.

## 2012-09-01 NOTE — ED Provider Notes (Addendum)
History     CSN: [id number]  Arrival date & time 09/01/12  1759   First MD Initiated Contact with Patient 09/01/12 1851      Chief Complaint  Patient presents with  . Sickle Cell Pain Crisis  . Nausea  . Emesis    (Consider location/radiation/quality/duration/timing/severity/associated sxs/prior treatment) The history is provided by the patient.   patient has history of sickle cell disease reports developing nausea vomiting and generalized abdominal pain today.  She also states that she is having pain in her low back her legs in her foot that is typical of her typical sickle cell crisis.  She reports that with her sickle cell crisis episodes she also gets nausea vomiting.  She's been trying her pain medicine at home without any relief in her symptoms.  She denies melena hematochezia.  No fevers or chills.  No hematemesis.  No focal abdominal pain.  No chest pain or shortness of breath.  Her pain is severe at this time.  Nothing improves or worsens her pain.  Past Medical History  Diagnosis Date  . Diabetes mellitus   . Anemia   . Sickle cell disease   . Headache   . Seasonal allergies   . Abnormal Pap smear   . Sickle cell anemia   . Gout     Past Surgical History  Procedure Laterality Date  . Cholecystectomy    . Cesarean section      Family History  Problem Relation Age of Onset  . Hypertension Mother   . Diabetes Mother   . Hypertension Maternal Aunt   . Diabetes Maternal Aunt   . Hypertension Maternal Uncle   . Diabetes Maternal Uncle   . Hypertension Maternal Grandmother     History  Substance Use Topics  . Smoking status: Current Every Day Smoker -- 0.50 packs/day    Types: Cigarettes  . Smokeless tobacco: Never Used  . Alcohol Use: No    OB History   Grav Para Term Preterm Abortions TAB SAB Ect Mult Living   8 8 7 1  0  0   7      Review of Systems  Gastrointestinal: Positive for vomiting.  All other systems reviewed and are  negative.    Allergies  Ciprofloxacin and Zofran  Home Medications   Current Outpatient Rx  Name  Route  Sig  Dispense  Refill  . ferrous sulfate 325 (65 FE) MG tablet   Oral   Take 1 tablet (325 mg total) by mouth daily with breakfast.   30 tablet   3   . hydroxyurea (HYDREA) 500 MG capsule   Oral   Take 500 mg by mouth daily. May take with food to minimize GI side effects.         Marland Kitchen ibuprofen (ADVIL,MOTRIN) 800 MG tablet   Oral   Take 1 tablet (800 mg total) by mouth every 8 (eight) hours as needed for pain.   30 tablet   1   . insulin glargine (LANTUS SOLOSTAR) 100 UNIT/ML injection   Subcutaneous   Inject 70 Units into the skin at bedtime.   15 mL   3   . oxyCODONE-acetaminophen (PERCOCET/ROXICET) 5-325 MG per tablet   Oral   Take 1 tablet by mouth every 6 (six) hours as needed for pain.   6 tablet   0   . insulin aspart (NOVOLOG FLEXPEN) 100 UNIT/ML injection      Use as per your sliding scale   15 mL  3     BP 114/50  Pulse 101  Temp(Src) 99.3 F (37.4 C) (Oral)  Resp 17  Wt 195 lb (88.451 kg)  BMI 31.49 kg/m2  SpO2 99%  LMP 08/15/2012  Physical Exam  Nursing note and vitals reviewed. Constitutional: She is oriented to person, place, and time. She appears well-developed and well-nourished. No distress.  HENT:  Head: Normocephalic and atraumatic.  Eyes: EOM are normal.  Neck: Normal range of motion.  Cardiovascular: Normal rate, regular rhythm and normal heart sounds.   Pulmonary/Chest: Effort normal and breath sounds normal.  Abdominal: Soft. She exhibits no distension. There is no tenderness.  Musculoskeletal: Normal range of motion.  Neurological: She is alert and oriented to person, place, and time.  Skin: Skin is warm and dry.  Psychiatric: She has a normal mood and affect. Judgment normal.    ED Course  Procedures (including critical care time)  Labs Reviewed  CBC WITH DIFFERENTIAL - Abnormal; Notable for the following:     WBC 14.8 (*)    RBC 5.93 (*)    Hemoglobin 10.5 (*)    HCT 34.4 (*)    MCV 58.0 (*)    MCH 17.7 (*)    RDW 20.7 (*)    Neutrophils Relative 86 (*)    Lymphocytes Relative 9 (*)    Neutro Abs 12.8 (*)    All other components within normal limits  COMPREHENSIVE METABOLIC PANEL - Abnormal; Notable for the following:    Glucose, Bld 189 (*)    Total Protein 8.7 (*)    All other components within normal limits  LIPASE, BLOOD - Abnormal; Notable for the following:    Lipase 70 (*)    All other components within normal limits  URINALYSIS, MICROSCOPIC ONLY - Abnormal; Notable for the following:    Protein, ur 100 (*)    All other components within normal limits  RETICULOCYTES - Abnormal; Notable for the following:    RBC. 5.86 (*)    All other components within normal limits  PREGNANCY, URINE   No results found.   1. Sickle cell crisis   2. Nausea & vomiting       MDM  11:35 PM Unable to control the patient's pain the emergency apartment.  The patient be admitted to the hospital for ongoing pain control.  This appears to be his sickle cell crisis.  Abdomen benign on exam        Lyanne Co, MD 09/01/12 2337   Date: 09/01/2012  Rate: 101  Rhythm: normal sinus rhythm  QRS Axis: normal  Intervals: normal  ST/T Wave abnormalities: normal  Conduction Disutrbances: none  Narrative Interpretation:   Old EKG Reviewed: No significant changes noted     Lyanne Co, MD 09/01/12 2338

## 2012-09-01 NOTE — ED Notes (Addendum)
Presents with nausea, vomiting abdominal pain, lower back , leg and foot pain that began today and is typical of sickle cell pain crisis. Denies chest pain, SOB. Pt states she was here Friday for the same.  Refused zofran offered at triage. PT reported chest pain while triaging, EKG completed.

## 2012-09-01 NOTE — ED Notes (Signed)
Pt brought in by EMS with reports of sickle cell pain from abdomen down as well as N/V that all started this morning. Per EMS, pt was at [facility] but due to wait, left and called 911 after vomiting out in the street. Pt vomiting in floor and trash can during triage.

## 2012-09-02 ENCOUNTER — Inpatient Hospital Stay (HOSPITAL_COMMUNITY): Payer: Medicare Other

## 2012-09-02 ENCOUNTER — Encounter (HOSPITAL_COMMUNITY): Payer: Self-pay | Admitting: Internal Medicine

## 2012-09-02 DIAGNOSIS — R112 Nausea with vomiting, unspecified: Secondary | ICD-10-CM | POA: Diagnosis present

## 2012-09-02 DIAGNOSIS — R109 Unspecified abdominal pain: Secondary | ICD-10-CM | POA: Diagnosis present

## 2012-09-02 LAB — CBC WITH DIFFERENTIAL/PLATELET
Eosinophils Absolute: 0.1 10*3/uL (ref 0.0–0.7)
HCT: 28.4 % — ABNORMAL LOW (ref 36.0–46.0)
Hemoglobin: 8.8 g/dL — ABNORMAL LOW (ref 12.0–15.0)
Lymphs Abs: 1.8 10*3/uL (ref 0.7–4.0)
MCH: 17.8 pg — ABNORMAL LOW (ref 26.0–34.0)
MCV: 57.6 fL — ABNORMAL LOW (ref 78.0–100.0)
Monocytes Relative: 5 % (ref 3–12)
Neutrophils Relative %: 76 % (ref 43–77)
RBC: 4.93 MIL/uL (ref 3.87–5.11)

## 2012-09-02 LAB — COMPREHENSIVE METABOLIC PANEL
ALT: 8 U/L (ref 0–35)
AST: 11 U/L (ref 0–37)
Albumin: 3.2 g/dL — ABNORMAL LOW (ref 3.5–5.2)
Calcium: 8.2 mg/dL — ABNORMAL LOW (ref 8.4–10.5)
GFR calc Af Amer: >90 mL/min (ref >90)
Sodium: 138 mEq/L (ref 135–145)
Total Protein: 7 g/dL (ref 6.0–8.3)

## 2012-09-02 LAB — RAPID URINE DRUG SCREEN, HOSP PERFORMED
Amphetamines: NOT DETECTED
Barbiturates: NOT DETECTED
Benzodiazepines: NOT DETECTED
Tetrahydrocannabinol: NOT DETECTED

## 2012-09-02 LAB — HEMOGLOBIN A1C: Mean Plasma Glucose: 169 mg/dL — ABNORMAL HIGH (ref <117)

## 2012-09-02 MED ORDER — INSULIN GLARGINE 100 UNIT/ML ~~LOC~~ SOLN: 50.0000 [IU] | Freq: Every day | SUBCUTANEOUS | Status: DC

## 2012-09-02 MED ORDER — ENOXAPARIN SODIUM 40 MG/0.4ML ~~LOC~~ SOLN
40.0000 mg | SUBCUTANEOUS | Status: DC
  Administered 2012-09-02: 40 mg via SUBCUTANEOUS
  Filled 2012-09-02: qty 0.4

## 2012-09-02 MED ORDER — ACETAMINOPHEN 325 MG PO TABS: 650.0000 mg | ORAL_TABLET | Freq: Four times a day (QID) | ORAL | Status: DC | PRN

## 2012-09-02 MED ORDER — PROMETHAZINE HCL 25 MG/ML IJ SOLN
12.5000 mg | Freq: Once | INTRAMUSCULAR | Status: DC
  Filled 2012-09-02: qty 1

## 2012-09-02 MED ORDER — ACETAMINOPHEN 650 MG RE SUPP: 650.0000 mg | Freq: Four times a day (QID) | RECTAL | Status: DC | PRN

## 2012-09-02 MED ORDER — HYDROMORPHONE HCL PF 2 MG/ML IJ SOLN
3.0000 mg | INTRAMUSCULAR | Status: DC
  Administered 2012-09-02 (×5): 3 mg via INTRAVENOUS
  Filled 2012-09-02 (×5): qty 2

## 2012-09-02 MED ORDER — FERROUS SULFATE 325 (65 FE) MG PO TABS
325.0000 mg | ORAL_TABLET | Freq: Every day | ORAL | Status: DC
  Administered 2012-09-02: 325 mg via ORAL
  Filled 2012-09-02 (×2): qty 1

## 2012-09-02 MED ORDER — PROMETHAZINE HCL 25 MG/ML IJ SOLN
12.5000 mg | Freq: Once | INTRAMUSCULAR | Status: AC
  Administered 2012-09-02: 12.5 mg via INTRAVENOUS

## 2012-09-02 MED ORDER — IOHEXOL 300 MG/ML  SOLN
100.0000 mL | Freq: Once | INTRAMUSCULAR | Status: AC | PRN
  Administered 2012-09-02: 100 mL via INTRAVENOUS

## 2012-09-02 MED ORDER — HYDROXYUREA 500 MG PO CAPS
500.0000 mg | ORAL_CAPSULE | Freq: Every day | ORAL | Status: DC
  Administered 2012-09-02: 500 mg via ORAL
  Filled 2012-09-02 (×2): qty 1

## 2012-09-02 MED ORDER — INSULIN ASPART 100 UNIT/ML ~~LOC~~ SOLN
0.0000 [IU] | Freq: Three times a day (TID) | SUBCUTANEOUS | Status: DC
  Administered 2012-09-02: 2 [IU] via SUBCUTANEOUS

## 2012-09-02 MED ORDER — IOHEXOL 300 MG/ML  SOLN
50.0000 mL | Freq: Once | INTRAMUSCULAR | Status: AC | PRN
  Administered 2012-09-02: 50 mL via ORAL

## 2012-09-02 MED ORDER — SODIUM CHLORIDE 0.45 % IV SOLN
INTRAVENOUS | Status: DC
  Administered 2012-09-02: 05:00:00 via INTRAVENOUS

## 2012-09-02 MED ORDER — PANTOPRAZOLE SODIUM 40 MG IV SOLR
40.0000 mg | INTRAVENOUS | Status: DC
  Administered 2012-09-02: 40 mg via INTRAVENOUS
  Filled 2012-09-02 (×2): qty 40

## 2012-09-02 NOTE — Progress Notes (Signed)
Nutrition Brief Note  Patient identified on the Malnutrition Screening Tool (MST) Report  Body mass index is 30 kg/(m^2). Patient meets criteria for class I obesity based on current BMI.   Current diet order is CHO modified, no intake yet recorded. Labs and medications reviewed. Met with pt who reports nausea/vomiting for the past 2 days however states this is resolved today. Pt reports at home she had a good appetite and denied any weight loss. Pt eager to eat solid food.   No nutrition interventions warranted at this time. If nutrition issues arise, please consult RD.   Levon Hedger MS, RD, LDN [phone] Pager [phone] After Hours Pager

## 2012-09-02 NOTE — Progress Notes (Signed)
Reviewed AVS patient verbalized understanding.

## 2012-09-02 NOTE — Discharge Summary (Signed)
[name] MRN: [id number] DOB/AGE: January 02, 1976 37 y.o.  Admit date: 09/01/2012 Discharge date: 09/02/2012  Primary Care Physician:  Default, Provider, MD   Discharge Diagnoses:   Patient Active Problem List  Diagnosis  . DIABETES MELLITUS, TYPE II  . DIABETIC PERIPHERAL NEUROPATHY  . ANEMIA-NOS  . ANXIETY  . TOBACCO ABUSE  . ALLERGIC RHINITIS  . GERD  . BOILS, RECURRENT  . BACK PAIN, CHRONIC  . Narcotic abuse  . Sickle cell pain crisis  . Nausea & vomiting  . Abdominal pain    DISCHARGE MEDICATION:   Medication List    STOP taking these medications       hydroxyurea 500 MG capsule  Commonly known as:  HYDREA      TAKE these medications       ferrous sulfate 325 (65 FE) MG tablet  Take 1 tablet (325 mg total) by mouth daily with breakfast.     ibuprofen 800 MG tablet  Commonly known as:  ADVIL,MOTRIN  Take 1 tablet (800 mg total) by mouth every 8 (eight) hours as needed for pain.     insulin aspart 100 UNIT/ML injection  Commonly known as:  NOVOLOG FLEXPEN  Use as per your sliding scale     insulin glargine 100 UNIT/ML injection  Commonly known as:  LANTUS SOLOSTAR  Inject 70 Units into the skin at bedtime.     oxyCODONE-acetaminophen 5-325 MG per tablet  Commonly known as:  PERCOCET/ROXICET  Take 1 tablet by mouth every 6 (six) hours as needed for pain.          Consults:     SIGNIFICANT DIAGNOSTIC STUDIES:  Ct Abdomen Pelvis W Contrast  09/02/2012  **ADDENDUM** CREATED: 09/02/2012 03:07:48  Soft tissue attenuation along the left anterior abdominal wall rectus musculature (image 61 of series 2), not significantly changed from 2007, presumably scar tissue.  **END ADDENDUM** SIGNED BY: Waneta Martins, M.D.   09/02/2012  *RADIOLOGY REPORT*  Clinical Data: Abdominal pain, elevated lipase  CT ABDOMEN AND PELVIS WITH CONTRAST  Technique:  Multidetector CT imaging of the abdomen and pelvis was performed following the standard protocol during bolus  administration of intravenous contrast.  Contrast: OMNIPAQUE IOHEXOL 300 MG/ML  SOLN  Comparison: 03/23/2008  Findings: Limited images through the lung bases demonstrate no significant appreciable abnormality. The heart size is within normal limits. No pleural or pericardial effusion.  Unremarkable liver, spleen, adrenal glands.  Absent gallbladder.  No biliary ductal dilatation.  There is respiratory motion at the level of the pancreas.  Allowing for this, there is a nonspecific hypodense area along the pancreaticoduodenal groove as seen on series 2 image 41. Otherwise, homogeneous pancreatic enhancement.  No ductal dilatation. Questionable locule of gas along the pancreatic duct on image 39 however appears to be cystic or fat density on the delayed phase.  Nonobstructing right renal stones.  No hydronephrosis or hydroureter.  No CT evidence for colitis.  Normal appendix.  Small bowel loops are normal course and caliber.  There is a locule of gas which extends along the anti dependent margin of the duodenum on series 2 image 42.  This may be intraluminal or reflect a small ulcer.  No peri duodenal fat stranding.  No free intraperitoneal air or fluid.  No lymphadenopathy.  Normal caliber aorta and branch vessels.  Thin-walled bladder.  Nonspecific appearance to the uterus, retroverted.  Left corpus luteal cyst.  No acute osseous finding.  IMPRESSION: The images through the level of the  pancreas are degraded by motion.  There is mild hypodensity along the pancreaticoduodenal groove which may reflect volume averaging, groove pancreatitis, or duodenitis/ulcer disease. Consider EGD correlation and a non emergent MRI follow-up to exclude an underlying lesion.  Original Report Authenticated By: Jearld Lesch, M.D.    Dg Chest Port 1 View  08/12/2012  *RADIOLOGY REPORT*  Clinical Data: Sickle cell, chest pain  PORTABLE CHEST - 1 VIEW  Comparison: 07/16/2012  Findings: Central peribronchial cuffing and mild  interstitial prominence.  No confluent airspace opacity.  Heart size upper normal.  Elevated hemidiaphragms.  No pneumothorax or pleural effusion.  No acute osseous finding.  Surgical clips right upper quadrant.  IMPRESSION: Peribronchial cuffing and mild interstitial prominence may reflect bronchitis or sequelae of sickle cell.  No confluent airspace opacity.   Original Report Authenticated By: Jearld Lesch, M.D.        No results found for this or any previous visit (from the past 240 hour(s)).  BRIEF ADMITTING H & P: [name] is a 37 y.o. female with false history of sickle cell disease and diabetes mellitus type 2 has been experiencing nausea vomiting with epigastric pain last 2 days. Please note that this information about her falls history of sickle cell disease was unknown to the admitting physician.  However this is how she represented her history of present illness to the admitting physician: This is a 37 y.o. female with  history of sickle cell disease and diabetes mellitus type 2 has been experiencing nausea vomiting with epigastric pain last 2 days. In addition patient has been having generalized body ache typical of her sickle cell crisis. Patient states she has been taking her medications as advised. Denies any chest pain or shortness of breath fevers or chills and patient is not hypoxic. In the ER patient was given multiple does of pain medications despite which the patient has pain has been admitted for pain relief. Patient's labs do reveal mildly elevated lipase. CT abdomen pelvis has been ordered to rule out pancreatitis.    Hospital Course:  Present on Admission:  . Abdominal pain:Pt has chronic abdominal pain which has been treated with chronic narcotic under the pretense of a diagnosis of Sickle Cell Disease. It is clear as the cause of her abdominal pain however the patient is able to tolerate diet without any difficulty. She also has normal elimination. Her pain  today is essentially unchanged from before and I advised her to followup as an outpatient to further evaluate her pain.   .  Sickle cell pain crisis: Electrophoresis performed within the last month at [facility] confirm that patient has no sickle cell disease. She  has a hemoglobin profile of hemoglobin A  and hemoglobin G. Hemoglobin G. is not known to have any clinical symptoms associated with it and has been described as a harmless variant. In light of the fact that the patient does not have sickle cell hydroxyurea which is a chemotherapy drug and edema of his sickle cell was discontinued. I did not discontinued folic acid and will leave to the discretion of her primary care physician.  . Nausea & vomiting: Since admission the patient had one episode of nausea which was treated successfully Zonegran. The patient was unable to tolerate a normal textured diet without any difficulty.   Marland Kitchen DIABETES MELLITUS, TYPE II: Blood sugars have been under good control here in the hospital. The patient is continued on her usual medications.   Disposition and Follow-up: The  patient is discharged home in stable condition and has been advised to followup with her primary care physician as needed.     Discharge Orders   Future Orders Complete By Expires     Activity as tolerated - No restrictions  As directed     Diet Carb Modified  As directed        DISCHARGE EXAM:  General: Alert, awake, oriented x3, in no acute distress.  Vital signs: BP 122/70, HR 97, T 98.3 F (36.8 C), temperature source Oral, RR 16, height 5\' 7"  (1.702 m), weight 86.909 kg (191 lb 9.6 oz), last menstrual period 08/15/2012, SpO2 100.00%. HEENT: [place]/AT PEERL, EOMI OROPHARYNX:  Moist, No exudate/ erythema/lesions.  Heart: Regular rate and rhythm, without murmurs, rubs, gallops.  Lungs: Clear to auscultation, no wheezing or rhonchi noted.  Abdomen: Soft, nontender, nondistended, positive bowel sounds, no masses no hepatosplenomegaly  noted.  Neuro: No focal neurological deficits noted cranial nerves II through XII grossly intact. Strength normal in bilateral upper and lower extremities. Musculoskeletal: No warm swelling or erythema around joints, no spinal tenderness noted. Psychiatric: Patient alert and oriented x3, good insight and cognition, good recent to remote recall.   Recent Labs  09/01/12 1838 09/02/12 0500  NA 136 138  K 3.9 3.5  CL 101 104  CO2 19 22  GLUCOSE 189* 116*  BUN 12 9  CREATININE 0.52 0.48*  CALCIUM 9.4 8.2*    Recent Labs  09/01/12 1838 09/02/12 0500  AST 15 11  ALT 11 8  ALKPHOS 59 46  BILITOT 0.3 0.2*  PROT 8.7* 7.0  ALBUMIN 4.2 3.2*    Recent Labs  09/01/12 1838 09/02/12 0500  LIPASE 70* 52    Recent Labs  09/01/12 1838 09/02/12 0500  WBC 14.8* 10.6*  NEUTROABS 12.8* 8.1*  HGB 10.5* 8.8*  HCT 34.4* 28.4*  MCV 58.0* 57.6*  PLT 283 259   Total time spent in discharge process including decision-making face-to-face time is greater than 30 minutes Signed: MATTHEWS,MICHELLE A. 09/02/2012, 1:59 PM

## 2012-09-02 NOTE — Progress Notes (Signed)
CARE MANAGEMENT NOTE 09/02/2012  Patient:  FATHIMA, BARTL   Account Number:  [id number]  Date Initiated:  09/02/2012  Documentation initiated by:  PEARSON,COOKIE  Subjective/Objective Assessment:   Pt admitted with cco Sickle Cell Crisis     Action/Plan:   from home   Anticipated DC Date:  09/02/2012   Anticipated DC Plan:  HOME/SELF CARE         Choice offered to / List presented to:             Status of service:  In process, will continue to follow Medicare Important Message given?  NA - LOS <3 / Initial given by admissions (If response is "NO", the following Medicare IM given date fields will be blank) Date Medicare IM given:   Date Additional Medicare IM given:    Discharge Disposition:  HOME/SELF CARE  Per UR Regulation:  Reviewed for med. necessity/level of care/duration of stay  If discussed at Long Length of Stay Meetings, dates discussed:    Comments:  09/02/12 MPearson, RN, BSN Pt admitted with cco Sickle Cell Crisis. This CM went in the pt's room with Dr. Ashley Royalty.  Dr. Ashley Royalty explained to the pt that she did not have Sickle Cell and showed the pt her labs showing that Sickle Cell was negative. Pt was discharged home with no needs. Pt stated that she was going to back to her other doctors to find out who told her that she had Sickle Cell.[facility] and [employer]).

## 2012-09-02 NOTE — H&P (Addendum)
[facility] History and Physical  [name] QMV:[id number] DOB: 01/19/1976 DOA: 09/01/2012  Referring physician: Beatris Ship. PCP: Default, Provider, MD  Specialists:None.  Chief Complaint: Body aches with abdominal pain nausea and vomiting.  HPI: [name] is a 37 y.o. female with history of sickle cell disease and diabetes mellitus type 2 has been experiencing nausea vomiting with epigastric pain last 2 days. In addition patient has been having generalized body ache typical of her sickle cell crisis. Patient states she has been taking her medications as advised. Denies any chest pain or shortness of breath fevers or chills and patient is not hypoxic. In the ER patient was given multiple does of pain medications despite which the patient has pain has been admitted for pain relief. Patient's labs do reveal mildly elevated lipase. CT abdomen pelvis has been ordered to rule out pancreatitis.  Review of Systems: As presented in the history of presenting illness nothing else significant.  Past Medical History  Diagnosis Date  . Diabetes mellitus   . Anemia   . Sickle cell disease   . Headache   . Seasonal allergies   . Abnormal Pap smear   . Sickle cell anemia   . Gout    Past Surgical History  Procedure Laterality Date  . Cholecystectomy    . Cesarean section     Social History:  reports that she has been smoking Cigarettes.  She has been smoking about 0.50 packs per day. She has never used smokeless tobacco. She reports that she does not drink alcohol or use illicit drugs. Lives at home. where does patient live--home, ALF, SNF? and with whom if at home? Can do ADLs. Can patient participate in ADLs?  Allergies  Allergen Reactions  . Ciprofloxacin     REACTION: Rash  . Zofran (Ondansetron Hcl) Hives and Nausea And Vomiting    Family History  Problem Relation Age of Onset  . Hypertension Mother   . Diabetes Mother   . Hypertension Maternal Aunt   . Diabetes  Maternal Aunt   . Hypertension Maternal Uncle   . Diabetes Maternal Uncle   . Hypertension Maternal Grandmother       Prior to Admission medications   Medication Sig Start Date End Date Taking? Authorizing Provider  ferrous sulfate 325 (65 FE) MG tablet Take 1 tablet (325 mg total) by mouth daily with breakfast. 05/12/12  Yes Clanford L Johnson, MD  hydroxyurea (HYDREA) 500 MG capsule Take 500 mg by mouth daily. May take with food to minimize GI side effects.   Yes Historical Provider, MD  ibuprofen (ADVIL,MOTRIN) 800 MG tablet Take 1 tablet (800 mg total) by mouth every 8 (eight) hours as needed for pain. 05/12/12  Yes Clanford Cyndie Mull, MD  insulin glargine (LANTUS SOLOSTAR) 100 UNIT/ML injection Inject 70 Units into the skin at bedtime. 07/04/12  Yes Gwenlyn Found Copland, MD  oxyCODONE-acetaminophen (PERCOCET/ROXICET) 5-325 MG per tablet Take 1 tablet by mouth every 6 (six) hours as needed for pain. 08/30/12  Yes Roxy Horseman, PA-C  insulin aspart (NOVOLOG FLEXPEN) 100 UNIT/ML injection Use as per your sliding scale 07/04/12   Pearline Cables, MD   Physical Exam: Filed Vitals:   09/01/12 1815 09/01/12 2130 09/01/12 2206 09/02/12 0000  BP: 173/75 129/60 114/50 163/82  Pulse: 116 101 101 102  Temp: 99.3 F (37.4 C)   98.2 F (36.8 C)  TempSrc: Oral   Oral  Resp: 24 16 17    Weight: 88.451 kg (195 lb)  SpO2: 100% 98% 99% 100%     General:  Well-developed well-nourished.  Eyes: Anicteric no pallor.  ENT: No discharge from the ears eyes nose and mouth.  Neck: No mass felt.  Cardiovascular: S1-S2 heard.  Respiratory: No rhonchi or crepitations.  Abdomen: Soft nontender bowel sounds present.  Skin: No rash.  Musculoskeletal: No edema or joint effusions.  Psychiatric: Appears normal.  Neurologic: Moves all extremities. Alert awake oriented to time place and person.  Labs on Admission:  Basic Metabolic Panel:  Recent Labs Lab 08/29/12 2255 09/01/12 1838  NA 134*  136  K 3.9 3.9  CL 98 101  CO2 25 19  GLUCOSE 187* 189*  BUN 13 12  CREATININE 0.66 0.52  CALCIUM 9.8 9.4   Liver Function Tests:  Recent Labs Lab 09/01/12 1838  AST 15  ALT 11  ALKPHOS 59  BILITOT 0.3  PROT 8.7*  ALBUMIN 4.2    Recent Labs Lab 09/01/12 1838  LIPASE 70*   No results found for this basename: AMMONIA,  in the last 168 hours CBC:  Recent Labs Lab 08/29/12 2255 09/01/12 1838  WBC 11.2* 14.8*  NEUTROABS 8.6* 12.8*  HGB 10.5* 10.5*  HCT 32.1* 34.4*  MCV 56.5* 58.0*  PLT 292 283   Cardiac Enzymes: No results found for this basename: CKTOTAL, CKMB, CKMBINDEX, TROPONINI,  in the last 168 hours  BNP (last 3 results) No results found for this basename: PROBNP,  in the last 8760 hours CBG: No results found for this basename: GLUCAP,  in the last 168 hours  Radiological Exams on Admission: No results found.   Assessment/Plan Principal Problem:   Sickle cell pain crisis Active Problems:   DIABETES MELLITUS, TYPE II   Nausea & vomiting   Abdominal pain   1. Sickle cell pain crisis - continue with scheduled pain medications and home medications. Gentle hydration. 2. Nausea vomiting with abdominal pain - since patient has mildly elevated lipase CT abdomen has been ordered to check for pancreatitis. Patient has had previous history of cholecystectomy done. At this time patient has been placed on diet as tolerated to be advanced. Continue hydration. On the other hand not sure if patient is withdrawing from pain medications. Patient states she has been taking her pain medications. Check urine drug screen. 3. Diabetes mellitus type 2 - since patient still has nausea and vomiting I have decreased her Lantus home dose from 70 units to 50 units at bedtime with sliding scale coverage. Closely follow CBG. 4. Leukocytosis - patient is afebrile. Probably secondary to her inflammation and pain. Closely follow CBC.   Code Status: Full code. Family  Communication: None.  Disposition Plan: Admit to inpatient.   KAKRAKANDY,ARSHAD N. [facility] Pager [phone].  If 7PM-7AM, please contact night-coverage [email or web address] Password [facility] 09/02/2012, 12:22 AM

## 2012-09-05 ENCOUNTER — Inpatient Hospital Stay (HOSPITAL_COMMUNITY)
Admission: EM | Admit: 2012-09-05 | Discharge: 2012-09-08 | DRG: 440 | Disposition: A | Discharge status: Home / Self Care | Payer: Medicare Other | Attending: Internal Medicine | Admitting: Internal Medicine

## 2012-09-05 ENCOUNTER — Encounter (HOSPITAL_COMMUNITY): Payer: Self-pay | Admitting: *Deleted

## 2012-09-05 DIAGNOSIS — F17200 Nicotine dependence, unspecified, uncomplicated: Secondary | ICD-10-CM | POA: Diagnosis present

## 2012-09-05 DIAGNOSIS — F3289 Other specified depressive episodes: Secondary | ICD-10-CM | POA: Diagnosis present

## 2012-09-05 DIAGNOSIS — R109 Unspecified abdominal pain: Secondary | ICD-10-CM | POA: Diagnosis present

## 2012-09-05 DIAGNOSIS — Z881 Allergy status to other antibiotic agents status: Secondary | ICD-10-CM

## 2012-09-05 DIAGNOSIS — F329 Major depressive disorder, single episode, unspecified: Secondary | ICD-10-CM | POA: Diagnosis present

## 2012-09-05 DIAGNOSIS — D509 Iron deficiency anemia, unspecified: Secondary | ICD-10-CM | POA: Diagnosis present

## 2012-09-05 DIAGNOSIS — Z888 Allergy status to other drugs, medicaments and biological substances status: Secondary | ICD-10-CM

## 2012-09-05 DIAGNOSIS — K3184 Gastroparesis: Secondary | ICD-10-CM | POA: Diagnosis present

## 2012-09-05 DIAGNOSIS — K859 Acute pancreatitis without necrosis or infection, unspecified: Principal | ICD-10-CM | POA: Diagnosis present

## 2012-09-05 DIAGNOSIS — Z885 Allergy status to narcotic agent status: Secondary | ICD-10-CM

## 2012-09-05 DIAGNOSIS — G589 Mononeuropathy, unspecified: Secondary | ICD-10-CM | POA: Diagnosis present

## 2012-09-05 DIAGNOSIS — Z791 Long term (current) use of non-steroidal anti-inflammatories (NSAID): Secondary | ICD-10-CM

## 2012-09-05 DIAGNOSIS — E119 Type 2 diabetes mellitus without complications: Secondary | ICD-10-CM

## 2012-09-05 DIAGNOSIS — F411 Generalized anxiety disorder: Secondary | ICD-10-CM | POA: Diagnosis present

## 2012-09-05 DIAGNOSIS — K449 Diaphragmatic hernia without obstruction or gangrene: Secondary | ICD-10-CM | POA: Diagnosis present

## 2012-09-05 DIAGNOSIS — Z79899 Other long term (current) drug therapy: Secondary | ICD-10-CM

## 2012-09-05 DIAGNOSIS — T383X5A Adverse effect of insulin and oral hypoglycemic [antidiabetic] drugs, initial encounter: Secondary | ICD-10-CM | POA: Diagnosis present

## 2012-09-05 DIAGNOSIS — E1169 Type 2 diabetes mellitus with other specified complication: Secondary | ICD-10-CM | POA: Diagnosis present

## 2012-09-05 DIAGNOSIS — E1149 Type 2 diabetes mellitus with other diabetic neurological complication: Secondary | ICD-10-CM

## 2012-09-05 DIAGNOSIS — F1110 Opioid abuse, uncomplicated: Secondary | ICD-10-CM | POA: Diagnosis present

## 2012-09-05 DIAGNOSIS — M549 Dorsalgia, unspecified: Secondary | ICD-10-CM | POA: Diagnosis present

## 2012-09-05 DIAGNOSIS — M109 Gout, unspecified: Secondary | ICD-10-CM | POA: Diagnosis present

## 2012-09-05 DIAGNOSIS — Z9089 Acquired absence of other organs: Secondary | ICD-10-CM

## 2012-09-05 DIAGNOSIS — Y92009 Unspecified place in unspecified non-institutional (private) residence as the place of occurrence of the external cause: Secondary | ICD-10-CM

## 2012-09-05 DIAGNOSIS — J309 Allergic rhinitis, unspecified: Secondary | ICD-10-CM | POA: Diagnosis present

## 2012-09-05 DIAGNOSIS — K219 Gastro-esophageal reflux disease without esophagitis: Secondary | ICD-10-CM | POA: Diagnosis present

## 2012-09-05 DIAGNOSIS — R112 Nausea with vomiting, unspecified: Secondary | ICD-10-CM | POA: Diagnosis present

## 2012-09-05 DIAGNOSIS — G894 Chronic pain syndrome: Secondary | ICD-10-CM | POA: Diagnosis present

## 2012-09-05 DIAGNOSIS — D649 Anemia, unspecified: Secondary | ICD-10-CM

## 2012-09-05 LAB — COMPREHENSIVE METABOLIC PANEL
ALT: 14 U/L (ref 0–35)
AST: 22 U/L (ref 0–37)
CO2: 23 mEq/L (ref 19–32)
Calcium: 10.1 mg/dL (ref 8.4–10.5)
Creatinine, Ser: 0.64 mg/dL (ref 0.50–1.10)
GFR calc Af Amer: >90 mL/min (ref >90)
GFR calc non Af Amer: >90 mL/min (ref >90)
Sodium: 138 mEq/L (ref 135–145)
Total Protein: 9.1 g/dL — ABNORMAL HIGH (ref 6.0–8.3)

## 2012-09-05 LAB — CBC WITH DIFFERENTIAL/PLATELET
Basophils Relative: 1 % (ref 0–1)
Eosinophils Relative: 3 % (ref 0–5)
Hemoglobin: 11.4 g/dL — ABNORMAL LOW (ref 12.0–15.0)
MCV: 58.4 fL — ABNORMAL LOW (ref 78.0–100.0)
Monocytes Relative: 5 % (ref 3–12)
Neutrophils Relative %: 79 % — ABNORMAL HIGH (ref 43–77)
Platelets: 350 10*3/uL (ref 150–400)
RBC: 6.06 MIL/uL — ABNORMAL HIGH (ref 3.87–5.11)
WBC: 10.5 10*3/uL (ref 4.0–10.5)

## 2012-09-05 LAB — URINALYSIS, ROUTINE W REFLEX MICROSCOPIC
Glucose, UA: NEGATIVE mg/dL
Nitrite: NEGATIVE
Specific Gravity, Urine: 1.017 (ref 1.005–1.030)
pH: 7 (ref 5.0–8.0)

## 2012-09-05 LAB — GLUCOSE, CAPILLARY
Glucose-Capillary: 74 mg/dL (ref 70–99)
Glucose-Capillary: 62 mg/dL — ABNORMAL LOW (ref 70–99)
Glucose-Capillary: 97 mg/dL (ref 70–99)
Glucose-Capillary: 59 mg/dL — ABNORMAL LOW (ref 70–99)
Glucose-Capillary: 78 mg/dL (ref 70–99)

## 2012-09-05 LAB — URINE MICROSCOPIC-ADD ON

## 2012-09-05 MED ORDER — HYDROXYUREA 500 MG PO CAPS
500.0000 mg | ORAL_CAPSULE | Freq: Two times a day (BID) | ORAL | Status: DC
  Filled 2012-09-05: qty 1

## 2012-09-05 MED ORDER — PROMETHAZINE HCL 25 MG/ML IJ SOLN
25.0000 mg | Freq: Four times a day (QID) | INTRAMUSCULAR | Status: DC | PRN
  Administered 2012-09-05: 25 mg via INTRAVENOUS
  Filled 2012-09-05: qty 1

## 2012-09-05 MED ORDER — FENTANYL CITRATE 0.05 MG/ML IJ SOLN
100.0000 ug | Freq: Once | INTRAMUSCULAR | Status: AC
  Administered 2012-09-05: 100 ug via INTRAVENOUS

## 2012-09-05 MED ORDER — INSULIN GLARGINE 100 UNIT/ML ~~LOC~~ SOLN: 70.0000 [IU] | Freq: Every day | SUBCUTANEOUS | Status: DC

## 2012-09-05 MED ORDER — PROMETHAZINE HCL 25 MG/ML IJ SOLN
25.0000 mg | Freq: Once | INTRAMUSCULAR | Status: AC
  Administered 2012-09-05: 25 mg via INTRAVENOUS
  Filled 2012-09-05: qty 1

## 2012-09-05 MED ORDER — METOCLOPRAMIDE HCL 5 MG/ML IJ SOLN
10.0000 mg | Freq: Once | INTRAMUSCULAR | Status: AC
  Administered 2012-09-05: 10 mg via INTRAVENOUS
  Filled 2012-09-05: qty 2

## 2012-09-05 MED ORDER — PROMETHAZINE HCL 25 MG/ML IJ SOLN
25.0000 mg | INTRAMUSCULAR | Status: DC | PRN
  Administered 2012-09-05 – 2012-09-08 (×14): 25 mg via INTRAVENOUS
  Filled 2012-09-05 (×15): qty 1

## 2012-09-05 MED ORDER — FENTANYL CITRATE 0.05 MG/ML IJ SOLN
50.0000 ug | Freq: Once | INTRAMUSCULAR | Status: AC
  Administered 2012-09-05: 50 ug via INTRAVENOUS
  Filled 2012-09-05: qty 2

## 2012-09-05 MED ORDER — INSULIN ASPART 100 UNIT/ML ~~LOC~~ SOLN: 10.0000 [IU] | SUBCUTANEOUS | Status: DC

## 2012-09-05 MED ORDER — DEXTROSE-NACL 5-0.9 % IV SOLN
INTRAVENOUS | Status: DC
  Administered 2012-09-05 – 2012-09-07 (×3): via INTRAVENOUS
  Administered 2012-09-07: 800 mL via INTRAVENOUS

## 2012-09-05 MED ORDER — DEXTROSE 50 % IV SOLN
25.0000 mL | Freq: Once | INTRAVENOUS | Status: AC | PRN
  Administered 2012-09-05: 25 mL via INTRAVENOUS

## 2012-09-05 MED ORDER — INSULIN ASPART 100 UNIT/ML ~~LOC~~ SOLN: 0.0000 [IU] | Freq: Three times a day (TID) | SUBCUTANEOUS | Status: DC

## 2012-09-05 MED ORDER — SODIUM CHLORIDE 0.9 % IV SOLN: INTRAVENOUS | Status: DC

## 2012-09-05 MED ORDER — OXYCODONE-ACETAMINOPHEN 10-325 MG PO TABS: 1.0000 | ORAL_TABLET | ORAL | Status: DC | PRN

## 2012-09-05 MED ORDER — LORAZEPAM 2 MG/ML IJ SOLN
INTRAMUSCULAR | Status: AC
  Administered 2012-09-05: 1 mg
  Filled 2012-09-05: qty 1

## 2012-09-05 MED ORDER — OXYCODONE HCL 5 MG PO TABS: 5.0000 mg | ORAL_TABLET | ORAL | Status: DC | PRN

## 2012-09-05 MED ORDER — DICYCLOMINE HCL 10 MG/ML IM SOLN
20.0000 mg | Freq: Once | INTRAMUSCULAR | Status: AC
  Administered 2012-09-05: 20 mg via INTRAMUSCULAR
  Filled 2012-09-05: qty 2

## 2012-09-05 MED ORDER — OXYCODONE-ACETAMINOPHEN 5-325 MG PO TABS
1.0000 | ORAL_TABLET | ORAL | Status: DC | PRN
  Administered 2012-09-06: 1 via ORAL
  Filled 2012-09-05 (×2): qty 1

## 2012-09-05 MED ORDER — DEXTROSE 50 % IV SOLN
INTRAVENOUS | Status: AC
  Administered 2012-09-05: 25 mL
  Filled 2012-09-05: qty 50

## 2012-09-05 MED ORDER — INSULIN ASPART 100 UNIT/ML ~~LOC~~ SOLN
0.0000 [IU] | SUBCUTANEOUS | Status: DC
  Administered 2012-09-06: 1 [IU] via SUBCUTANEOUS
  Administered 2012-09-07: 3 [IU] via SUBCUTANEOUS
  Administered 2012-09-07 (×3): 2 [IU] via SUBCUTANEOUS
  Administered 2012-09-07 – 2012-09-08 (×2): 3 [IU] via SUBCUTANEOUS
  Administered 2012-09-08: 1 [IU] via SUBCUTANEOUS

## 2012-09-05 MED ORDER — HYDROMORPHONE HCL PF 1 MG/ML IJ SOLN
0.5000 mg | INTRAMUSCULAR | Status: DC | PRN
  Administered 2012-09-05 (×2): 0.5 mg via INTRAVENOUS
  Filled 2012-09-05 (×2): qty 1

## 2012-09-05 MED ORDER — HYDROMORPHONE HCL PF 1 MG/ML IJ SOLN
1.0000 mg | INTRAMUSCULAR | Status: DC | PRN
  Administered 2012-09-06 (×2): 1.5 mg via INTRAVENOUS
  Filled 2012-09-05 (×2): qty 2

## 2012-09-05 MED ORDER — HYDROMORPHONE HCL PF 1 MG/ML IJ SOLN
1.0000 mg | INTRAMUSCULAR | Status: DC | PRN
  Administered 2012-09-05 (×2): 1 mg via INTRAVENOUS
  Filled 2012-09-05 (×2): qty 1

## 2012-09-05 MED ORDER — FENOFIBRATE 54 MG PO TABS
54.0000 mg | ORAL_TABLET | Freq: Every day | ORAL | Status: DC
  Administered 2012-09-05 – 2012-09-07 (×3): 54 mg via ORAL
  Filled 2012-09-05 (×3): qty 1

## 2012-09-05 MED ORDER — METOCLOPRAMIDE HCL 5 MG/ML IJ SOLN
10.0000 mg | Freq: Once | INTRAMUSCULAR | Status: AC
  Administered 2012-09-05: 10 mg via INTRAVENOUS

## 2012-09-05 MED ORDER — INSULIN ASPART 100 UNIT/ML ~~LOC~~ SOLN: 0.0000 [IU] | Freq: Every day | SUBCUTANEOUS | Status: DC

## 2012-09-05 MED ORDER — SODIUM CHLORIDE 0.9 % IV BOLUS (SEPSIS)
1000.0000 mL | Freq: Once | INTRAVENOUS | Status: AC
  Administered 2012-09-05: 1000 mL via INTRAVENOUS

## 2012-09-05 MED ORDER — ONDANSETRON HCL 4 MG/2ML IJ SOLN: 4.0000 mg | Freq: Four times a day (QID) | INTRAMUSCULAR | Status: DC | PRN

## 2012-09-05 MED ORDER — SODIUM CHLORIDE 0.9 % IV SOLN
INTRAVENOUS | Status: DC
  Administered 2012-09-05: 16:00:00 via INTRAVENOUS

## 2012-09-05 NOTE — ED Notes (Signed)
Notified RN of CBG 74 

## 2012-09-05 NOTE — ED Notes (Signed)
Patient c/o n/v x multiple episodes since last night, patient also with abdominal cramping and back pain

## 2012-09-05 NOTE — H&P (Signed)
[facility] History and Physical  TANGEE MARSZALEK YNW:[id number] DOB: 1975/11/03 DOA: 09/05/2012  Referring physician: ER physician PCP: Default, Provider, MD   Chief Complaint: abdominal pain  HPI:  37 year old female with past medical history narcotic abuse, anemia (no history of sickle cell disease), DM, recently discharged from [facility] 09/02/12 (hospitalized for generalized pain what he claimed due to sickle cell) and now comes to [facility] D with complaints of worsening abdominal pain in mid abdominal area associated with intractable nausea and vomiting. Patient reported pain is 10/10in intensity, non radiating and it started 1 day prior to this admission. There is no associated fever or chills, no diarrhea or constipation. No chest pain, no shortness of breath and no cough. In D, patient was found to have lipase level of 96. She was afebrile and hemodynamically stable. She is admitted for observation to [facility] service.  Assessment and Plan:  Principal Problem:   *Acute pancreatitis  Lipase level on admission 96  Continue IV fluids, NPO and antiemetic  Pain management with dilaudid 0.5 mg IV Q 4 hours PRN Active Problems:   Abdominal pain, Nausea & vomiting  Secondary to acute pancreatitis  Management as above with IV fluids, NPO , antiemetics an d analgesia   DIABETES MELLITUS, TYPE II  Continue lantus 70 units QHS and sliding scale   ANEMIA-NOS   Continue folic acid.  No hydroxyurea as she does not have sickle cell disease  Code Status: Full Family Communication: Pt at bedside Disposition Plan: Admit for further evaluation  Manson Passey, MD  [facility] Pager [phone]  If 7PM-7AM, please contact night-coverage [email or web address] Password [email or web address] 09/05/2012, 3:31 PM  Review of Systems:  Constitutional: Negative for fever, chills and malaise/fatigue. Negative for diaphoresis.  HENT: Negative for hearing loss, ear pain, nosebleeds, congestion, sore throat, neck pain, tinnitus and ear  discharge.   Eyes: Negative for blurred vision, double vision, photophobia, pain, discharge and redness.  Respiratory: Negative for cough, hemoptysis, sputum production, shortness of breath, wheezing and stridor.   Cardiovascular: Negative for chest pain, palpitations, orthopnea, claudication and leg swelling.  Gastrointestinal: per HPI  Genitourinary: Negative for dysuria, urgency, frequency, hematuria and flank pain.  Musculoskeletal: Negative for myalgias, back pain, joint pain and falls.  Skin: Negative for itching and rash.  Neurological: Negative for dizziness and weakness. Negative for tingling, tremors, sensory change, speech change, focal weakness, loss of consciousness and headaches.  Endo/Heme/Allergies: Negative for environmental allergies and polydipsia. Does not bruise/bleed easily.  Psychiatric/Behavioral: Negative for suicidal ideas. The patient is not nervous/anxious.      Past Medical History  Diagnosis Date  . Diabetes mellitus   . Anemia   . Sickle cell disease   . Headache   . Seasonal allergies   . Abnormal Pap smear   . Sickle cell anemia   . Gout    Past Surgical History  Procedure Laterality Date  . Cholecystectomy    . Cesarean section     Social History:  reports that she has been smoking Cigarettes.  She has been smoking about 0.50 packs per day. She has never used smokeless tobacco. She reports that she does not drink alcohol or use illicit drugs.  Allergies  Allergen Reactions  . Ciprofloxacin     REACTION: Rash  . Morphine And Related Hives  . Zofran (Ondansetron Hcl) Hives and Nausea And Vomiting    Family History:  Family History  Problem Relation Age of Onset  . Hypertension Mother   . Diabetes Mother   .  Hypertension Maternal Aunt   . Diabetes Maternal Aunt   . Hypertension Maternal Uncle   . Diabetes Maternal Uncle   . Hypertension Maternal Grandmother      Prior to Admission medications   Medication Sig Start Date End Date  Taking? Authorizing Provider  fenofibrate (TRICOR) 48 MG tablet Take 48 mg by mouth daily.   Yes Historical Provider, MD  hydroxyurea (HYDREA) 500 MG capsule Take 500 mg by mouth 2 (two) times daily. May take with food to minimize GI side effects.   Yes Historical Provider, MD  ibuprofen (ADVIL,MOTRIN) 800 MG tablet Take 1 tablet (800 mg total) by mouth every 8 (eight) hours as needed for pain. 05/12/12  Yes Clanford Cyndie Mull, MD  insulin aspart (NOVOLOG) 100 UNIT/ML injection Inject 10-20 Units into the skin See admin instructions. Sliding scale   Yes Historical Provider, MD  insulin glargine (LANTUS SOLOSTAR) 100 UNIT/ML injection Inject 70 Units into the skin at bedtime. 07/04/12  Yes Gwenlyn Found Copland, MD  oxyCODONE-acetaminophen (PERCOCET) 10-325 MG per tablet Take 1 tablet by mouth every 4 (four) hours as needed for pain.   Yes Historical Provider, MD   Physical Exam: Filed Vitals:   09/05/12 0746 09/05/12 1201 09/05/12 1316 09/05/12 1500  BP: 169/79 171/62 148/65 142/68  Pulse: 89 82 82 84  Temp: 100.7 F (38.2 C) 99.5 F (37.5 C)  99.1 F (37.3 C)  TempSrc: Oral Oral  Oral  Resp: 16 16 16 18   SpO2: 100% 100% 100% 100%    Physical Exam  Constitutional: Appears well-developed and well-nourished. No distress.  HENT: Normocephalic. External right and left ear normal. Oropharynx is clear and moist.  Eyes: Conjunctivae and EOM are normal. PERRLA, no scleral icterus.  Neck: Normal ROM. Neck supple. No JVD. No tracheal deviation. No thyromegaly.  CVS: RRR, S1/S2 +, no murmurs, no gallops, no carotid bruit.  Pulmonary: Effort and breath sounds normal, no stridor, rhonchi, wheezes, rales.  Abdominal: Soft. BS +,  no distension, tenderness in mid abdomen, no rebound or guarding.  Musculoskeletal: Normal range of motion. No edema and no tenderness.  Lymphadenopathy: No lymphadenopathy noted, cervical, inguinal. Neuro: Alert. Normal reflexes, muscle tone coordination. No cranial nerve  deficit. Skin: Skin is warm and dry. No rash noted. Not diaphoretic. No erythema. No pallor.  Psychiatric: Normal mood and affect. Behavior, judgment, thought content normal.   Labs on Admission:  Basic Metabolic Panel:  Recent Labs Lab 08/29/12 2255 09/01/12 1838 09/02/12 0500 09/05/12 0810  NA 134* 136 138 138  K 3.9 3.9 3.5 3.7  CL 98 101 104 102  CO2 25 19 22 23   GLUCOSE 187* 189* 116* 70  BUN 13 12 9 10   CREATININE 0.66 0.52 0.48* 0.64  CALCIUM 9.8 9.4 8.2* 10.1   Liver Function Tests:  Recent Labs Lab 09/01/12 1838 09/02/12 0500 09/05/12 0810  AST 15 11 22   ALT 11 8 14   ALKPHOS 59 46 63  BILITOT 0.3 0.2* 0.3  PROT 8.7* 7.0 9.1*  ALBUMIN 4.2 3.2* 4.4    Recent Labs Lab 09/01/12 1838 09/02/12 0500 09/05/12 0810  LIPASE 70* 52 96*   No results found for this basename: AMMONIA,  in the last 168 hours CBC:  Recent Labs Lab 08/29/12 2255 09/01/12 1838 09/02/12 0500 09/05/12 0810  WBC 11.2* 14.8* 10.6* 10.5  NEUTROABS 8.6* 12.8* 8.1* 8.3*  HGB 10.5* 10.5* 8.8* 11.4*  HCT 32.1* 34.4* 28.4* 35.4*  MCV 56.5* 58.0* 57.6* 58.4*  PLT 292 283 259  350   Cardiac Enzymes: No results found for this basename: CKTOTAL, CKMB, CKMBINDEX, TROPONINI,  in the last 168 hours BNP: No components found with this basename: POCBNP,  CBG:  Recent Labs Lab 09/02/12 0743 09/02/12 1215 09/05/12 0809  GLUCAP 110* 155* 74    Radiological Exams on Admission: No results found.  EKG: Normal sinus rhythm, no ST/T wave changes

## 2012-09-05 NOTE — ED Provider Notes (Signed)
History     CSN: [id number]  Arrival date & time 09/05/12  4098   First MD Initiated Contact with Patient 09/05/12 [phone]      No chief complaint on file.   (Consider location/radiation/quality/duration/timing/severity/associated sxs/prior treatment) The history is provided by the patient and medical records. No language interpreter was used.    [name] is a 37 y.o. female with complaint of gastrointestinal symptoms of upper abdominal cramps, nausea, vomiting, anorexia, dehydration for onset approx 3 am this morning.  Patient denies bilious or bloody vomit.  The patient denies having any diarrhea.  She's past surgical history of cesarean section and cholecystectomy.  Patient's daughter lives in the household and has similar symptoms.  She has a past medical history significant for diabetes. The patient has been treated here many times for sickle cell pain.  She states that she thinks her pain might be related to sickle cell.  Per records the patient had a negative sickle cell screen in 2008.  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 Ashley Royalty, which is a benign variant that cannot cause crisis or pain.  I let the patient know that her pain could not be due to sickle cell crisis.    Past Medical History  Diagnosis Date  . Diabetes mellitus   . Anemia   . Sickle cell disease   . Headache   . Seasonal allergies   . Abnormal Pap smear   . Sickle cell anemia   . Gout     Past Surgical History  Procedure Laterality Date  . Cholecystectomy    . Cesarean section      Family History  Problem Relation Age of Onset  . Hypertension Mother   . Diabetes Mother   . Hypertension Maternal Aunt   . Diabetes Maternal Aunt   . Hypertension Maternal Uncle   . Diabetes Maternal Uncle   . Hypertension Maternal Grandmother     History  Substance Use Topics  . Smoking status: Current Every Day Smoker -- 0.50 packs/day    Types: Cigarettes  . Smokeless tobacco: Never Used  . Alcohol Use: No     OB History   Grav Para Term Preterm Abortions TAB SAB Ect Mult Living   8 8 7 1  0  0   7      Review of Systems  Constitutional: Positive for fever, chills and appetite change.  HENT: Negative for trouble swallowing and voice change.   Respiratory: Negative for chest tightness and shortness of breath.   Cardiovascular: Negative for chest pain and palpitations.  Gastrointestinal: Positive for nausea, vomiting and abdominal pain. Negative for diarrhea.  Genitourinary: Negative for dysuria and hematuria.  Musculoskeletal: Positive for back pain (chronic).  Skin: Negative for rash.  Neurological: Positive for syncope and weakness.      Allergies  Ciprofloxacin and Zofran  Home Medications   Current Outpatient Rx  Name  Route  Sig  Dispense  Refill  . ferrous sulfate 325 (65 FE) MG tablet   Oral   Take 1 tablet (325 mg total) by mouth daily with breakfast.   30 tablet   3   . ibuprofen (ADVIL,MOTRIN) 800 MG tablet   Oral   Take 1 tablet (800 mg total) by mouth every 8 (eight) hours as needed for pain.   30 tablet   1   . insulin aspart (NOVOLOG FLEXPEN) 100 UNIT/ML injection      Use as per your sliding scale   15 mL  3   . insulin glargine (LANTUS SOLOSTAR) 100 UNIT/ML injection   Subcutaneous   Inject 70 Units into the skin at bedtime.   15 mL   3   . oxyCODONE-acetaminophen (PERCOCET/ROXICET) 5-325 MG per tablet   Oral   Take 1 tablet by mouth every 6 (six) hours as needed for pain.   6 tablet   0     LMP 08/15/2012  Physical Exam  Vitals reviewed. Constitutional: She is oriented to person, place, and time. She appears well-developed and well-nourished. No distress.  HENT:  Head: Normocephalic and atraumatic.  Eyes: Conjunctivae are normal. No scleral icterus.  Neck: Normal range of motion.  Cardiovascular: Normal rate, regular rhythm and normal heart sounds.  Exam reveals no gallop and no friction rub.   No murmur heard. Pulmonary/Chest:  Effort normal and breath sounds normal. No respiratory distress.  Abdominal: Soft. Bowel sounds are normal. She exhibits no distension and no mass. There is no tenderness. There is no guarding.  Neurological: She is alert and oriented to person, place, and time.  Skin: Skin is warm and dry. She is not diaphoretic.    ED Course  Procedures (including critical care time)  Labs Reviewed - No data to display No results found.   1. Nausea & vomiting   2. Abdominal pain   3. Acute pancreatitis   4. Anemia, unspecified   5. Type II or unspecified type diabetes mellitus with neurological manifestations, not stated as uncontrolled(250.60)       MDM  8:42 AM BP 169/79  Pulse 89  Temp(Src) 100.7 F (38.2 C) (Oral)  Resp 16  SpO2 100%  LMP 08/15/2012  Patient with previous cholecystectomy.  Labs pending.  Patient may have gastritis vs opiate withdrawal .  I suspect acute abdomen this patient has no focal abdominal tenderness or peritoneal signs.   Patient with mildly elevated lipase. WIll continue to  [facility] and treat symptoms with fentanyl and antiemetics. Patient has frequently requested dilaudid.  Patient has had several rounds of antiemetics including phenergan and metoclopramide.  Patient is allergic to zofran.  She is still actively vomiting and complaining of pain. Unable to hold down PO fluids.  Will admit for sxs control.     Arthor Captain, PA-C 09/08/12 1541

## 2012-09-05 NOTE — Progress Notes (Signed)
Utilization review completed.  P.J. Minnich,RN,BSN Case Manager [phone]  

## 2012-09-05 NOTE — ED Notes (Signed)
Patient vomiting and stating pain increasing, PA notified and medication orders received

## 2012-09-05 NOTE — Care Management Note (Signed)
    Page 1 of 1   09/08/2012     2:37:03 PM   CARE MANAGEMENT NOTE 09/08/2012  Patient:  [name], [name]   Account Number:  [id number]  Date Initiated:  09/05/2012  Documentation initiated by:  Letha Cape  Subjective/Objective Assessment:   dx n/v/abd pain  admit     Action/Plan:   Anticipated DC Date:  09/08/2012   Anticipated DC Plan:  HOME/SELF CARE      DC Planning Services  CM consult      Choice offered to / List presented to:             Status of service:  Completed, signed off Medicare Important Message given?   (If response is "NO", the following Medicare IM given date fields will be blank) Date Medicare IM given:   Date Additional Medicare IM given:    Discharge Disposition:  HOME/SELF CARE  Per UR Regulation:  Reviewed for med. necessity/level of care/duration of stay  If discussed at Long Length of Stay Meetings, dates discussed:    Comments:  09/08/12 14:36 Letha Cape RN, BSN [phone] patient dc to home today, no needs anticipated.  09/05/12 16:58 Letha Cape RN, BSN [phone] patient from home, pta indep. NCM will continue to follow for dc needs.

## 2012-09-06 DIAGNOSIS — E1149 Type 2 diabetes mellitus with other diabetic neurological complication: Secondary | ICD-10-CM

## 2012-09-06 LAB — GLUCOSE, CAPILLARY: Glucose-Capillary: 121 mg/dL — ABNORMAL HIGH (ref 70–99)

## 2012-09-06 LAB — COMPREHENSIVE METABOLIC PANEL
AST: 18 U/L (ref 0–37)
Alkaline Phosphatase: 54 U/L (ref 39–117)
BUN: 9 mg/dL (ref 6–23)
CO2: 21 mEq/L (ref 19–32)
Chloride: 102 mEq/L (ref 96–112)
Creatinine, Ser: 0.58 mg/dL (ref 0.50–1.10)
GFR calc non Af Amer: >90 mL/min (ref >90)
Total Bilirubin: 0.4 mg/dL (ref 0.3–1.2)

## 2012-09-06 LAB — CBC
MCH: 18.2 pg — ABNORMAL LOW (ref 26.0–34.0)
Platelets: 291 10*3/uL (ref 150–400)
RBC: 5.56 MIL/uL — ABNORMAL HIGH (ref 3.87–5.11)
RDW: 20.6 % — ABNORMAL HIGH (ref 11.5–15.5)
WBC: 8.4 10*3/uL (ref 4.0–10.5)

## 2012-09-06 MED ORDER — OXYCODONE-ACETAMINOPHEN 5-325 MG PO TABS
2.0000 | ORAL_TABLET | ORAL | Status: DC | PRN
  Administered 2012-09-06 – 2012-09-08 (×6): 2 via ORAL
  Filled 2012-09-06 (×7): qty 2

## 2012-09-06 MED ORDER — METOCLOPRAMIDE HCL 5 MG/ML IJ SOLN
5.0000 mg | Freq: Three times a day (TID) | INTRAMUSCULAR | Status: DC
  Administered 2012-09-06 – 2012-09-08 (×5): 5 mg via INTRAVENOUS
  Filled 2012-09-06 (×10): qty 1

## 2012-09-06 MED ORDER — HYDROMORPHONE HCL PF 1 MG/ML IJ SOLN
1.0000 mg | INTRAMUSCULAR | Status: DC | PRN
  Administered 2012-09-06 – 2012-09-08 (×10): 1 mg via INTRAVENOUS
  Filled 2012-09-06 (×10): qty 1

## 2012-09-06 MED ORDER — INSULIN GLARGINE 100 UNIT/ML ~~LOC~~ SOLN
20.0000 [IU] | Freq: Every day | SUBCUTANEOUS | Status: DC
  Administered 2012-09-06 – 2012-09-07 (×2): 20 [IU] via SUBCUTANEOUS

## 2012-09-06 MED ORDER — POTASSIUM CHLORIDE CRYS ER 20 MEQ PO TBCR
40.0000 meq | EXTENDED_RELEASE_TABLET | Freq: Once | ORAL | Status: AC
  Administered 2012-09-06: 40 meq via ORAL
  Filled 2012-09-06: qty 2

## 2012-09-06 MED ORDER — PANTOPRAZOLE SODIUM 40 MG IV SOLR
40.0000 mg | Freq: Two times a day (BID) | INTRAVENOUS | Status: DC
  Administered 2012-09-06 – 2012-09-08 (×5): 40 mg via INTRAVENOUS
  Filled 2012-09-06 (×6): qty 40

## 2012-09-06 MED ORDER — OXYCODONE-ACETAMINOPHEN 10-325 MG PO TABS: 1.0000 | ORAL_TABLET | Freq: Four times a day (QID) | ORAL | Status: DC | PRN

## 2012-09-06 MED ORDER — ENOXAPARIN SODIUM 40 MG/0.4ML ~~LOC~~ SOLN
40.0000 mg | SUBCUTANEOUS | Status: DC
  Filled 2012-09-06 (×4): qty 0.4

## 2012-09-06 MED ORDER — HYDROMORPHONE HCL PF 1 MG/ML IJ SOLN
0.5000 mg | INTRAMUSCULAR | Status: DC | PRN
  Administered 2012-09-06 (×2): 0.5 mg via INTRAVENOUS
  Filled 2012-09-06 (×3): qty 1

## 2012-09-06 MED ORDER — OXYCODONE HCL 5 MG PO TABS: 5.0000 mg | ORAL_TABLET | Freq: Four times a day (QID) | ORAL | Status: DC | PRN

## 2012-09-06 MED ORDER — OXYCODONE-ACETAMINOPHEN 5-325 MG PO TABS
1.0000 | ORAL_TABLET | Freq: Four times a day (QID) | ORAL | Status: DC | PRN
  Administered 2012-09-06: 1 via ORAL

## 2012-09-06 MED ORDER — METOCLOPRAMIDE HCL 5 MG/ML IJ SOLN
5.0000 mg | Freq: Once | INTRAMUSCULAR | Status: AC
  Administered 2012-09-06: 5 mg via INTRAVENOUS
  Filled 2012-09-06: qty 1

## 2012-09-06 NOTE — Progress Notes (Signed)
Hypoglycemic Event  CBG: 59  Treatment: 25mL D50 given at 21:24   Symptoms: none  Follow-up CBG: Time:21:39 CBG Result:78  Possible Reasons for Event: Patient NPO with no dextrose in fluids  Comments/MD notified:MD notified and new orders placed for IV fluids D5NS@75     Faucette, Joan Mayans  Remember to initiate Hypoglycemia Order Set & complete

## 2012-09-06 NOTE — Progress Notes (Signed)
PATIENT DETAILS Name: [name] Age: 37 y.o. Sex: female Date of Birth: [birth year] Admit Date: 09/05/2012 Admitting Physician Alison Murray, MD [id number], Provider, MD  Subjective: Still complaining of abdominal pain, + nausea  Assessment/Plan: Principal Problem: Abdominal Pain -lipase mildly elevated-not sure whether this is pancreatitis or Duodenitis -start PPI -Minimize narcotics as much as possible -start clears -supportive care  Active Problems:  DM with hypoglycemia -decrease Lantus to 20 units -cautiously continue with SSI for now-as we are attempting to see if she can have oral intake-if not -will need to d/c it if hypoglycemia persists  Anemia -does not Sickle cell anemia -monitor Hb periodically  Chronic Pain syndrome -has h/o chronic abd pain -will attempt to minimize narcotics-will place back on percocet  Disposition: Remain inpatient  DVT Prophylaxis: Prophylactic Lovenox  Code Status: Full code  Procedures:  None  CONSULTS:  None  PHYSICAL EXAM: Vital signs in last 24 hours: Filed Vitals:   09/05/12 1500 09/05/12 1900 09/05/12 2118 09/06/12 0439  BP: 142/68  155/83 109/69  Pulse: 84  92 87  Temp: 99.1 F (37.3 C)  99.4 F (37.4 C) 98.6 F (37 C)  TempSrc: Oral  Oral Oral  Resp: 18  16 18   Height:  5\' 7"  (1.702 m)    Weight:  85.6 kg (188 lb 11.4 oz)    SpO2: 100%  100% 98%    Weight change:  Body mass index is 29.55 kg/(m^2).   Gen Exam: Awake and alert with clear speech.   Neck: Supple, No JVD.   Chest: B/L Clear.   CVS: S1 S2 Regular, no murmurs.  Abdomen: soft, BS +, non tender, non distended.  Extremities: no edema, lower extremities warm to touch. Neurologic: Non Focal.   Skin: No Rash.   Wounds: N/A.    Intake/Output from previous day: No intake or output data in the 24 hours ending 09/06/12 1026   LAB RESULTS: CBC  Recent Labs Lab 09/01/12 1838 09/02/12 0500 09/05/12 0810 09/06/12 0535  WBC 14.8*  10.6* 10.5 8.4  HGB 10.5* 8.8* 11.4* 10.1*  HCT 34.4* 28.4* 35.4* 32.2*  PLT 283 259 350 291  MCV 58.0* 57.6* 58.4* 57.9*  MCH 17.7* 17.8* 18.8* 18.2*  MCHC 30.5 31.0 32.2 31.4  RDW 20.7* 20.5* 20.7* 20.6*  LYMPHSABS 1.3 1.8 1.3  --   MONOABS 0.6 0.6 0.5  --   EOSABS 0.1 0.1 0.3  --   BASOSABS 0.0 0.0 0.1  --     Chemistries   Recent Labs Lab 09/01/12 1838 09/02/12 0500 09/05/12 0810 09/06/12 0535  NA 136 138 138 136  K 3.9 3.5 3.7 3.3*  CL 101 104 102 102  CO2 19 22 23 21   GLUCOSE 189* 116* 70 81  BUN 12 9 10 9   CREATININE 0.52 0.48* 0.64 0.58  CALCIUM 9.4 8.2* 10.1 9.1    CBG:  Recent Labs Lab 09/05/12 2116 09/05/12 2139 09/06/12 0003 09/06/12 0436 09/06/12 0752  GLUCAP 59* 78 96 85 77    GFR Estimated Creatinine Clearance: 108.2 ml/min (by C-G formula based on Cr of 0.58).  Coagulation profile No results found for this basename: INR, PROTIME,  in the last 168 hours  Cardiac Enzymes No results found for this basename: CK, CKMB, TROPONINI, MYOGLOBIN,  in the last 168 hours  No components found with this basename: POCBNP,  No results found for this basename: DDIMER,  in the last 72 hours No results found for this basename: HGBA1C,  in  the last 72 hours No results found for this basename: CHOL, HDL, LDLCALC, TRIG, CHOLHDL, LDLDIRECT,  in the last 72 hours No results found for this basename: TSH, T4TOTAL, FREET3, T3FREE, THYROIDAB,  in the last 72 hours No results found for this basename: VITAMINB12, FOLATE, FERRITIN, TIBC, IRON, RETICCTPCT,  in the last 72 hours  Recent Labs  09/05/12 0810  LIPASE 96*    Urine Studies No results found for this basename: UACOL, UAPR, USPG, UPH, UTP, UGL, UKET, UBIL, UHGB, UNIT, UROB, ULEU, UEPI, UWBC, URBC, UBAC, CAST, CRYS, UCOM, BILUA,  in the last 72 hours  MICROBIOLOGY: No results found for this or any previous visit (from the past 240 hour(s)).  RADIOLOGY STUDIES/RESULTS: Ct Abdomen Pelvis W  Contrast  09/02/2012  **ADDENDUM** CREATED: 09/02/2012 03:07:48  Soft tissue attenuation along the left anterior abdominal wall rectus musculature (image 61 of series 2), not significantly changed from 2007, presumably scar tissue.  **END ADDENDUM** SIGNED BY: Waneta Martins, M.D.   09/02/2012  *RADIOLOGY REPORT*  Clinical Data: Abdominal pain, elevated lipase  CT ABDOMEN AND PELVIS WITH CONTRAST  Technique:  Multidetector CT imaging of the abdomen and pelvis was performed following the standard protocol during bolus administration of intravenous contrast.  Contrast: OMNIPAQUE IOHEXOL 300 MG/ML  SOLN  Comparison: 03/23/2008  Findings: Limited images through the lung bases demonstrate no significant appreciable abnormality. The heart size is within normal limits. No pleural or pericardial effusion.  Unremarkable liver, spleen, adrenal glands.  Absent gallbladder.  No biliary ductal dilatation.  There is respiratory motion at the level of the pancreas.  Allowing for this, there is a nonspecific hypodense area along the pancreaticoduodenal groove as seen on series 2 image 41. Otherwise, homogeneous pancreatic enhancement.  No ductal dilatation. Questionable locule of gas along the pancreatic duct on image 39 however appears to be cystic or fat density on the delayed phase.  Nonobstructing right renal stones.  No hydronephrosis or hydroureter.  No CT evidence for colitis.  Normal appendix.  Small bowel loops are normal course and caliber.  There is a locule of gas which extends along the anti dependent margin of the duodenum on series 2 image 42.  This may be intraluminal or reflect a small ulcer.  No peri duodenal fat stranding.  No free intraperitoneal air or fluid.  No lymphadenopathy.  Normal caliber aorta and branch vessels.  Thin-walled bladder.  Nonspecific appearance to the uterus, retroverted.  Left corpus luteal cyst.  No acute osseous finding.  IMPRESSION: The images through the level of the  pancreas are degraded by motion.  There is mild hypodensity along the pancreaticoduodenal groove which may reflect volume averaging, groove pancreatitis, or duodenitis/ulcer disease. Consider EGD correlation and a non emergent MRI follow-up to exclude an underlying lesion.  Original Report Authenticated By: Jearld Lesch, M.D.    Dg Chest Port 1 View  08/12/2012  *RADIOLOGY REPORT*  Clinical Data: Sickle cell, chest pain  PORTABLE CHEST - 1 VIEW  Comparison: 07/16/2012  Findings: Central peribronchial cuffing and mild interstitial prominence.  No confluent airspace opacity.  Heart size upper normal.  Elevated hemidiaphragms.  No pneumothorax or pleural effusion.  No acute osseous finding.  Surgical clips right upper quadrant.  IMPRESSION: Peribronchial cuffing and mild interstitial prominence may reflect bronchitis or sequelae of sickle cell.  No confluent airspace opacity.   Original Report Authenticated By: Jearld Lesch, M.D.     MEDICATIONS: Scheduled Meds: . fenofibrate  54 mg Oral Daily  .  insulin aspart  0-9 Units Subcutaneous Q4H  . insulin glargine  20 Units Subcutaneous QHS  . metoCLOPramide (REGLAN) injection  5 mg Intravenous TID AC  . pantoprazole (PROTONIX) IV  40 mg Intravenous Q12H   Continuous Infusions: . dextrose 5 % and 0.9% NaCl 75 mL/hr at 09/05/12 2242   PRN Meds:.HYDROmorphone (DILAUDID) injection, oxyCODONE, oxyCODONE, oxyCODONE-acetaminophen, oxyCODONE-acetaminophen, promethazine  Antibiotics: Anti-infectives   None       Jeoffrey Massed, MD  [facility] Pager:[phone]  If 7PM-7AM, please contact night-coverage [email or web address] Password [email or web address] 09/06/2012, 10:26 AM   LOS: 1 day

## 2012-09-07 ENCOUNTER — Encounter (HOSPITAL_COMMUNITY)
Admission: EM | Disposition: A | Discharge status: Home / Self Care | Payer: Self-pay | Source: Home / Self Care | Attending: Internal Medicine

## 2012-09-07 HISTORY — PX: ESOPHAGOGASTRODUODENOSCOPY: SHX5428

## 2012-09-07 LAB — GLUCOSE, CAPILLARY
Glucose-Capillary: 170 mg/dL — ABNORMAL HIGH (ref 70–99)
Glucose-Capillary: 193 mg/dL — ABNORMAL HIGH (ref 70–99)
Glucose-Capillary: 211 mg/dL — ABNORMAL HIGH (ref 70–99)
Glucose-Capillary: 234 mg/dL — ABNORMAL HIGH (ref 70–99)

## 2012-09-07 LAB — BASIC METABOLIC PANEL
BUN: 8 mg/dL (ref 6–23)
Chloride: 102 mEq/L (ref 96–112)
Glucose, Bld: 153 mg/dL — ABNORMAL HIGH (ref 70–99)
Potassium: 4.3 mEq/L (ref 3.5–5.1)

## 2012-09-07 SURGERY — EGD (ESOPHAGOGASTRODUODENOSCOPY)
Anesthesia: Moderate Sedation

## 2012-09-07 MED ORDER — FENTANYL CITRATE 0.05 MG/ML IJ SOLN
INTRAMUSCULAR | Status: AC
  Filled 2012-09-07: qty 4

## 2012-09-07 MED ORDER — LIDOCAINE VISCOUS 2 % MT SOLN
OROMUCOSAL | Status: DC | PRN
  Administered 2012-09-07: 20 mL via OROMUCOSAL

## 2012-09-07 MED ORDER — METOCLOPRAMIDE HCL 5 MG/ML IJ SOLN
5.0000 mg | Freq: Once | INTRAMUSCULAR | Status: AC
  Administered 2012-09-07: 5 mg via INTRAVENOUS
  Filled 2012-09-07: qty 1

## 2012-09-07 MED ORDER — POTASSIUM CHLORIDE CRYS ER 20 MEQ PO TBCR
40.0000 meq | EXTENDED_RELEASE_TABLET | Freq: Once | ORAL | Status: AC
  Administered 2012-09-07: 40 meq via ORAL
  Filled 2012-09-07: qty 2

## 2012-09-07 MED ORDER — HYDROMORPHONE HCL PF 1 MG/ML IJ SOLN
0.5000 mg | Freq: Once | INTRAMUSCULAR | Status: AC
  Administered 2012-09-07: 0.5 mg via INTRAVENOUS
  Filled 2012-09-07: qty 1

## 2012-09-07 MED ORDER — FENTANYL CITRATE 0.05 MG/ML IJ SOLN
INTRAMUSCULAR | Status: DC | PRN
  Administered 2012-09-07 (×3): 25 ug via INTRAVENOUS

## 2012-09-07 MED ORDER — POLYETHYLENE GLYCOL 3350 17 G PO PACK
17.0000 g | PACK | Freq: Two times a day (BID) | ORAL | Status: DC
  Administered 2012-09-08: 17 g via ORAL
  Filled 2012-09-07 (×3): qty 1

## 2012-09-07 MED ORDER — SODIUM CHLORIDE 0.9 % IV SOLN
INTRAVENOUS | Status: DC
  Administered 2012-09-07: 18:00:00 via INTRAVENOUS

## 2012-09-07 MED ORDER — MIDAZOLAM HCL 10 MG/2ML IJ SOLN
INTRAMUSCULAR | Status: DC | PRN
  Administered 2012-09-07 (×3): 2 mg via INTRAVENOUS

## 2012-09-07 MED ORDER — MIDAZOLAM HCL 5 MG/ML IJ SOLN
INTRAMUSCULAR | Status: AC
  Filled 2012-09-07: qty 2

## 2012-09-07 MED ORDER — LIDOCAINE VISCOUS 2 % MT SOLN
OROMUCOSAL | Status: AC
  Filled 2012-09-07: qty 15

## 2012-09-07 NOTE — Progress Notes (Signed)
Pt complaining of increased pain and nausea. On call MD notified, a one time dose of dilaudid 0.5mg  was ordered. Will continue to monitor.

## 2012-09-07 NOTE — Progress Notes (Deleted)
Talked to on call MD, diet will not be advanced.

## 2012-09-07 NOTE — Progress Notes (Signed)
Pt c/o nausea @ 1208 Phenergan 25 mg IV given per order. Pt vomiting mod amount yellowish/white liquid @ 1240. Pt request pain medication early for 10/10 abd pain. RN informed pt med needs to be at scheduled prn time.

## 2012-09-07 NOTE — Progress Notes (Signed)
PATIENT DETAILS Name: [name] Age: 37 y.o. Sex: female Date of Birth: 05-27-1976 Admit Date: 09/05/2012 Admitting Physician Alison Murray, MD [id number], Provider, MD  Subjective: Still complaining of abdominal pain, 2 episodes of vomiting this am  Assessment/Plan: Principal Problem: Abdominal Pain -lipase mildly elevated-not sure whether this is pancreatitis or Duodenitis -c/w PPI -pain seems out of proportion to physical findings-claims never had a EGD in the past-therefore will ask GI to see for EGD-particularly given recent CT Scan abd findings -Unfortunately-patient has had narcotic seeking behavior in the past, has claimed to have Sickle cell disease when she does not- very difficult to evaluate whether this abdominal pain is really from acute medical issues-therefore-EGD today -Belly continues to very soft -supportive care -if there is no significant abnormalities on EGD-diet will be again slowly advanced-and she will be discharged  Active Problems:  DM with hypoglycemia -hypoglycemia resolved -decrease Lantus to 20 units -cautiously continue with SSI for now  Anemia -does not have Sickle cell anemia -monitor Hb periodically  Chronic Pain syndrome -has h/o chronic abd pain/back pain -reviewed outpatient records from urgent care-has had numerous visits for "meds refill"  Disposition: Remain inpatient  DVT Prophylaxis: Prophylactic Lovenox  Code Status: Full code  Procedures:  None  CONSULTS:  None  PHYSICAL EXAM: Vital signs in last 24 hours: Filed Vitals:   09/06/12 0439 09/06/12 1412 09/06/12 2144 09/07/12 0351  BP: 109/69 101/62 153/73 159/94  Pulse: 87 102 87 91  Temp: 98.6 F (37 C) 98.3 F (36.8 C) 98.7 F (37.1 C) 99.1 F (37.3 C)  TempSrc: Oral Oral Oral Oral  Resp: 18 18 16 18   Height:      Weight:      SpO2: 98% 99% 100% 100%    Weight change:  Body mass index is 29.55 kg/(m^2).   Gen Exam: Awake and alert with clear  speech.   Neck: Supple, No JVD.   Chest: B/L Clear.  No rales or rhonchi CVS: S1 S2 Regular, no murmurs.  Abdomen: soft, BS +, non tender, non distended.  Extremities: no edema, lower extremities warm to touch. Neurologic: Non Focal.   Skin: No Rash.   Wounds: N/A.    Intake/Output from previous day:  Intake/Output Summary (Last 24 hours) at 09/07/12 1017 Last data filed at 09/07/12 0500  Gross per 24 hour  Intake 3570.58 ml  Output      0 ml  Net 3570.58 ml     LAB RESULTS: CBC  Recent Labs Lab 09/01/12 1838 09/02/12 0500 09/05/12 0810 09/06/12 0535  WBC 14.8* 10.6* 10.5 8.4  HGB 10.5* 8.8* 11.4* 10.1*  HCT 34.4* 28.4* 35.4* 32.2*  PLT 283 259 350 291  MCV 58.0* 57.6* 58.4* 57.9*  MCH 17.7* 17.8* 18.8* 18.2*  MCHC 30.5 31.0 32.2 31.4  RDW 20.7* 20.5* 20.7* 20.6*  LYMPHSABS 1.3 1.8 1.3  --   MONOABS 0.6 0.6 0.5  --   EOSABS 0.1 0.1 0.3  --   BASOSABS 0.0 0.0 0.1  --     Chemistries   Recent Labs Lab 09/01/12 1838 09/02/12 0500 09/05/12 0810 09/06/12 0535 09/07/12 0530  NA 136 138 138 136 136  K 3.9 3.5 3.7 3.3* 4.3  CL 101 104 102 102 102  CO2 19 22 23 21 21   GLUCOSE 189* 116* 70 81 153*  BUN 12 9 10 9 8   CREATININE 0.52 0.48* 0.64 0.58 0.53  CALCIUM 9.4 8.2* 10.1 9.1 9.4    CBG:  Recent  Labs Lab 09/06/12 1711 09/06/12 2029 09/07/12 0035 09/07/12 0340 09/07/12 0746  GLUCAP 110* 121* 170* 157* 211*    GFR Estimated Creatinine Clearance: 108.2 ml/min (by C-G formula based on Cr of 0.53).  Coagulation profile No results found for this basename: INR, PROTIME,  in the last 168 hours  Cardiac Enzymes No results found for this basename: CK, CKMB, TROPONINI, MYOGLOBIN,  in the last 168 hours  No components found with this basename: POCBNP,  No results found for this basename: DDIMER,  in the last 72 hours No results found for this basename: HGBA1C,  in the last 72 hours No results found for this basename: CHOL, HDL, LDLCALC, TRIG,  CHOLHDL, LDLDIRECT,  in the last 72 hours No results found for this basename: TSH, T4TOTAL, FREET3, T3FREE, THYROIDAB,  in the last 72 hours No results found for this basename: VITAMINB12, FOLATE, FERRITIN, TIBC, IRON, RETICCTPCT,  in the last 72 hours  Recent Labs  09/05/12 0810 09/07/12 0530  LIPASE 96* 113*    Urine Studies No results found for this basename: UACOL, UAPR, USPG, UPH, UTP, UGL, UKET, UBIL, UHGB, UNIT, UROB, ULEU, UEPI, UWBC, URBC, UBAC, CAST, CRYS, UCOM, BILUA,  in the last 72 hours  MICROBIOLOGY: No results found for this or any previous visit (from the past 240 hour(s)).  RADIOLOGY STUDIES/RESULTS: Ct Abdomen Pelvis W Contrast  09/02/2012  **ADDENDUM** CREATED: 09/02/2012 03:07:48  Soft tissue attenuation along the left anterior abdominal wall rectus musculature (image 61 of series 2), not significantly changed from 2007, presumably scar tissue.  **END ADDENDUM** SIGNED BY: Waneta Martins, M.D.   09/02/2012  *RADIOLOGY REPORT*  Clinical Data: Abdominal pain, elevated lipase  CT ABDOMEN AND PELVIS WITH CONTRAST  Technique:  Multidetector CT imaging of the abdomen and pelvis was performed following the standard protocol during bolus administration of intravenous contrast.  Contrast: OMNIPAQUE IOHEXOL 300 MG/ML  SOLN  Comparison: 03/23/2008  Findings: Limited images through the lung bases demonstrate no significant appreciable abnormality. The heart size is within normal limits. No pleural or pericardial effusion.  Unremarkable liver, spleen, adrenal glands.  Absent gallbladder.  No biliary ductal dilatation.  There is respiratory motion at the level of the pancreas.  Allowing for this, there is a nonspecific hypodense area along the pancreaticoduodenal groove as seen on series 2 image 41. Otherwise, homogeneous pancreatic enhancement.  No ductal dilatation. Questionable locule of gas along the pancreatic duct on image 39 however appears to be cystic or fat density on  the delayed phase.  Nonobstructing right renal stones.  No hydronephrosis or hydroureter.  No CT evidence for colitis.  Normal appendix.  Small bowel loops are normal course and caliber.  There is a locule of gas which extends along the anti dependent margin of the duodenum on series 2 image 42.  This may be intraluminal or reflect a small ulcer.  No peri duodenal fat stranding.  No free intraperitoneal air or fluid.  No lymphadenopathy.  Normal caliber aorta and branch vessels.  Thin-walled bladder.  Nonspecific appearance to the uterus, retroverted.  Left corpus luteal cyst.  No acute osseous finding.  IMPRESSION: The images through the level of the pancreas are degraded by motion.  There is mild hypodensity along the pancreaticoduodenal groove which may reflect volume averaging, groove pancreatitis, or duodenitis/ulcer disease. Consider EGD correlation and a non emergent MRI follow-up to exclude an underlying lesion.  Original Report Authenticated By: Jearld Lesch, M.D.    Dg Chest Port 1  View  08/12/2012  *RADIOLOGY REPORT*  Clinical Data: Sickle cell, chest pain  PORTABLE CHEST - 1 VIEW  Comparison: 07/16/2012  Findings: Central peribronchial cuffing and mild interstitial prominence.  No confluent airspace opacity.  Heart size upper normal.  Elevated hemidiaphragms.  No pneumothorax or pleural effusion.  No acute osseous finding.  Surgical clips right upper quadrant.  IMPRESSION: Peribronchial cuffing and mild interstitial prominence may reflect bronchitis or sequelae of sickle cell.  No confluent airspace opacity.   Original Report Authenticated By: Jearld Lesch, M.D.     MEDICATIONS: Scheduled Meds: . enoxaparin (LOVENOX) injection  40 mg Subcutaneous Q24H  . fenofibrate  54 mg Oral Daily  . insulin aspart  0-9 Units Subcutaneous Q4H  . insulin glargine  20 Units Subcutaneous QHS  . metoCLOPramide (REGLAN) injection  5 mg Intravenous TID AC  . pantoprazole (PROTONIX) IV  40 mg Intravenous  Q12H   Continuous Infusions: . dextrose 5 % and 0.9% NaCl 800 mL (09/07/12 0921)   PRN Meds:.HYDROmorphone (DILAUDID) injection, oxyCODONE-acetaminophen, promethazine  Antibiotics: Anti-infectives   None       Jeoffrey Massed, MD  [facility] Pager:[phone]  If 7PM-7AM, please contact night-coverage [email or web address] Password [facility] 09/07/2012, 10:17 AM   LOS: 2 days

## 2012-09-07 NOTE — Consult Note (Signed)
Cross cover [facility]-unassigned pt.  Reason for Consult: Epigastric pain-needs an EGD. Referring Physician: [facility]-Dr. Ria Comment is an 37 y.o. female.  HPI: Patient admitted with pancreatitis and minimal Lipase elevation requiring narcotics around the clock, found to have some inflammatory changes around the duodenum on her recent CT scan ?groove pancreatitis vs duodenitis. Patient gives a 2 month history of abdominal pain with nausea and vomiting. Has occasional problems with constipation but denies having any melena or hematochezia. There is no history of dysphagia, odynophagia, jaundice or colitis. She has had reflux for several years.  Past Medical History  Diagnosis Date  . Diabetes mellitus   .    Marland Kitchen Sickle cell disease   . Headache   . Seasonal allergies   . Abnormal Pap smear   .    Marland Kitchen Gout    Past Surgical History  Procedure Laterality Date  . Cholecystectomy    . Cesarean section     Family History  Problem Relation Age of Onset  . Hypertension Mother   . Diabetes Mother   . Hypertension Maternal Aunt   . Diabetes Maternal Aunt   . Hypertension Maternal Uncle   . Diabetes Maternal Uncle   . Hypertension Maternal Grandmother    Social History:  reports that she has been smoking Cigarettes.  She has been smoking about 0.50 packs per day. She has never used smokeless tobacco. She reports that she does not drink alcohol or use illicit drugs.  Allergies:  Allergies  Allergen Reactions  . Ciprofloxacin     REACTION: Rash  . Morphine And Related Hives  . Zofran (Ondansetron Hcl) Hives and Nausea And Vomiting   Medications: I have reviewed the patient's current medications.  Results for orders placed during the hospital encounter of 09/05/12 (from the past 48 hour(s))  URINALYSIS, ROUTINE W REFLEX MICROSCOPIC     Status: Abnormal   Collection Time    09/05/12  9:49 AM      Result Value Range   Color, Urine YELLOW  YELLOW   APPearance CLOUDY (*) CLEAR   Specific Gravity, Urine 1.017  1.005 - 1.030   pH 7.0  5.0 - 8.0   Glucose, UA NEGATIVE  NEGATIVE mg/dL   Hgb urine dipstick NEGATIVE  NEGATIVE   Bilirubin Urine NEGATIVE  NEGATIVE   Ketones, ur NEGATIVE  NEGATIVE mg/dL   Protein, ur NEGATIVE  NEGATIVE mg/dL   Urobilinogen, UA 0.2  0.0 - 1.0 mg/dL   Nitrite NEGATIVE  NEGATIVE   Leukocytes, UA TRACE (*) NEGATIVE  URINE MICROSCOPIC-ADD ON     Status: Abnormal   Collection Time    09/05/12  9:49 AM      Result Value Range   Squamous Epithelial / LPF MANY (*) RARE   WBC, UA 0-2  <3 WBC/hpf   RBC / HPF 0-2  <3 RBC/hpf   Bacteria, UA FEW (*) RARE   Urine-Other FEW YEAST    GLUCOSE, CAPILLARY     Status: Abnormal   Collection Time    09/05/12  5:25 PM      Result Value Range   Glucose-Capillary 62 (*) 70 - 99 mg/dL  GLUCOSE, CAPILLARY     Status: None   Collection Time    09/05/12  6:33 PM      Result Value Range   Glucose-Capillary 97  70 - 99 mg/dL  GLUCOSE, CAPILLARY     Status: Abnormal   Collection Time    09/05/12  9:16 PM  Result Value Range   Glucose-Capillary 59 (*) 70 - 99 mg/dL  GLUCOSE, CAPILLARY     Status: None   Collection Time    09/05/12  9:39 PM      Result Value Range   Glucose-Capillary 78  70 - 99 mg/dL  GLUCOSE, CAPILLARY     Status: None   Collection Time    09/06/12 12:03 AM      Result Value Range   Glucose-Capillary 96  70 - 99 mg/dL  GLUCOSE, CAPILLARY     Status: None   Collection Time    09/06/12  4:36 AM      Result Value Range   Glucose-Capillary 85  70 - 99 mg/dL  COMPREHENSIVE METABOLIC PANEL     Status: Abnormal   Collection Time    09/06/12  5:35 AM      Result Value Range   Sodium 136  135 - 145 mEq/L   Potassium 3.3 (*) 3.5 - 5.1 mEq/L   Chloride 102  96 - 112 mEq/L   CO2 21  19 - 32 mEq/L   Glucose, Bld 81  70 - 99 mg/dL   BUN 9  6 - 23 mg/dL   Creatinine, Ser 7.82  0.50 - 1.10 mg/dL   Calcium 9.1  8.4 - 95.6 mg/dL   Total Protein 7.7  6.0 - 8.3 g/dL   Albumin 3.6  3.5 -  5.2 g/dL   AST 18  0 - 37 U/L   ALT 10  0 - 35 U/L   Alkaline Phosphatase 54  39 - 117 U/L   Total Bilirubin 0.4  0.3 - 1.2 mg/dL   GFR calc non Af Amer >90  >90 mL/min   GFR calc Af Amer >90  >90 mL/min   Comment:            The eGFR has been calculated     using the CKD EPI equation.     This calculation has not been     validated in all clinical     situations.     eGFR's persistently     <90 mL/min signify     possible Chronic Kidney Disease.  CBC     Status: Abnormal   Collection Time    09/06/12  5:35 AM      Result Value Range   WBC 8.4  4.0 - 10.5 K/uL   RBC 5.56 (*) 3.87 - 5.11 MIL/uL   Hemoglobin 10.1 (*) 12.0 - 15.0 g/dL   HCT 21.3 (*) 08.6 - 57.8 %   MCV 57.9 (*) 78.0 - 100.0 fL   MCH 18.2 (*) 26.0 - 34.0 pg   MCHC 31.4  30.0 - 36.0 g/dL   RDW 46.9 (*) 62.9 - 52.8 %   Platelets 291  150 - 400 K/uL  GLUCOSE, CAPILLARY     Status: None   Collection Time    09/06/12  7:52 AM      Result Value Range   Glucose-Capillary 77  70 - 99 mg/dL  GLUCOSE, CAPILLARY     Status: None   Collection Time    09/06/12 12:10 PM      Result Value Range   Glucose-Capillary 99  70 - 99 mg/dL  GLUCOSE, CAPILLARY     Status: Abnormal   Collection Time    09/06/12  5:11 PM      Result Value Range   Glucose-Capillary 110 (*) 70 - 99 mg/dL  GLUCOSE, CAPILLARY  Status: Abnormal   Collection Time    09/06/12  8:29 PM      Result Value Range   Glucose-Capillary 121 (*) 70 - 99 mg/dL  GLUCOSE, CAPILLARY     Status: Abnormal   Collection Time    09/07/12 12:35 AM      Result Value Range   Glucose-Capillary 170 (*) 70 - 99 mg/dL  GLUCOSE, CAPILLARY     Status: Abnormal   Collection Time    09/07/12  3:40 AM      Result Value Range   Glucose-Capillary 157 (*) 70 - 99 mg/dL  BASIC METABOLIC PANEL     Status: Abnormal   Collection Time    09/07/12  5:30 AM      Result Value Range   Sodium 136  135 - 145 mEq/L   Potassium 4.3  3.5 - 5.1 mEq/L   Chloride 102  96 - 112 mEq/L    CO2 21  19 - 32 mEq/L   Glucose, Bld 153 (*) 70 - 99 mg/dL   BUN 8  6 - 23 mg/dL   Creatinine, Ser 1.61  0.50 - 1.10 mg/dL   Calcium 9.4  8.4 - 09.6 mg/dL   GFR calc non Af Amer >90  >90 mL/min   GFR calc Af Amer >90  >90 mL/min   Comment:            The eGFR has been calculated     using the CKD EPI equation.     This calculation has not been     validated in all clinical     situations.     eGFR's persistently     <90 mL/min signify     possible Chronic Kidney Disease.  LIPASE, BLOOD     Status: Abnormal   Collection Time    09/07/12  5:30 AM      Result Value Range   Lipase 113 (*) 11 - 59 U/L  GLUCOSE, CAPILLARY     Status: Abnormal   Collection Time    09/07/12  7:46 AM      Result Value Range   Glucose-Capillary 211 (*) 70 - 99 mg/dL   No results found.  Review of Systems  Constitutional: Negative for fever, chills, weight loss, malaise/fatigue and diaphoresis.  HENT: Positive for neck pain.   Gastrointestinal: Positive for heartburn, nausea, vomiting, abdominal pain and constipation. Negative for blood in stool and melena.  Genitourinary: Negative.   Musculoskeletal: Positive for myalgias, back pain and joint pain.  Skin: Negative.   Neurological: Negative.  Negative for weakness.  Endo/Heme/Allergies: Negative.   Psychiatric/Behavioral: The patient is nervous/anxious.    Blood pressure 159/94, pulse 91, temperature 99.1 F (37.3 C), temperature source Oral, resp. rate 18, height 5\' 7"  (1.702 m), weight 85.6 kg (188 lb 11.4 oz), last menstrual period 08/15/2012, SpO2 100.00%. Physical Exam  Constitutional: She is oriented to person, place, and time. She appears well-developed and well-nourished.  HENT:  Head: Normocephalic and atraumatic.  Eyes: Conjunctivae and EOM are normal.  Neck: Normal range of motion. Neck supple.  Cardiovascular: Normal rate and regular rhythm.   Respiratory: Effort normal and breath sounds normal.  GI: Soft. Bowel sounds are normal.  There is no hepatosplenomegaly. There is tenderness in the epigastric area and periumbilical area. There is no rebound and no CVA tenderness.  Musculoskeletal: Normal range of motion.  Neurological: She is alert and oriented to person, place, and time.  Skin: Skin is warm and dry.  Psychiatric: She has a normal mood and affect. Her behavior is normal. Judgment and thought content normal.   Assessment/Plan: 1) Epigastric and periumbilical pain/GERD/ Abnormal CT scan ? Pancreatitis vs duodenitis: Etiology unclear. Will proceed with an EGD today.   [name] 09/07/2012, 9:39 AM

## 2012-09-07 NOTE — Progress Notes (Signed)
Patient complaining of severe pain 9/10.  She is requesting Dilaudid to be changed to Q3 hours.  She does not want to take the Percocet due to her nausea.  RN previously called about changing the nausea meds, however, patient reporting that she actually wanted her pain meds changed instead.  Benedetto Coons notified of patient's request.

## 2012-09-07 NOTE — Progress Notes (Signed)
Pt is refusing to get her blood sugar checked. She was asked twice by the nurse tech and refused both times.   Dieter Hane E

## 2012-09-07 NOTE — Progress Notes (Signed)
Pt is asking for an ensure and cereal. RN made pt aware that she is on a clear liquid diet. Pt would like diet to be advanced. Will contact on call MD

## 2012-09-07 NOTE — Progress Notes (Signed)
0754 Pt c/o n/v medicated per order. Pt c/o pain, medication not due until 0843 pt aware. Pt aware she is to be npo for EDG. Pt called RN into room at approx 0900 d/t vomiting in basin, pt vomit was white liquid and smelled like ensure/gulcerna RN asked pt when the last time she ate or drank anything and pt response was that she was given/drank glucerna "around 3:00 a.m.". RN also note during morning bedside reporting that glucerna can was unopened in bed with pt, at 0900 the can was open at bedside with straw in can. Pt remind that she needed to be nothing to eat or drink for EDG.

## 2012-09-07 NOTE — Op Note (Signed)
[facility] [address] [place], [address]   OPERATIVE PROCEDURE REPORT  PATIENT :[name], [name]  MR#: [id number] BIRTHDATE :1976/05/18 GENDER: Female ENDOSCOPIST:  Lorenza Burton, MD ASSISTANT:   Jeneen Montgomery, RN & Windell Hummingbird, technician PROCEDURE DATE: 09-29-2012 PRE-PROCEDURE PREPERATION: Patient fasted for 4 hours prior to procedure. PRE-PROCEDURE PHYSICAL: Patient has stable vital signs.  Neck is supple.  There is no JVD, thyromegaly or LAD.  Chest clear to auscultation.  S1 and S2 regular.  Abdomen soft, obese with epigastric and periumbilical tenderness on palpation with gauridng but without rebound or rigidity; the abdomen is non-distended, non-tender with NABS. PROCEDURE:     EGD, diagnostic ASA CLASS:     Class III INDICATIONS:     Epigastric and periumbilical pain and acid reflux.  MEDICATIONS:     Versed 6 mg & and Fentanyl 75 mcg IV . TOPICAL ANESTHETIC:   Viscous xylocaine-15 cc PO.  DESCRIPTION OF PROCEDURE: After the risks benefits and alternatives of the procedure were thoroughly explained, informed consent was obtained.  The Pentax Gastroscope [id number]  was introduced through the mouth and advanced to the second portion of the duodenum , without limitations. The instrument was slowly withdrawn as the mucosa was fully examined.   The esophagus, stomach and the proximal small bowel appeared normal. There were no ulcers, erosions, masses or polyps noted. Retroflexed views revealed a small hiatal hernia,. The scope was then withdrawn from the patient and the procedure terminated. The patient tolerated the procedure without immediate complications.  IMPRESSION:  Small hiatal hernia; otherwise, normal esophagogastroduodenoscopy.  RECOMMENDATIONS:     Continue current medications  REPEAT EXAM:  for No recall planned for now.  DISCHARGE INSTRUCTIONS: Standard discharge _______________________________ eSigned:  Dr. Lorenza Burton, MD September 29, 2012 2:35 PM   CPT CODES:     43235, EGD  DIAGNOSIS CODES:     789.06, 789.05, 530.81, 553.3   CC: [facility]

## 2012-09-08 ENCOUNTER — Encounter (HOSPITAL_COMMUNITY): Payer: Self-pay | Admitting: Gastroenterology

## 2012-09-08 DIAGNOSIS — D649 Anemia, unspecified: Secondary | ICD-10-CM

## 2012-09-08 DIAGNOSIS — R109 Unspecified abdominal pain: Secondary | ICD-10-CM

## 2012-09-08 DIAGNOSIS — R112 Nausea with vomiting, unspecified: Secondary | ICD-10-CM

## 2012-09-08 LAB — COMPREHENSIVE METABOLIC PANEL
ALT: 8 U/L (ref 0–35)
Alkaline Phosphatase: 56 U/L (ref 39–117)
BUN: 11 mg/dL (ref 6–23)
CO2: 24 mEq/L (ref 19–32)
Calcium: 9.4 mg/dL (ref 8.4–10.5)
GFR calc Af Amer: >90 mL/min (ref >90)
GFR calc non Af Amer: >90 mL/min (ref >90)
Glucose, Bld: 159 mg/dL — ABNORMAL HIGH (ref 70–99)
Sodium: 134 mEq/L — ABNORMAL LOW (ref 135–145)

## 2012-09-08 LAB — CBC
HCT: 31.1 % — ABNORMAL LOW (ref 36.0–46.0)
Hemoglobin: 9.4 g/dL — ABNORMAL LOW (ref 12.0–15.0)
MCH: 17.9 pg — ABNORMAL LOW (ref 26.0–34.0)
MCV: 59.4 fL — ABNORMAL LOW (ref 78.0–100.0)
RBC: 5.24 MIL/uL — ABNORMAL HIGH (ref 3.87–5.11)

## 2012-09-08 LAB — GLUCOSE, CAPILLARY

## 2012-09-08 MED ORDER — POLYSACCHARIDE IRON COMPLEX 150 MG PO CAPS: 150.0000 mg | ORAL_CAPSULE | Freq: Every day | ORAL | Status: DC

## 2012-09-08 MED ORDER — OXYCODONE-ACETAMINOPHEN 10-325 MG PO TABS: 0.5000 | ORAL_TABLET | ORAL | Status: DC | PRN

## 2012-09-08 MED ORDER — POLYETHYLENE GLYCOL 3350 17 G PO PACK: 17.0000 g | PACK | Freq: Two times a day (BID) | ORAL | Status: DC

## 2012-09-08 MED ORDER — INSULIN GLARGINE 100 UNIT/ML ~~LOC~~ SOLN: 20.0000 [IU] | Freq: Every day | SUBCUTANEOUS | Status: DC

## 2012-09-08 MED ORDER — INSULIN GLARGINE 100 UNIT/ML ~~LOC~~ SOLN: 35.0000 [IU] | Freq: Every day | SUBCUTANEOUS | Status: DC

## 2012-09-08 MED ORDER — POLYSACCHARIDE IRON COMPLEX 150 MG PO CAPS
150.0000 mg | ORAL_CAPSULE | Freq: Every day | ORAL | Status: DC
  Administered 2012-09-08: 150 mg via ORAL
  Filled 2012-09-08: qty 1

## 2012-09-08 MED ORDER — PANTOPRAZOLE SODIUM 40 MG PO TBEC: 40.0000 mg | DELAYED_RELEASE_TABLET | Freq: Every day | ORAL | Status: DC

## 2012-09-08 NOTE — Progress Notes (Signed)
Patient continuously called out for pain medicine and nausea medicine several times every hour, but still wanted her full carb diet.  She had one episode of vomiting throughout the night shift.  Claiborne Billings, NP on call for triad was notified several times of patient's complaints and orders given and fulfilled.  March 14th and March 15th the patient was told that guests were limited to two visitors at a time and only one family member over 84 years of age could stay with her overnight.  On the 15th, she told me her son, whom in his twenties was coming to pick up her other children, the eldest in the room, she stated was 83 years old.  The son never came.

## 2012-09-08 NOTE — Progress Notes (Signed)
Talked to T. Claiborne Billings, diet will not be advanced.

## 2012-09-08 NOTE — Progress Notes (Signed)
Pt is complaining of nausea but she continues to ask for chicken broth. Pt has not had an episode of emesis.

## 2012-09-08 NOTE — Progress Notes (Signed)
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   She nausea is better, tolerating small amount of fulls.  Still with abd pain and tenderness, diffuse.  Stool formed.  Used 6 mg Dilaudid 3/16, 2 mg today.   Used tw doses of 5 mg Percocet so far today.  2 doses of Phenergen today, 5 yesterday.   Tells me she will be getting a primary MD at [facility] where she has been followed by various specialists in past.    OBJECTIVE:         Vital signs in last 24 hours:    Temp:  [98.1 F (36.7 C)-99.8 F (37.7 C)] 98.1 F (36.7 C) (03/17 0508) Pulse Rate:  [90-112] 90 (03/17 0508) Resp:  [6-25] 17 (03/17 0508) BP: (98-150)/(56-95) 98/65 mmHg (03/17 0508) SpO2:  [97 %-100 %] 98 % (03/17 0508) Last BM Date: 09/07/12 General: obese   Looks well   Heart:  RRR Chest: clear B Abdomen: soft, active BS.  Tender to light touch, no mass.  No guard or rebound  Extremities: no CCE Neuro/Psych:  Cooperative, alert, not somnolent.   Intake/Output from previous day: 03/16 0701 - 03/17 0700 In: 480 [P.O.:480] Out: -   Intake/Output this shift:    Lab Results:  Recent Labs  09/06/12 0535 09/08/12 0628  WBC 8.4 7.4  HGB 10.1* 9.4*  HCT 32.2* 31.1*  PLT 291 248  MCV   59  BMET  Recent Labs  09/06/12 0535 09/07/12 0530 09/08/12 0628  NA 136 136 134*  K 3.3* 4.3 4.8  CL 102 102 101  CO2 21 21 24   GLUCOSE 81 153* 159*  BUN 9 8 11   CREATININE 0.58 0.53 0.70  CALCIUM 9.1 9.4 9.4   LFT  Recent Labs  09/06/12 0535 09/08/12 0628  PROT 7.7 8.0  ALBUMIN 3.6 3.7  AST 18 13  ALT 10 8  ALKPHOS 54 56  BILITOT 0.4 0.4   Studies/Results: 09/02/12  CT abdomen/pelvis with contrast IMPRESSION:  The images through the level of the pancreas are  degraded by  motion. There is mild hypodensity along the pancreaticoduodenal  groove which may reflect volume averaging, groove pancreatitis, or  duodenitis/ulcer disease. Consider EGD correlation and a non  emergent MRI follow-up to exclude an underlying lesion.   ASSESMENT: *  Acute pancreatitis??  Range Lipase of 52 to 113.  LFTs normal. 2 months of worsening n/v/abd pain. Hx of chronic abd pain dating back to at least 2006 *  Lap chole 2002  *  EGD 09/07/12:  Small HH otherwise normal.  Seen by Wandra Feinstein and Dr Luther Parody in past.  *  "Gastroparesis" hx.  Hx chronic n/v dating back many years.  Taking narcotics does not help her sxs. Note that GES done in 09/2004 was normal.  *  Microcytic anemia. Dates back many years. Ferritin 4 in 06/2012.  *  Hx poorly controlled DM.  *  Depression, anxiety.  Suicide attempt/admit to [facility] hospital at age 12. BH stay 2009 *  Chronic  Pain and neuropathy.    PLAN: *  ? Treat the Iron deficiency with parenteral iron or po Iron, suspect she will not tolerate po.  LOS: 3 days   Jennye Moccasin  09/08/2012, 10:01 AM Pager: [phone]

## 2012-09-08 NOTE — Progress Notes (Signed)
Pt complaining of pain at IV site. No redness or swelling noted at site. Heat pack applied. IV team called to assess. Pt stated to IV nurse that it was just a little sore and she refused for it to be relocated. Will continue to monitor.

## 2012-09-08 NOTE — Progress Notes (Signed)
1400 Discharge instructions reviewed with patient verbalize and understands. Skin WNL

## 2012-09-08 NOTE — Progress Notes (Signed)
I agree with the above documentation, including the assessment and plan.  

## 2012-09-08 NOTE — Discharge Summary (Signed)
Physician Discharge Summary  [name] ZOX:[id number] DOB: March 20, 1976 DOA: 09/05/2012  PCP: Default, Provider, MD  Admit date: 09/05/2012 Discharge date: 09/08/2012  Time spent:40 minutes  Recommendations for Outpatient Follow-up:  Decrease opiate medications Follow up with pain clinic as previously schedule(per patient appt on 09/12/12)  Discharge Diagnoses:  Principal Problem:   Acute pancreatitis Active Problems:   DIABETES MELLITUS, TYPE II   ANEMIA-NOS   Nausea & vomiting   Abdominal pain   Discharge Condition: stable tolerating soft solid diet.  Diet recommendation: soft low fiber diet until pain resolved.  Filed Weights   09/05/12 1900  Weight: 85.6 kg (188 lb 11.4 oz)    History of present illness:  37 year old female with past medical history narcotic abuse, anemia (no history of sickle cell disease), DM, recently discharged from [name] 09[facility] (hospitalized for generalized pain that she claimed was due to sickle cell) and now comes to [name] with complaints of worsening abdominal pain in mid abdominal area associated with intractable nausea and vomiting. Patient reported pain is 10/10in intensity, non radiating and it started 1 day prior to this admission. There is no associated fever or chills, no diarrhea or constipation. No chest pain, no shortness of breath and no cough.  In ED, patient was found to have lipase level of 96. She was afebrile and hemodynamically stable. She is admitted for observation to [name] service.   [name] Course:   Abdominal Pain  Abdominal pain improved.  Diet was slowly advanced from NPO to low fiber.  At discharge, she was tolerating soft diet, and requesting multiple snacks.  Reported pain seemed out of proportion to objective findings.  CT abdomen pelvis was essentially unrevealing.  Upper endoscopy was negative other than a small hiatal hernia.  She was placed on PPI therapy.     Abdominal pain is likely worsened by narcotic medication  induced gastroparesis.  Unfortunately [name] has a significant history of narcotic seeking behavior in the past, she has claimed to have Sickle cell disease when she does not.  Reportedly, she has a history of forging prescriptions for opiates.  She made repeated requests for increased opiate medications in the [name].  We have recommended strongly that she decrease her outpatient opiate pain medications.  We did not prescribe and opiate medications for her at discharge as per the history obtained from [name] that she received 90 tablets of percocet on 08/20/12 from an unknown MD-this was supposed to be her 3 week supply. So she should have more than sufficient quantity of Percocet to last until her up coming appointment with the chronic pain clinic.(she has been in the [name] for the last 3 days, and was hospitalized in [name] for 1 day on 3/11)  DM with hypoglycemia  Hgb A1C on 3/11 was 7.5.  She had an episode of hypoglycemia and her Lantus was reduced from 70 units to 20 units inpatient.  Her hypoglycemia resolved.  At discharge we recommend she use 35 units of Lantus qhs and increase her dose as her oral intake increases.  Anemia  Does not have Sickle cell anemia.  Was started on Nu-iron therapy in house and we recommend on-going iron therapy outpatient.  Monitor Hb periodically.  Chronic Pain syndrome  She has h/o chronic abd pain/back pain.  Reviewed outpatient records from urgent care-has had numerous visits for "meds refill".  She was not given an RX for opiate medications at discharge- see above discussion   Procedures:  Upper Endoscopy 3/16.  Consultations:  Gastroenterology  Discharge Exam: Filed Vitals:   09/07/12 1459 09/07/12 1535 09/07/12 2156 09/08/12 0508  BP:  122/80 124/81 98/65  Pulse:  90 112 90  Temp: 98.6 F (37 C) 99.1 F (37.3 C) 99.8 F (37.7 C) 98.1 F (36.7 C)  TempSrc:  Oral Oral Oral  Resp:  18 18 17   Height:      Weight:      SpO2:  99%  98% 98%   Gen Exam: Awake and alert with clear speech.  Sitting on the side of the bed. Neck: Supple, No JVD.  Chest: B/L Clear. No rales or rhonchi.  No accessory muscle use CVS: S1 S2 Regular, no murmurs. No lower extremity edema Abdomen: soft, BS +, non tender, non distended. No masses Extremities: no edema, lower extremities warm to touch.  Neurologic: Non Focal.     Discharge Instructions  Discharge Orders   Future Orders Complete By Expires     Diet general  As directed     Comments:      Eat small meals frequently (5- 6 small meals a day instead of 3 normal meals per day)  Eat a low fiber diet when having pain (no salads or raw vegetables/fruits).  Take miralax once or twice daily in 1/2 cup of liquid to ensure you have at least one bowel movement daily.    Increase activity slowly  As directed         Medication List    STOP taking these medications       hydroxyurea 500 MG capsule  Commonly known as:  HYDREA     ibuprofen 800 MG tablet  Commonly known as:  ADVIL,MOTRIN      TAKE these medications       fenofibrate 48 MG tablet  Commonly known as:  TRICOR  Take 48 mg by mouth daily.     insulin aspart 100 UNIT/ML injection  Commonly known as:  novoLOG  Inject 10-20 Units into the skin See admin instructions. Sliding scale     insulin glargine 100 UNIT/ML injection  Commonly known as:  LANTUS SOLOSTAR  Inject 35 Units into the skin at bedtime. Increase slowly back to previous home dose (70 units) as you return to eating normally.     iron polysaccharides 150 MG capsule  Commonly known as:  NIFEREX  Take 1 capsule (150 mg total) by mouth daily.     oxyCODONE-acetaminophen 10-325 MG per tablet  Commonly known as:  PERCOCET  Take 0.5 tablets by mouth every 4 (four) hours as needed for pain.     pantoprazole 40 MG tablet  Commonly known as:  PROTONIX  Take 1 tablet (40 mg total) by mouth daily at 6 (six) AM.     polyethylene glycol packet  Commonly  known as:  MIRALAX / GLYCOLAX  Take 17 g by mouth 2 (two) times daily.           Follow-up Information   Follow up with Default, Provider, MD. Schedule an appointment as soon as possible for a visit in 1 week.   Contact information:   [address] [place] [address] [phone]        The results of significant diagnostics from this hospitalization (including imaging, microbiology, ancillary and laboratory) are listed below for reference.    Significant Diagnostic Studies: Ct Abdomen Pelvis W Contrast  09/02/2012  **ADDENDUM** CREATED: 09/02/2012 03:07:48  Soft tissue attenuation along the left anterior abdominal  wall rectus musculature (image 61 of series 2), not significantly changed from 2007, presumably scar tissue.  **END ADDENDUM** SIGNED BY: Waneta Martins, M.D.   09/02/2012  *RADIOLOGY REPORT*  Clinical Data: Abdominal pain, elevated lipase  CT ABDOMEN AND PELVIS WITH CONTRAST  Technique:  Multidetector CT imaging of the abdomen and pelvis was performed following the standard protocol during bolus administration of intravenous contrast.  Contrast: OMNIPAQUE IOHEXOL 300 MG/ML  SOLN  Comparison: 03/23/2008  Findings: Limited images through the lung bases demonstrate no significant appreciable abnormality. The [facility] size is within normal limits. No pleural or pericardial effusion.  Unremarkable liver, spleen, adrenal glands.  Absent gallbladder.  No biliary ductal dilatation.  There is respiratory motion at the level of the pancreas.  Allowing for this, there is a nonspecific hypodense area along the pancreaticoduodenal groove as seen on series 2 image 41. Otherwise, homogeneous pancreatic enhancement.  No ductal dilatation. Questionable locule of gas along the pancreatic duct on image 39 however appears to be cystic or fat density on the delayed phase.  Nonobstructing right renal stones.  No hydronephrosis or hydroureter.  No CT evidence for colitis.  Normal appendix.  Small  bowel loops are normal course and caliber.  There is a locule of gas which extends along the anti dependent margin of the duodenum on series 2 image 42.  This may be intraluminal or reflect a small ulcer.  No peri duodenal fat stranding.  No free intraperitoneal air or fluid.  No lymphadenopathy.  Normal caliber aorta and branch vessels.  Thin-walled bladder.  Nonspecific appearance to the uterus, retroverted.  Left corpus luteal cyst.  No acute osseous finding.  IMPRESSION: The images through the level of the pancreas are degraded by motion.  There is mild hypodensity along the pancreaticoduodenal groove which may reflect volume averaging, groove pancreatitis, or duodenitis/ulcer disease. Consider EGD correlation and a non emergent MRI follow-up to exclude an underlying lesion.  Original Report Authenticated By: Jearld Lesch, M.D.    Dg Chest Port 1 View  08/12/2012  *RADIOLOGY REPORT*  Clinical Data: Sickle cell, chest pain  PORTABLE CHEST - 1 VIEW  Comparison: 07/16/2012  Findings: Central peribronchial cuffing and mild interstitial prominence.  No confluent airspace opacity.  [facility] size upper normal.  Elevated hemidiaphragms.  No pneumothorax or pleural effusion.  No acute osseous finding.  Surgical clips right upper quadrant.  IMPRESSION: Peribronchial cuffing and mild interstitial prominence may reflect bronchitis or sequelae of sickle cell.  No confluent airspace opacity.   Original Report Authenticated By: Jearld Lesch, M.D.      Labs: Basic Metabolic Panel:  Recent Labs Lab 09/02/12 0500 09/05/12 0810 09/06/12 0535 09/07/12 0530 09/08/12 0628  NA 138 138 136 136 134*  K 3.5 3.7 3.3* 4.3 4.8  CL 104 102 102 102 101  CO2 22 23 21 21 24   GLUCOSE 116* 70 81 153* 159*  BUN 9 10 9 8 11   CREATININE 0.48* 0.64 0.58 0.53 0.70  CALCIUM 8.2* 10.1 9.1 9.4 9.4   Liver Function Tests:  Recent Labs Lab 09/01/12 1838 09/02/12 0500 09/05/12 0810 09/06/12 0535 09/08/12 0628  AST 15  11 22 18 13   ALT 11 8 14 10 8   ALKPHOS 59 46 63 54 56  BILITOT 0.3 0.2* 0.3 0.4 0.4  PROT 8.7* 7.0 9.1* 7.7 8.0  ALBUMIN 4.2 3.2* 4.4 3.6 3.7    Recent Labs Lab 09/01/12 1838 09/02/12 0500 09/05/12 0810 09/07/12 0530 09/08/12 1610  LIPASE 70* 52 96* 113* 102*   CBC:  Recent Labs Lab 09/01/12 1838 09/02/12 0500 09/05/12 0810 09/06/12 0535 09/08/12 0628  WBC 14.8* 10.6* 10.5 8.4 7.4  NEUTROABS 12.8* 8.1* 8.3*  --   --   HGB 10.5* 8.8* 11.4* 10.1* 9.4*  HCT 34.4* 28.4* 35.4* 32.2* 31.1*  MCV 58.0* 57.6* 58.4* 57.9* 59.4*  PLT 283 259 350 291 248   CBG:  Recent Labs Lab 09/07/12 1458 09/07/12 1719 09/07/12 2016 09/08/12 0406 09/08/12 0743  GLUCAP 125* 108* 234* 133* 234*       Signed:  Conley Canal [phone] [facility] 09/08/2012, 3:04 PM  Attending  Patient seen and examined, agree with the assessment and plan as outlined above. Necessary edits to above documentation was performed. She is tolerating advancement of her diet, her belly is soft. EGD unremarkable. Stable for d/c. I spoke with [facility] at bedside today-she claimed that she has a appointment with the [facility] this coming Friday at 10:30 am. I reviewed outpatient Records at Urgent care where I had seen her once. She has been claiming that she will she a pain MD then as well, but claims due to weather this was not possible. In any event, [facility] told me that she had just on 08/20/12 filled a 90 tablet supply of Percocet that was supposed to last her 3 weeks, [facility] has been in the [name] for the last 3 days, and I think she was in [place] for 1 day as well a few days prior to this admit. She should have enough supply to last her till Friday. I am very hesistant to give her more narcotics as she has been getting them from numerous providers.   S Kamaryn Grimley

## 2012-09-09 NOTE — ED Provider Notes (Signed)
Medical screening examination/treatment/procedure(s) were performed by non-physician practitioner and as supervising physician I was immediately available for consultation/collaboration.   Lugene Beougher Y. Freeman Borba, MD 09/09/12 0032 

## 2012-09-10 ENCOUNTER — Encounter (HOSPITAL_COMMUNITY): Payer: Self-pay | Admitting: *Deleted

## 2012-09-10 ENCOUNTER — Emergency Department (HOSPITAL_COMMUNITY)
Admission: EM | Admit: 2012-09-10 | Discharge: 2012-09-10 | Disposition: A | Discharge status: Home / Self Care | Payer: Medicare Other | Attending: Emergency Medicine | Admitting: Emergency Medicine

## 2012-09-10 DIAGNOSIS — R112 Nausea with vomiting, unspecified: Secondary | ICD-10-CM | POA: Insufficient documentation

## 2012-09-10 DIAGNOSIS — Z862 Personal history of diseases of the blood and blood-forming organs and certain disorders involving the immune mechanism: Secondary | ICD-10-CM | POA: Insufficient documentation

## 2012-09-10 DIAGNOSIS — Z3202 Encounter for pregnancy test, result negative: Secondary | ICD-10-CM | POA: Insufficient documentation

## 2012-09-10 DIAGNOSIS — R1013 Epigastric pain: Secondary | ICD-10-CM | POA: Insufficient documentation

## 2012-09-10 DIAGNOSIS — Z8639 Personal history of other endocrine, nutritional and metabolic disease: Secondary | ICD-10-CM | POA: Insufficient documentation

## 2012-09-10 DIAGNOSIS — Z8669 Personal history of other diseases of the nervous system and sense organs: Secondary | ICD-10-CM | POA: Insufficient documentation

## 2012-09-10 DIAGNOSIS — E119 Type 2 diabetes mellitus without complications: Secondary | ICD-10-CM | POA: Insufficient documentation

## 2012-09-10 DIAGNOSIS — F17200 Nicotine dependence, unspecified, uncomplicated: Secondary | ICD-10-CM | POA: Insufficient documentation

## 2012-09-10 DIAGNOSIS — R109 Unspecified abdominal pain: Secondary | ICD-10-CM

## 2012-09-10 DIAGNOSIS — Z794 Long term (current) use of insulin: Secondary | ICD-10-CM | POA: Insufficient documentation

## 2012-09-10 LAB — POCT PREGNANCY, URINE: Preg Test, Ur: NEGATIVE

## 2012-09-10 LAB — URINALYSIS, ROUTINE W REFLEX MICROSCOPIC
Bilirubin Urine: NEGATIVE
Glucose, UA: NEGATIVE mg/dL
Ketones, ur: NEGATIVE mg/dL
Nitrite: NEGATIVE
Protein, ur: NEGATIVE mg/dL
Specific Gravity, Urine: 1.016 (ref 1.005–1.030)
Urobilinogen, UA: 0.2 mg/dL (ref 0.0–1.0)
pH: 8 (ref 5.0–8.0)

## 2012-09-10 LAB — CBC WITH DIFFERENTIAL/PLATELET
Basophils Absolute: 0.1 10*3/uL (ref 0.0–0.1)
Basophils Relative: 1 % (ref 0–1)
Eosinophils Absolute: 0.1 10*3/uL (ref 0.0–0.7)
Eosinophils Relative: 1 % (ref 0–5)
HCT: 33.5 % — ABNORMAL LOW (ref 36.0–46.0)
Hemoglobin: 10.9 g/dL — ABNORMAL LOW (ref 12.0–15.0)
Lymphocytes Relative: 12 % (ref 12–46)
Lymphs Abs: 1.3 10*3/uL (ref 0.7–4.0)
MCH: 18.5 pg — ABNORMAL LOW (ref 26.0–34.0)
MCHC: 32.5 g/dL (ref 30.0–36.0)
MCV: 57 fL — ABNORMAL LOW (ref 78.0–100.0)
Monocytes Absolute: 0.3 10*3/uL (ref 0.1–1.0)
Monocytes Relative: 3 % (ref 3–12)
Neutro Abs: 8.9 10*3/uL — ABNORMAL HIGH (ref 1.7–7.7)
Neutrophils Relative %: 83 % — ABNORMAL HIGH (ref 43–77)
Platelets: 295 10*3/uL (ref 150–400)
RBC: 5.88 MIL/uL — ABNORMAL HIGH (ref 3.87–5.11)
RDW: 21.2 % — ABNORMAL HIGH (ref 11.5–15.5)
WBC: 10.7 10*3/uL — ABNORMAL HIGH (ref 4.0–10.5)

## 2012-09-10 LAB — COMPREHENSIVE METABOLIC PANEL WITH GFR
ALT: 21 U/L (ref 0–35)
AST: 32 U/L (ref 0–37)
Albumin: 4.5 g/dL (ref 3.5–5.2)
Alkaline Phosphatase: 70 U/L (ref 39–117)
BUN: 15 mg/dL (ref 6–23)
CO2: 25 meq/L (ref 19–32)
Calcium: 10.3 mg/dL (ref 8.4–10.5)
Chloride: 98 meq/L (ref 96–112)
Creatinine, Ser: 0.69 mg/dL (ref 0.50–1.10)
GFR calc Af Amer: >90 mL/min
GFR calc non Af Amer: >90 mL/min
Glucose, Bld: 90 mg/dL (ref 70–99)
Potassium: 3.9 meq/L (ref 3.5–5.1)
Sodium: 137 meq/L (ref 135–145)
Total Bilirubin: 0.5 mg/dL (ref 0.3–1.2)
Total Protein: 9.1 g/dL — ABNORMAL HIGH (ref 6.0–8.3)

## 2012-09-10 LAB — LIPASE, BLOOD: Lipase: 92 U/L — ABNORMAL HIGH (ref 11–59)

## 2012-09-10 LAB — URINE MICROSCOPIC-ADD ON

## 2012-09-10 MED ORDER — OXYCODONE-ACETAMINOPHEN 5-325 MG PO TABS: 2.0000 | ORAL_TABLET | ORAL | Status: DC | PRN

## 2012-09-10 MED ORDER — SODIUM CHLORIDE 0.9 % IV BOLUS (SEPSIS)
1000.0000 mL | Freq: Once | INTRAVENOUS | Status: AC
  Administered 2012-09-10: 1000 mL via INTRAVENOUS

## 2012-09-10 MED ORDER — HYDROMORPHONE HCL PF 1 MG/ML IJ SOLN
0.5000 mg | Freq: Once | INTRAMUSCULAR | Status: AC
  Administered 2012-09-10: 0.5 mg via INTRAVENOUS
  Filled 2012-09-10: qty 1

## 2012-09-10 MED ORDER — PROMETHAZINE HCL 25 MG/ML IJ SOLN
25.0000 mg | Freq: Once | INTRAMUSCULAR | Status: AC
  Administered 2012-09-10: 25 mg via INTRAVENOUS
  Filled 2012-09-10: qty 1

## 2012-09-10 MED ORDER — PROMETHAZINE HCL 25 MG PO TABS: 25.0000 mg | ORAL_TABLET | Freq: Four times a day (QID) | ORAL | Status: DC | PRN

## 2012-09-10 NOTE — ED Notes (Signed)
Pt is here with abdominal pain and vomiting since 0700 this am

## 2012-09-10 NOTE — ED Provider Notes (Signed)
History     CSN: [id number]  Arrival date & time 09/10/12  1535   First MD Initiated Contact with Patient 09/10/12 1546      Chief Complaint  Patient presents with  . Abdominal Pain    (Consider location/radiation/quality/duration/timing/severity/associated sxs/prior treatment) HPI.... abdominal pain, nausea, vomiting since 0700 today. Recent admission to the hospital for abdominal pain. EGD showed a hiatal hernia on 09/07/2012.  No fever, sweats, chills. Nothing makes symptoms better or worse. Severity is mild to moderate. No radiation   Past Medical History  Diagnosis Date  . Diabetes mellitus   . Anemia   . Sickle cell disease   . Headache   . Seasonal allergies   . Abnormal Pap smear   . Sickle cell anemia   . Gout     Past Surgical History  Procedure Laterality Date  . Cholecystectomy    . Cesarean section    . Esophagogastroduodenoscopy N/A 09/07/2012    Procedure: ESOPHAGOGASTRODUODENOSCOPY (EGD);  Surgeon: Charna Elizabeth, MD;  Location: [facility] ENDOSCOPY;  Service: Endoscopy;  Laterality: N/A;    Family History  Problem Relation Age of Onset  . Hypertension Mother   . Diabetes Mother   . Hypertension Maternal Aunt   . Diabetes Maternal Aunt   . Hypertension Maternal Uncle   . Diabetes Maternal Uncle   . Hypertension Maternal Grandmother     History  Substance Use Topics  . Smoking status: Current Every Day Smoker -- 0.50 packs/day    Types: Cigarettes  . Smokeless tobacco: Never Used  . Alcohol Use: No    OB History   Grav Para Term Preterm Abortions TAB SAB Ect Mult Living   8 8 7 1  0  0   7      Review of Systems  All other systems reviewed and are negative.    Allergies  Ciprofloxacin; Morphine and related; and Zofran  Home Medications   Current Outpatient Rx  Name  Route  Sig  Dispense  Refill  . insulin aspart (NOVOLOG) 100 UNIT/ML injection   Subcutaneous   Inject 10-20 Units into the skin See admin instructions. Sliding scale          . insulin glargine (LANTUS) 100 UNIT/ML injection   Subcutaneous   Inject 70 Units into the skin at bedtime.         Marland Kitchen oxyCODONE-acetaminophen (PERCOCET) 10-325 MG per tablet   Oral   Take 0.5 tablets by mouth every 4 (four) hours as needed for pain.   30 tablet      . oxyCODONE-acetaminophen (PERCOCET) 5-325 MG per tablet   Oral   Take 2 tablets by mouth every 4 (four) hours as needed for pain.   20 tablet   0   . promethazine (PHENERGAN) 25 MG tablet   Oral   Take 1 tablet (25 mg total) by mouth every 6 (six) hours as needed for nausea.   20 tablet   0     BP 150/110  Pulse 104  Temp(Src) 98.5 F (36.9 C) (Axillary)  Resp 16  SpO2 100%  LMP 08/15/2012  Physical Exam  Nursing note and vitals reviewed. Constitutional: She is oriented to person, place, and time. She appears well-developed and well-nourished.  HENT:  Head: Normocephalic and atraumatic.  Eyes: Conjunctivae and EOM are normal. Pupils are equal, round, and reactive to light.  Neck: Normal range of motion. Neck supple.  Cardiovascular: Normal rate, regular rhythm and normal heart sounds.   Pulmonary/Chest: Effort normal and  breath sounds normal.  Abdominal: Soft. Bowel sounds are normal.  Mild epigastric tenderness  Musculoskeletal: Normal range of motion.  Neurological: She is alert and oriented to person, place, and time.  Skin: Skin is warm and dry.  Psychiatric: She has a normal mood and affect.    ED Course  Procedures (including critical care time)  Labs Reviewed  CBC WITH DIFFERENTIAL - Abnormal; Notable for the following:    WBC 10.7 (*)    RBC 5.88 (*)    Hemoglobin 10.9 (*)    HCT 33.5 (*)    MCV 57.0 (*)    MCH 18.5 (*)    RDW 21.2 (*)    Neutrophils Relative 83 (*)    Neutro Abs 8.9 (*)    All other components within normal limits  COMPREHENSIVE METABOLIC PANEL - Abnormal; Notable for the following:    Total Protein 9.1 (*)    All other components within normal limits   LIPASE, BLOOD - Abnormal; Notable for the following:    Lipase 92 (*)    All other components within normal limits  URINALYSIS, ROUTINE W REFLEX MICROSCOPIC - Abnormal; Notable for the following:    APPearance CLOUDY (*)    Hgb urine dipstick LARGE (*)    Leukocytes, UA TRACE (*)    All other components within normal limits  URINE MICROSCOPIC-ADD ON - Abnormal; Notable for the following:    Squamous Epithelial / LPF FEW (*)    Bacteria, UA FEW (*)    All other components within normal limits  URINE CULTURE  POCT PREGNANCY, URINE   No results found.   1. Abdominal pain       MDM  Lipase is minimally elevated. This is no different than previous laboratory results.  IV fluids and pain medicine given. Discharge meds include Phenergan 25 mg# 20 and Percocet #20.        Donnetta Hutching, MD 09/10/12 Jerene Bears

## 2012-09-10 NOTE — ED Notes (Signed)
MD at bedside. Adriana Simas MD

## 2012-09-10 NOTE — ED Notes (Signed)
Patient states that she is not ready to give urine for study

## 2012-09-11 ENCOUNTER — Encounter (HOSPITAL_COMMUNITY): Payer: Self-pay | Admitting: *Deleted

## 2012-09-11 ENCOUNTER — Emergency Department (HOSPITAL_COMMUNITY)
Admission: EM | Admit: 2012-09-11 | Discharge: 2012-09-11 | Disposition: A | Discharge status: Home / Self Care | Payer: Medicare Other | Attending: Emergency Medicine | Admitting: Emergency Medicine

## 2012-09-11 DIAGNOSIS — R109 Unspecified abdominal pain: Secondary | ICD-10-CM | POA: Insufficient documentation

## 2012-09-11 DIAGNOSIS — Z9889 Other specified postprocedural states: Secondary | ICD-10-CM | POA: Insufficient documentation

## 2012-09-11 DIAGNOSIS — M109 Gout, unspecified: Secondary | ICD-10-CM | POA: Insufficient documentation

## 2012-09-11 DIAGNOSIS — Z9089 Acquired absence of other organs: Secondary | ICD-10-CM | POA: Insufficient documentation

## 2012-09-11 DIAGNOSIS — F17200 Nicotine dependence, unspecified, uncomplicated: Secondary | ICD-10-CM | POA: Insufficient documentation

## 2012-09-11 DIAGNOSIS — Z794 Long term (current) use of insulin: Secondary | ICD-10-CM | POA: Insufficient documentation

## 2012-09-11 DIAGNOSIS — Z79899 Other long term (current) drug therapy: Secondary | ICD-10-CM | POA: Insufficient documentation

## 2012-09-11 DIAGNOSIS — Z862 Personal history of diseases of the blood and blood-forming organs and certain disorders involving the immune mechanism: Secondary | ICD-10-CM | POA: Insufficient documentation

## 2012-09-11 DIAGNOSIS — Z8719 Personal history of other diseases of the digestive system: Secondary | ICD-10-CM | POA: Insufficient documentation

## 2012-09-11 DIAGNOSIS — Z8669 Personal history of other diseases of the nervous system and sense organs: Secondary | ICD-10-CM | POA: Insufficient documentation

## 2012-09-11 DIAGNOSIS — E119 Type 2 diabetes mellitus without complications: Secondary | ICD-10-CM | POA: Insufficient documentation

## 2012-09-11 DIAGNOSIS — R112 Nausea with vomiting, unspecified: Secondary | ICD-10-CM | POA: Insufficient documentation

## 2012-09-11 MED ORDER — PANTOPRAZOLE SODIUM 20 MG PO TBEC: 20.0000 mg | DELAYED_RELEASE_TABLET | Freq: Every day | ORAL | Status: DC

## 2012-09-11 MED ORDER — DIPHENHYDRAMINE HCL 50 MG/ML IJ SOLN
25.0000 mg | Freq: Once | INTRAMUSCULAR | Status: AC
  Administered 2012-09-11: 02:00:00 via INTRAVENOUS
  Filled 2012-09-11: qty 1

## 2012-09-11 MED ORDER — METOCLOPRAMIDE HCL 5 MG/ML IJ SOLN
10.0000 mg | Freq: Once | INTRAMUSCULAR | Status: AC
  Administered 2012-09-11: 10 mg via INTRAVENOUS
  Filled 2012-09-11: qty 2

## 2012-09-11 MED ORDER — SODIUM CHLORIDE 0.9 % IV BOLUS (SEPSIS)
1000.0000 mL | Freq: Once | INTRAVENOUS | Status: AC
  Administered 2012-09-11: 1000 mL via INTRAVENOUS

## 2012-09-11 MED ORDER — PROMETHAZINE HCL 25 MG RE SUPP: 25.0000 mg | Freq: Four times a day (QID) | RECTAL | Status: DC | PRN

## 2012-09-11 MED ORDER — SODIUM CHLORIDE 0.9 % IV SOLN
80.0000 mg | Freq: Once | INTRAVENOUS | Status: AC
  Administered 2012-09-11: 80 mg via INTRAVENOUS
  Filled 2012-09-11: qty 80

## 2012-09-11 MED ORDER — METOCLOPRAMIDE HCL 10 MG PO TABS: 10.0000 mg | ORAL_TABLET | Freq: Four times a day (QID) | ORAL | Status: DC

## 2012-09-11 NOTE — ED Notes (Signed)
EAV:[id number]> Expected date:<BR> Expected time:<BR> Means of arrival:<BR> Comments:<BR> EMS/37 yo female with N/V-treated at [facility] earlier for same symptoms-no better

## 2012-09-11 NOTE — ED Provider Notes (Signed)
History     CSN: [id number]  Arrival date & time 09/11/12  0116   First MD Initiated Contact with Patient 09/11/12 0147      Chief Complaint  Patient presents with  . Emesis    (Consider location/radiation/quality/duration/timing/severity/associated sxs/prior treatment) HPI 37 year old female presents to the emergency apartment with complaint of nausea and vomiting, abdominal pain.  Patient with recent admission for same, discharge from the hospital on Monday.  Patient had elevated lipase, an upper EGD, which showed hiatal hernia.  Only.  Patient admitted earlier in the month.  For sickle cell crisis, however, found to not have sickle cell disease.  Patient was seen yesterday in the ED for nausea and vomiting and abdominal pain.  She had persistent slightly elevated lipase.  She was given prescriptions for Percocet and Phenergan.  She reports despite these medications, she is still vomiting.  Prior discharge summary reviewed, concern for opiate dependence, and drug-seeking behavior.  Patient denies fever, chills, no travel, no unusual foods, no sick contacts.  Past Medical History  Diagnosis Date  . Diabetes mellitus   . Anemia   . Headache   . Seasonal allergies   . Abnormal Pap smear   . Gout     Past Surgical History  Procedure Laterality Date  . Cholecystectomy    . Cesarean section    . Esophagogastroduodenoscopy N/A 09/07/2012    Procedure: ESOPHAGOGASTRODUODENOSCOPY (EGD);  Surgeon: Charna Elizabeth, MD;  Location: [facility] ENDOSCOPY;  Service: Endoscopy;  Laterality: N/A;    Family History  Problem Relation Age of Onset  . Hypertension Mother   . Diabetes Mother   . Hypertension Maternal Aunt   . Diabetes Maternal Aunt   . Hypertension Maternal Uncle   . Diabetes Maternal Uncle   . Hypertension Maternal Grandmother     History  Substance Use Topics  . Smoking status: Current Every Day Smoker -- 0.50 packs/day    Types: Cigarettes  . Smokeless tobacco: Never Used  .  Alcohol Use: No    OB History   Grav Para Term Preterm Abortions TAB SAB Ect Mult Living   8 8 7 1  0  0   7      Review of Systems  All other systems reviewed and are negative.    Allergies  Ciprofloxacin; Morphine and related; and Zofran  Home Medications   Current Outpatient Rx  Name  Route  Sig  Dispense  Refill  . insulin aspart (NOVOLOG) 100 UNIT/ML injection   Subcutaneous   Inject 10-20 Units into the skin See admin instructions. Sliding scale         . insulin glargine (LANTUS) 100 UNIT/ML injection   Subcutaneous   Inject 70 Units into the skin at bedtime.         Marland Kitchen oxyCODONE-acetaminophen (PERCOCET) 5-325 MG per tablet   Oral   Take 2 tablets by mouth every 4 (four) hours as needed for pain.   20 tablet   0   . promethazine (PHENERGAN) 25 MG tablet   Oral   Take 1 tablet (25 mg total) by mouth every 6 (six) hours as needed for nausea.   20 tablet   0   . metoCLOPramide (REGLAN) 10 MG tablet   Oral   Take 1 tablet (10 mg total) by mouth every 6 (six) hours.   30 tablet   0   . pantoprazole (PROTONIX) 20 MG tablet   Oral   Take 1 tablet (20 mg total) by mouth daily.  30 tablet   0   . promethazine (PHENERGAN) 25 MG suppository   Rectal   Place 1 suppository (25 mg total) rectally every 6 (six) hours as needed for nausea.   12 each   0     BP 149/82  Pulse 93  Temp(Src) 99.6 F (37.6 C) (Oral)  Resp 20  Ht 5\' 6"  (1.676 m)  Wt 180 lb (81.647 kg)  BMI 29.07 kg/m2  SpO2 99%  LMP 08/15/2012  Physical Exam  Nursing note and vitals reviewed. Constitutional: She is oriented to person, place, and time. She appears well-developed and well-nourished.  HENT:  Head: Normocephalic and atraumatic.  Nose: Nose normal.  Mouth/Throat: Oropharynx is clear and moist.  Eyes: Conjunctivae and EOM are normal. Pupils are equal, round, and reactive to light.  Neck: Normal range of motion. Neck supple. No JVD present. No tracheal deviation present.  No thyromegaly present.  Cardiovascular: Normal rate, regular rhythm, normal heart sounds and intact distal pulses.  Exam reveals no gallop and no friction rub.   No murmur heard. Pulmonary/Chest: Effort normal and breath sounds normal. No stridor. No respiratory distress. She has no wheezes. She has no rales. She exhibits no tenderness.  Abdominal: Soft. Bowel sounds are normal. She exhibits no distension and no mass. There is no tenderness. There is no rebound and no guarding.  Patient with mild epigastric tenderness with palpation, however, pressure with a stethoscope head during auscultation of bowel sounds.  Does not elicit pain.  Musculoskeletal: Normal range of motion. She exhibits no edema and no tenderness.  Lymphadenopathy:    She has no cervical adenopathy.  Neurological: She is alert and oriented to person, place, and time. She exhibits normal muscle tone. Coordination normal.  Skin: Skin is dry. No rash noted. No erythema. No pallor.  Psychiatric: She has a normal mood and affect. Her behavior is normal. Judgment and thought content normal.    ED Course  Procedures (including critical care time)  Labs Reviewed - No data to display No results found.   1. Nausea & vomiting       MDM  37 year old female with nausea and vomiting and ongoing abdominal pain.  Patient had labs.  Less than 24 hours ago.  I do not feel the need to repeat them.  Patient does not clinically appear dehydrated.  Given history of diabetes, may have some gastroparesis.  Will start patient on Reglan in addition to her ongoing Phenergan.  Patient was to be sent home with a PPI, but reports she has not started this yet.  We'll give her a prescription for same.  Patient reports after Reglan Benadryl, she is feeling somewhat better.  To followup with her per care Dr.       Olivia Mackie, MD 09/11/12 [phone]

## 2012-09-11 NOTE — ED Notes (Signed)
Per EMS report. Pt c/o flu like symptoms, experiencing n/v and abdominal cramping x 3 days. [facility], pt says she is not better. Tachycardic on the monitor, iv established en route

## 2012-09-11 NOTE — ED Notes (Signed)
Patient says that she was in the hospital towards the end of last week with similar symptoms. She says she went home on Monday and had to go back to [facility] ED yesterday. She was sent home with pain and nausea meds which she took both and has not had any improvement.

## 2012-09-12 LAB — URINE CULTURE: Colony Count: >=100000

## 2012-09-13 ENCOUNTER — Telehealth (HOSPITAL_COMMUNITY): Payer: Self-pay | Admitting: Emergency Medicine

## 2012-09-13 NOTE — ED Notes (Signed)
+   Urine Chart sent to EDP office for review. 

## 2012-09-13 NOTE — ED Notes (Signed)
Patient has +Urine culture. Checking to see if appropriately treated. °

## 2012-09-15 NOTE — ED Notes (Signed)
No treatment needed per [name]. 

## 2012-10-07 ENCOUNTER — Emergency Department (HOSPITAL_COMMUNITY)
Admission: EM | Admit: 2012-10-07 | Discharge: 2012-10-08 | Disposition: A | Discharge status: Home / Self Care | Payer: Medicare Other | Attending: Emergency Medicine | Admitting: Emergency Medicine

## 2012-10-07 ENCOUNTER — Encounter (HOSPITAL_COMMUNITY): Payer: Self-pay | Admitting: Emergency Medicine

## 2012-10-07 DIAGNOSIS — Z8739 Personal history of other diseases of the musculoskeletal system and connective tissue: Secondary | ICD-10-CM | POA: Insufficient documentation

## 2012-10-07 DIAGNOSIS — Z862 Personal history of diseases of the blood and blood-forming organs and certain disorders involving the immune mechanism: Secondary | ICD-10-CM | POA: Insufficient documentation

## 2012-10-07 DIAGNOSIS — Z794 Long term (current) use of insulin: Secondary | ICD-10-CM | POA: Insufficient documentation

## 2012-10-07 DIAGNOSIS — M549 Dorsalgia, unspecified: Secondary | ICD-10-CM | POA: Insufficient documentation

## 2012-10-07 DIAGNOSIS — N61 Mastitis without abscess: Secondary | ICD-10-CM | POA: Insufficient documentation

## 2012-10-07 DIAGNOSIS — Z8669 Personal history of other diseases of the nervous system and sense organs: Secondary | ICD-10-CM | POA: Insufficient documentation

## 2012-10-07 DIAGNOSIS — F17200 Nicotine dependence, unspecified, uncomplicated: Secondary | ICD-10-CM | POA: Insufficient documentation

## 2012-10-07 DIAGNOSIS — N611 Abscess of the breast and nipple: Secondary | ICD-10-CM

## 2012-10-07 DIAGNOSIS — E119 Type 2 diabetes mellitus without complications: Secondary | ICD-10-CM | POA: Insufficient documentation

## 2012-10-07 NOTE — ED Notes (Signed)
Pt states that she has multiple boils that are recurrent. Pt states that she is also having back pain. Pt home rx pain medication is not helping with her pain.

## 2012-10-07 NOTE — ED Notes (Signed)
PT. REPORTS ABSCESSES " BOILS" AT BILATERAL BREAST AND VAGINA , ALSO REPORTS LOW BACK PAIN .

## 2012-10-08 ENCOUNTER — Encounter (HOSPITAL_COMMUNITY): Payer: Self-pay | Admitting: Emergency Medicine

## 2012-10-08 ENCOUNTER — Emergency Department (HOSPITAL_COMMUNITY)
Admission: EM | Admit: 2012-10-08 | Discharge: 2012-10-08 | Disposition: A | Discharge status: Home / Self Care | Payer: Medicare Other | Attending: Emergency Medicine | Admitting: Emergency Medicine

## 2012-10-08 DIAGNOSIS — Z8639 Personal history of other endocrine, nutritional and metabolic disease: Secondary | ICD-10-CM | POA: Insufficient documentation

## 2012-10-08 DIAGNOSIS — R739 Hyperglycemia, unspecified: Secondary | ICD-10-CM

## 2012-10-08 DIAGNOSIS — Z8709 Personal history of other diseases of the respiratory system: Secondary | ICD-10-CM | POA: Insufficient documentation

## 2012-10-08 DIAGNOSIS — Z8679 Personal history of other diseases of the circulatory system: Secondary | ICD-10-CM | POA: Insufficient documentation

## 2012-10-08 DIAGNOSIS — E119 Type 2 diabetes mellitus without complications: Secondary | ICD-10-CM | POA: Insufficient documentation

## 2012-10-08 DIAGNOSIS — Z794 Long term (current) use of insulin: Secondary | ICD-10-CM | POA: Insufficient documentation

## 2012-10-08 DIAGNOSIS — N611 Abscess of the breast and nipple: Secondary | ICD-10-CM

## 2012-10-08 DIAGNOSIS — F17200 Nicotine dependence, unspecified, uncomplicated: Secondary | ICD-10-CM | POA: Insufficient documentation

## 2012-10-08 DIAGNOSIS — Z862 Personal history of diseases of the blood and blood-forming organs and certain disorders involving the immune mechanism: Secondary | ICD-10-CM | POA: Insufficient documentation

## 2012-10-08 DIAGNOSIS — N61 Mastitis without abscess: Secondary | ICD-10-CM | POA: Insufficient documentation

## 2012-10-08 LAB — POCT I-STAT, CHEM 8
Glucose, Bld: 392 mg/dL — ABNORMAL HIGH (ref 70–99)
HCT: 33 % — ABNORMAL LOW (ref 36.0–46.0)
Hemoglobin: 11.2 g/dL — ABNORMAL LOW (ref 12.0–15.0)
Potassium: 3.8 mEq/L (ref 3.5–5.1)
Sodium: 135 mEq/L (ref 135–145)

## 2012-10-08 LAB — GLUCOSE, CAPILLARY: Glucose-Capillary: 367 mg/dL — ABNORMAL HIGH (ref 70–99)

## 2012-10-08 MED ORDER — DOXYCYCLINE HYCLATE 100 MG PO CAPS: 100.0000 mg | ORAL_CAPSULE | Freq: Two times a day (BID) | ORAL | Status: DC

## 2012-10-08 MED ORDER — IBUPROFEN 400 MG PO TABS
600.0000 mg | ORAL_TABLET | Freq: Once | ORAL | Status: AC
  Administered 2012-10-08: 600 mg via ORAL
  Filled 2012-10-08: qty 2

## 2012-10-08 NOTE — ED Provider Notes (Signed)
History     CSN: [id number]  Arrival date & time 10/08/12  0019   First MD Initiated Contact with Patient 10/08/12 0024      Chief Complaint  Patient presents with  . Hyperglycemia    (Consider location/radiation/quality/duration/timing/severity/associated sxs/prior treatment) The history is provided by the patient and medical records.   I personally evaluated the patient earlier tonight and she checked back in because her blood sugar was in the 360s.  I was aware of this at time of discharge.  The patient denies nausea and vomiting.  She has a left breast abscess for which I prescribed doxycycline and for which she is refusing incision and drainage at the bedside.  She continues to refuse this.  She feels as though her blood sugar was not addressed and that she checked herself back in the emergency department.  She has no other new complaints besides hyperglycemia.  She takes Lantus and NovoLog at home.  She's been needing and drinking normally.  She states she's been compliant with her blood sugar medicines  Past Medical History  Diagnosis Date  . Diabetes mellitus   . Anemia   . Headache   . Seasonal allergies   . Abnormal Pap smear   . Gout     Past Surgical History  Procedure Laterality Date  . Cholecystectomy    . Cesarean section    . Esophagogastroduodenoscopy N/A 09/07/2012    Procedure: ESOPHAGOGASTRODUODENOSCOPY (EGD);  Surgeon: Charna Elizabeth, MD;  Location: [facility] ENDOSCOPY;  Service: Endoscopy;  Laterality: N/A;    Family History  Problem Relation Age of Onset  . Hypertension Mother   . Diabetes Mother   . Hypertension Maternal Aunt   . Diabetes Maternal Aunt   . Hypertension Maternal Uncle   . Diabetes Maternal Uncle   . Hypertension Maternal Grandmother     History  Substance Use Topics  . Smoking status: Current Every Day Smoker -- 0.50 packs/day    Types: Cigarettes  . Smokeless tobacco: Never Used  . Alcohol Use: No    OB History   Grav Para Term  Preterm Abortions TAB SAB Ect Mult Living   8 8 7 1  0  0   7      Review of Systems  All other systems reviewed and are negative.    Allergies  Reglan; Ciprofloxacin; Morphine and related; and Zofran  Home Medications   Current Outpatient Rx  Name  Route  Sig  Dispense  Refill  . insulin aspart (NOVOLOG) 100 UNIT/ML injection   Subcutaneous   Inject 10-20 Units into the skin See admin instructions. Sliding scale         . insulin glargine (LANTUS) 100 UNIT/ML injection   Subcutaneous   Inject 70 Units into the skin at bedtime.         Marland Kitchen oxyCODONE-acetaminophen (PERCOCET) 5-325 MG per tablet   Oral   Take 2 tablets by mouth every 4 (four) hours as needed for pain.   20 tablet   0   . doxycycline (VIBRAMYCIN) 100 MG capsule   Oral   Take 1 capsule (100 mg total) by mouth 2 (two) times daily.   20 capsule   0     BP 138/70  Pulse 96  Temp(Src) 99.2 F (37.3 C) (Oral)  Resp 18  SpO2 100%  LMP 09/09/2012  Physical Exam  Nursing note and vitals reviewed. Constitutional: She is oriented to person, place, and time. She appears well-developed and well-nourished. No distress.  HENT:  Head: Normocephalic and atraumatic.  Eyes: EOM are normal.  Neck: Normal range of motion.  Cardiovascular: Normal rate and regular rhythm.   Pulmonary/Chest: Effort normal.  Abdominal: Soft.  Musculoskeletal: Normal range of motion.  Neurological: She is alert and oriented to person, place, and time.  Skin: Skin is warm and dry.  Psychiatric: She has a normal mood and affect. Judgment normal.    ED Course  Procedures (including critical care time)  Labs Reviewed  POCT I-STAT, CHEM 8 - Abnormal; Notable for the following:    Glucose, Bld 392 (*)    Hemoglobin 11.2 (*)    HCT 33.0 (*)    All other components within normal limits   No results found.   1. Hyperglycemia   2. Left breast abscess       MDM    hyperglycemia without evidence of DKA.  The patient has  her insulin at home that she will use.  Normal vital signs.  No indication for additional workup or treatment in the emergency department       Lyanne Co, MD 10/08/12 [phone]

## 2012-10-08 NOTE — ED Provider Notes (Signed)
History     CSN: [id number]  Arrival date & time 10/07/12  2230   First MD Initiated Contact with Patient 10/07/12 2346      Chief Complaint  Patient presents with  . Abscess  . Back Pain    The history is provided by the patient and medical records.   the patient reports developing abscess to her left breast.  She reports this is just under her left nipple.  She's had a small amount of drainage.  She also reports healing abscesses in her groin and her right breast.  She denies fevers and chills.  She also does report some right upper back pain.  No shortness of breath.  No productive cough.  No history of PE or DVT.  She does have a history of diabetes but reports her blood sugars have been normal over the past several days.  She's currently taking Percocet without improvement in her pain.  She refuses incision and drainage at the bedside.  No abdominal pain.  No urinary complaints. The patient has been seen in this emergency room 13 times the last 6 months.    Past Medical History  Diagnosis Date  . Diabetes mellitus   . Anemia   . Headache   . Seasonal allergies   . Abnormal Pap smear   . Gout     Past Surgical History  Procedure Laterality Date  . Cholecystectomy    . Cesarean section    . Esophagogastroduodenoscopy N/A 09/07/2012    Procedure: ESOPHAGOGASTRODUODENOSCOPY (EGD);  Surgeon: Charna Elizabeth, MD;  Location: [facility] ENDOSCOPY;  Service: Endoscopy;  Laterality: N/A;    Family History  Problem Relation Age of Onset  . Hypertension Mother   . Diabetes Mother   . Hypertension Maternal Aunt   . Diabetes Maternal Aunt   . Hypertension Maternal Uncle   . Diabetes Maternal Uncle   . Hypertension Maternal Grandmother     History  Substance Use Topics  . Smoking status: Current Every Day Smoker -- 0.50 packs/day    Types: Cigarettes  . Smokeless tobacco: Never Used  . Alcohol Use: No    OB History   Grav Para Term Preterm Abortions TAB SAB Ect Mult Living   8 8 7 1   0  0   7      Review of Systems  Musculoskeletal: Positive for back pain.  All other systems reviewed and are negative.    Allergies  Reglan; Ciprofloxacin; Morphine and related; and Zofran  Home Medications   Current Outpatient Rx  Name  Route  Sig  Dispense  Refill  . insulin aspart (NOVOLOG) 100 UNIT/ML injection   Subcutaneous   Inject 10-20 Units into the skin See admin instructions. Sliding scale         . insulin glargine (LANTUS) 100 UNIT/ML injection   Subcutaneous   Inject 70 Units into the skin at bedtime.         Marland Kitchen oxyCODONE-acetaminophen (PERCOCET) 5-325 MG per tablet   Oral   Take 2 tablets by mouth every 4 (four) hours as needed for pain.   20 tablet   0     BP 134/66  Pulse 99  Temp(Src) 98.6 F (37 C) (Oral)  Resp 14  SpO2 100%  LMP 09/09/2012  Physical Exam  Nursing note and vitals reviewed. Constitutional: She is oriented to person, place, and time. She appears well-developed and well-nourished. No distress.  HENT:  Head: Normocephalic and atraumatic.  Eyes: EOM are normal.  Neck: Normal range of motion.  Cardiovascular: Normal rate, regular rhythm and normal heart sounds.   Pulmonary/Chest: Effort normal and breath sounds normal.  Small dime-sized fluctuant mass at approximately 6:00 on the left breast just inferior to the nipple and not involving the area low.  No drainage.  No surrounding erythema.  Mild tenderness.  No frank discharge or drainage.  Abdominal: Soft. She exhibits no distension. There is no tenderness.  Musculoskeletal: Normal range of motion.  Neurological: She is alert and oriented to person, place, and time.  Skin: Skin is warm and dry.  Psychiatric: She has a normal mood and affect. Judgment normal.    ED Course  Procedures (including critical care time)  Labs Reviewed - No data to display No results found.   1. Left breast abscess   2. Back pain       MDM  Patient needs incision and drainage of left  breast abscess but she is adamantly refusing this procedure.  I tried to convince her that this is necessary but she does not want it done.  The patient replaced on doxycycline instructed the patient to perform warm compresses.  She understands return to ER for new or worsening symptoms.  This is musculoskeletal right back pain.  Treated with anti-inflammatories.  No neurologic deficit.  No abdominal pain or chest pain.  No shortness of breath.  Vital signs are normal.  Discharge home in good condition.  She has a long-standing history of narcotic abuse        Lyanne Co, MD 10/08/12 0010

## 2012-10-08 NOTE — ED Notes (Signed)
PT. WAS JUST DISCHARGED SEVERAL MINUTES AGO , CHECK BACK IN DUE TO HYPERGLYCEMIA. STATES HER BLOOD SUGAR IS 367.

## 2012-10-08 NOTE — ED Notes (Signed)
Pt request CBG to be taken due to CBG running 300's 400's. EDP aware and states even if high pt can be discharged.

## 2012-10-08 NOTE — ED Notes (Signed)
Went to discharge pt, pt request to see EDP. EDP aware.

## 2012-10-08 NOTE — ED Notes (Addendum)
Pt returned stating that she wanted insulin for her blood sugar. Pt states that her back is still hurting.

## 2012-11-04 ENCOUNTER — Encounter: Payer: Self-pay | Admitting: General Practice

## 2012-11-30 ENCOUNTER — Encounter (HOSPITAL_COMMUNITY): Payer: Self-pay | Admitting: Emergency Medicine

## 2012-11-30 ENCOUNTER — Emergency Department (HOSPITAL_COMMUNITY)
Admission: EM | Admit: 2012-11-30 | Discharge: 2012-11-30 | Disposition: A | Discharge status: Home / Self Care | Payer: Medicare Other | Attending: Emergency Medicine | Admitting: Emergency Medicine

## 2012-11-30 DIAGNOSIS — D649 Anemia, unspecified: Secondary | ICD-10-CM | POA: Insufficient documentation

## 2012-11-30 DIAGNOSIS — F3289 Other specified depressive episodes: Secondary | ICD-10-CM | POA: Insufficient documentation

## 2012-11-30 DIAGNOSIS — J309 Allergic rhinitis, unspecified: Secondary | ICD-10-CM | POA: Insufficient documentation

## 2012-11-30 DIAGNOSIS — Z862 Personal history of diseases of the blood and blood-forming organs and certain disorders involving the immune mechanism: Secondary | ICD-10-CM | POA: Insufficient documentation

## 2012-11-30 DIAGNOSIS — F329 Major depressive disorder, single episode, unspecified: Secondary | ICD-10-CM | POA: Insufficient documentation

## 2012-11-30 DIAGNOSIS — Z79899 Other long term (current) drug therapy: Secondary | ICD-10-CM | POA: Insufficient documentation

## 2012-11-30 DIAGNOSIS — Z794 Long term (current) use of insulin: Secondary | ICD-10-CM | POA: Insufficient documentation

## 2012-11-30 DIAGNOSIS — F17200 Nicotine dependence, unspecified, uncomplicated: Secondary | ICD-10-CM | POA: Insufficient documentation

## 2012-11-30 DIAGNOSIS — N390 Urinary tract infection, site not specified: Secondary | ICD-10-CM

## 2012-11-30 DIAGNOSIS — Z8639 Personal history of other endocrine, nutritional and metabolic disease: Secondary | ICD-10-CM | POA: Insufficient documentation

## 2012-11-30 DIAGNOSIS — F411 Generalized anxiety disorder: Secondary | ICD-10-CM | POA: Insufficient documentation

## 2012-11-30 DIAGNOSIS — R112 Nausea with vomiting, unspecified: Secondary | ICD-10-CM | POA: Insufficient documentation

## 2012-11-30 DIAGNOSIS — E1129 Type 2 diabetes mellitus with other diabetic kidney complication: Secondary | ICD-10-CM | POA: Insufficient documentation

## 2012-11-30 DIAGNOSIS — Z8679 Personal history of other diseases of the circulatory system: Secondary | ICD-10-CM | POA: Insufficient documentation

## 2012-11-30 DIAGNOSIS — Z8742 Personal history of other diseases of the female genital tract: Secondary | ICD-10-CM | POA: Insufficient documentation

## 2012-11-30 DIAGNOSIS — Z3202 Encounter for pregnancy test, result negative: Secondary | ICD-10-CM | POA: Insufficient documentation

## 2012-11-30 LAB — URINALYSIS, ROUTINE W REFLEX MICROSCOPIC
Nitrite: NEGATIVE
Specific Gravity, Urine: 1.016 (ref 1.005–1.030)
Urobilinogen, UA: 0.2 mg/dL (ref 0.0–1.0)
pH: 7 (ref 5.0–8.0)

## 2012-11-30 LAB — CBC WITH DIFFERENTIAL/PLATELET
Basophils Absolute: 0.1 10*3/uL (ref 0.0–0.1)
Eosinophils Absolute: 0.5 10*3/uL (ref 0.0–0.7)
HCT: 31.8 % — ABNORMAL LOW (ref 36.0–46.0)
Lymphocytes Relative: 11 % — ABNORMAL LOW (ref 12–46)
MCHC: 31.8 g/dL (ref 30.0–36.0)
Monocytes Absolute: 0.5 10*3/uL (ref 0.1–1.0)
Neutro Abs: 10.3 10*3/uL — ABNORMAL HIGH (ref 1.7–7.7)
RDW: 21.1 % — ABNORMAL HIGH (ref 11.5–15.5)

## 2012-11-30 LAB — COMPREHENSIVE METABOLIC PANEL
Albumin: 3.5 g/dL (ref 3.5–5.2)
Alkaline Phosphatase: 68 U/L (ref 39–117)
BUN: 7 mg/dL (ref 6–23)
Calcium: 9.1 mg/dL (ref 8.4–10.5)
Creatinine, Ser: 0.4 mg/dL — ABNORMAL LOW (ref 0.50–1.10)
Potassium: 3.5 mEq/L (ref 3.5–5.1)
Total Protein: 7.8 g/dL (ref 6.0–8.3)

## 2012-11-30 LAB — LIPASE, BLOOD: Lipase: 50 U/L (ref 11–59)

## 2012-11-30 LAB — POCT I-STAT TROPONIN I: Troponin i, poc: 0 ng/mL (ref 0.00–0.08)

## 2012-11-30 MED ORDER — NITROFURANTOIN MONOHYD MACRO 100 MG PO CAPS: 100.0000 mg | ORAL_CAPSULE | Freq: Two times a day (BID) | ORAL | Status: DC

## 2012-11-30 MED ORDER — PROMETHAZINE HCL 25 MG PO TABS
25.0000 mg | ORAL_TABLET | Freq: Once | ORAL | Status: AC
  Administered 2012-11-30: 25 mg via ORAL
  Filled 2012-11-30: qty 1

## 2012-11-30 NOTE — ED Provider Notes (Signed)
Medical screening examination/treatment/procedure(s) were performed by non-physician practitioner and as supervising physician I was immediately available for consultation/collaboration.    Celene Kras, MD 11/30/12 [phone]

## 2012-11-30 NOTE — ED Notes (Addendum)
Patient presents to the ED today with c/o left flank pain and left upper quadrant pain and vomiting since last night. Pt estimates 6 emesis. Pt reports discharge today.  Pt reports painful urination.

## 2012-11-30 NOTE — ED Provider Notes (Signed)
History     CSN: [id number]  Arrival date & time 11/30/12  1358   First MD Initiated Contact with Patient 11/30/12 1504      Chief Complaint  Patient presents with  . Flank Pain    (Consider location/radiation/quality/duration/timing/severity/associated sxs/prior treatment) HPI  Pt to the ER with complaints of dysuria and left flank pressure. She has been seen in the ER > 13 times in the past 2 months for various pain concerns. She has a history of sickle cell trait but not the disease and comes in to the ER frequently complaining to be in crisis. Today she is not saying she is in crisis. She says since last night she has been unable to keep any food down or water and that she has been taking her percocet and suppository phenergan and that that is coming back up as well. She has a past medical history positive for hypertension, diabetes, headaches, allergies and gout. She denies fevers, weakness, chest pain, back pain, URI. nad vss  Past Medical History  Diagnosis Date  . Diabetes mellitus   . Anemia   . Headache(784.0)   . Seasonal allergies   . Abnormal Pap smear   . Gout   . Depression   . Anxiety     Past Surgical History  Procedure Laterality Date  . Cholecystectomy    . Cesarean section    . Esophagogastroduodenoscopy N/A 09/07/2012    Procedure: ESOPHAGOGASTRODUODENOSCOPY (EGD);  Surgeon: Charna Elizabeth, MD;  Location: [facility] ENDOSCOPY;  Service: Endoscopy;  Laterality: N/A;    Family History  Problem Relation Age of Onset  . Hypertension Mother   . Diabetes Mother   . Hypertension Maternal Aunt   . Diabetes Maternal Aunt   . Hypertension Maternal Uncle   . Diabetes Maternal Uncle   . Hypertension Maternal Grandmother     History  Substance Use Topics  . Smoking status: Current Every Day Smoker -- 0.50 packs/day    Types: Cigarettes  . Smokeless tobacco: Never Used  . Alcohol Use: No    OB History   Grav Para Term Preterm Abortions TAB SAB Ect Mult Living   8  8 7 1  0  0   7      Review of Systems  Gastrointestinal: Positive for nausea and vomiting.  Genitourinary: Positive for flank pain.  All other systems reviewed and are negative.    Allergies  Reglan; Ciprofloxacin; Morphine and related; and Zofran  Home Medications   Current Outpatient Rx  Name  Route  Sig  Dispense  Refill  . ALPRAZolam (XANAX) 1 MG tablet   Oral   Take 1 mg by mouth 2 (two) times daily.         . hydroxyurea (HYDREA) 500 MG capsule   Oral   Take 500 mg by mouth daily. May take with food to minimize GI side effects.         . insulin aspart (NOVOLOG) 100 UNIT/ML injection   Subcutaneous   Inject 10-20 Units into the skin See admin instructions. Sliding scale         . insulin glargine (LANTUS) 100 UNIT/ML injection   Subcutaneous   Inject 70 Units into the skin at bedtime.         Marland Kitchen oxyCODONE-acetaminophen (PERCOCET) 10-325 MG per tablet   Oral   Take 1 tablet by mouth every 6 (six) hours as needed for pain.         . nitrofurantoin, macrocrystal-monohydrate, (MACROBID) 100 MG  capsule   Oral   Take 1 capsule (100 mg total) by mouth 2 (two) times daily.   10 capsule   0     BP 144/76  Pulse 108  Temp(Src) 99.1 F (37.3 C) (Oral)  Resp 20  SpO2 100%  Physical Exam  Nursing note and vitals reviewed. Constitutional: She appears well-developed and well-nourished. No distress.  HENT:  Head: Normocephalic and atraumatic.  Eyes: Pupils are equal, round, and reactive to light.  Neck: Normal range of motion. Neck supple.  Cardiovascular: Normal rate and regular rhythm.   Pulmonary/Chest: Effort normal.  Abdominal: Soft. There is tenderness in the left upper quadrant and left lower quadrant. There is no rigidity, no rebound, no guarding, no CVA tenderness, no tenderness at McBurney's point and negative Murphy's sign.  Neurological: She is alert.  Skin: Skin is warm and dry.    ED Course  Procedures (including critical care  time)  Labs Reviewed  CBC WITH DIFFERENTIAL - Abnormal; Notable for the following:    WBC 12.8 (*)    RBC 5.46 (*)    Hemoglobin 10.1 (*)    HCT 31.8 (*)    MCV 58.2 (*)    MCH 18.5 (*)    RDW 21.1 (*)    All other components within normal limits  COMPREHENSIVE METABOLIC PANEL - Abnormal; Notable for the following:    Glucose, Bld 133 (*)    Creatinine, Ser 0.40 (*)    Total Bilirubin 0.2 (*)    All other components within normal limits  URINALYSIS, ROUTINE W REFLEX MICROSCOPIC - Abnormal; Notable for the following:    APPearance CLOUDY (*)    Leukocytes, UA SMALL (*)    All other components within normal limits  URINE MICROSCOPIC-ADD ON - Abnormal; Notable for the following:    Squamous Epithelial / LPF FEW (*)    All other components within normal limits  LIPASE, BLOOD  POCT PREGNANCY, URINE  POCT I-STAT TROPONIN I   No results found.   1. UTI (lower urinary tract infection)       MDM  Low suspicion for true pathology. No episodes of vomiting while in the ED. Will screen her with labs to ensure that she is at baseline.  Labs are WNL. Small UTI, will treat with Macrobid and send out urine cultures.  37 y.o.[name]'s evaluation in the Emergency Department is complete. It has been determined that no acute conditions requiring further emergency intervention are present at this time. The patient/guardian have been advised of the diagnosis and plan. We have discussed signs and symptoms that warrant return to the ED, such as changes or worsening in symptoms.  Vital signs are stable at discharge. Filed Vitals:   11/30/12 1405  BP: 144/76  Pulse: 108  Temp: 99.1 F (37.3 C)  Resp: 20    Patient/guardian has voiced understanding and agreed to follow-up with the PCP or specialist.       Dorthula Matas, PA-C 11/30/12 1540

## 2012-11-30 NOTE — Discharge Instructions (Signed)
Urinary Tract Infection  Urinary tract infections (UTIs) can develop anywhere along your urinary tract. Your urinary tract is your body's drainage system for removing wastes and extra water. Your urinary tract includes two kidneys, two ureters, a bladder, and a urethra. Your kidneys are a pair of bean-shaped organs. Each kidney is about the size of your fist. They are located below your ribs, one on each side of your spine.  CAUSES  Infections are caused by microbes, which are microscopic organisms, including fungi, viruses, and bacteria. These organisms are so small that they can only be seen through a microscope. Bacteria are the microbes that most commonly cause UTIs.  SYMPTOMS   Symptoms of UTIs may vary by age and gender of the patient and by the location of the infection. Symptoms in young women typically include a frequent and intense urge to urinate and a painful, burning feeling in the bladder or urethra during urination. Older women and men are more likely to be tired, shaky, and weak and have muscle aches and abdominal pain. A fever may mean the infection is in your kidneys. Other symptoms of a kidney infection include pain in your back or sides below the ribs, nausea, and vomiting.  DIAGNOSIS  To diagnose a UTI, your caregiver will ask you about your symptoms. Your caregiver also will ask to provide a urine sample. The urine sample will be tested for bacteria and white blood cells. White blood cells are made by your body to help fight infection.  TREATMENT   Typically, UTIs can be treated with medication. Because most UTIs are caused by a bacterial infection, they usually can be treated with the use of antibiotics. The choice of antibiotic and length of treatment depend on your symptoms and the type of bacteria causing your infection.  HOME CARE INSTRUCTIONS   If you were prescribed antibiotics, take them exactly as your caregiver instructs you. Finish the medication even if you feel better after you  have only taken some of the medication.   Drink enough water and fluids to keep your urine clear or pale yellow.   Avoid caffeine, tea, and carbonated beverages. They tend to irritate your bladder.   Empty your bladder often. Avoid holding urine for long periods of time.   Empty your bladder before and after sexual intercourse.   After a bowel movement, women should cleanse from front to back. Use each tissue only once.  SEEK MEDICAL CARE IF:    You have back pain.   You develop a fever.   Your symptoms do not begin to resolve within 3 days.  SEEK IMMEDIATE MEDICAL CARE IF:    You have severe back pain or lower abdominal pain.   You develop chills.   You have nausea or vomiting.   You have continued burning or discomfort with urination.  MAKE SURE YOU:    Understand these instructions.   Will watch your condition.   Will get help right away if you are not doing well or get worse.  Document Released: 03/21/2005 Document Revised: 12/11/2011 Document Reviewed: 07/20/2011  [employer] Patient Information 2014 [employer]

## 2012-12-01 ENCOUNTER — Emergency Department (HOSPITAL_COMMUNITY)
Admission: EM | Admit: 2012-12-01 | Discharge: 2012-12-01 | Disposition: A | Discharge status: Home / Self Care | Payer: Medicare Other | Attending: Emergency Medicine | Admitting: Emergency Medicine

## 2012-12-01 ENCOUNTER — Emergency Department (HOSPITAL_COMMUNITY): Payer: Medicare Other

## 2012-12-01 ENCOUNTER — Encounter (HOSPITAL_COMMUNITY): Payer: Self-pay | Admitting: Emergency Medicine

## 2012-12-01 DIAGNOSIS — Z79899 Other long term (current) drug therapy: Secondary | ICD-10-CM | POA: Insufficient documentation

## 2012-12-01 DIAGNOSIS — R109 Unspecified abdominal pain: Secondary | ICD-10-CM | POA: Insufficient documentation

## 2012-12-01 DIAGNOSIS — F329 Major depressive disorder, single episode, unspecified: Secondary | ICD-10-CM | POA: Insufficient documentation

## 2012-12-01 DIAGNOSIS — D259 Leiomyoma of uterus, unspecified: Secondary | ICD-10-CM

## 2012-12-01 DIAGNOSIS — Z87440 Personal history of urinary (tract) infections: Secondary | ICD-10-CM | POA: Insufficient documentation

## 2012-12-01 DIAGNOSIS — Z862 Personal history of diseases of the blood and blood-forming organs and certain disorders involving the immune mechanism: Secondary | ICD-10-CM | POA: Insufficient documentation

## 2012-12-01 DIAGNOSIS — F411 Generalized anxiety disorder: Secondary | ICD-10-CM | POA: Insufficient documentation

## 2012-12-01 DIAGNOSIS — R112 Nausea with vomiting, unspecified: Secondary | ICD-10-CM | POA: Insufficient documentation

## 2012-12-01 DIAGNOSIS — F3289 Other specified depressive episodes: Secondary | ICD-10-CM | POA: Insufficient documentation

## 2012-12-01 DIAGNOSIS — Z794 Long term (current) use of insulin: Secondary | ICD-10-CM | POA: Insufficient documentation

## 2012-12-01 DIAGNOSIS — J309 Allergic rhinitis, unspecified: Secondary | ICD-10-CM | POA: Insufficient documentation

## 2012-12-01 DIAGNOSIS — F17200 Nicotine dependence, unspecified, uncomplicated: Secondary | ICD-10-CM | POA: Insufficient documentation

## 2012-12-01 DIAGNOSIS — E119 Type 2 diabetes mellitus without complications: Secondary | ICD-10-CM | POA: Insufficient documentation

## 2012-12-01 DIAGNOSIS — M109 Gout, unspecified: Secondary | ICD-10-CM | POA: Insufficient documentation

## 2012-12-01 HISTORY — DX: Urinary tract infection, site not specified: N39.0

## 2012-12-01 MED ORDER — FENTANYL CITRATE 0.05 MG/ML IJ SOLN
50.0000 ug | Freq: Once | INTRAMUSCULAR | Status: AC
  Administered 2012-12-01: 50 ug via INTRAVENOUS
  Filled 2012-12-01: qty 2

## 2012-12-01 MED ORDER — PROMETHAZINE HCL 25 MG RE SUPP: 25.0000 mg | Freq: Four times a day (QID) | RECTAL | Status: DC | PRN

## 2012-12-01 MED ORDER — ONDANSETRON 4 MG PO TBDP: 8.0000 mg | ORAL_TABLET | Freq: Once | ORAL | Status: DC

## 2012-12-01 MED ORDER — SODIUM CHLORIDE 0.9 % IV BOLUS (SEPSIS)
1000.0000 mL | Freq: Once | INTRAVENOUS | Status: AC
  Administered 2012-12-01: 1000 mL via INTRAVENOUS

## 2012-12-01 MED ORDER — PROMETHAZINE HCL 25 MG RE SUPP
25.0000 mg | Freq: Four times a day (QID) | RECTAL | Status: DC | PRN
  Administered 2012-12-01: 25 mg via RECTAL
  Filled 2012-12-01: qty 1

## 2012-12-01 MED ORDER — KETOROLAC TROMETHAMINE 30 MG/ML IJ SOLN
30.0000 mg | Freq: Once | INTRAMUSCULAR | Status: AC
  Administered 2012-12-01: 30 mg via INTRAVENOUS
  Filled 2012-12-01: qty 1

## 2012-12-01 NOTE — ED Notes (Signed)
Pt given ginger ale.

## 2012-12-01 NOTE — ED Provider Notes (Signed)
History     CSN: [id number]  Arrival date & time 12/01/12  0149   First MD Initiated Contact with Patient 12/01/12 0304      Chief Complaint  Patient presents with  . Abdominal Pain    (Consider location/radiation/quality/duration/timing/severity/associated sxs/prior treatment) HPI Comments: Patient was seen yesterday for left flank pain, and dysuria.  She was diagnosed with UTI September antibiotic, and Phenergan tablets.  She, says she cannot take Phenergan tablets as she is growing in that she has a history of chronic nausea for which she normally has Phenergan suppositories at home.  She states she is out of the heat does have a history of a kidney stone x1.  None recently  Patient is a 37 y.o. female presenting with abdominal pain. The history is provided by the patient.  Abdominal Pain This is a recurrent problem. The current episode started yesterday. The problem occurs constantly. The problem has been gradually worsening. Associated symptoms include abdominal pain, nausea, urinary symptoms and vomiting. Pertinent negatives include no chest pain, chills, fever or myalgias.    Past Medical History  Diagnosis Date  . Diabetes mellitus   . Anemia   . Headache(784.0)   . Seasonal allergies   . Abnormal Pap smear   . Gout   . Depression   . Anxiety   . UTI (urinary tract infection)     Past Surgical History  Procedure Laterality Date  . Cholecystectomy    . Cesarean section    . Esophagogastroduodenoscopy N/A 09/07/2012    Procedure: ESOPHAGOGASTRODUODENOSCOPY (EGD);  Surgeon: Charna Elizabeth, MD;  Location: [facility] ENDOSCOPY;  Service: Endoscopy;  Laterality: N/A;    Family History  Problem Relation Age of Onset  . Hypertension Mother   . Diabetes Mother   . Hypertension Maternal Aunt   . Diabetes Maternal Aunt   . Hypertension Maternal Uncle   . Diabetes Maternal Uncle   . Hypertension Maternal Grandmother     History  Substance Use Topics  . Smoking status: Current  Every Day Smoker -- 0.50 packs/day    Types: Cigarettes  . Smokeless tobacco: Never Used  . Alcohol Use: No    OB History   Grav Para Term Preterm Abortions TAB SAB Ect Mult Living   8 8 7 1  0  0   7      Review of Systems  Constitutional: Negative for fever and chills.  Cardiovascular: Negative for chest pain.  Gastrointestinal: Positive for nausea, vomiting and abdominal pain.  Genitourinary: Positive for dysuria and flank pain. Negative for frequency and hematuria.  Musculoskeletal: Positive for back pain. Negative for myalgias.  All other systems reviewed and are negative.    Allergies  Reglan; Ciprofloxacin; Morphine and related; and Zofran  Home Medications   Current Outpatient Rx  Name  Route  Sig  Dispense  Refill  . ALPRAZolam (XANAX) 1 MG tablet   Oral   Take 1 mg by mouth 2 (two) times daily.         . hydroxyurea (HYDREA) 500 MG capsule   Oral   Take 500 mg by mouth daily. May take with food to minimize GI side effects.         . insulin aspart (NOVOLOG) 100 UNIT/ML injection   Subcutaneous   Inject 10-20 Units into the skin See admin instructions. Sliding scale         . insulin glargine (LANTUS) 100 UNIT/ML injection   Subcutaneous   Inject 70 Units into the skin at  bedtime.         . nitrofurantoin, macrocrystal-monohydrate, (MACROBID) 100 MG capsule   Oral   Take 1 capsule (100 mg total) by mouth 2 (two) times daily.   10 capsule   0   . oxyCODONE-acetaminophen (PERCOCET) 10-325 MG per tablet   Oral   Take 1 tablet by mouth every 6 (six) hours as needed for pain.         . promethazine (PHENERGAN) 25 MG suppository   Rectal   Place 1 suppository (25 mg total) rectally every 6 (six) hours as needed.   12 each   0     BP 154/75  Pulse 105  Temp(Src) 98.7 F (37.1 C) (Oral)  Resp 20  SpO2 100%  LMP 11/24/2012  Physical Exam  Nursing note and vitals reviewed. Constitutional: She is oriented to person, place, and time. She  appears well-developed and well-nourished.  HENT:  Head: Normocephalic.  Eyes: Pupils are equal, round, and reactive to light.  Neck: Normal range of motion.  Cardiovascular: Regular rhythm.  Tachycardia present.   Pulmonary/Chest: Effort normal.  Abdominal: Soft. Bowel sounds are normal. She exhibits no distension.  Musculoskeletal: Normal range of motion.  Neurological: She is alert and oriented to person, place, and time.  Skin: Skin is warm and dry.    ED Course  Procedures (including critical care time)  Labs Reviewed - No data to display Ct Abdomen Pelvis Wo Contrast  12/01/2012   *RADIOLOGY REPORT*  Clinical Data: The abdominal pain and left flank pain.  History of stones.  Nausea, vomiting, and pain.  CT ABDOMEN AND PELVIS WITHOUT CONTRAST  Technique:  Multidetector CT imaging of the abdomen and pelvis was performed following the standard protocol without intravenous contrast.  Comparison: 09/02/2012  Findings: Atelectasis or infiltration in the lung bases.  Small esophageal hiatal hernia.  There are multiple nonobstructing stones in the right kidney, largest in the mid pole measuring 7 mm diameter and stable since previous study.  There is no pyelocaliectasis or ureterectasis on either side.  No renal or ureteral stones demonstrated on the left.  Surgical absence of the gallbladder.  The unenhanced appearance of the liver, spleen, pancreas, adrenal glands, abdominal aorta, inferior vena cava, and retroperitoneal lymph nodes is unremarkable.  The stomach, small bowel, mostly decompressed without evidence of distension.  Stool filled colon without distension.  No free air or free fluid in the abdomen.  Abdominal wall musculature appears intact.  Pelvis:  Diffuse enlargement of the uterus with diameter measuring 7.8 cm.  This likely represents a fibroid uterus.  No abnormal adnexal masses.  The bladder wall is not thickened.  The appendix is normal.  No evidence of diverticulitis.  No free or  loculated pelvic fluid collections.  Small nodular soft tissue prominence adjacent to the inferior left rectus abdominus muscle is stable since the previous study may represent scarring.  Normal alignment of the lumbar vertebrae.  IMPRESSION: Nonobstructing intrarenal stones in the right kidney.  No obstructing ureteral stones demonstrated on either side.  Enlarged uterus likely representing fibroids.  No acute process suggested in the abdomen or pelvis.   Original Report Authenticated By: Burman Nieves, M.D.     1. Nausea & vomiting   2. Abdominal pain   3. Uterine fibroid       MDM  Renal stones , uterine fibroids tolerating PO will DC hoem with Rx for Phenergan Supp and instructions to FU with GYN  Arman Filter, NP 12/01/12 [phone]

## 2012-12-01 NOTE — ED Notes (Signed)
C/o L sided abd pain and L flank pain since Saturday.  Pt states she was seen in ED yesterday and diagnosed with UTI.  Reports continued nausea, vomiting, and pain.

## 2012-12-01 NOTE — ED Provider Notes (Signed)
Medical screening examination/treatment/procedure(s) were performed by non-physician practitioner and as supervising physician I was immediately available for consultation/collaboration.  Birgitta Uhlir M Shenna Brissette, MD 12/01/12 0622 

## 2012-12-29 ENCOUNTER — Emergency Department (HOSPITAL_COMMUNITY)
Admission: EM | Admit: 2012-12-29 | Discharge: 2012-12-29 | Disposition: A | Discharge status: Home / Self Care | Payer: Medicare Other | Attending: Emergency Medicine | Admitting: Emergency Medicine

## 2012-12-29 ENCOUNTER — Encounter (HOSPITAL_COMMUNITY): Payer: Self-pay | Admitting: Emergency Medicine

## 2012-12-29 DIAGNOSIS — Z3202 Encounter for pregnancy test, result negative: Secondary | ICD-10-CM | POA: Insufficient documentation

## 2012-12-29 DIAGNOSIS — F1110 Opioid abuse, uncomplicated: Secondary | ICD-10-CM | POA: Insufficient documentation

## 2012-12-29 DIAGNOSIS — F329 Major depressive disorder, single episode, unspecified: Secondary | ICD-10-CM | POA: Insufficient documentation

## 2012-12-29 DIAGNOSIS — G8929 Other chronic pain: Secondary | ICD-10-CM | POA: Insufficient documentation

## 2012-12-29 DIAGNOSIS — Z794 Long term (current) use of insulin: Secondary | ICD-10-CM | POA: Insufficient documentation

## 2012-12-29 DIAGNOSIS — D649 Anemia, unspecified: Secondary | ICD-10-CM | POA: Insufficient documentation

## 2012-12-29 DIAGNOSIS — Z87440 Personal history of urinary (tract) infections: Secondary | ICD-10-CM | POA: Insufficient documentation

## 2012-12-29 DIAGNOSIS — Z8709 Personal history of other diseases of the respiratory system: Secondary | ICD-10-CM | POA: Insufficient documentation

## 2012-12-29 DIAGNOSIS — F411 Generalized anxiety disorder: Secondary | ICD-10-CM | POA: Insufficient documentation

## 2012-12-29 DIAGNOSIS — R1110 Vomiting, unspecified: Secondary | ICD-10-CM

## 2012-12-29 DIAGNOSIS — F17200 Nicotine dependence, unspecified, uncomplicated: Secondary | ICD-10-CM | POA: Insufficient documentation

## 2012-12-29 DIAGNOSIS — Z79899 Other long term (current) drug therapy: Secondary | ICD-10-CM | POA: Insufficient documentation

## 2012-12-29 DIAGNOSIS — Z8639 Personal history of other endocrine, nutritional and metabolic disease: Secondary | ICD-10-CM | POA: Insufficient documentation

## 2012-12-29 DIAGNOSIS — M549 Dorsalgia, unspecified: Secondary | ICD-10-CM

## 2012-12-29 DIAGNOSIS — E1169 Type 2 diabetes mellitus with other specified complication: Secondary | ICD-10-CM | POA: Insufficient documentation

## 2012-12-29 DIAGNOSIS — F3289 Other specified depressive episodes: Secondary | ICD-10-CM | POA: Insufficient documentation

## 2012-12-29 DIAGNOSIS — Z862 Personal history of diseases of the blood and blood-forming organs and certain disorders involving the immune mechanism: Secondary | ICD-10-CM | POA: Insufficient documentation

## 2012-12-29 DIAGNOSIS — R109 Unspecified abdominal pain: Secondary | ICD-10-CM

## 2012-12-29 LAB — COMPREHENSIVE METABOLIC PANEL
ALT: 11 U/L (ref 0–35)
AST: 15 U/L (ref 0–37)
CO2: 22 mEq/L (ref 19–32)
Calcium: 8.6 mg/dL (ref 8.4–10.5)
Chloride: 102 mEq/L (ref 96–112)
GFR calc Af Amer: >90 mL/min (ref >90)
GFR calc non Af Amer: >90 mL/min (ref >90)
Glucose, Bld: 229 mg/dL — ABNORMAL HIGH (ref 70–99)
Sodium: 137 mEq/L (ref 135–145)
Total Bilirubin: 0.3 mg/dL (ref 0.3–1.2)

## 2012-12-29 LAB — URINE MICROSCOPIC-ADD ON

## 2012-12-29 LAB — URINALYSIS, ROUTINE W REFLEX MICROSCOPIC
Bilirubin Urine: NEGATIVE
Glucose, UA: 100 mg/dL — AB
Ketones, ur: NEGATIVE mg/dL
Protein, ur: 100 mg/dL — AB
Urobilinogen, UA: 0.2 mg/dL (ref 0.0–1.0)

## 2012-12-29 LAB — CBC WITH DIFFERENTIAL/PLATELET
Basophils Relative: 1 % (ref 0–1)
Eosinophils Absolute: 0.1 10*3/uL (ref 0.0–0.7)
Eosinophils Relative: 1 % (ref 0–5)
HCT: 28.5 % — ABNORMAL LOW (ref 36.0–46.0)
Hemoglobin: 8.6 g/dL — ABNORMAL LOW (ref 12.0–15.0)
Lymphocytes Relative: 8 % — ABNORMAL LOW (ref 12–46)
MCH: 17.8 pg — ABNORMAL LOW (ref 26.0–34.0)
MCHC: 30.2 g/dL (ref 30.0–36.0)
Monocytes Absolute: 0.6 10*3/uL (ref 0.1–1.0)
Neutro Abs: 9.8 10*3/uL — ABNORMAL HIGH (ref 1.7–7.7)
RBC: 4.82 MIL/uL (ref 3.87–5.11)

## 2012-12-29 LAB — KETONES, QUALITATIVE: Acetone, Bld: NEGATIVE

## 2012-12-29 MED ORDER — PROMETHAZINE HCL 25 MG/ML IJ SOLN
25.0000 mg | Freq: Once | INTRAMUSCULAR | Status: AC
  Administered 2012-12-29: 25 mg via INTRAVENOUS
  Filled 2012-12-29: qty 1

## 2012-12-29 MED ORDER — LORAZEPAM 2 MG/ML IJ SOLN
0.5000 mg | Freq: Once | INTRAMUSCULAR | Status: AC
  Administered 2012-12-29: 0.5 mg via INTRAVENOUS
  Filled 2012-12-29: qty 1

## 2012-12-29 MED ORDER — LORAZEPAM 2 MG/ML IJ SOLN
1.0000 mg | Freq: Once | INTRAMUSCULAR | Status: AC
  Administered 2012-12-29: 1 mg via INTRAVENOUS
  Filled 2012-12-29: qty 1

## 2012-12-29 MED ORDER — SODIUM CHLORIDE 0.9 % IV BOLUS (SEPSIS)
2000.0000 mL | Freq: Once | INTRAVENOUS | Status: AC
  Administered 2012-12-29: 2000 mL via INTRAVENOUS

## 2012-12-29 MED ORDER — CLONIDINE HCL 0.1 MG PO TABS
0.1000 mg | ORAL_TABLET | Freq: Once | ORAL | Status: AC
  Administered 2012-12-29: 0.1 mg via ORAL
  Filled 2012-12-29 (×2): qty 1

## 2012-12-29 NOTE — ED Notes (Signed)
Pt vomited right before getting in the car in front of the ER entrance. Charge RN made aware and pt and family made aware that patient just received nausea medicine.

## 2012-12-29 NOTE — ED Notes (Signed)
Pt constantly vomits on floor, regardless of holding a emesis bag in hand. Refuses to vomit in bag.

## 2012-12-29 NOTE — Progress Notes (Signed)
Updated EDP CM reviewed returned labs

## 2012-12-29 NOTE — ED Notes (Signed)
Son requested apple juice - offered tea or water and explained that juice is for pt only.

## 2012-12-29 NOTE — ED Notes (Signed)
Pt at Dr's lounge asking questions, pt advised to go to her room and the MD will be at bedside later.

## 2012-12-29 NOTE — ED Notes (Signed)
Pt continuously vomits in floor even when given emesis bag. Also ignores nurse. Child at bedside.

## 2012-12-29 NOTE — ED Provider Notes (Signed)
History    CSN: [id number] Arrival date & time 12/29/12  1533  First MD Initiated Contact with Patient 12/29/12 1620     Chief Complaint  Patient presents with  . Hyperglycemia  . Nausea  . Emesis   (Consider location/radiation/quality/duration/timing/severity/associated sxs/prior Treatment) HPI This 37 year old patient has chronic pain syndrome with chronic abdominal pain chronic back pain she has anemia but does not have sickle cell anemia despite patient's claims in the past, she does have diabetes, she has chronic narcotic use and/or abuse, she does not have a primary care physician does not have a pain management physician, her last hospitalization discharge summary recommended decreasing narcotic usage and recommended chronic pain management, it was felt the chronic narcotics may be exacerbating her chronic abdominal pain syndrome, patient has daily severe abdominal pain chronic nausea chronic left flank pain and has an exacerbation of her left flank pain abdominal pain and vomiting since last night with several nonbloody episodes of vomiting, she is no diarrhea no vaginal bleeding no dysuria no vaginal discharge no rash no fever no chest pain cough shortness breath no treatment prior to arrival, the last ED visits for the same symptoms were about a month ago both at the [facility] as well as [facility] ED according to the Pt.she denies any threats herself or others denies suicidal or homicidal ideation or hallucinations. She is no radiation of her back pain to her legs no weakness or numbness to her legs no change in bowel or bladder function. Pt wants IV Dilaudid for her chronic abdominal pain. Past Medical History  Diagnosis Date  . Diabetes mellitus   . Anemia   . Headache(784.0)   . Seasonal allergies   . Abnormal Pap smear   . Gout   . Depression   . Anxiety   . UTI (urinary tract infection)    Past Surgical History  Procedure Laterality Date  . Cholecystectomy     . Cesarean section    . Esophagogastroduodenoscopy N/A 09/07/2012    Procedure: ESOPHAGOGASTRODUODENOSCOPY (EGD);  Surgeon: Charna Elizabeth, MD;  Location: [facility] ENDOSCOPY;  Service: Endoscopy;  Laterality: N/A;   Family History  Problem Relation Age of Onset  . Hypertension Mother   . Diabetes Mother   . Hypertension Maternal Aunt   . Diabetes Maternal Aunt   . Hypertension Maternal Uncle   . Diabetes Maternal Uncle   . Hypertension Maternal Grandmother    History  Substance Use Topics  . Smoking status: Current Every Day Smoker -- 0.50 packs/day    Types: Cigarettes  . Smokeless tobacco: Never Used  . Alcohol Use: No   OB History   Grav Para Term Preterm Abortions TAB SAB Ect Mult Living   8 8 7 1  0  0   7     Review of Systems 10 Systems reviewed and are negative for acute change except as noted in the HPI. Allergies  Reglan; Morphine and related; and Zofran  Home Medications   Current Outpatient Rx  Name  Route  Sig  Dispense  Refill  . insulin aspart (NOVOLOG) 100 UNIT/ML injection   Subcutaneous   Inject 10-20 Units into the skin 3 (three) times daily before meals. Sliding scale         . ALPRAZolam (XANAX) 1 MG tablet   Oral   Take 1 mg by mouth 2 (two) times daily.         . hydroxyurea (HYDREA) 500 MG capsule   Oral  Take 500 mg by mouth daily. May take with food to minimize GI side effects.         . insulin glargine (LANTUS) 100 UNIT/ML injection   Subcutaneous   Inject 70 Units into the skin at bedtime.         . nitrofurantoin, macrocrystal-monohydrate, (MACROBID) 100 MG capsule   Oral   Take 1 capsule (100 mg total) by mouth 2 (two) times daily.   10 capsule   0   . oxyCODONE-acetaminophen (PERCOCET) 10-325 MG per tablet   Oral   Take 1 tablet by mouth every 6 (six) hours as needed for pain.         . promethazine (PHENERGAN) 25 MG suppository   Rectal   Place 1 suppository (25 mg total) rectally every 6 (six) hours as needed.   12  each   0    BP 163/67  Pulse 86  Temp(Src) 99.5 F (37.5 C) (Oral)  Resp 24  SpO2 100%  LMP 11/24/2012 Physical Exam  Nursing note and vitals reviewed. Constitutional:  Awake, alert, nontoxic appearance.  HENT:  Head: Atraumatic.  Eyes: Right eye exhibits no discharge. Left eye exhibits no discharge.  Neck: Neck supple.  Cardiovascular: Normal rate and regular rhythm.   No murmur heard. Pulmonary/Chest: Effort normal and breath sounds normal. No respiratory distress. She has no wheezes. She has no rales. She exhibits no tenderness.  Abdominal: Soft. Bowel sounds are normal. She exhibits no distension and no mass. There is tenderness. There is no rebound and no guarding.  Minimal diffuse abdominal tenderness without rebound  Genitourinary:  No CVA tenderness  Musculoskeletal: She exhibits no tenderness.  Baseline ROM, no obvious new focal weakness. No midline back tenderness the patient has mild left para lumbar soft tissue tenderness without rash noted.  Neurological:  Mental status and motor strength appears baseline for patient and situation.  Skin: No rash noted.  Psychiatric: She has a normal mood and affect.    ED Course  Procedures (including critical care time) Pt upset when advised IV narcotics not typically indicated in EDs for management of chronic abdominal pain exacerbations and she requested Dilaudid.[id number] Suspect contaminated urine, doubt UTI. Pt stable in ED with no significant deterioration in condition. Patient / Family / Caregiver informed of clinical course, understand medical decision-making process, and agree with plan. Labs Reviewed  GLUCOSE, CAPILLARY - Abnormal; Notable for the following:    Glucose-Capillary 210 (*)    All other components within normal limits  CBC WITH DIFFERENTIAL - Abnormal; Notable for the following:    WBC 11.5 (*)    Hemoglobin 8.6 (*)    HCT 28.5 (*)    MCV 59.1 (*)    MCH 17.8 (*)    RDW 20.7 (*)    Neutrophils  Relative % 85 (*)    Lymphocytes Relative 8 (*)    Neutro Abs 9.8 (*)    All other components within normal limits  COMPREHENSIVE METABOLIC PANEL - Abnormal; Notable for the following:    Potassium 3.4 (*)    Glucose, Bld 229 (*)    BUN 5 (*)    Creatinine, Ser 0.35 (*)    All other components within normal limits  URINALYSIS, ROUTINE W REFLEX MICROSCOPIC - Abnormal; Notable for the following:    APPearance CLOUDY (*)    Glucose, UA 100 (*)    Hgb urine dipstick LARGE (*)    Protein, ur 100 (*)    Leukocytes, UA MODERATE (*)  All other components within normal limits  URINE MICROSCOPIC-ADD ON - Abnormal; Notable for the following:    Squamous Epithelial / LPF MANY (*)    Bacteria, UA MANY (*)    All other components within normal limits  URINE CULTURE  LIPASE, BLOOD  KETONES, QUALITATIVE  POCT PREGNANCY, URINE   No results found. 1. Chronic abdominal pain   2. Chronic back pain   3. Chronic vomiting   4. Narcotic abuse     MDM  I doubt any other EMC precluding discharge at this time including, but not necessarily limited to the following:SBI, peritonitis.  Hurman Horn, MD 12/30/12 2041

## 2012-12-29 NOTE — ED Notes (Signed)
Pt refusing blood draw, states that she is in too much pain. Pt out in the hallway asking for pain medication. Explained that dr will not order any pain medication during her stay here.Wants to speak with Dr Fonnie Jarvis. MD notified. Family at bedside wanting to know pt condition. Explain that due to HIPPA cannot give out that information unless pt consent.

## 2012-12-29 NOTE — ED Notes (Signed)
Attempted to draw labs and patient stated "I told them I wasn't ready for my blood to be taken." RN made aware.

## 2012-12-29 NOTE — ED Notes (Signed)
Per EMS-pt c/o of hyperglycemia associated with nausea, vomiting yellow bile. Generalized pain, didn't eat today. NAD at this time. [id number]

## 2012-12-29 NOTE — ED Notes (Signed)
Pt refused vital signs, states that she cannot walk, although out in hallway c/o of pain. MD notified.

## 2012-12-29 NOTE — ED Notes (Signed)
Lab informed patient refusing labs

## 2012-12-29 NOTE — Progress Notes (Signed)
[facility] ED CM consulted by EDP, Bednar for pt with chronic n/v, pcp, abdominal pain and need for pain management.  Pt requesting dilaudid per EDP and voiced frustration when EDP will not provided Dilaudid IV. Pt without pcp. CM went to speak with pt Noted a sheet and blanket on the floor on the left side of the pt's bed.  Son at her bedside. CM discussed CM consult for pcp with the pt who did not respond when cm inquired who her pcp was. Pt sat up in bed and began to throw up clear yellow fluids over the side of the bed unto the sheets.  CMs offered cool cloth and emesis container but the pt would not use them.  Pt finished with dry heaves. Son at bedside requested EDP and wanted to know what is wrong with the pt Son reports he does not know the pcp and has recently arrived back to visit the pt.  Pt daughter arrived to the room and inquired how long the pt had to remain in the ED. CM informed her until EDP able to complete his evaluation and it would not benefit the pt to leave with continuous emesis.  CM left pt's room and later saw pt, son and daughter walking from ED room #7 to Rm #15 seeking EDP Pt inquired if CM saw EDP and she was informed CM had not. Pt returned to her room. [facility] staff states pt refused lab draw. CM spoke with pt with another daughter and young adolescent in the room. CM explained to pt that the EDP will not be able to complete the evaluation until labs drawn and lab results returned and review.  Pt stated she refused to allow lab draw "at the time" the labs staff came in previously.  CM stayed in pt room until labs draw and pt informed Cm would stay until labs drawn.  Pt stating her pcp is Dr Jeri Cos (neurologist) Cm confirmed with pt that she meant Dr Bascom Levels, neurologist of [facility].   She said "yes" She states this is the last MD she saw. Discussed the process of having EDP to review lab results when they are complete and reviewing them with pt and family Pt's daughter x 1 voiced  understanding.  Assisted pt to raise HOB. EDP, Bednar in the room to speak with the pt, son and daughters x 2 about elevated blood sugars.  Pt OOB in chair when CM left her room at 1815 without distress or further emesis noted

## 2012-12-29 NOTE — ED Notes (Signed)
[id number]> Expected date:<BR> Expected time:<BR> Means of arrival:<BR> Comments:<BR> EMS - Hyperglycemia ROOM 7

## 2012-12-29 NOTE — ED Notes (Signed)
Attempted to clean vomit off the floor and pick up emesis bag that pt had tossed across room.  Pt refused new emesis bag and stated, "didn't I tell you not to come back in my room?".  I apologized and will not return to room

## 2012-12-29 NOTE — ED Notes (Signed)
Pt noncompliant with care.  Unable to redirect at this time.  Cleaned vomit off floor and asked if pt needed comfort care. Rn and charge RN aware of situation

## 2012-12-31 LAB — URINE CULTURE: Colony Count: >=100000

## 2013-01-01 NOTE — Progress Notes (Signed)
ED Antimicrobial Stewardship Positive Culture Follow Up   [name] is an 37 y.o. female who presented to [facility] on 12/29/2012 with a chief complaint of hyperglycemia and emesis  Chief Complaint  Patient presents with  . Hyperglycemia  . Nausea  . Emesis    Recent Results (from the past 720 hour(s))  URINE CULTURE     Status: None   Collection Time    12/29/12  4:58 PM      Result Value Range Status   Specimen Description URINE, CLEAN CATCH   Final   Special Requests NONE   Final   Culture  Setup Time 12/30/2012 01:13   Final   Colony Count >=100,000 COLONIES/ML   Final   Culture     Final   Value: DIPHTHEROIDS(CORYNEBACTERIUM SPECIES)     Note: Standardized susceptibility testing for this organism is not available.   Report Status 12/31/2012 FINAL   Final    []  Treated with , organism resistant to prescribed antimicrobial []  Patient discharged originally without antimicrobial agent and treatment is now indicated  The patient grew diptheroids and had many squamous cells on the UA -- there were also no complaints of urinary symptoms -- no treatment deemed necessary.  New antibiotic prescription: No treatment deemed necessary  ED Provider: Marlon Pel, PA-C  Rolley Sims 01/01/2013, 10:13 AM Infectious Diseases Pharmacist Phone# [phone]

## 2013-01-02 NOTE — ED Notes (Signed)
+   Urine Would not treat-likely contaminant per Ellin Saba.

## 2013-03-05 ENCOUNTER — Inpatient Hospital Stay (EMERGENCY_DEPARTMENT_HOSPITAL)
Admission: AD | Admit: 2013-03-05 | Discharge: 2013-03-06 | Disposition: A | Discharge status: Home / Self Care | Payer: PRIVATE HEALTH INSURANCE | Source: Ambulatory Visit | Attending: Obstetrics & Gynecology | Admitting: Obstetrics & Gynecology

## 2013-03-05 ENCOUNTER — Encounter (HOSPITAL_COMMUNITY): Payer: Self-pay | Admitting: *Deleted

## 2013-03-05 DIAGNOSIS — R112 Nausea with vomiting, unspecified: Secondary | ICD-10-CM

## 2013-03-05 LAB — URINALYSIS, ROUTINE W REFLEX MICROSCOPIC
Glucose, UA: NEGATIVE mg/dL
Leukocytes, UA: NEGATIVE
Protein, ur: >300 mg/dL — AB
Specific Gravity, Urine: >1.03 — ABNORMAL HIGH (ref 1.005–1.030)

## 2013-03-05 LAB — COMPREHENSIVE METABOLIC PANEL
AST: 14 U/L (ref 0–37)
Albumin: 4.3 g/dL (ref 3.5–5.2)
Alkaline Phosphatase: 68 U/L (ref 39–117)
BUN: 12 mg/dL (ref 6–23)
Chloride: 103 mEq/L (ref 96–112)
Potassium: 3.7 mEq/L (ref 3.5–5.1)
Total Bilirubin: 0.3 mg/dL (ref 0.3–1.2)

## 2013-03-05 LAB — CBC
HCT: 31.6 % — ABNORMAL LOW (ref 36.0–46.0)
Platelets: 225 10*3/uL (ref 150–400)
RDW: 21.1 % — ABNORMAL HIGH (ref 11.5–15.5)
WBC: 10.7 10*3/uL — ABNORMAL HIGH (ref 4.0–10.5)

## 2013-03-05 LAB — URINE MICROSCOPIC-ADD ON

## 2013-03-05 MED ORDER — HYDROMORPHONE HCL PF 1 MG/ML IJ SOLN
1.0000 mg | Freq: Once | INTRAMUSCULAR | Status: AC
  Administered 2013-03-05: 1 mg via INTRAMUSCULAR
  Filled 2013-03-05: qty 1

## 2013-03-05 MED ORDER — KETOROLAC TROMETHAMINE 60 MG/2ML IM SOLN
60.0000 mg | Freq: Once | INTRAMUSCULAR | Status: AC
  Administered 2013-03-05: 60 mg via INTRAMUSCULAR
  Filled 2013-03-05: qty 2

## 2013-03-05 MED ORDER — PROMETHAZINE HCL 25 MG/ML IJ SOLN
25.0000 mg | Freq: Once | INTRAMUSCULAR | Status: AC
  Administered 2013-03-05: 25 mg via INTRAMUSCULAR
  Filled 2013-03-05: qty 1

## 2013-03-05 MED ORDER — LACTATED RINGERS IV BOLUS (SEPSIS)
1000.0000 mL | Freq: Once | INTRAVENOUS | Status: AC
  Administered 2013-03-06: 1000 mL via INTRAVENOUS

## 2013-03-05 NOTE — MAU Provider Note (Signed)
History     CSN: [id number]  Arrival date and time: 03/05/13 2030   First Provider Initiated Contact with Patient 03/05/13 2130      Chief Complaint  Patient presents with  . Nausea  . Emesis   Emesis     [name] is a 37 y.o. [phone] who presents today with nausea and vomiting since this morning. She is unsure if she has had a fever, but has also had body aches all day. She has had on episode of diarrhea as well.  She would also like to have her "boils" looked at. She states that she has a hx of HSV and MRSA and she has a "spot" on her labia. She states that the area is painful.   Past Medical History  Diagnosis Date  . Diabetes mellitus   . Anemia   . Headache(784.0)   . Seasonal allergies   . Abnormal Pap smear   . Gout   . Depression   . Anxiety   . UTI (urinary tract infection)     Past Surgical History  Procedure Laterality Date  . Cholecystectomy    . Cesarean section    . Esophagogastroduodenoscopy N/A 09/07/2012    Procedure: ESOPHAGOGASTRODUODENOSCOPY (EGD);  Surgeon: Charna Elizabeth, MD;  Location: [facility] ENDOSCOPY;  Service: Endoscopy;  Laterality: N/A;    Family History  Problem Relation Age of Onset  . Hypertension Mother   . Diabetes Mother   . Hypertension Maternal Aunt   . Diabetes Maternal Aunt   . Hypertension Maternal Uncle   . Diabetes Maternal Uncle   . Hypertension Maternal Grandmother     History  Substance Use Topics  . Smoking status: Current Every Day Smoker -- 0.50 packs/day    Types: Cigarettes  . Smokeless tobacco: Never Used  . Alcohol Use: No    Allergies:  Allergies  Allergen Reactions  . Reglan [Metoclopramide] Shortness Of Breath  . Morphine And Related Hives  . Zofran [Ondansetron Hcl] Hives and Nausea And Vomiting    Prescriptions prior to admission  Medication Sig Dispense Refill  . ALPRAZolam (XANAX) 1 MG tablet Take 1 mg by mouth 2 (two) times daily.      . hydroxyurea (HYDREA) 500 MG capsule Take 500 mg  by mouth daily. May take with food to minimize GI side effects.      . insulin aspart (NOVOLOG) 100 UNIT/ML injection Inject 10-20 Units into the skin 3 (three) times daily before meals. Sliding scale      . insulin glargine (LANTUS) 100 UNIT/ML injection Inject 70 Units into the skin at bedtime.      . nitrofurantoin, macrocrystal-monohydrate, (MACROBID) 100 MG capsule Take 1 capsule (100 mg total) by mouth 2 (two) times daily.  10 capsule  0  . oxyCODONE-acetaminophen (PERCOCET) 10-325 MG per tablet Take 1 tablet by mouth every 6 (six) hours as needed for pain.      . promethazine (PHENERGAN) 25 MG suppository Place 1 suppository (25 mg total) rectally every 6 (six) hours as needed.  12 each  0    Review of Systems  Gastrointestinal: Positive for vomiting.   Physical Exam   Blood pressure 140/63, pulse 79, temperature 98.8 F (37.1 C), temperature source Oral, resp. rate 20, height 5\' 6"  (1.676 m), last menstrual period 02/23/2013.  Physical Exam  Nursing note and vitals reviewed. Constitutional: She is oriented to person, place, and time. She appears well-developed and well-nourished. No distress.  Cardiovascular: Normal rate.   Respiratory: Effort  normal.  GI: Soft.  Genitourinary:  Small herpatic lesion on the left labia majora.   Neurological: She is alert and oriented to person, place, and time.  Skin: Skin is warm and dry.  Psychiatric: She has a normal mood and affect.    MAU Course  Procedures  Results for orders placed during the hospital encounter of 03/05/13 (from the past 24 hour(s))  URINALYSIS, ROUTINE W REFLEX MICROSCOPIC     Status: Abnormal   Collection Time    03/05/13  8:45 PM      Result Value Range   Color, Urine YELLOW  YELLOW   APPearance CLOUDY (*) CLEAR   Specific Gravity, Urine >1.030 (*) 1.005 - 1.030   pH 6.0  5.0 - 8.0   Glucose, UA NEGATIVE  NEGATIVE mg/dL   Hgb urine dipstick MODERATE (*) NEGATIVE   Bilirubin Urine NEGATIVE  NEGATIVE    Ketones, ur 15 (*) NEGATIVE mg/dL   Protein, ur >914 (*) NEGATIVE mg/dL   Urobilinogen, UA 0.2  0.0 - 1.0 mg/dL   Nitrite NEGATIVE  NEGATIVE   Leukocytes, UA NEGATIVE  NEGATIVE  URINE MICROSCOPIC-ADD ON     Status: Abnormal   Collection Time    03/05/13  8:45 PM      Result Value Range   Squamous Epithelial / LPF FEW (*) RARE   WBC, UA 0-2  <3 WBC/hpf   RBC / HPF 11-20  <3 RBC/hpf   Bacteria, UA FEW (*) RARE   Urine-Other MUCOUS PRESENT    URINE RAPID DRUG SCREEN (HOSP PERFORMED)     Status: None   Collection Time    03/05/13  8:45 PM      Result Value Range   Opiates NONE DETECTED  NONE DETECTED   Cocaine NONE DETECTED  NONE DETECTED   Benzodiazepines NONE DETECTED  NONE DETECTED   Amphetamines NONE DETECTED  NONE DETECTED   Tetrahydrocannabinol NONE DETECTED  NONE DETECTED   Barbiturates NONE DETECTED  NONE DETECTED  POCT PREGNANCY, URINE     Status: None   Collection Time    03/05/13  8:51 PM      Result Value Range   Preg Test, Ur NEGATIVE  NEGATIVE  CBC     Status: Abnormal   Collection Time    03/05/13  9:45 PM      Result Value Range   WBC 10.7 (*) 4.0 - 10.5 K/uL   RBC 5.42 (*) 3.87 - 5.11 MIL/uL   Hemoglobin 9.8 (*) 12.0 - 15.0 g/dL   HCT 78.2 (*) 95.6 - 21.3 %   MCV 58.3 (*) 78.0 - 100.0 fL   MCH 18.1 (*) 26.0 - 34.0 pg   MCHC 31.0  30.0 - 36.0 g/dL   RDW 08.6 (*) 57.8 - 46.9 %   Platelets 225  150 - 400 K/uL  COMPREHENSIVE METABOLIC PANEL     Status: Abnormal   Collection Time    03/05/13  9:45 PM      Result Value Range   Sodium 141  135 - 145 mEq/L   Potassium 3.7  3.5 - 5.1 mEq/L   Chloride 103  96 - 112 mEq/L   CO2 23  19 - 32 mEq/L   Glucose, Bld 264 (*) 70 - 99 mg/dL   BUN 12  6 - 23 mg/dL   Creatinine, Ser 6.29 (*) 0.50 - 1.10 mg/dL   Calcium 9.8  8.4 - 52.8 mg/dL   Total Protein 8.6 (*) 6.0 - 8.3 g/dL  Albumin 4.3  3.5 - 5.2 g/dL   AST 14  0 - 37 U/L   ALT 6  0 - 35 U/L   Alkaline Phosphatase 68  39 - 117 U/L   Total Bilirubin 0.3  0.3 -  1.2 mg/dL   GFR calc non Af Amer >90  >90 mL/min   GFR calc Af Amer >90  >90 mL/min   0059: C/W Dr. Freida Busman at [facility] would not treat the blood sugar any further at this time given the absence of fever, neck pain, photophobia. Treat N/V and have her return to [facility] or [facility] as needed if sx persist.   Assessment and Plan   1. Nausea and vomiting in adult    Possible kidney stone Will strain urine Return to [facility] or [facility] ED as needed  FU with Primary care provider as needed   Tawnya Crook 03/05/2013, 9:36 PM

## 2013-03-05 NOTE — MAU Note (Addendum)
Pt. Is unhappy that she isn't receiving her medication informed pt that she will receive her medication as soon as it comes up in the medication drawer.  Pt. Also wandering out in the hall and has been informed multiple times that she can't come in the hall because she is on contact precautions and can infect other pt.  Pt. Will go to her room for a few minutes then return to hallway. Will continue to monitor pt. And remind her she can't wander in the hallways.

## 2013-03-05 NOTE — MAU Note (Signed)
Pt started vomiting this morning at 8 am and hasn't been able to keep anything down all day. Pt. Is having stomach, back and leg pain.

## 2013-03-06 ENCOUNTER — Encounter (HOSPITAL_COMMUNITY): Payer: Self-pay | Admitting: *Deleted

## 2013-03-06 ENCOUNTER — Inpatient Hospital Stay (HOSPITAL_COMMUNITY)
Admission: EM | Admit: 2013-03-06 | Discharge: 2013-03-12 | DRG: 371 | Disposition: A | Discharge status: Home / Self Care | Payer: PRIVATE HEALTH INSURANCE | Attending: Internal Medicine | Admitting: Internal Medicine

## 2013-03-06 ENCOUNTER — Encounter (HOSPITAL_COMMUNITY): Payer: Self-pay

## 2013-03-06 ENCOUNTER — Emergency Department (HOSPITAL_COMMUNITY)
Admission: EM | Admit: 2013-03-06 | Discharge: 2013-03-06 | Disposition: A | Discharge status: Home / Self Care | Payer: PRIVATE HEALTH INSURANCE | Source: Home / Self Care | Attending: Emergency Medicine | Admitting: Emergency Medicine

## 2013-03-06 DIAGNOSIS — Z87440 Personal history of urinary (tract) infections: Secondary | ICD-10-CM | POA: Insufficient documentation

## 2013-03-06 DIAGNOSIS — F1110 Opioid abuse, uncomplicated: Secondary | ICD-10-CM

## 2013-03-06 DIAGNOSIS — E1149 Type 2 diabetes mellitus with other diabetic neurological complication: Secondary | ICD-10-CM

## 2013-03-06 DIAGNOSIS — Z794 Long term (current) use of insulin: Secondary | ICD-10-CM | POA: Insufficient documentation

## 2013-03-06 DIAGNOSIS — R109 Unspecified abdominal pain: Secondary | ICD-10-CM

## 2013-03-06 DIAGNOSIS — R112 Nausea with vomiting, unspecified: Secondary | ICD-10-CM | POA: Insufficient documentation

## 2013-03-06 DIAGNOSIS — F17200 Nicotine dependence, unspecified, uncomplicated: Secondary | ICD-10-CM | POA: Insufficient documentation

## 2013-03-06 DIAGNOSIS — Z79899 Other long term (current) drug therapy: Secondary | ICD-10-CM | POA: Insufficient documentation

## 2013-03-06 DIAGNOSIS — F3289 Other specified depressive episodes: Secondary | ICD-10-CM | POA: Insufficient documentation

## 2013-03-06 DIAGNOSIS — F411 Generalized anxiety disorder: Secondary | ICD-10-CM | POA: Insufficient documentation

## 2013-03-06 DIAGNOSIS — N852 Hypertrophy of uterus: Secondary | ICD-10-CM | POA: Diagnosis present

## 2013-03-06 DIAGNOSIS — E1142 Type 2 diabetes mellitus with diabetic polyneuropathy: Secondary | ICD-10-CM | POA: Diagnosis present

## 2013-03-06 DIAGNOSIS — D500 Iron deficiency anemia secondary to blood loss (chronic): Secondary | ICD-10-CM | POA: Diagnosis present

## 2013-03-06 DIAGNOSIS — K219 Gastro-esophageal reflux disease without esophagitis: Secondary | ICD-10-CM

## 2013-03-06 DIAGNOSIS — G8929 Other chronic pain: Secondary | ICD-10-CM

## 2013-03-06 DIAGNOSIS — F319 Bipolar disorder, unspecified: Secondary | ICD-10-CM | POA: Diagnosis present

## 2013-03-06 DIAGNOSIS — I10 Essential (primary) hypertension: Secondary | ICD-10-CM | POA: Diagnosis present

## 2013-03-06 DIAGNOSIS — E119 Type 2 diabetes mellitus without complications: Secondary | ICD-10-CM

## 2013-03-06 DIAGNOSIS — A0472 Enterocolitis due to Clostridium difficile, not specified as recurrent: Secondary | ICD-10-CM

## 2013-03-06 DIAGNOSIS — K859 Acute pancreatitis without necrosis or infection, unspecified: Secondary | ICD-10-CM

## 2013-03-06 DIAGNOSIS — E876 Hypokalemia: Secondary | ICD-10-CM

## 2013-03-06 DIAGNOSIS — Z9089 Acquired absence of other organs: Secondary | ICD-10-CM

## 2013-03-06 DIAGNOSIS — B373 Candidiasis of vulva and vagina: Secondary | ICD-10-CM

## 2013-03-06 DIAGNOSIS — Z8614 Personal history of Methicillin resistant Staphylococcus aureus infection: Secondary | ICD-10-CM

## 2013-03-06 DIAGNOSIS — D649 Anemia, unspecified: Secondary | ICD-10-CM

## 2013-03-06 DIAGNOSIS — R1084 Generalized abdominal pain: Secondary | ICD-10-CM | POA: Insufficient documentation

## 2013-03-06 DIAGNOSIS — D509 Iron deficiency anemia, unspecified: Secondary | ICD-10-CM | POA: Diagnosis present

## 2013-03-06 DIAGNOSIS — D259 Leiomyoma of uterus, unspecified: Secondary | ICD-10-CM | POA: Diagnosis present

## 2013-03-06 DIAGNOSIS — F329 Major depressive disorder, single episode, unspecified: Secondary | ICD-10-CM | POA: Insufficient documentation

## 2013-03-06 DIAGNOSIS — R1115 Cyclical vomiting syndrome unrelated to migraine: Secondary | ICD-10-CM

## 2013-03-06 DIAGNOSIS — M549 Dorsalgia, unspecified: Secondary | ICD-10-CM

## 2013-03-06 DIAGNOSIS — Z862 Personal history of diseases of the blood and blood-forming organs and certain disorders involving the immune mechanism: Secondary | ICD-10-CM | POA: Insufficient documentation

## 2013-03-06 DIAGNOSIS — B3731 Acute candidiasis of vulva and vagina: Secondary | ICD-10-CM

## 2013-03-06 HISTORY — DX: Bipolar disorder, unspecified: F31.9

## 2013-03-06 LAB — RAPID URINE DRUG SCREEN, HOSP PERFORMED
Barbiturates: NOT DETECTED
Cocaine: NOT DETECTED
Tetrahydrocannabinol: NOT DETECTED

## 2013-03-06 LAB — COMPREHENSIVE METABOLIC PANEL
ALT: 5 U/L (ref 0–35)
Albumin: 4 g/dL (ref 3.5–5.2)
Alkaline Phosphatase: 58 U/L (ref 39–117)
BUN: 18 mg/dL (ref 6–23)
Chloride: 100 mEq/L (ref 96–112)
GFR calc Af Amer: >90 mL/min (ref >90)
Glucose, Bld: 300 mg/dL — ABNORMAL HIGH (ref 70–99)
Potassium: 3.5 mEq/L (ref 3.5–5.1)
Sodium: 137 mEq/L (ref 135–145)
Total Bilirubin: 0.3 mg/dL (ref 0.3–1.2)
Total Protein: 8.6 g/dL — ABNORMAL HIGH (ref 6.0–8.3)

## 2013-03-06 LAB — CBC WITH DIFFERENTIAL/PLATELET
Basophils Relative: 0 % (ref 0–1)
Eosinophils Absolute: 0 10*3/uL (ref 0.0–0.7)
HCT: 29.3 % — ABNORMAL LOW (ref 36.0–46.0)
Hemoglobin: 9.2 g/dL — ABNORMAL LOW (ref 12.0–15.0)
Lymphocytes Relative: 8 % — ABNORMAL LOW (ref 12–46)
MCHC: 31.4 g/dL (ref 30.0–36.0)
Monocytes Relative: 5 % (ref 3–12)
Neutro Abs: 8.6 10*3/uL — ABNORMAL HIGH (ref 1.7–7.7)
Neutrophils Relative %: 87 % — ABNORMAL HIGH (ref 43–77)
RBC: 5.02 MIL/uL (ref 3.87–5.11)
WBC: 9.9 10*3/uL (ref 4.0–10.5)

## 2013-03-06 MED ORDER — PROMETHAZINE HCL 25 MG/ML IJ SOLN
25.0000 mg | Freq: Once | INTRAMUSCULAR | Status: AC
  Administered 2013-03-06: 25 mg via INTRAVENOUS
  Filled 2013-03-06: qty 1

## 2013-03-06 MED ORDER — SODIUM CHLORIDE 0.9 % IV SOLN: INTRAVENOUS | Status: DC

## 2013-03-06 MED ORDER — SODIUM CHLORIDE 0.9 % IV BOLUS (SEPSIS)
1000.0000 mL | Freq: Once | INTRAVENOUS | Status: AC
  Administered 2013-03-06: 1000 mL via INTRAVENOUS

## 2013-03-06 MED ORDER — HYDROMORPHONE HCL PF 1 MG/ML IJ SOLN
1.0000 mg | Freq: Once | INTRAMUSCULAR | Status: AC
  Administered 2013-03-06: 1 mg via INTRAVENOUS
  Filled 2013-03-06: qty 1

## 2013-03-06 MED ORDER — DICYCLOMINE HCL 10 MG/ML IM SOLN
20.0000 mg | Freq: Once | INTRAMUSCULAR | Status: AC
  Administered 2013-03-06: 20 mg via INTRAMUSCULAR
  Filled 2013-03-06: qty 2

## 2013-03-06 NOTE — MAU Note (Signed)
Pt discharged, but waiting for a ride home in MAU room.

## 2013-03-06 NOTE — ED Notes (Signed)
Pt presents with complaints of nausea/vomiting since this morning. Was seen at [facility] this morning and d/c this afternoon with rx for phenergan. Pt says she didn't pick the rx up because she already had some and she wasn't going to take it anyway.

## 2013-03-06 NOTE — ED Notes (Signed)
Pt complaint of nausea since yesterday morning.  Per EMS cbg [phone]

## 2013-03-06 NOTE — ED Notes (Signed)
Bed: WA07 Expected date: 03/06/13 Expected time: 11:17 PM Means of arrival: Ambulance Comments: 37 yo F  N/V/D

## 2013-03-06 NOTE — ED Provider Notes (Signed)
Medical screening examination/treatment/procedure(s) were performed by non-physician practitioner and as supervising physician I was immediately available for consultation/collaboration.  Olivia Mackie, MD 03/06/13 [phone]

## 2013-03-06 NOTE — ED Provider Notes (Signed)
CSN: [id number]     Arrival date & time 03/06/13  7829 History   First MD Initiated Contact with Patient 03/06/13 0602     Chief Complaint  Patient presents with  . Nausea  . Emesis   (Consider location/radiation/quality/duration/timing/severity/associated sxs/prior Treatment) HPI  37 year old female with history of diabetes presents for evaluations of nausea. Patient states since yesterday she has been very nauseous and has had more than 10 bouts of bilious vomitus. Symptom has been persistent accompanied with her usual abdominal back pain and pain in which she is currently being treated for MRSA infection with Bactrim for the past 3 weeks. Symptoms and to be persistence, nothing for it better or worse. No complaints of fever, chills, chest pain, shortness of breath, dysuria, hematuria, or rash. Denies any lightheadedness or dizziness. [facility] She was given IV fluid, Dilaudid, and Phenergan with some improvement but her symptoms return. Patient states she has had cholecystectomy. She denies any persistent diarrhea. She denies hematemesis, hematochezia, or melena.   Past Medical History  Diagnosis Date  . Diabetes mellitus   . Anemia   . Headache(784.0)   . Seasonal allergies   . Abnormal Pap smear   . Gout   . Depression   . Anxiety   . UTI (urinary tract infection)    Past Surgical History  Procedure Laterality Date  . Cholecystectomy    . Cesarean section    . Esophagogastroduodenoscopy N/A 09/07/2012    Procedure: ESOPHAGOGASTRODUODENOSCOPY (EGD);  Surgeon: Charna Elizabeth, MD;  Location: [facility] ENDOSCOPY;  Service: Endoscopy;  Laterality: N/A;   Family History  Problem Relation Age of Onset  . Hypertension Mother   . Diabetes Mother   . Hypertension Maternal Aunt   . Diabetes Maternal Aunt   . Hypertension Maternal Uncle   . Diabetes Maternal Uncle   . Hypertension Maternal Grandmother    History  Substance Use  Topics  . Smoking status: Current Every Day Smoker -- 0.50 packs/day    Types: Cigarettes  . Smokeless tobacco: Never Used  . Alcohol Use: No   OB History   Grav Para Term Preterm Abortions TAB SAB Ect Mult Living   8 8 7 1  0  0   7     Review of Systems  All other systems reviewed and are negative.    Allergies  Reglan and Zofran  Home Medications   Current Outpatient Rx  Name  Route  Sig  Dispense  Refill  . ALPRAZolam (XANAX) 1 MG tablet   Oral   Take 1 mg by mouth 2 (two) times daily.         . hydroxyurea (HYDREA) 500 MG capsule   Oral   Take 500 mg by mouth daily. May take with food to minimize GI side effects.         . insulin aspart (NOVOLOG) 100 UNIT/ML injection   Subcutaneous   Inject 10-20 Units into the skin 3 (three) times daily before meals. Sliding scale         . insulin glargine (LANTUS) 100 UNIT/ML injection   Subcutaneous   Inject 70 Units into the skin at bedtime.         . nitrofurantoin, macrocrystal-monohydrate, (MACROBID) 100 MG capsule   Oral   Take 1 capsule (100 mg total) by mouth 2 (two) times daily.   10 capsule   0   . oxyCODONE-acetaminophen (PERCOCET) 10-325 MG per tablet  Oral   Take 1 tablet by mouth every 6 (six) hours as needed for pain.         . promethazine (PHENERGAN) 25 MG suppository   Rectal   Place 1 suppository (25 mg total) rectally every 6 (six) hours as needed.   12 each   0    BP 157/60  Pulse 70  Temp(Src) 99.6 F (37.6 C) (Oral)  Resp 20  Ht 5\' 6"  (1.676 m)  Wt 200 lb (90.719 kg)  BMI 32.3 kg/m2  SpO2 100%  LMP 02/23/2013 Physical Exam  Nursing note and vitals reviewed. Constitutional: She appears well-developed and well-nourished. No distress.  Awake, alert, nontoxic appearance  HENT:  Head: Atraumatic.  Mouth is moist  Eyes: Conjunctivae are normal. Right eye exhibits no discharge. Left eye exhibits no discharge.  Neck: Neck supple.  Cardiovascular: Normal rate and regular  rhythm.   Pulmonary/Chest: Effort normal. No respiratory distress. She exhibits no tenderness.  Abdominal: Soft. There is tenderness (mild generalized tenderness without focal point tenderness no guarding and no rebound tenderness). There is no rebound.  Musculoskeletal: She exhibits no tenderness.  Baseline ROM, no obvious new focal weakness  Neurological:  Mental status and motor strength appears baseline for patient and situation  Skin: No rash noted.  Psychiatric: She has a normal mood and affect.    ED Course  Procedures (including critical care time)  8:03 AM Patient here with complaints of nausea and vomiting. She has been seen multiple times for this complaint. She was seen at [facility] yesterday for the same complaint. She is currently with stable normal vital sign. Non surgical abdomen, has prior hx of cholecystectomy.  Last BM this AM.  Doubt obstruction, doubt biliary disease.  Will obtain labs, IVF and sxs treatment.  Care discussed with attending.    8:56 AM Labs are reassuring.  Elevated CBG noted.  IVF given.  Pt continually requesting for dilaudid for pain medication, and also requesting food.  I do not think narcotic pain medication is an appropriate treatment for her chronic pain.  i offered toradol, pt declined.  At this time, pt is stable for discharge.  I strongly recommend f/u with PCP for further care.  Resources provided.  Return precaution discussed.    Labs Review Labs Reviewed  CBC WITH DIFFERENTIAL - Abnormal; Notable for the following:    Hemoglobin 9.2 (*)    HCT 29.3 (*)    MCV 58.4 (*)    MCH 18.3 (*)    RDW 21.0 (*)    Neutrophils Relative % 87 (*)    Lymphocytes Relative 8 (*)    Neutro Abs 8.6 (*)    All other components within normal limits  COMPREHENSIVE METABOLIC PANEL - Abnormal; Notable for the following:    Glucose, Bld 300 (*)    Total Protein 8.6 (*)    All other components within normal limits  LIPASE, BLOOD  URINALYSIS, ROUTINE W  REFLEX MICROSCOPIC   Imaging Review No results found.  MDM   1. Nausea & vomiting   2. Chronic abdominal pain    BP 162/68  Pulse 70  Temp(Src) 99.6 F (37.6 C) (Oral)  Resp 17  Ht 5\' 6"  (1.676 m)  Wt 200 lb (90.719 kg)  BMI 32.3 kg/m2  SpO2 97%  LMP 02/23/2013  I have reviewed nursing notes and vital signs.  I reviewed available ER/hospitalization records thought the EMR     Fayrene Helper, [place] 03/06/13 7829

## 2013-03-06 NOTE — ED Notes (Signed)
Report received, assumed care.  

## 2013-03-07 ENCOUNTER — Encounter (HOSPITAL_COMMUNITY): Payer: Self-pay | Admitting: *Deleted

## 2013-03-07 DIAGNOSIS — F17200 Nicotine dependence, unspecified, uncomplicated: Secondary | ICD-10-CM

## 2013-03-07 DIAGNOSIS — R112 Nausea with vomiting, unspecified: Secondary | ICD-10-CM | POA: Diagnosis present

## 2013-03-07 DIAGNOSIS — E876 Hypokalemia: Secondary | ICD-10-CM | POA: Diagnosis present

## 2013-03-07 DIAGNOSIS — E1149 Type 2 diabetes mellitus with other diabetic neurological complication: Secondary | ICD-10-CM

## 2013-03-07 LAB — URINALYSIS, ROUTINE W REFLEX MICROSCOPIC
Bilirubin Urine: NEGATIVE
Ketones, ur: NEGATIVE mg/dL
Protein, ur: 30 mg/dL — AB
Specific Gravity, Urine: 1.028 (ref 1.005–1.030)
Urobilinogen, UA: 1 mg/dL (ref 0.0–1.0)

## 2013-03-07 LAB — COMPREHENSIVE METABOLIC PANEL
ALT: 21 U/L (ref 0–35)
Alkaline Phosphatase: 61 U/L (ref 39–117)
BUN: 13 mg/dL (ref 6–23)
CO2: 27 mEq/L (ref 19–32)
Chloride: 99 mEq/L (ref 96–112)
GFR calc Af Amer: >90 mL/min (ref >90)
GFR calc non Af Amer: >90 mL/min (ref >90)
Glucose, Bld: 230 mg/dL — ABNORMAL HIGH (ref 70–99)
Potassium: 3.3 mEq/L — ABNORMAL LOW (ref 3.5–5.1)
Sodium: 136 mEq/L (ref 135–145)
Total Bilirubin: 0.5 mg/dL (ref 0.3–1.2)

## 2013-03-07 LAB — CBC
HCT: 30.3 % — ABNORMAL LOW (ref 36.0–46.0)
MCH: 18.3 pg — ABNORMAL LOW (ref 26.0–34.0)
MCV: 60.2 fL — ABNORMAL LOW (ref 78.0–100.0)
Platelets: 179 10*3/uL (ref 150–400)
RBC: 5.03 MIL/uL (ref 3.87–5.11)
WBC: 8.7 10*3/uL (ref 4.0–10.5)

## 2013-03-07 LAB — GLUCOSE, CAPILLARY
Glucose-Capillary: 250 mg/dL — ABNORMAL HIGH (ref 70–99)
Glucose-Capillary: 187 mg/dL — ABNORMAL HIGH (ref 70–99)

## 2013-03-07 LAB — CBC WITH DIFFERENTIAL/PLATELET
Hemoglobin: 9 g/dL — ABNORMAL LOW (ref 12.0–15.0)
Lymphocytes Relative: 13 % (ref 12–46)
Lymphs Abs: 1.3 10*3/uL (ref 0.7–4.0)
Monocytes Relative: 8 % (ref 3–12)
Neutrophils Relative %: 79 % — ABNORMAL HIGH (ref 43–77)
Platelets: 177 10*3/uL (ref 150–400)
RBC: 5 MIL/uL (ref 3.87–5.11)
WBC: 9.7 10*3/uL (ref 4.0–10.5)

## 2013-03-07 LAB — URINE MICROSCOPIC-ADD ON

## 2013-03-07 LAB — BASIC METABOLIC PANEL
CO2: 27 mEq/L (ref 19–32)
Calcium: 8.3 mg/dL — ABNORMAL LOW (ref 8.4–10.5)
Chloride: 103 mEq/L (ref 96–112)
Glucose, Bld: 228 mg/dL — ABNORMAL HIGH (ref 70–99)
Sodium: 139 mEq/L (ref 135–145)

## 2013-03-07 LAB — LIPID PANEL
HDL: 28 mg/dL — ABNORMAL LOW (ref >39)
LDL Cholesterol: 86 mg/dL (ref 0–99)
Total CHOL/HDL Ratio: 5.2 RATIO
VLDL: 31 mg/dL (ref 0–40)

## 2013-03-07 MED ORDER — SODIUM CHLORIDE 0.9 % IV BOLUS (SEPSIS)
1000.0000 mL | Freq: Once | INTRAVENOUS | Status: AC
  Administered 2013-03-07: 1000 mL via INTRAVENOUS

## 2013-03-07 MED ORDER — SODIUM CHLORIDE 0.9 % IV SOLN
INTRAVENOUS | Status: DC
  Administered 2013-03-07: 05:00:00 via INTRAVENOUS

## 2013-03-07 MED ORDER — ENOXAPARIN SODIUM 40 MG/0.4ML ~~LOC~~ SOLN
40.0000 mg | SUBCUTANEOUS | Status: DC
  Administered 2013-03-07 – 2013-03-09 (×2): 40 mg via SUBCUTANEOUS
  Filled 2013-03-07 (×6): qty 0.4

## 2013-03-07 MED ORDER — OXYCODONE HCL 5 MG PO TABS
5.0000 mg | ORAL_TABLET | ORAL | Status: DC | PRN
  Filled 2013-03-07: qty 1

## 2013-03-07 MED ORDER — PANTOPRAZOLE SODIUM 40 MG IV SOLR
40.0000 mg | Freq: Two times a day (BID) | INTRAVENOUS | Status: DC
  Administered 2013-03-07 – 2013-03-09 (×6): 40 mg via INTRAVENOUS
  Filled 2013-03-07 (×8): qty 40

## 2013-03-07 MED ORDER — PROMETHAZINE HCL 25 MG/ML IJ SOLN
25.0000 mg | Freq: Once | INTRAMUSCULAR | Status: AC
  Administered 2013-03-07: 25 mg via INTRAVENOUS
  Filled 2013-03-07: qty 1

## 2013-03-07 MED ORDER — ZOLPIDEM TARTRATE 5 MG PO TABS: 5.0000 mg | ORAL_TABLET | Freq: Every evening | ORAL | Status: DC | PRN

## 2013-03-07 MED ORDER — QUETIAPINE FUMARATE 300 MG PO TABS
300.0000 mg | ORAL_TABLET | Freq: Two times a day (BID) | ORAL | Status: DC
  Administered 2013-03-07 – 2013-03-12 (×11): 300 mg via ORAL
  Filled 2013-03-07 (×12): qty 1

## 2013-03-07 MED ORDER — HYDROMORPHONE HCL PF 1 MG/ML IJ SOLN
1.0000 mg | Freq: Once | INTRAMUSCULAR | Status: AC
  Administered 2013-03-07: 1 mg via INTRAVENOUS

## 2013-03-07 MED ORDER — ACETAMINOPHEN 325 MG PO TABS
650.0000 mg | ORAL_TABLET | Freq: Four times a day (QID) | ORAL | Status: DC | PRN
  Administered 2013-03-09: 650 mg via ORAL
  Filled 2013-03-07: qty 2

## 2013-03-07 MED ORDER — HYDROMORPHONE HCL PF 1 MG/ML IJ SOLN
0.5000 mg | INTRAMUSCULAR | Status: DC | PRN
  Administered 2013-03-07 – 2013-03-08 (×10): 1 mg via INTRAVENOUS
  Filled 2013-03-07 (×9): qty 1

## 2013-03-07 MED ORDER — INSULIN ASPART 100 UNIT/ML ~~LOC~~ SOLN: 0.0000 [IU] | Freq: Every day | SUBCUTANEOUS | Status: DC

## 2013-03-07 MED ORDER — PROMETHAZINE HCL 25 MG/ML IJ SOLN
25.0000 mg | Freq: Four times a day (QID) | INTRAMUSCULAR | Status: DC | PRN
  Administered 2013-03-07 – 2013-03-08 (×4): 25 mg via INTRAVENOUS
  Filled 2013-03-07 (×6): qty 1

## 2013-03-07 MED ORDER — INSULIN ASPART 100 UNIT/ML ~~LOC~~ SOLN
0.0000 [IU] | Freq: Three times a day (TID) | SUBCUTANEOUS | Status: DC
  Administered 2013-03-07 (×3): 3 [IU] via SUBCUTANEOUS

## 2013-03-07 MED ORDER — ALUM & MAG HYDROXIDE-SIMETH 200-200-20 MG/5ML PO SUSP: 30.0000 mL | Freq: Four times a day (QID) | ORAL | Status: DC | PRN

## 2013-03-07 MED ORDER — HYDROMORPHONE HCL PF 1 MG/ML IJ SOLN
1.0000 mg | Freq: Once | INTRAMUSCULAR | Status: AC
  Administered 2013-03-07: 1 mg via INTRAVENOUS
  Filled 2013-03-07: qty 1

## 2013-03-07 MED ORDER — HYDROMORPHONE HCL PF 1 MG/ML IJ SOLN
INTRAMUSCULAR | Status: AC
  Filled 2013-03-07: qty 1

## 2013-03-07 MED ORDER — HYDROMORPHONE HCL PF 1 MG/ML IJ SOLN
1.0000 mg | INTRAMUSCULAR | Status: DC | PRN
  Filled 2013-03-07 (×2): qty 1

## 2013-03-07 MED ORDER — INSULIN GLARGINE 100 UNIT/ML ~~LOC~~ SOLN
10.0000 [IU] | Freq: Every day | SUBCUTANEOUS | Status: DC
  Administered 2013-03-07 – 2013-03-09 (×3): 10 [IU] via SUBCUTANEOUS
  Filled 2013-03-07 (×3): qty 0.1

## 2013-03-07 MED ORDER — POTASSIUM CHLORIDE IN NACL 20-0.9 MEQ/L-% IV SOLN
INTRAVENOUS | Status: DC
  Administered 2013-03-07: 17:00:00 via INTRAVENOUS
  Administered 2013-03-07: 100 mL/h via INTRAVENOUS
  Administered 2013-03-08: 04:00:00 via INTRAVENOUS
  Filled 2013-03-07 (×5): qty 1000

## 2013-03-07 MED ORDER — ACETAMINOPHEN 650 MG RE SUPP: 650.0000 mg | Freq: Four times a day (QID) | RECTAL | Status: DC | PRN

## 2013-03-07 NOTE — ED Notes (Signed)
MD at bedside. 

## 2013-03-07 NOTE — Progress Notes (Signed)
[facility] PROGRESS NOTE  [name] ZOX:[id number] DOB: 03-19-1976 DOA: 03/06/2013 PCP: Jeri Cos, MD  Brief narrative: [name] is an 37 y.o. female with a PMH of diabetes, bipolar disorder, multiple hospitalizations for pain after telling her treating physicians and she had sickle cell anemia but ultimately a sickle cell screen was performed which was negative and gout who was admitted on 03/07/2013 with nausea and vomiting secondary to acute pancreatitis.  Assessment/Plan: Principal Problem:   Acute pancreatitis with abdominal pain and nausea/vomiting -Continue supportive care with IV fluids, antiemetics, and pain control. -Currently on a clear liquid diet.  Will make NPO since she still requires IV pain medication. -Lipase 122. -The patient is status post cholecystectomy. LFTs not elevated. Triglycerides 157.  -Given diarrhea, check GI pathogen panel is lipase elevation not entirely consistent with acute pancreatitis. Active Problems:   Bipolar -Continue Seroquel.   Hypokalemia -Add potassium to IV fluids.   DIABETES MELLITUS, TYPE II -Start insulin. Sugars appear to be in the 200-300s. Start Lantus 10 units daily along with moderate scale SSI.   Chronic microcytic anemia -Ferritin was 4 on 07/04/2012. These findings are consistent with iron deficiency. -Likely from menstrual losses/fibroids (enlarged uterus consistent with fibroids noted on CT scan of abdomen/pelvis done 12/01/2012).   TOBACCO ABUSE -Tobacco cessation counseling per nursing staff.  Code Status: Full. Family Communication: No family at the bedside. Disposition Plan: Home when stable.   Medical Consultants:  None.  Other Consultants:  None.  Anti-infectives:  None.  HPI/Subjective: [name] is very sleepy and continues to fall asleep when I examine her and tried to talk to her. She tells me she is having abdominal pain and nausea with vomiting. She's also had some  diarrhea this morning. Denies dysuria but reports hematuria. She denies alcohol use. Denies any recent changes to her medications or new over-the-counter medicines.  Objective: Filed Vitals:   03/06/13 2342 03/07/13 0411 03/07/13 0436  BP: 165/60 162/59 151/60  Pulse: 78 86 80  Temp: 99.9 F (37.7 C)  99.5 F (37.5 C)  TempSrc: Oral  Oral  Resp: 16 16 18   Height:   5\' 6"  (1.676 m)  Weight:   90.765 kg (200 lb 1.6 oz)  SpO2: 100% 99% 100%    Intake/Output Summary (Last 24 hours) at 03/07/13 0725 Last data filed at 03/07/13 0552  Gross per 24 hour  Intake    500 ml  Output    300 ml  Net    200 ml    Exam: Gen:  Lethargic Cardiovascular:  RRR, No M/R/G Respiratory:  Lungs CTAB Gastrointestinal:  Abdomen soft, NT/ND, + BS Extremities:  No C/E/C  Data Reviewed: Basic Metabolic Panel:  Recent Labs Lab 03/05/13 2145 03/06/13 0708 03/07/13 0125 03/07/13 0556  NA 141 137 136 139  K 3.7 3.5 3.3* 3.2*  CL 103 100 99 103  CO2 23 21 27 27   GLUCOSE 264* 300* 230* 228*  BUN 12 18 13 10   CREATININE 0.46* 0.50 0.45* 0.48*  CALCIUM 9.8 9.6 9.0 8.3*   GFR Estimated Creatinine Clearance: 108.2 ml/min (by C-G formula based on Cr of 0.48). Liver Function Tests:  Recent Labs Lab 03/05/13 2145 03/06/13 0708 03/07/13 0125  AST 14 16 47*  ALT 6 5 21   ALKPHOS 68 58 61  BILITOT 0.3 0.3 0.5  PROT 8.6* 8.6* 8.1  ALBUMIN 4.3 4.0 3.7    Recent Labs Lab 03/06/13 0708 03/07/13 0125  LIPASE 47 122*  CBC:  Recent Labs Lab 03/05/13 2145 03/06/13 0708 03/07/13 0125 03/07/13 0556  WBC 10.7* 9.9 9.7 8.7  NEUTROABS  --  8.6* 7.7  --   HGB 9.8* 9.2* 9.0* 9.2*  HCT 31.6* 29.3* 29.8* 30.3*  MCV 58.3* 58.4* 59.6* 60.2*  PLT 225 196 177 179    Procedures and Diagnostic Studies: No results found.  Scheduled Meds: . sodium chloride   Intravenous STAT  . enoxaparin (LOVENOX) injection  40 mg Subcutaneous Q24H  . pantoprazole (PROTONIX) IV  40 mg Intravenous Q12H  .  QUEtiapine  300 mg Oral BID   Continuous Infusions: . 0.9 % NaCl with KCl 20 mEq / L 100 mL/hr (03/07/13 0719)    Time spent: 25 minutes.   LOS: 1 day   Delray Reza  [facility] Pager [phone].   *Please note that the hospitalists switch teams on Wednesdays. Please call the flow manager at [phone] if you are having difficulty reaching the hospitalist taking care of this patient as she can update you and provide the most up-to-date pager number of provider caring for the patient. If 8PM-8AM, please contact night-coverage at [email or web address], password [facility]  03/07/2013, 7:25 AM

## 2013-03-07 NOTE — ED Provider Notes (Signed)
CSN: [id number]     Arrival date & time 03/06/13  2341 History   First MD Initiated Contact with Patient 03/07/13 0032     Chief Complaint  Patient presents with  . Emesis   (Consider location/radiation/quality/duration/timing/severity/associated sxs/prior Treatment) HPI Patient's 37 year old female with history of diabetes and chronic nausea and vomiting area and she's been seen several times in the last 2 days for vomiting. She was just released from [facility] emergency department and states that she vomited several times and was unable to keep any oral medication or food down. She then called EMS. Patient complains of diffuse abdominal pain which is unchanged. Denies constipation diarrhea. She denies blood in vomit or stool. She states the vomit is bilious in color. She is taking Bactrim for 3 weeks MRSA skin infection to the lower extremities.she denies increased redness or swelling at the sites. She denies fevers or chills. Patient requesting IV Dilaudid and Phenergan.  Past Medical History  Diagnosis Date  . Diabetes mellitus   . Anemia   . Headache(784.0)   . Seasonal allergies   . Abnormal Pap smear   . Gout   . Depression   . Anxiety   . UTI (urinary tract infection)    Past Surgical History  Procedure Laterality Date  . Cholecystectomy    . Cesarean section    . Esophagogastroduodenoscopy N/A 09/07/2012    Procedure: ESOPHAGOGASTRODUODENOSCOPY (EGD);  Surgeon: Charna Elizabeth, MD;  Location: [facility] ENDOSCOPY;  Service: Endoscopy;  Laterality: N/A;   Family History  Problem Relation Age of Onset  . Hypertension Mother   . Diabetes Mother   . Hypertension Maternal Aunt   . Diabetes Maternal Aunt   . Hypertension Maternal Uncle   . Diabetes Maternal Uncle   . Hypertension Maternal Grandmother    History  Substance Use Topics  . Smoking status: Current Every Day Smoker -- 0.50 packs/day    Types: Cigarettes  . Smokeless tobacco: Never Used  . Alcohol Use: No   OB History    Grav Para Term Preterm Abortions TAB SAB Ect Mult Living   8 8 7 1  0  0   7     Review of Systems  Constitutional: Negative for fever and chills.  Respiratory: Negative for cough and shortness of breath.   Cardiovascular: Negative for chest pain.  Gastrointestinal: Positive for nausea and vomiting. Negative for diarrhea, constipation and blood in stool.  Genitourinary: Negative for dysuria.  Musculoskeletal: Negative for myalgias and back pain.  Skin: Negative for rash and wound.  Neurological: Negative for dizziness, syncope, weakness, light-headedness, numbness and headaches.  All other systems reviewed and are negative.    Allergies  Reglan and Zofran  Home Medications   Current Outpatient Rx  Name  Route  Sig  Dispense  Refill  . alprazolam (XANAX) 2 MG tablet   Oral   Take 2 mg by mouth 3 (three) times daily. Take 2 mg by mouth 3 times daily.         Marland Kitchen amphetamine-dextroamphetamine (ADDERALL) 30 MG tablet   Oral   Take 30 mg by mouth 2 (two) times daily. Take 30 mg by mouth 2 times daily.         . insulin aspart (NOVOLOG) 100 UNIT/ML injection   Subcutaneous   Inject 10-20 Units into the skin 3 (three) times daily before meals. Sliding scale         . insulin glargine (LANTUS) 100 UNIT/ML injection   Subcutaneous   Inject  70 Units into the skin at bedtime.         Marland Kitchen oxyCODONE-acetaminophen (PERCOCET) 10-325 MG per tablet   Oral   Take 1 tablet by mouth every 6 (six) hours as needed for pain.         Marland Kitchen QUEtiapine (SEROQUEL) 300 MG tablet   Oral   Take 300 mg by mouth 2 (two) times daily. Take 300 mg by mouth 2 times daily.         . sitaGLIPtin (JANUVIA) 50 MG tablet   Oral   Take 50 mg by mouth 2 (two) times daily. Take 50 mg by mouth 2 times daily.         Marland Kitchen sulfamethoxazole-trimethoprim (BACTRIM DS) 800-160 MG per tablet   Oral   Take 2 tablets by mouth 2 (two) times daily. Take 2 tablets by mouth 2 times daily for 28 days.           BP 165/60  Pulse 78  Temp(Src) 99.9 F (37.7 C) (Oral)  Resp 16  SpO2 100%  LMP 02/23/2013 Physical Exam  Nursing note and vitals reviewed. Constitutional: She is oriented to person, place, and time. She appears well-developed and well-nourished. No distress.  Patient with 2 emesis bags at bedside with bilious-appearing vomit. no obvious blood.  HENT:  Head: Normocephalic and atraumatic.  Mouth/Throat: Oropharynx is clear and moist.  Eyes: EOM are normal. Pupils are equal, round, and reactive to light.  Neck: Normal range of motion. Neck supple.  Cardiovascular: Normal rate and regular rhythm.   Pulmonary/Chest: Effort normal and breath sounds normal. No respiratory distress. She has no wheezes. She has no rales.  Abdominal: Soft. Bowel sounds are normal. She exhibits no distension. There is tenderness (patient has diffuse abdominal tenderness without focality. No rebound or guarding). There is no rebound and no guarding.  Musculoskeletal: Normal range of motion. She exhibits no edema and no tenderness.  Neurological: She is alert and oriented to person, place, and time.  Patient is alert and oriented x3 with clear, goal oriented speech. Patient has 5/5 motor in all extremities. Sensation is intact to light touch.    Skin: Skin is warm and dry. No rash noted. No erythema.  Psychiatric: She has a normal mood and affect. Her behavior is normal.    ED Course  Procedures (including critical care time) Labs Review Labs Reviewed  CBC WITH DIFFERENTIAL - Abnormal; Notable for the following:    Hemoglobin 9.0 (*)    HCT 29.8 (*)    MCV 59.6 (*)    MCH 18.0 (*)    RDW 20.8 (*)    Neutrophils Relative % 79 (*)    All other components within normal limits  COMPREHENSIVE METABOLIC PANEL - Abnormal; Notable for the following:    Potassium 3.3 (*)    Glucose, Bld 230 (*)    Creatinine, Ser 0.45 (*)    AST 47 (*)    All other components within normal limits  LIPASE, BLOOD -  Abnormal; Notable for the following:    Lipase 122 (*)    All other components within normal limits  URINALYSIS, ROUTINE W REFLEX MICROSCOPIC - Abnormal; Notable for the following:    Color, Urine AMBER (*)    APPearance CLOUDY (*)    Glucose, UA 100 (*)    Hgb urine dipstick TRACE (*)    Protein, ur 30 (*)    Leukocytes, UA LARGE (*)    All other components within normal limits  URINE  MICROSCOPIC-ADD ON - Abnormal; Notable for the following:    Squamous Epithelial / LPF MANY (*)    Bacteria, UA MANY (*)    All other components within normal limits  URINE CULTURE  PREGNANCY, URINE   Imaging Review No results found.  MDM patient has likely exacerbation of her chronic symptoms. abdominal exam is nonacute. A repeat screening labs and IV fluids and IV pain and nausea medication and reassess   Patient with continued vomiting in the emergency department. Labs demonstrated an elevation in her lipase. Discussed with Dr. Lovell Sheehan of [facility]. Will admit to MedSurg floor.  Loren Racer, MD 03/07/13 [phone]

## 2013-03-07 NOTE — H&P (Signed)
[facility] History and Physical  DIMITRI DSOUZA ZOX:[id number] DOB: 1975-11-30 DOA: 03/06/2013  Referring physician: EDP PCP: Jeri Cos, MD  Specialists:   Chief Complaint:  Nausea and Vomiting   HPI: [name] is a 37 y.o. female who presents to the Ed due to 4 days of increased nausea and vomiting and epigastric ABD pain that radiates into her back.  She denies having any hematemesis, and denied having any constipation or diarrhea.   In the ED, she was evaluated and was found to have an Lipase level of 120.   She was referred for medical admission.      Review of Systems: The patient denies anorexia, fever, chills, headaches, weight loss,, vision loss, diplopia, dizziness, decreased hearing, rhinitis, hoarseness, chest pain, syncope, dyspnea on exertion, peripheral edema, balance deficits, cough, hemoptysis, diarrhea, constipation, hematemesis, melena, hematochezia, severe indigestion/heartburn, dysuria, hematuria, incontinence, muscle weakness, suspicious skin lesions, transient blindness, difficulty walking, depression, unusual weight change, abnormal bleeding, enlarged lymph nodes, angioedema, and breast masses.    Past Medical History  Diagnosis Date  . Diabetes mellitus   . Anemia   . Headache(784.0)   . Seasonal allergies   . Abnormal Pap smear   . Gout   . Depression   . Anxiety   . UTI (urinary tract infection)   . Bipolar 1 disorder     Past Surgical History  Procedure Laterality Date  . Cholecystectomy    . Cesarean section    . Esophagogastroduodenoscopy N/A 09/07/2012    Procedure: ESOPHAGOGASTRODUODENOSCOPY (EGD);  Surgeon: Charna Elizabeth, MD;  Location: [facility] ENDOSCOPY;  Service: Endoscopy;  Laterality: N/A;    Prior to Admission medications   Medication Sig Start Date End Date Taking? Authorizing Provider  alprazolam Prudy Feeler) 2 MG tablet Take 2 mg by mouth 3 (three) times daily. Take 2 mg by mouth 3 times daily.   Yes Historical Provider, MD   amphetamine-dextroamphetamine (ADDERALL) 30 MG tablet Take 30 mg by mouth 2 (two) times daily. Take 30 mg by mouth 2 times daily.   Yes Historical Provider, MD  insulin aspart (NOVOLOG) 100 UNIT/ML injection Inject 10-20 Units into the skin 3 (three) times daily before meals. Sliding scale   Yes Historical Provider, MD  insulin glargine (LANTUS) 100 UNIT/ML injection Inject 70 Units into the skin at bedtime.   Yes Historical Provider, MD  oxyCODONE-acetaminophen (PERCOCET) 10-325 MG per tablet Take 1 tablet by mouth every 6 (six) hours as needed for pain.   Yes Historical Provider, MD  QUEtiapine (SEROQUEL) 300 MG tablet Take 300 mg by mouth 2 (two) times daily. Take 300 mg by mouth 2 times daily.   Yes Historical Provider, MD  sitaGLIPtin (JANUVIA) 50 MG tablet Take 50 mg by mouth 2 (two) times daily. Take 50 mg by mouth 2 times daily.   Yes Historical Provider, MD  sulfamethoxazole-trimethoprim (BACTRIM DS) 800-160 MG per tablet Take 2 tablets by mouth 2 (two) times daily. Take 2 tablets by mouth 2 times daily for 28 days. 02/19/13 03/19/13 Yes Historical Provider, MD    Allergies  Allergen Reactions  . Reglan [Metoclopramide] Shortness Of Breath  . Zofran [Ondansetron Hcl] Hives and Nausea And Vomiting    Social History:  reports that she has been smoking Cigarettes.  She has been smoking about 0.50 packs per day. She has never used smokeless tobacco. She reports that she does not drink alcohol or use illicit drugs.     Family History  Problem Relation Age of Onset  .  Hypertension Mother   . Diabetes Mother   . Hypertension Maternal Aunt   . Diabetes Maternal Aunt   . Hypertension Maternal Uncle   . Diabetes Maternal Uncle   . Hypertension Maternal Grandmother      Physical Exam:  GEN:  Pleasant Obese   37 y.o. African American female  examined  and in no acute distress; cooperative with exam Filed Vitals:   03/06/13 2342 03/07/13 0411 03/07/13 0436  BP: 165/60 162/59 151/60   Pulse: 78 86 80  Temp: 99.9 F (37.7 C)  99.5 F (37.5 C)  TempSrc: Oral  Oral  Resp: 16 16 18   Height:   5\' 6"  (1.676 m)  Weight:   90.765 kg (200 lb 1.6 oz)  SpO2: 100% 99% 100%   Blood pressure 151/60, pulse 80, temperature 99.5 F (37.5 C), temperature source Oral, resp. rate 18, height 5\' 6"  (1.676 m), weight 90.765 kg (200 lb 1.6 oz), last menstrual period 02/23/2013, SpO2 100.00%. PSYCH: She is alert and oriented x4; does not appear anxious does not appear depressed; affect is normal HEENT: Normocephalic and Atraumatic, Mucous membranes pink; PERRLA; EOM intact; Fundi:  Benign;  No scleral icterus, Nares: Patent, Oropharynx: Clear, Edentulous or Fair Dentition, Neck:  FROM, no cervical lymphadenopathy nor thyromegaly or carotid bruit; no JVD; Breasts:: Not examined CHEST WALL: No tenderness CHEST: Normal respiration, clear to auscultation bilaterally HEART: Regular rate and rhythm; no murmurs rubs or gallops BACK: No kyphosis or scoliosis; no CVA tenderness ABDOMEN: Positive Bowel Sounds, soft mildly tender in the Epigastrium; no masses, no organomegaly, no pannus; no intertriginous candida. Rectal Exam: Not done EXTREMITIES: No cyanosis, clubbing or edema; no ulcerations. Genitalia: not examined PULSES: 2+ and symmetric SKIN: Normal hydration no rash or ulceration CNS: Cranial nerves 2-12 grossly intact no focal neurologic deficit    Labs on Admission:  Basic Metabolic Panel:  Recent Labs Lab 03/05/13 2145 03/06/13 0708 03/07/13 0125  NA 141 137 136  K 3.7 3.5 3.3*  CL 103 100 99  CO2 23 21 27   GLUCOSE 264* 300* 230*  BUN 12 18 13   CREATININE 0.46* 0.50 0.45*  CALCIUM 9.8 9.6 9.0   Liver Function Tests:  Recent Labs Lab 03/05/13 2145 03/06/13 0708 03/07/13 0125  AST 14 16 47*  ALT 6 5 21   ALKPHOS 68 58 61  BILITOT 0.3 0.3 0.5  PROT 8.6* 8.6* 8.1  ALBUMIN 4.3 4.0 3.7    Recent Labs Lab 03/06/13 0708 03/07/13 0125  LIPASE 47 122*   No results  found for this basename: AMMONIA,  in the last 168 hours CBC:  Recent Labs Lab 03/05/13 2145 03/06/13 0708 03/07/13 0125  WBC 10.7* 9.9 9.7  NEUTROABS  --  8.6* 7.7  HGB 9.8* 9.2* 9.0*  HCT 31.6* 29.3* 29.8*  MCV 58.3* 58.4* 59.6*  PLT 225 196 177   Cardiac Enzymes: No results found for this basename: CKTOTAL, CKMB, CKMBINDEX, TROPONINI,  in the last 168 hours  BNP (last 3 results) No results found for this basename: PROBNP,  in the last 8760 hours CBG: No results found for this basename: GLUCAP,  in the last 168 hours  Radiological Exams on Admission: No results found.     Assessment/Plan Principal Problem:   Acute pancreatitis Active Problems:   Abdominal pain   Nausea and vomiting   DIABETES MELLITUS, TYPE II   DIABETIC PERIPHERAL NEUROPATHY   ANEMIA-NOS   TOBACCO ABUSE  1.  Acute Pancreatitis- IV dilaudid for pain, PRN Phenergan for nausea and vomiting, and IVFs.     2. ABD Pain and Nausea and Vomiting- due to #1-  See Rx in #1.    3. DM2-  SSI coverage for now, hold lantus due to decreased PO intake and check glucose levels q 4 hours, check HbA1C this AM.    4.  Anemia- Microcytic,  Most likely due to chronic blood loss, from Menorrhagia, however may also be due to GI Loss, In the past has    seen GI Dr. Loreta Ave and had a Normal Upper Endoscopy with findings of a small hiatal Hernia in 08/2012.      5.  Tobacco- counseled about tobacco Cessation , Nicotine patch while inpatient.    6.  DVT prophylaxis with Lovenox.        Code Status:      FULL CODE Family Communication:    No Family Present Disposition Plan:       Inpatient   Time spent:  81 Minutes  Ron Parker [facility] Pager [phone]  If 7PM-7AM, please contact night-coverage [email or web address] Password [email or web address] 03/07/2013, 5:03 AM

## 2013-03-08 ENCOUNTER — Inpatient Hospital Stay (HOSPITAL_COMMUNITY): Payer: PRIVATE HEALTH INSURANCE

## 2013-03-08 ENCOUNTER — Encounter (HOSPITAL_COMMUNITY): Payer: Self-pay | Admitting: Radiology

## 2013-03-08 LAB — MRSA PCR SCREENING: MRSA by PCR: NEGATIVE

## 2013-03-08 LAB — GLUCOSE, CAPILLARY
Glucose-Capillary: 153 mg/dL — ABNORMAL HIGH (ref 70–99)
Glucose-Capillary: 152 mg/dL — ABNORMAL HIGH (ref 70–99)

## 2013-03-08 LAB — URINE CULTURE: Colony Count: 85000

## 2013-03-08 LAB — BASIC METABOLIC PANEL
BUN: 10 mg/dL (ref 6–23)
Creatinine, Ser: 0.52 mg/dL (ref 0.50–1.10)
GFR calc Af Amer: >90 mL/min (ref >90)
GFR calc non Af Amer: >90 mL/min (ref >90)
Potassium: 3.4 mEq/L — ABNORMAL LOW (ref 3.5–5.1)

## 2013-03-08 LAB — LIPASE, BLOOD: Lipase: 133 U/L — ABNORMAL HIGH (ref 11–59)

## 2013-03-08 MED ORDER — IOHEXOL 300 MG/ML  SOLN
50.0000 mL | Freq: Once | INTRAMUSCULAR | Status: AC | PRN
  Administered 2013-03-08: 50 mL via ORAL

## 2013-03-08 MED ORDER — INSULIN ASPART 100 UNIT/ML ~~LOC~~ SOLN
0.0000 [IU] | SUBCUTANEOUS | Status: DC
  Administered 2013-03-08: 5 [IU] via SUBCUTANEOUS
  Administered 2013-03-08 (×3): 3 [IU] via SUBCUTANEOUS
  Administered 2013-03-09 (×2): 5 [IU] via SUBCUTANEOUS
  Administered 2013-03-09: 3 [IU] via SUBCUTANEOUS

## 2013-03-08 MED ORDER — PROMETHAZINE HCL 25 MG/ML IJ SOLN
25.0000 mg | INTRAMUSCULAR | Status: DC | PRN
  Administered 2013-03-08 – 2013-03-12 (×24): 25 mg via INTRAVENOUS
  Filled 2013-03-08 (×23): qty 1

## 2013-03-08 MED ORDER — POTASSIUM CHLORIDE IN NACL 40-0.9 MEQ/L-% IV SOLN
INTRAVENOUS | Status: DC
  Administered 2013-03-08 – 2013-03-10 (×6): via INTRAVENOUS
  Administered 2013-03-11: 100 mL/h via INTRAVENOUS
  Administered 2013-03-11 – 2013-03-12 (×2): via INTRAVENOUS
  Filled 2013-03-08 (×13): qty 1000

## 2013-03-08 MED ORDER — IOHEXOL 300 MG/ML  SOLN
100.0000 mL | Freq: Once | INTRAMUSCULAR | Status: AC | PRN
  Administered 2013-03-08: 100 mL via INTRAVENOUS

## 2013-03-08 MED ORDER — HYDROMORPHONE HCL PF 1 MG/ML IJ SOLN
0.5000 mg | INTRAMUSCULAR | Status: DC | PRN
  Administered 2013-03-08 – 2013-03-09 (×10): 1 mg via INTRAVENOUS
  Filled 2013-03-08 (×10): qty 1

## 2013-03-08 NOTE — Progress Notes (Addendum)
[facility] PROGRESS NOTE  [name] XBJ:[id number] DOB: 22-Oct-1975 DOA: 03/06/2013 PCP: Jeri Cos, MD  Brief narrative: [name] is an 37 y.o. female with a PMH of diabetes, bipolar disorder, multiple hospitalizations for pain after telling her treating physicians and she had sickle cell anemia but ultimately a sickle cell screen was performed which was negative and gout who was admitted on 03/07/2013 with nausea and vomiting secondary to acute pancreatitis. During the course of her hospital stay, and GI pathogen panel was sent and turned up positive for C. difficile. She notes that she has been on multiple courses of antibiotics including clindamycin for treatment of recurrent vulvar boils and has a history of MRSA taken from a culture of one of these boils. She is now on oral Flagyl.  Assessment/Plan: Principal Problem:   Acute pancreatitis with abdominal pain and nausea/vomiting -Continue supportive care with IV fluids, antiemetics, and pain control. -Currently NPO. Will advance to bland diet. -Trigger for pancreatitis is likely from C. difficile/infection. -The patient is status post cholecystectomy. LFTs not elevated. Triglycerides 157.  -Given diarrhea, check GI pathogen panel is lipase elevation not entirely consistent with acute pancreatitis. GI pathogen panel positive for Clostridium difficile. -CT of the abdomen and pelvis shows mild pancreatic inflammation. No mention of colitis. Active Problems:   Vaginal candidiasis -Diflucan 150 mg by mouth daily x3 days.   History of MRSA with recurrent vulvar abscesses -Will decontaminate with Bactroban intranasally and chlorhexidine scrubs daily. -Would send home on intranasal Bactroban and Hibiclens soap for weekly showers to try and prevent re\re colonization.   Bipolar -Continue Seroquel.   Hypokalemia -Continue to replete with potassium in IV fluids.   DIABETES MELLITUS, TYPE II -Currently on Lantus 10 units  daily along with moderate scale SSI. -CBGs 149-250.    Chronic microcytic anemia -Ferritin was 4 on 07/04/2012. These findings are consistent with iron deficiency. -Likely from menstrual losses/fibroids (enlarged uterus consistent with fibroids noted on CT scan of abdomen/pelvis done 12/01/2012).   TOBACCO ABUSE -Tobacco cessation counseling per nursing staff.  Code Status: Full. Family Communication: No family at the bedside. Disposition Plan: Home when stable.   Medical Consultants:  None.  Other Consultants:  None.  Anti-infectives:  None.  HPI/Subjective: Elwyn Reach had 2 loose stools overnight. Had some nausea/vomiting yesterday. Pain continues to be midepigastric.  Objective: Filed Vitals:   03/07/13 1425 03/07/13 1745 03/07/13 2127 03/08/13 0530  BP: 136/74 136/62 135/72 128/62  Pulse: 88 88 85 69  Temp: 99.3 F (37.4 C) 99.3 F (37.4 C) 99.5 F (37.5 C) 98 F (36.7 C)  TempSrc: Oral Oral Oral Oral  Resp: 20 20 16 16   Height:      Weight:    90.946 kg (200 lb 8 oz)  SpO2: 100% 100% 100% 100%    Intake/Output Summary (Last 24 hours) at 03/08/13 0740 Last data filed at 03/08/13 0500  Gross per 24 hour  Intake   1200 ml  Output   1250 ml  Net    -50 ml    Exam: Gen:  Lethargic Cardiovascular:  RRR, No M/R/G Respiratory:  Lungs CTAB Gastrointestinal:  Abdomen soft, diffusely tender, + BS Extremities:  No C/E/C  Data Reviewed: Basic Metabolic Panel:  Recent Labs Lab 03/05/13 2145 03/06/13 0708 03/07/13 0125 03/07/13 0556 03/08/13 0355  NA 141 137 136 139 135  K 3.7 3.5 3.3* 3.2* 3.4*  CL 103 100 99 103 102  CO2 23 21 27 27  24  GLUCOSE 264* 300* 230* 228* 181*  BUN 12 18 13 10 10   CREATININE 0.46* 0.50 0.45* 0.48* 0.52  CALCIUM 9.8 9.6 9.0 8.3* 8.8   GFR Estimated Creatinine Clearance: 108.2 ml/min (by C-G formula based on Cr of 0.52). Liver Function Tests:  Recent Labs Lab 03/05/13 2145 03/06/13 0708 03/07/13 0125  AST 14 16  47*  ALT 6 5 21   ALKPHOS 68 58 61  BILITOT 0.3 0.3 0.5  PROT 8.6* 8.6* 8.1  ALBUMIN 4.3 4.0 3.7    Recent Labs Lab 03/06/13 0708 03/07/13 0125 03/08/13 0355  LIPASE 47 122* 133*   CBC:  Recent Labs Lab 03/05/13 2145 03/06/13 0708 03/07/13 0125 03/07/13 0556  WBC 10.7* 9.9 9.7 8.7  NEUTROABS  --  8.6* 7.7  --   HGB 9.8* 9.2* 9.0* 9.2*  HCT 31.6* 29.3* 29.8* 30.3*  MCV 58.3* 58.4* 59.6* 60.2*  PLT 225 196 177 179   CBG:  Recent Labs Lab 03/07/13 0904 03/07/13 1200 03/07/13 1719 03/07/13 2125  GLUCAP 250* 187* 156* 149*   Lipid Profile  Recent Labs  03/07/13 0600  CHOL 145  HDL 28*  LDLCALC 86  TRIG 161*  CHOLHDL 5.2    Procedures and Diagnostic Studies: No results found.  Scheduled Meds: . enoxaparin (LOVENOX) injection  40 mg Subcutaneous Q24H  . insulin aspart  0-15 Units Subcutaneous TID WC  . insulin aspart  0-5 Units Subcutaneous QHS  . insulin glargine  10 Units Subcutaneous Daily  . pantoprazole (PROTONIX) IV  40 mg Intravenous Q12H  . QUEtiapine  300 mg Oral BID   Continuous Infusions: . 0.9 % NaCl with KCl 20 mEq / L 100 mL/hr at 03/08/13 0404    Time spent: 35 minutes with > 50% of time discussing current diagnostic test results, clinical impression and plan of care.   LOS: 2 days   RAMA,CHRISTINA  [facility] Pager [phone].   *Please note that the hospitalists switch teams on Wednesdays. Please call the flow manager at [phone] if you are having difficulty reaching the hospitalist taking care of this patient as she can update you and provide the most up-to-date pager number of provider caring for the patient. If 8PM-8AM, please contact night-coverage at [email or web address], password [facility]  03/08/2013, 7:40 AM

## 2013-03-09 LAB — GI PATHOGEN PANEL BY PCR, STOOL
C difficile toxin A/B: POSITIVE
Campylobacter by PCR: NEGATIVE
Cryptosporidium by PCR: NEGATIVE
E coli (ETEC) LT/ST: NEGATIVE
E coli (STEC): NEGATIVE
E coli 0157 by PCR: NEGATIVE
G lamblia by PCR: NEGATIVE
Norovirus GI/GII: NEGATIVE
Rotavirus A by PCR: NEGATIVE
Salmonella by PCR: NEGATIVE
Shigella by PCR: NEGATIVE

## 2013-03-09 LAB — GLUCOSE, CAPILLARY
Glucose-Capillary: 167 mg/dL — ABNORMAL HIGH (ref 70–99)
Glucose-Capillary: 209 mg/dL — ABNORMAL HIGH (ref 70–99)
Glucose-Capillary: 235 mg/dL — ABNORMAL HIGH (ref 70–99)
Glucose-Capillary: 286 mg/dL — ABNORMAL HIGH (ref 70–99)

## 2013-03-09 LAB — MAGNESIUM: Magnesium: 1.6 mg/dL (ref 1.5–2.5)

## 2013-03-09 MED ORDER — SODIUM CHLORIDE 0.9 % IJ SOLN: 9.0000 mL | INTRAMUSCULAR | Status: DC | PRN

## 2013-03-09 MED ORDER — SACCHAROMYCES BOULARDII 250 MG PO CAPS
250.0000 mg | ORAL_CAPSULE | Freq: Two times a day (BID) | ORAL | Status: DC
  Administered 2013-03-10 – 2013-03-12 (×6): 250 mg via ORAL
  Filled 2013-03-09 (×7): qty 1

## 2013-03-09 MED ORDER — DIPHENHYDRAMINE HCL 12.5 MG/5ML PO ELIX: 12.5000 mg | ORAL_SOLUTION | Freq: Four times a day (QID) | ORAL | Status: DC | PRN

## 2013-03-09 MED ORDER — VANCOMYCIN 50 MG/ML ORAL SOLUTION
125.0000 mg | Freq: Four times a day (QID) | ORAL | Status: DC
  Administered 2013-03-10: 125 mg via ORAL
  Filled 2013-03-09 (×6): qty 2.5

## 2013-03-09 MED ORDER — INSULIN GLARGINE 100 UNIT/ML ~~LOC~~ SOLN
15.0000 [IU] | Freq: Every day | SUBCUTANEOUS | Status: DC
  Filled 2013-03-09: qty 0.15

## 2013-03-09 MED ORDER — OXYCODONE HCL 5 MG PO TABS
10.0000 mg | ORAL_TABLET | ORAL | Status: DC | PRN
  Administered 2013-03-11 – 2013-03-12 (×2): 10 mg via ORAL
  Filled 2013-03-09 (×3): qty 2

## 2013-03-09 MED ORDER — INSULIN ASPART 100 UNIT/ML ~~LOC~~ SOLN
0.0000 [IU] | SUBCUTANEOUS | Status: DC
  Administered 2013-03-09 (×2): 4 [IU] via SUBCUTANEOUS
  Administered 2013-03-10 (×3): 7 [IU] via SUBCUTANEOUS
  Administered 2013-03-10: 11 [IU] via SUBCUTANEOUS

## 2013-03-09 MED ORDER — NALOXONE HCL 0.4 MG/ML IJ SOLN: 0.4000 mg | INTRAMUSCULAR | Status: DC | PRN

## 2013-03-09 MED ORDER — INSULIN ASPART 100 UNIT/ML ~~LOC~~ SOLN: 0.0000 [IU] | Freq: Every day | SUBCUTANEOUS | Status: DC

## 2013-03-09 MED ORDER — ONDANSETRON HCL 4 MG/2ML IJ SOLN: 4.0000 mg | Freq: Four times a day (QID) | INTRAMUSCULAR | Status: DC | PRN

## 2013-03-09 MED ORDER — DIPHENHYDRAMINE HCL 50 MG/ML IJ SOLN: 12.5000 mg | Freq: Four times a day (QID) | INTRAMUSCULAR | Status: DC | PRN

## 2013-03-09 MED ORDER — HYDROMORPHONE 0.3 MG/ML IV SOLN
INTRAVENOUS | Status: DC
  Administered 2013-03-09: 22:00:00 via INTRAVENOUS
  Administered 2013-03-09: 2.24 mg via INTRAVENOUS
  Administered 2013-03-09: 11:00:00 via INTRAVENOUS
  Administered 2013-03-10: 1.49 mg via INTRAVENOUS
  Administered 2013-03-10: 1.25 mg via INTRAVENOUS
  Administered 2013-03-10: 0.75 mg via INTRAVENOUS
  Administered 2013-03-10: 18:00:00 via INTRAVENOUS
  Administered 2013-03-10: 1.74 mg via INTRAVENOUS
  Administered 2013-03-10: 1.24 mg via INTRAVENOUS
  Administered 2013-03-10: 1.49 mg via INTRAVENOUS
  Administered 2013-03-11: 1.41 mg via INTRAVENOUS
  Administered 2013-03-11: 1.49 mg via INTRAVENOUS
  Administered 2013-03-11: 2.99 mg via INTRAVENOUS
  Administered 2013-03-11: 0.499 mg via INTRAVENOUS
  Administered 2013-03-11: 1.99 mg via INTRAVENOUS
  Administered 2013-03-11: 0.5 mg via INTRAVENOUS
  Administered 2013-03-12: 2.24 mg via INTRAVENOUS
  Administered 2013-03-12: 0.25 mg via INTRAVENOUS
  Filled 2013-03-09 (×4): qty 25

## 2013-03-09 MED ORDER — INSULIN ASPART 100 UNIT/ML ~~LOC~~ SOLN
0.0000 [IU] | Freq: Three times a day (TID) | SUBCUTANEOUS | Status: DC
  Administered 2013-03-09: 8 [IU] via SUBCUTANEOUS

## 2013-03-09 MED ORDER — INSULIN GLARGINE 100 UNIT/ML ~~LOC~~ SOLN: 15.0000 [IU] | Freq: Every day | SUBCUTANEOUS | Status: DC

## 2013-03-09 NOTE — Progress Notes (Addendum)
[facility] PROGRESS NOTE  [name] ZOX:[id number] DOB: June 26, 1975 DOA: 03/06/2013 PCP: Jeri Cos, MD  Brief narrative: [name] is an 37 y.o. female with a PMH of diabetes, bipolar disorder, multiple hospitalizations for pain after telling her treating physicians and she had sickle cell anemia but ultimately a sickle cell screen was performed which was negative and gout who was admitted on 03/07/2013 with nausea and vomiting secondary to acute pancreatitis.  Assessment/Plan: Principal Problem:   Acute pancreatitis with abdominal pain and nausea/vomiting -Continue supportive care with IV fluids, antiemetics, and pain control. -Currently NPO, but nursing staff suspects she has been eating surreptitiously. We'll advance diet to bland and stop IV pain medications. -The patient is status post cholecystectomy. LFTs not elevated. Triglycerides 157.  -Diarrhea has slowed considerably. CT scan of the abdomen and pelvis consistent with mild pancreatitis.  Active Problems:   Bipolar -Continue Seroquel.   Hypokalemia -Continue to replete with potassium in IV fluids. Check magnesium.   DIABETES MELLITUS, TYPE II -Currently on Lantus 10 units daily along with moderate scale SSI.  Will switch to Lantus 15 units daily, resistant scale SSI Q 4 hours.   -CBGs  152-209.  -Seen by diabetes coordinator 03/09/13.   Chronic microcytic anemia -Ferritin was 4 on 07/04/2012. These findings are consistent with iron deficiency. -Likely from menstrual losses/fibroids (enlarged uterus consistent with fibroids noted on CT scan of abdomen/pelvis done 12/01/2012).   TOBACCO ABUSE -Tobacco cessation counseling per nursing staff.  Code Status: Full. Family Communication: No family at the bedside. Disposition Plan: Home when stable.   Medical Consultants:  None.  Other Consultants:  None.  Anti-infectives:  None.  HPI/Subjective: [name] tells me she feels a bit better but  had 2 loose stools yesterday and multiple episodes of vomiting. She denies eating food surreptitiously.  Objective: Filed Vitals:   03/08/13 1316 03/08/13 1821 03/08/13 2138 03/09/13 0627  BP: 142/81 111/57 122/67 111/65  Pulse: 72 79 70 73  Temp: 98.1 F (36.7 C) 98.2 F (36.8 C) 99.2 F (37.3 C) 97.9 F (36.6 C)  TempSrc: Oral Oral Oral Oral  Resp: 20 18 18 18   Height:      Weight:    91.21 kg (201 lb 1.3 oz)  SpO2: 100% 98% 100% 100%    Intake/Output Summary (Last 24 hours) at 03/09/13 0721 Last data filed at 03/09/13 0600  Gross per 24 hour  Intake   2500 ml  Output    400 ml  Net   2100 ml    Exam: Gen:  Lethargic Cardiovascular:  RRR, No M/R/G Respiratory:  Lungs CTAB Gastrointestinal:  Abdomen soft, diffusely tender, + BS Extremities:  No C/E/C  Data Reviewed: Basic Metabolic Panel:  Recent Labs Lab 03/05/13 2145 03/06/13 0708 03/07/13 0125 03/07/13 0556 03/08/13 0355  NA 141 137 136 139 135  K 3.7 3.5 3.3* 3.2* 3.4*  CL 103 100 99 103 102  CO2 23 21 27 27 24   GLUCOSE 264* 300* 230* 228* 181*  BUN 12 18 13 10 10   CREATININE 0.46* 0.50 0.45* 0.48* 0.52  CALCIUM 9.8 9.6 9.0 8.3* 8.8   GFR Estimated Creatinine Clearance: 108.5 ml/min (by C-G formula based on Cr of 0.52). Liver Function Tests:  Recent Labs Lab 03/05/13 2145 03/06/13 0708 03/07/13 0125  AST 14 16 47*  ALT 6 5 21   ALKPHOS 68 58 61  BILITOT 0.3 0.3 0.5  PROT 8.6* 8.6* 8.1  ALBUMIN 4.3 4.0 3.7  Recent Labs Lab 03/06/13 0708 03/07/13 0125 03/08/13 0355  LIPASE 47 122* 133*   CBC:  Recent Labs Lab 03/05/13 2145 03/06/13 0708 03/07/13 0125 03/07/13 0556  WBC 10.7* 9.9 9.7 8.7  NEUTROABS  --  8.6* 7.7  --   HGB 9.8* 9.2* 9.0* 9.2*  HCT 31.6* 29.3* 29.8* 30.3*  MCV 58.3* 58.4* 59.6* 60.2*  PLT 225 196 177 179   CBG:  Recent Labs Lab 03/08/13 1303 03/08/13 1610 03/08/13 2025 03/09/13 0031 03/09/13 0407  GLUCAP 229* 153* 152* 209* 167*   Lipid  Profile  Recent Labs  03/07/13 0600  CHOL 145  HDL 28*  LDLCALC 86  TRIG 161*  CHOLHDL 5.2    Procedures and Diagnostic Studies: No results found.  Scheduled Meds: . enoxaparin (LOVENOX) injection  40 mg Subcutaneous Q24H  . insulin aspart  0-15 Units Subcutaneous Q4H  . insulin glargine  10 Units Subcutaneous Daily  . pantoprazole (PROTONIX) IV  40 mg Intravenous Q12H  . QUEtiapine  300 mg Oral BID   Continuous Infusions: . 0.9 % NaCl with KCl 40 mEq / L 100 mL/hr at 03/09/13 0237    Time spent: 25 minutes.   LOS: 3 days   [name]  [facility] Pager [phone].   *Please note that the hospitalists switch teams on Wednesdays. Please call the flow manager at [phone] if you are having difficulty reaching the hospitalist taking care of this patient as she can update you and provide the most up-to-date pager number of provider caring for the patient. If 8PM-8AM, please contact night-coverage at [email or web address], password [facility]  03/09/2013, 7:21 AM

## 2013-03-09 NOTE — MAU Provider Note (Signed)
Attestation of Attending Supervision of Advanced Practitioner (CNM/NP): Evaluation and management procedures were performed by the Advanced Practitioner under my supervision and collaboration. I have reviewed the Advanced Practitioner's note and chart, and I agree with the management and plan.  Katoria Yetman H. 4:40 PM   

## 2013-03-09 NOTE — Progress Notes (Signed)
Inpatient Diabetes Program Recommendations  AACE/ADA: New Consensus Statement on Inpatient Glycemic Control (2013)  Target Ranges:  Prepandial:   less than 140 mg/dL      Peak postprandial:   less than 180 mg/dL (1-2 hours)      Critically ill patients:  140 - 180 mg/dL   Reason for Assessment:  Hyperglycemia  Results for [name], [name] (MRN [id number]) as of 03/09/2013 15:17  Ref. Range 03/09/2013 00:31 03/09/2013 04:07 03/09/2013 07:50 03/09/2013 12:11  Glucose-Capillary Latest Range: 70-99 mg/dL 562 (H) 130 (H) 865 (H) 286 (H)    Inpatient Diabetes Program Recommendations Insulin - Basal: Increase Lantus to 20 units QHS Correction (SSI): Increase to resistant Q4 if NPO or tidwc and hs if eating Insulin - Meal Coverage: When pt begins diet, add Novolog 6 units tidwc for meal coverage insulin HgbA1C: Check HgbA1C to assess glycemic control prior to hospitalization Diet: When advanced, CHO mod med  Note: Will follow while inpatient.  Thank you. Ailene Ards, RD, LDN, CDE Inpatient Diabetes Coordinator [phone]

## 2013-03-09 NOTE — Progress Notes (Signed)
When entering patients room earlier in the evening, I observed a large container of [employer] chicken, and a large drink by patients bedside. I asked patient who the food belonged to and she stated it was for her visitor, who was sleeping on the window seat at the time. I reiterated to patient that she was NPO and should not eat or drink because of her nausea and vomiting. Patient said she understood.  A little while later, I observed the food was half eaten, and the visitor was sleeping on the window seat still. Patient again denied eating the food. I again explained why she was not to eat, and patient voiced understanding.

## 2013-03-10 DIAGNOSIS — Z8614 Personal history of Methicillin resistant Staphylococcus aureus infection: Secondary | ICD-10-CM | POA: Diagnosis present

## 2013-03-10 DIAGNOSIS — B373 Candidiasis of vulva and vagina: Secondary | ICD-10-CM | POA: Diagnosis present

## 2013-03-10 LAB — BASIC METABOLIC PANEL
Chloride: 104 mEq/L (ref 96–112)
Creatinine, Ser: 0.62 mg/dL (ref 0.50–1.10)
GFR calc Af Amer: >90 mL/min (ref >90)
GFR calc non Af Amer: >90 mL/min (ref >90)
Potassium: 3.7 mEq/L (ref 3.5–5.1)

## 2013-03-10 LAB — GLUCOSE, CAPILLARY
Glucose-Capillary: 162 mg/dL — ABNORMAL HIGH (ref 70–99)
Glucose-Capillary: 208 mg/dL — ABNORMAL HIGH (ref 70–99)
Glucose-Capillary: 275 mg/dL — ABNORMAL HIGH (ref 70–99)

## 2013-03-10 LAB — LIPASE, BLOOD: Lipase: 119 U/L — ABNORMAL HIGH (ref 11–59)

## 2013-03-10 MED ORDER — FAMOTIDINE 20 MG PO TABS
20.0000 mg | ORAL_TABLET | Freq: Every day | ORAL | Status: DC
  Administered 2013-03-11 – 2013-03-12 (×2): 20 mg via ORAL
  Filled 2013-03-10 (×2): qty 1

## 2013-03-10 MED ORDER — INSULIN GLARGINE 100 UNIT/ML ~~LOC~~ SOLN
20.0000 [IU] | Freq: Every day | SUBCUTANEOUS | Status: DC
  Administered 2013-03-10 – 2013-03-11 (×2): 20 [IU] via SUBCUTANEOUS
  Filled 2013-03-10 (×2): qty 0.2

## 2013-03-10 MED ORDER — INSULIN ASPART 100 UNIT/ML ~~LOC~~ SOLN
0.0000 [IU] | Freq: Every day | SUBCUTANEOUS | Status: DC
  Administered 2013-03-10 – 2013-03-11 (×2): 3 [IU] via SUBCUTANEOUS

## 2013-03-10 MED ORDER — MUPIROCIN 2 % EX OINT
TOPICAL_OINTMENT | Freq: Two times a day (BID) | CUTANEOUS | Status: DC
  Administered 2013-03-10 – 2013-03-11 (×3): via NASAL
  Administered 2013-03-11: 1 via NASAL
  Administered 2013-03-12: 11:00:00 via NASAL
  Filled 2013-03-10: qty 22

## 2013-03-10 MED ORDER — FLUCONAZOLE 150 MG PO TABS
150.0000 mg | ORAL_TABLET | Freq: Every day | ORAL | Status: AC
  Administered 2013-03-10 – 2013-03-12 (×3): 150 mg via ORAL
  Filled 2013-03-10 (×4): qty 1

## 2013-03-10 MED ORDER — METRONIDAZOLE 500 MG PO TABS
500.0000 mg | ORAL_TABLET | Freq: Three times a day (TID) | ORAL | Status: DC
  Administered 2013-03-10 – 2013-03-12 (×8): 500 mg via ORAL
  Filled 2013-03-10 (×11): qty 1

## 2013-03-10 MED ORDER — PROMETHAZINE HCL 25 MG/ML IJ SOLN
25.0000 mg | Freq: Once | INTRAMUSCULAR | Status: AC
  Filled 2013-03-10: qty 1

## 2013-03-10 MED ORDER — CHLORHEXIDINE GLUCONATE CLOTH 2 % EX PADS
6.0000 | MEDICATED_PAD | Freq: Every day | CUTANEOUS | Status: DC
  Administered 2013-03-10 – 2013-03-12 (×3): 6 via TOPICAL

## 2013-03-10 MED ORDER — INSULIN ASPART 100 UNIT/ML ~~LOC~~ SOLN
0.0000 [IU] | Freq: Three times a day (TID) | SUBCUTANEOUS | Status: DC
  Administered 2013-03-10: 11 [IU] via SUBCUTANEOUS
  Administered 2013-03-11: 20 [IU] via SUBCUTANEOUS
  Administered 2013-03-11 (×2): 11 [IU] via SUBCUTANEOUS
  Administered 2013-03-12: 20 [IU] via SUBCUTANEOUS

## 2013-03-10 NOTE — Care Management Note (Signed)
Cm spoke with patient at bedside to assess for dc needs. Pt states plans to change PCP providers to Dr. Della Goo. Pt offered information concerning [facility], pt declined. Pt states having no HH or DME needs. Pt states no difficulty obtaining rx medications. Pt request information concerning food assistance. Cm provided pt with list of community resources.    Roxy Manns Danner,RN,MSN [phone]

## 2013-03-10 NOTE — Progress Notes (Signed)
[facility] PROGRESS NOTE  [name] ZOX:[id number] DOB: [birth year] DOA: 03/06/2013 PCP: Jeri Cos, MD  Brief narrative: [name] is an 37 y.o. female with a PMH of diabetes, bipolar disorder, multiple hospitalizations for pain after telling her treating physicians and she had sickle cell anemia but ultimately a sickle cell screen was performed which was negative and gout who was admitted on 03/07/2013 with nausea and vomiting secondary to acute pancreatitis.  Stools were + for C. Diff.  Assessment/Plan: Principal Problem:   Acute pancreatitis with abdominal pain and nausea/vomiting -Continue supportive care with IV fluids, antiemetics, and pain control. -Unable to tolerate diet advancement yesterday, ultimately placed on PCA Dilaudid-HP and made n.p.o. -The patient is status post cholecystectomy. LFTs not elevated. Triglycerides 157.  -Diarrhea has slowed considerably. CT scan of the abdomen and pelvis consistent with mild pancreatitis. No mention of colitis. -Advance diet and monitor. Active Problems:   Vaginal candidiasis -Diflucan ordered.   H/O MRSA / vulvar abscesses -Chlorhexidine washes daily/intranasal bactroban.  Would send home with Hibiclens soap prescription to shower weekly.   Clostridium difficile diarrhea -Started on oral vancomycin and Florastar.  We'll switch vancomycin to by mouth Flagyl. Patient has normal kidney function, no leukocytosis, and has no clinical instability.   Bipolar -Continue Seroquel.   Hypokalemia -Repleted. Magnesium okay.   DIABETES MELLITUS, TYPE II -Currently on Lantus 15 units daily along with moderate scale SSI.  Will switch to Lantus 20 units daily, resistant scale SSI Q 4 hours.   -CBGs  [id number].  -Seen by diabetes coordinator 03/09/13.   Chronic microcytic anemia -Ferritin was 4 on 07/04/2012. These findings are consistent with iron deficiency. -Likely from menstrual losses/fibroids (enlarged uterus consistent with  fibroids noted on CT scan of abdomen/pelvis done 12/01/2012).   TOBACCO ABUSE -Tobacco cessation counseling per nursing staff.  Code Status: Full. Family Communication: No family at the bedside. Disposition Plan: Home when stable.   Medical Consultants:  None.  Other Consultants:  None.  Anti-infectives:  None.  HPI/Subjective: [name] tells me she feels a bit better but had 2 loose stools yesterday and multiple episodes of vomiting. She denies eating food surreptitiously.  Tells me she has vaginal itching, and a history of boils for which she has been on multiple rounds of antibiotics including clindamycin.    Objective: Filed Vitals:   03/09/13 2215 03/10/13 0400 03/10/13 0645 03/10/13 0745  BP: 125/52  120/55   Pulse: 78  79   Temp: 98.3 F (36.8 C)  98.1 F (36.7 C)   TempSrc: Oral  Oral   Resp: 16 17 18 17   Height:      Weight:      SpO2: 98% 98% 100% 99%    Intake/Output Summary (Last 24 hours) at 03/10/13 0808 Last data filed at 03/10/13 0600  Gross per 24 hour  Intake   2608 ml  Output      0 ml  Net   2608 ml    Exam: Gen:  Lethargic Cardiovascular:  RRR, No M/R/G Respiratory:  Lungs CTAB Gastrointestinal:  Abdomen soft, diffusely tender, + BS Extremities:  No C/E/C  Data Reviewed: Basic Metabolic Panel:  Recent Labs Lab 03/06/13 0708 03/07/13 0125 03/07/13 0556 03/08/13 0355 03/09/13 1010 03/10/13 0348  NA 137 136 139 135  --  138  K 3.5 3.3* 3.2* 3.4*  --  3.7  CL 100 99 103 102  --  104  CO2 21 27 27 24   --  24  GLUCOSE 300* 230* 228* 181*  --  211*  BUN 18 13 10 10   --  8  CREATININE 0.50 0.45* 0.48* 0.52  --  0.62  CALCIUM 9.6 9.0 8.3* 8.8  --  8.8  MG  --   --   --   --  1.6  --    GFR Estimated Creatinine Clearance: 108.5 ml/min (by C-G formula based on Cr of 0.62). Liver Function Tests:  Recent Labs Lab 03/05/13 2145 03/06/13 0708 03/07/13 0125  AST 14 16 47*  ALT 6 5 21   ALKPHOS 68 58 61  BILITOT 0.3 0.3  0.5  PROT 8.6* 8.6* 8.1  ALBUMIN 4.3 4.0 3.7    Recent Labs Lab 03/06/13 0708 03/07/13 0125 03/08/13 0355 03/10/13 0348  LIPASE 47 122* 133* 119*   CBC:  Recent Labs Lab 03/05/13 2145 03/06/13 0708 03/07/13 0125 03/07/13 0556  WBC 10.7* 9.9 9.7 8.7  NEUTROABS  --  8.6* 7.7  --   HGB 9.8* 9.2* 9.0* 9.2*  HCT 31.6* 29.3* 29.8* 30.3*  MCV 58.3* 58.4* 59.6* 60.2*  PLT 225 196 177 179   CBG:  Recent Labs Lab 03/09/13 0031 03/09/13 0407 03/09/13 0750 03/09/13 1211 03/09/13 1727  GLUCAP 209* 167* 235* 286* 153*    Procedures and Diagnostic Studies: No results found.  Scheduled Meds: . enoxaparin (LOVENOX) injection  40 mg Subcutaneous Q24H  . HYDROmorphone PCA 0.3 mg/mL   Intravenous Q4H  . insulin aspart  0-20 Units Subcutaneous Q4H  . insulin glargine  15 Units Subcutaneous Daily  . pantoprazole (PROTONIX) IV  40 mg Intravenous Q12H  . QUEtiapine  300 mg Oral BID  . saccharomyces boulardii  250 mg Oral BID  . vancomycin  125 mg Oral QID   Continuous Infusions: . 0.9 % NaCl with KCl 40 mEq / L 100 mL/hr at 03/10/13 0000    Time spent: 35 minutes with > 50% of time discussing current diagnostic test results, clinical impression and plan of care.    LOS: 4 days   Shalina Norfolk  [facility] Pager [phone].   *Please note that the hospitalists switch teams on Wednesdays. Please call the flow manager at [phone] if you are having difficulty reaching the hospitalist taking care of this patient as she can update you and provide the most up-to-date pager number of provider caring for the patient. If 8PM-8AM, please contact night-coverage at [email or web address], password [facility]  03/10/2013, 8:08 AM

## 2013-03-10 NOTE — Progress Notes (Signed)
Patient reports vomiting twice throughout the night.  She also reports still having a significant amount of pain.  Continues to ask many questions and is notably concerned over the thought of being discharged today.  Will continue to monitor patient.Kenton Kingfisher [place]

## 2013-03-10 NOTE — Progress Notes (Signed)
ANTIBIOTIC CONSULT NOTE - INITIAL  Pharmacy Consult for Po Vancomycin Indication: C-diff  Allergies  Allergen Reactions  . Reglan [Metoclopramide] Shortness Of Breath  . Zofran [Ondansetron Hcl] Hives and Nausea And Vomiting    Patient Measurements: Height: 5\' 6"  (167.6 cm) Weight: 201 lb 1.3 oz (91.21 kg) IBW/kg (Calculated) : 59.3   Vital Signs: Temp: 98.3 F (36.8 C) (09/15 2215) Temp src: Oral (09/15 2215) BP: 125/52 mmHg (09/15 2215) Pulse Rate: 78 (09/15 2215) Intake/Output from previous day: 09/15 0701 - 09/16 0700 In: 180 [P.O.:180] Out: -  Intake/Output from this shift:    Labs:  Recent Labs  03/07/13 0125 03/07/13 0556 03/08/13 0355  WBC 9.7 8.7  --   HGB 9.0* 9.2*  --   PLT 177 179  --   CREATININE 0.45* 0.48* 0.52   Estimated Creatinine Clearance: 108.5 ml/min (by C-G formula based on Cr of 0.52). No results found for this basename: VANCOTROUGH, Leodis Binet, VANCORANDOM, GENTTROUGH, GENTPEAK, GENTRANDOM, TOBRATROUGH, TOBRAPEAK, TOBRARND, AMIKACINPEAK, AMIKACINTROU, AMIKACIN,  in the last 72 hours   Microbiology: Recent Results (from the past 720 hour(s))  URINE CULTURE     Status: None   Collection Time    03/07/13  1:18 AM      Result Value Range Status   Specimen Description URINE, RANDOM   Final   Special Requests NONE   Final   Culture  Setup Time     Final   Value: 03/07/2013 18:07     Performed at [facility] Count     Final   Value: 85,000 COLONIES/ML     Performed at [facility]   Culture     Final   Value: Multiple bacterial morphotypes present, none predominant. Suggest appropriate recollection if clinically indicated.     Performed at [facility]   Report Status 03/08/2013 FINAL   Final  MRSA PCR SCREENING     Status: None   Collection Time    03/08/13  8:36 AM      Result Value Range Status   MRSA by PCR NEGATIVE  NEGATIVE Final   Comment:            The GeneXpert MRSA Assay (FDA      approved for NASAL specimens     only), is one component of a     comprehensive MRSA colonization     surveillance program. It is not     intended to diagnose MRSA     infection nor to guide or     monitor treatment for     MRSA infections.    Medical History: Past Medical History  Diagnosis Date  . Diabetes mellitus   . Anemia   . Headache(784.0)   . Seasonal allergies   . Abnormal Pap smear   . Gout   . Depression   . Anxiety   . UTI (urinary tract infection)   . Bipolar 1 disorder     Medications:  Scheduled:  . enoxaparin (LOVENOX) injection  40 mg Subcutaneous Q24H  . HYDROmorphone PCA 0.3 mg/mL   Intravenous Q4H  . insulin aspart  0-20 Units Subcutaneous Q4H  . insulin glargine  15 Units Subcutaneous Daily  . pantoprazole (PROTONIX) IV  40 mg Intravenous Q12H  . QUEtiapine  300 mg Oral BID  . saccharomyces boulardii  250 mg Oral BID  . vancomycin  125 mg Oral QID   Infusions:  . 0.9 % NaCl with KCl 40 mEq /  L 100 mL/hr at 03/09/13 5409   Assessment: 37 yo with hx of DM, bipolar disorder admitted with n/v secondary to acute pancreatitis. C difficile toxin A/B +  Vancomycin po started.  Goal of Therapy:  Treat C-diff  Plan:   Vancomycin 125mg  po qid x 14 days  Lorenza Evangelist 03/10/2013,12:08 AM

## 2013-03-11 DIAGNOSIS — R1115 Cyclical vomiting syndrome unrelated to migraine: Secondary | ICD-10-CM

## 2013-03-11 DIAGNOSIS — R109 Unspecified abdominal pain: Secondary | ICD-10-CM

## 2013-03-11 DIAGNOSIS — D649 Anemia, unspecified: Secondary | ICD-10-CM

## 2013-03-11 DIAGNOSIS — K859 Acute pancreatitis without necrosis or infection, unspecified: Secondary | ICD-10-CM

## 2013-03-11 LAB — CBC
Hemoglobin: 8.5 g/dL — ABNORMAL LOW (ref 12.0–15.0)
MCH: 17.8 pg — ABNORMAL LOW (ref 26.0–34.0)
MCHC: 29.1 g/dL — ABNORMAL LOW (ref 30.0–36.0)
MCV: 61.2 fL — ABNORMAL LOW (ref 78.0–100.0)
Platelets: 143 10*3/uL — ABNORMAL LOW (ref 150–400)
RDW: 21.4 % — ABNORMAL HIGH (ref 11.5–15.5)

## 2013-03-11 LAB — BASIC METABOLIC PANEL
BUN: 11 mg/dL (ref 6–23)
Calcium: 9.4 mg/dL (ref 8.4–10.5)
GFR calc non Af Amer: >90 mL/min (ref >90)
Glucose, Bld: 346 mg/dL — ABNORMAL HIGH (ref 70–99)
Potassium: 4.2 mEq/L (ref 3.5–5.1)

## 2013-03-11 LAB — GLUCOSE, CAPILLARY
Glucose-Capillary: 387 mg/dL — ABNORMAL HIGH (ref 70–99)
Glucose-Capillary: 292 mg/dL — ABNORMAL HIGH (ref 70–99)

## 2013-03-11 MED ORDER — INSULIN GLARGINE 100 UNIT/ML ~~LOC~~ SOLN
35.0000 [IU] | Freq: Every day | SUBCUTANEOUS | Status: DC
  Administered 2013-03-12: 35 [IU] via SUBCUTANEOUS
  Filled 2013-03-11: qty 0.35

## 2013-03-11 MED ORDER — LORAZEPAM 2 MG/ML IJ SOLN: 0.5000 mg | Freq: Two times a day (BID) | INTRAMUSCULAR | Status: DC | PRN

## 2013-03-11 MED ORDER — AMLODIPINE BESYLATE 10 MG PO TABS
10.0000 mg | ORAL_TABLET | Freq: Every day | ORAL | Status: DC
  Administered 2013-03-11: 10 mg via ORAL
  Filled 2013-03-11 (×2): qty 1

## 2013-03-11 NOTE — Progress Notes (Signed)
[facility] PROGRESS NOTE  [name] JYN:[id number] DOB: [birth year] DOA: 03/06/2013 PCP: Jeri Cos, MD  Brief narrative: 37 year old  Female with a past medical history of diabetes, bipolar disorder, multiple hospitalizations for pain who was admitted on 03/07/2013 with nausea and vomiting secondary to acute pancreatitis. Stools were + for C. Diff.   Assessment/Plan:   Principal Problem:  Acute pancreatitis with abdominal pain and nausea/vomiting  - still in significant amount of pain, 8/10 in intensity - continue PCA dilaudid - D/C diet order and keep NPO for now - continue IV fluids and antiemetics PRN - diarrhea stopped  - continue Pepcid 20 mg daily  Active Problems:  Hypertension - Blood pressure 171/86 - Start Norvasc 10 mg daily Vaginal candidiasis  - continue Fluconazole H/O MRSA / vulvar abscesses  - Chlorhexidine washes daily/intranasal bactroban. Would send home with Hibiclens soap prescription to shower weekly.  Clostridium difficile diarrhea  - Started on oral vancomycin and Florastar. Now on PO flagyl Bipolar  - Continue Seroquel.  Hypokalemia  - secondary to GI losses - Repleted.   DIABETES MELLITUS, TYPE II  - now NPO so we will use Lantus 35 units at bedtime which is half of the dose she takes at home - Continue sliding scale insulin Chronic microcytic anemia  - Ferritin was 4 on 07/04/2012. These findings are consistent with iron deficiency.  - Likely from menstrual losses/fibroids (enlarged uterus consistent with fibroids noted on CT scan of abdomen/pelvis done 12/01/2012).  TOBACCO ABUSE  - Tobacco cessation counseling per nursing staff.   Code Status: Full.  Family Communication: No family at the bedside.  Disposition Plan: Home when stable.   Medical Consultants:  None. Other Consultants:  None. Anti-infectives:  None.   Manson Passey, MD  [facility] Pager [phone]  If 7PM-7AM, please contact  night-coverage [email or web address] Password [facility] 03/11/2013, 11:33 AM   LOS: 5 days   HPI/Subjective: Still with abdominal pain, 8/10 in intensity.  Objective: Filed Vitals:   03/11/13 0400 03/11/13 0600 03/11/13 0759 03/11/13 0959  BP:  143/68  171/86  Pulse:  72  76  Temp:  98 F (36.7 C)    TempSrc:  Oral    Resp: 16 18 18 18   Height:      Weight:      SpO2: 100% 100% 100% 100%    Intake/Output Summary (Last 24 hours) at 03/11/13 1133 Last data filed at 03/11/13 0608  Gross per 24 hour  Intake 3287.99 ml  Output      0 ml  Net 3287.99 ml    Exam:   General:  Pt is alert, follows commands appropriately, not in acute distress  Cardiovascular: Regular rate and rhythm, S1/S2, no murmurs, no rubs, no gallops  Respiratory: Clear to auscultation bilaterally, no wheezing, no crackles, no rhonchi  Abdomen: Soft, non tender, non distended, bowel sounds present, no guarding  Extremities: No edema, pulses DP and PT palpable bilaterally  Neuro: Grossly nonfocal  Data Reviewed: Basic Metabolic Panel:  Recent Labs Lab 03/07/13 0125 03/07/13 0556 03/08/13 0355 03/09/13 1010 03/10/13 0348 03/11/13 0340  NA 136 139 135  --  138 135  K 3.3* 3.2* 3.4*  --  3.7 4.2  CL 99 103 102  --  104 99  CO2 27 27 24   --  24 25  GLUCOSE 230* 228* 181*  --  211* 346*  BUN 13 10 10   --  8 11  CREATININE 0.45* 0.48* 0.52  --  0.62 0.53  CALCIUM 9.0 8.3* 8.8  --  8.8 9.4  MG  --   --   --  1.6  --   --    Liver Function Tests:  Recent Labs Lab 03/05/13 2145 03/06/13 0708 03/07/13 0125  AST 14 16 47*  ALT 6 5 21   ALKPHOS 68 58 61  BILITOT 0.3 0.3 0.5  PROT 8.6* 8.6* 8.1  ALBUMIN 4.3 4.0 3.7    Recent Labs Lab 03/06/13 0708 03/07/13 0125 03/08/13 0355 03/10/13 0348  LIPASE 47 122* 133* 119*   No results found for this basename: AMMONIA,  in the last 168 hours CBC:  Recent Labs Lab 03/05/13 2145 03/06/13 0708 03/07/13 0125 03/07/13 0556 03/11/13 0340  WBC  10.7* 9.9 9.7 8.7 7.8  NEUTROABS  --  8.6* 7.7  --   --   HGB 9.8* 9.2* 9.0* 9.2* 8.5*  HCT 31.6* 29.3* 29.8* 30.3* 29.2*  MCV 58.3* 58.4* 59.6* 60.2* 61.2*  PLT 225 196 177 179 143*   Cardiac Enzymes: No results found for this basename: CKTOTAL, CKMB, CKMBINDEX, TROPONINI,  in the last 168 hours BNP: No components found with this basename: POCBNP,  CBG:  Recent Labs Lab 03/10/13 0829 03/10/13 1123 03/10/13 1822 03/10/13 2202 03/11/13 0716  GLUCAP 254* 208* 270* 275* 387*    URINE CULTURE     Status: None   Collection Time    03/07/13  1:18 AM      Result Value Range Status   Specimen Description URINE, RANDOM   Final   Special Requests NONE   Final   Culture  Setup Time     Final   Value: 03/07/2013 18:07     Performed at [facility] Count     Final   Value: 85,000 COLONIES/ML     Performed at [facility]   Culture     Final   Value: Multiple bacterial morphotypes present, none predominant. Suggest appropriate recollection if clinically indicated.     Performed at [facility]   Report Status 03/08/2013 FINAL   Final  MRSA PCR SCREENING     Status: None   Collection Time    03/08/13  8:36 AM      Result Value Range Status   MRSA by PCR NEGATIVE  NEGATIVE Final     Studies: No results found.  Scheduled Meds: . amLODipine  10 mg Oral Daily  . enoxaparin (LOVENOX)  40 mg Subcutaneous Q24H  . famotidine  20 mg Oral Daily  . fluconazole  150 mg Oral Daily  . HYDROmorphone PCA   Intravenous Q4H  . insulin aspart  0-20 Units Subcutaneous TID WC  . insulin aspart  0-5 Units Subcutaneous QHS  .  insulin glargine  35 Units Subcutaneous Daily  . metroNIDAZOLE  500 mg Oral Q8H  . QUEtiapine  300 mg Oral BID  . saccharomyces boulardii  250 mg Oral BID   Continuous Infusions: . 0.9 % NaCl with KCl 40 mEq / L 100 mL/hr at 03/11/13 0604

## 2013-03-12 DIAGNOSIS — A0472 Enterocolitis due to Clostridium difficile, not specified as recurrent: Principal | ICD-10-CM

## 2013-03-12 MED ORDER — MUPIROCIN 2 % EX OINT: TOPICAL_OINTMENT | Freq: Two times a day (BID) | CUTANEOUS | Status: DC

## 2013-03-12 MED ORDER — INSULIN GLARGINE 100 UNIT/ML ~~LOC~~ SOLN: 70.0000 [IU] | Freq: Every day | SUBCUTANEOUS | Status: DC

## 2013-03-12 MED ORDER — SACCHAROMYCES BOULARDII 250 MG PO CAPS: 250.0000 mg | ORAL_CAPSULE | Freq: Two times a day (BID) | ORAL | Status: DC

## 2013-03-12 MED ORDER — AMLODIPINE BESYLATE 5 MG PO TABS: 5.0000 mg | ORAL_TABLET | Freq: Every day | ORAL | Status: DC

## 2013-03-12 MED ORDER — SULFAMETHOXAZOLE-TRIMETHOPRIM 800-160 MG PO TABS: 2.0000 | ORAL_TABLET | Freq: Two times a day (BID) | ORAL | Status: AC

## 2013-03-12 MED ORDER — HYDROMORPHONE HCL PF 2 MG/ML IJ SOLN
2.0000 mg | Freq: Once | INTRAMUSCULAR | Status: AC
  Administered 2013-03-12: 2 mg via INTRAVENOUS
  Filled 2013-03-12: qty 1

## 2013-03-12 MED ORDER — FLUCONAZOLE 100 MG PO TABS: 100.0000 mg | ORAL_TABLET | Freq: Every day | ORAL | Status: DC

## 2013-03-12 MED ORDER — ALPRAZOLAM 2 MG PO TABS: 2.0000 mg | ORAL_TABLET | Freq: Two times a day (BID) | ORAL | Status: DC | PRN

## 2013-03-12 MED ORDER — HYDROMORPHONE HCL PF 2 MG/ML IJ SOLN: 2.0000 mg | INTRAMUSCULAR | Status: DC | PRN

## 2013-03-12 MED ORDER — OXYCODONE-ACETAMINOPHEN 5-325 MG PO TABS
2.0000 | ORAL_TABLET | ORAL | Status: DC | PRN
  Administered 2013-03-12: 2 via ORAL
  Filled 2013-03-12: qty 2

## 2013-03-12 MED ORDER — FAMOTIDINE 20 MG PO TABS: 20.0000 mg | ORAL_TABLET | Freq: Every day | ORAL | Status: DC

## 2013-03-12 MED ORDER — OXYCODONE-ACETAMINOPHEN 10-325 MG PO TABS: 1.0000 | ORAL_TABLET | Freq: Four times a day (QID) | ORAL | Status: DC | PRN

## 2013-03-12 MED ORDER — SULFAMETHOXAZOLE-TMP DS 800-160 MG PO TABS
1.0000 | ORAL_TABLET | Freq: Two times a day (BID) | ORAL | Status: DC
  Administered 2013-03-12: 1 via ORAL
  Filled 2013-03-12 (×2): qty 1

## 2013-03-12 MED ORDER — ZOLPIDEM TARTRATE 5 MG PO TABS: 5.0000 mg | ORAL_TABLET | Freq: Every evening | ORAL | Status: DC | PRN

## 2013-03-12 MED ORDER — METRONIDAZOLE 500 MG PO TABS: 500.0000 mg | ORAL_TABLET | Freq: Three times a day (TID) | ORAL | Status: DC

## 2013-03-12 MED ORDER — HYDROMORPHONE HCL 2 MG PO TABS: 2.0000 mg | ORAL_TABLET | ORAL | Status: DC | PRN

## 2013-03-12 MED ORDER — QUETIAPINE FUMARATE 300 MG PO TABS: 300.0000 mg | ORAL_TABLET | Freq: Two times a day (BID) | ORAL | Status: DC

## 2013-03-12 MED ORDER — SITAGLIPTIN PHOSPHATE 50 MG PO TABS: 50.0000 mg | ORAL_TABLET | Freq: Two times a day (BID) | ORAL | Status: DC

## 2013-03-12 NOTE — Discharge Summary (Signed)
Physician Discharge Summary  [name] ZOX:[id number] DOB: 06-23-1976 DOA: 03/06/2013  PCP: Jeri Cos, MD  Admit date: 03/06/2013 Discharge date: 03/12/2013  Recommendations for Outpatient Follow-up:  1. Please note that you have been hospitalized for C. difficile colitis and pancreatitis. 2. For C. difficile colitis you will be taking Flagyl for a total of 12 more days on discharge. Lateral 500 mg 3 times a days. Also use Florastor 250 mg twice a day for protection of colon flora 3. Please eat bland diet for next 24-48 hours if pain is still not adequately controlled. Prescription provided for percocet PRN and dilaudid only for breakthrough pain. 4. Continue Bactrim for 7 days twice daily for furunculi on left posterior thigh. Use mupirocin ointment ointment twice daily for next 2 weeks on that area MRSA prevention. 5. Continue fluconazole 100 mg daily for 5 days for vaginal candidiasis 6. Continue insulin current home regimen, 70 units daily along with sliding scale insulin. 7. Please followup with your primary care provider as you have informed us Dr. Della Goo 8. Recheck CBC on your next appointment with PCP; last hemoglobin 8.5. 9. Please note that we started Norvasc 5 mg daily for blood pressure control. Your blood pressure was high during this hospital stay until we started Norvasc. Continue Norvasc 5 mg daily.  Discharge Diagnoses:  Principal Problem:   Acute pancreatitis Active Problems:   Abdominal pain   DIABETES MELLITUS, TYPE II   Microcytic anemia   TOBACCO ABUSE   Nausea and vomiting   Hypokalemia   History of MRSA infection with recurrent vulvar abscesses   Vaginal candidiasis  Brief narrative:  37 year old Female with a past medical history of diabetes, bipolar disorder, multiple hospitalizations for pain who was admitted on 03/07/2013 with nausea and vomiting secondary to acute pancreatitis. Stools were + for C. Diff.   Assessment/Plan:    Principal Problem:  Acute pancreatitis with abdominal pain and nausea/vomiting  - Patient reports being controlled today. We will discontinue Dilaudid PCA and patient prefers to switch medications to by mouth. - Patient will be discharged today as she were the advance the diet herself and ate pizza yesterday even though we have her on n.p.o. Since and no vomiting we will continue by mouth medications and plan for discharge today - continue Pepcid 20 mg daily  Active Problems:  Hypertension  - Blood pressure now controlled, 100/50. We will discharge patient with Norvasc 5 mg daily instead of 10 mg daily. - Start Norvasc 10 mg daily  Vaginal candidiasis  - continue Fluconazole for 5 days on discharge H/O MRSA / vulvar abscesses  - Prescription provided for Bactrim twice daily for 7 more days on discharge. Will use mupirocin twice daily for 14 days Clostridium difficile diarrhea  - Started on oral vancomycin and Florastar. Now on PO flagyl which she will take for 12 more days on discharge Bipolar  - Continue Seroquel.  Hypokalemia  - secondary to GI losses  - Repleted.  DIABETES MELLITUS, TYPE II  - Patient can continue taking her regular insulin home regimen, Lantus 35 units daily as well as sliding scale insulin as needed Chronic microcytic anemia  - Ferritin was 4 on 07/04/2012. These findings are consistent with iron deficiency.  - Likely from menstrual losses/fibroids (enlarged uterus consistent with fibroids noted on CT scan of abdomen/pelvis done 12/01/2012).  TOBACCO ABUSE  - Tobacco cessation counseling per nursing staff.    Code Status: Full.  Family Communication: No family at the  bedside.   Medical Consultants:  None. Other Consultants:  None. Anti-infectives:  Bactrim for 7 days on discharge for thigh boil , So for 5 days on discharge for vaginal candidiasis   Manson Passey, MD  [facility]  Pager [phone]  Discharge Condition: Able for discharge home  today  Diet recommendation: Diet as tolerated, recommended bland diet for next 2 days  History of present illness:    Signed:  Manson Passey, MD  [facility] 03/12/2013, 11:21 AM  Pager #: [phone]   Discharge Exam: Filed Vitals:   03/12/13 1101  BP: 100/50  Pulse: 80  Temp: 97.9 F (36.6 C)  Resp: 16   Filed Vitals:   03/12/13 0400 03/12/13 0634 03/12/13 0848 03/12/13 1101  BP:  100/64  100/50  Pulse:  83  80  Temp:  97.9 F (36.6 C)  97.9 F (36.6 C)  TempSrc:  Oral  Oral  Resp: 18 16 16 16   Height:      Weight:      SpO2: 100% 100% 100% 100%    General: Pt is alert, follows commands appropriately, not in acute distress Cardiovascular: Regular rate and rhythm, S1/S2 +, no murmurs, no rubs, no gallops Respiratory: Clear to auscultation bilaterally, no wheezing, no crackles, no rhonchi Abdominal: Soft, non tender, non distended, bowel sounds +, no guarding Extremities: no edema, no cyanosis, pulses palpable bilaterally DP and PT; left posterior thigh small boil with slight surrounding erythema but no drainage  Neuro: Grossly nonfocal  Discharge Instructions  Discharge Orders   Future Orders Complete By Expires   Call MD for:  difficulty breathing, headache or visual disturbances  As directed    Call MD for:  persistant dizziness or light-headedness  As directed    Call MD for:  persistant nausea and vomiting  As directed    Call MD for:  severe uncontrolled pain  As directed    Diet - low sodium heart healthy  As directed    Discharge instructions  As directed    Comments:     1. Please note that you have been hospitalized for C. difficile colitis and pancreatitis. 2. For C. difficile colitis you will be taking Flagyl for a total of 12 more days on discharge. Lateral 500 mg 3 times a days. Also use Florastor 250 mg twice a day for protection of colon flora 3. Please eat bland diet for next 24-48 hours if pain is still not adequately controlled.  Prescription provided for percocet PRN and dilaudid only for breakthrough pain. 4. Continue Bactrim for 7 days twice daily for furunculi on left posterior thigh. Use mupirocin ointment ointment twice daily for next 2 weeks on that area MRSA prevention. 5. Continue fluconazole 100 mg daily for 5 days for vaginal candidiasis 6. Continue insulin current home regimen, 70 units daily along with sliding scale insulin. 7. Please followup with your primary care provider as you have informed us Dr. Della Goo 8. Recheck CBC on your next appointment with PCP; last hemoglobin 8.5.   Increase activity slowly  As directed        Medication List    STOP taking these medications       hydroxyurea 500 MG capsule  Commonly known as:  HYDREA      TAKE these medications       alprazolam 2 MG tablet  Commonly known as:  XANAX  Take 1 tablet (2 mg total) by mouth 2 (two) times daily as needed for sleep. Take  2 mg by mouth 3 times daily.     amLODipine 5 MG tablet  Commonly known as:  NORVASC  Take 1 tablet (5 mg total) by mouth daily.     amphetamine-dextroamphetamine 30 MG tablet  Commonly known as:  ADDERALL  Take 30 mg by mouth 2 (two) times daily. Take 30 mg by mouth 2 times daily.     famotidine 20 MG tablet  Commonly known as:  PEPCID  Take 1 tablet (20 mg total) by mouth daily.     fluconazole 100 MG tablet  Commonly known as:  DIFLUCAN  Take 1 tablet (100 mg total) by mouth daily.     HYDROmorphone 2 MG tablet  Commonly known as:  DILAUDID  Take 1 tablet (2 mg total) by mouth every 4 (four) hours as needed for pain.     insulin aspart 100 UNIT/ML injection  Commonly known as:  novoLOG  Inject 10-20 Units into the skin 3 (three) times daily before meals. Sliding scale     insulin glargine 100 UNIT/ML injection  Commonly known as:  LANTUS  Inject 0.7 mLs (70 Units total) into the skin at bedtime.     metroNIDAZOLE 500 MG tablet  Commonly known as:  FLAGYL  Take 1 tablet  (500 mg total) by mouth every 8 (eight) hours.     mupirocin ointment 2 %  Commonly known as:  BACTROBAN  Apply topically 2 (two) times daily. On thigh boil; use for 2 weeks     oxyCODONE-acetaminophen 10-325 MG per tablet  Commonly known as:  PERCOCET  Take 1 tablet by mouth every 6 (six) hours as needed for pain.     QUEtiapine 300 MG tablet  Commonly known as:  SEROQUEL  Take 1 tablet (300 mg total) by mouth 2 (two) times daily. Take 300 mg by mouth 2 times daily.     saccharomyces boulardii 250 MG capsule  Commonly known as:  FLORASTOR  Take 1 capsule (250 mg total) by mouth 2 (two) times daily.     sitaGLIPtin 50 MG tablet  Commonly known as:  JANUVIA  Take 1 tablet (50 mg total) by mouth 2 (two) times daily. Take 50 mg by mouth 2 times daily.     sulfamethoxazole-trimethoprim 800-160 MG per tablet  Commonly known as:  BACTRIM DS  Take 2 tablets by mouth 2 (two) times daily. Take 2 tablets by mouth 2 times daily for 28 days.     zolpidem 5 MG tablet  Commonly known as:  AMBIEN  Take 1 tablet (5 mg total) by mouth at bedtime as needed for sleep (insomnia).           Follow-up Information   Schedule an appointment as soon as possible for a visit with Ron Parker, MD.   Specialty:  Internal Medicine   Contact information:   [address] [place] [address] [phone]        The results of significant diagnostics from this hospitalization (including imaging, microbiology, ancillary and laboratory) are listed below for reference.    Significant Diagnostic Studies: Ct Abdomen Pelvis W Contrast  03/08/2013   CLINICAL DATA:  Abdominal pain, rising lipase, history of pancreatitis  EXAM: CT ABDOMEN AND PELVIS WITH CONTRAST  TECHNIQUE: Multidetector CT imaging of the abdomen and pelvis was performed using the standard protocol following bolus administration of intravenous contrast.  CONTRAST:  OMNIPAQUE IOHEXOL 300 MG/ML  SOLN  COMPARISON:   09/02/2012 and 12/01/2012  FINDINGS: Lung  bases are unremarkable. Sagittal images of the spine are unremarkable.  Heart size is within normal limits. Enhanced liver shows no biliary ductal dilatation. The patient is status postcholecystectomy. The spleen and adrenal glands are unremarkable. There is normal enhancement of the pancreas. There is minimal stranding of peripancreatic fat in distal body and tail of the pancreas as seen in axial image 30 and sagittal image 65. Very mild pancreatitis cannot be excluded. Clinical correlation is necessary. No peripancreatic fluid. No pancreatic pseudocysts.  Kidneys are symmetrical in size and enhancement.  Again noted nonobstructive calcified calculus mid pole of the right kidney measures 6.4 mm.  Nonobstructive calcified calculus in lower pole of the right kidney measures 2 mm the  No calcified ureteral calculi are noted. No small bowel obstruction. No ascites or free air. No adenopathy. There is no pericecal inflammation. Normal appendix is clearly visualize in axial image 60.  Retroflexed uterus is noted. No adnexal mass. Trace free fluid is noted in posterior of call the site. The urinary bladder is empty limiting its assessment. No inguinal adenopathy. No destructive bony lesions are noted within pelvis.  Small nonspecific bilateral inguinal lymph nodes.  IMPRESSION: 1. There is normal enhancement of the pancreas. There is minimal stranding of the peripancreatic fat especially in the body and tail of the pancreas. Minimal pancreatitis cannot be excluded. Clinical correlation is necessary. 2. Status postcholecystectomy. 3. No pancreatic pseudocyst or peripancreatic fluid is noted. 4. Normal appendix. No pericecal inflammation. 5. Retroflexed uterus. Trace pelvic free fluid.   Electronically Signed   By: Natasha Mead   On: 03/08/2013 12:31    Microbiology: Recent Results (from the past 240 hour(s))  URINE CULTURE     Status: None   Collection Time    03/07/13  1:18  AM      Result Value Range Status   Specimen Description URINE, RANDOM   Final   Special Requests NONE   Final   Culture  Setup Time     Final   Value: 03/07/2013 18:07     Performed at [facility] Count     Final   Value: 85,000 COLONIES/ML     Performed at [facility]   Culture     Final   Value: Multiple bacterial morphotypes present, none predominant. Suggest appropriate recollection if clinically indicated.     Performed at [facility]   Report Status 03/08/2013 FINAL   Final  MRSA PCR SCREENING     Status: None   Collection Time    03/08/13  8:36 AM      Result Value Range Status   MRSA by PCR NEGATIVE  NEGATIVE Final   Comment:            The GeneXpert MRSA Assay (FDA     approved for NASAL specimens     only), is one component of a     comprehensive MRSA colonization     surveillance program. It is not     intended to diagnose MRSA     infection nor to guide or     monitor treatment for     MRSA infections.     Labs: Basic Metabolic Panel:  Recent Labs Lab 03/07/13 0125 03/07/13 0556 03/08/13 0355 03/09/13 1010 03/10/13 0348 03/11/13 0340  NA 136 139 135  --  138 135  K 3.3* 3.2* 3.4*  --  3.7 4.2  CL 99 103 102  --  104 99  CO2  27 27 24   --  24 25  GLUCOSE 230* 228* 181*  --  211* 346*  BUN 13 10 10   --  8 11  CREATININE 0.45* 0.48* 0.52  --  0.62 0.53  CALCIUM 9.0 8.3* 8.8  --  8.8 9.4  MG  --   --   --  1.6  --   --    Liver Function Tests:  Recent Labs Lab 03/05/13 2145 03/06/13 0708 03/07/13 0125  AST 14 16 47*  ALT 6 5 21   ALKPHOS 68 58 61  BILITOT 0.3 0.3 0.5  PROT 8.6* 8.6* 8.1  ALBUMIN 4.3 4.0 3.7    Recent Labs Lab 03/06/13 0708 03/07/13 0125 03/08/13 0355 03/10/13 0348 03/12/13 0341  LIPASE 47 122* 133* 119* 126*   No results found for this basename: AMMONIA,  in the last 168 hours CBC:  Recent Labs Lab 03/05/13 2145 03/06/13 0708 03/07/13 0125 03/07/13 0556 03/11/13 0340  WBC  10.7* 9.9 9.7 8.7 7.8  NEUTROABS  --  8.6* 7.7  --   --   HGB 9.8* 9.2* 9.0* 9.2* 8.5*  HCT 31.6* 29.3* 29.8* 30.3* 29.2*  MCV 58.3* 58.4* 59.6* 60.2* 61.2*  PLT 225 196 177 179 143*   Cardiac Enzymes: No results found for this basename: CKTOTAL, CKMB, CKMBINDEX, TROPONINI,  in the last 168 hours BNP: BNP (last 3 results) No results found for this basename: PROBNP,  in the last 8760 hours CBG:  Recent Labs Lab 03/11/13 0716 03/11/13 1146 03/11/13 1632 03/11/13 2221 03/12/13 0742  GLUCAP 387* 292* 299* 334* 362*    Time coordinating discharge: Over 30 minutes

## 2013-03-12 NOTE — Progress Notes (Signed)
This RN went over d/c instructions with pt. Instructed to finish antibiotics and to follow-up with Dr. Lovell Sheehan. PIV removed, WNL. No change in pt condition since AM assessment. Pt informed that she has been discharged. Pt stated, "the doctor said that I can stay until 6pm tonight, and I will be staying until then." Pt d/c'd to home with family. Eugene Garnet RN

## 2013-03-12 NOTE — Progress Notes (Addendum)
Pt refused noon CBG check. Pt states, "I've been discharged." Pt also refused afternoon vital signs. Eugene Garnet RN

## 2013-03-12 NOTE — Progress Notes (Signed)
CRITICAL VALUE ALERT  Critical value received:  Hgb 6.7   Date of notification:  03/12/13  Time of notification:  1113   Critical value read back:yes  Nurse who received alert:  Vikki Ports  MD notified (1st page):  Dr. Sharl Ma  Time of first page:  1117   MD notified (2nd page):  Time of second page:  Responding MD:  Dr. Sharl Ma  Time MD responded:  [phone]

## 2013-03-21 ENCOUNTER — Emergency Department (HOSPITAL_COMMUNITY)
Admission: EM | Admit: 2013-03-21 | Discharge: 2013-03-21 | Disposition: A | Discharge status: Home / Self Care | Payer: PRIVATE HEALTH INSURANCE | Attending: Emergency Medicine | Admitting: Emergency Medicine

## 2013-03-21 ENCOUNTER — Encounter (HOSPITAL_COMMUNITY): Payer: Self-pay

## 2013-03-21 ENCOUNTER — Inpatient Hospital Stay (HOSPITAL_COMMUNITY)
Admission: EM | Admit: 2013-03-21 | Discharge: 2013-03-24 | DRG: 392 | Disposition: A | Discharge status: Home / Self Care | Payer: PRIVATE HEALTH INSURANCE | Attending: Internal Medicine | Admitting: Internal Medicine

## 2013-03-21 ENCOUNTER — Encounter (HOSPITAL_COMMUNITY): Payer: Self-pay | Admitting: Emergency Medicine

## 2013-03-21 DIAGNOSIS — K219 Gastro-esophageal reflux disease without esophagitis: Secondary | ICD-10-CM | POA: Diagnosis present

## 2013-03-21 DIAGNOSIS — Z79899 Other long term (current) drug therapy: Secondary | ICD-10-CM

## 2013-03-21 DIAGNOSIS — Z8742 Personal history of other diseases of the female genital tract: Secondary | ICD-10-CM | POA: Insufficient documentation

## 2013-03-21 DIAGNOSIS — F17200 Nicotine dependence, unspecified, uncomplicated: Secondary | ICD-10-CM | POA: Insufficient documentation

## 2013-03-21 DIAGNOSIS — E119 Type 2 diabetes mellitus without complications: Secondary | ICD-10-CM | POA: Insufficient documentation

## 2013-03-21 DIAGNOSIS — Z87440 Personal history of urinary (tract) infections: Secondary | ICD-10-CM | POA: Insufficient documentation

## 2013-03-21 DIAGNOSIS — D509 Iron deficiency anemia, unspecified: Secondary | ICD-10-CM | POA: Diagnosis present

## 2013-03-21 DIAGNOSIS — G4700 Insomnia, unspecified: Secondary | ICD-10-CM | POA: Diagnosis present

## 2013-03-21 DIAGNOSIS — R109 Unspecified abdominal pain: Secondary | ICD-10-CM

## 2013-03-21 DIAGNOSIS — Z765 Malingerer [conscious simulation]: Secondary | ICD-10-CM

## 2013-03-21 DIAGNOSIS — Z862 Personal history of diseases of the blood and blood-forming organs and certain disorders involving the immune mechanism: Secondary | ICD-10-CM | POA: Insufficient documentation

## 2013-03-21 DIAGNOSIS — G8929 Other chronic pain: Secondary | ICD-10-CM

## 2013-03-21 DIAGNOSIS — R1032 Left lower quadrant pain: Secondary | ICD-10-CM | POA: Diagnosis present

## 2013-03-21 DIAGNOSIS — Z8709 Personal history of other diseases of the respiratory system: Secondary | ICD-10-CM | POA: Insufficient documentation

## 2013-03-21 DIAGNOSIS — R630 Anorexia: Secondary | ICD-10-CM | POA: Insufficient documentation

## 2013-03-21 DIAGNOSIS — I10 Essential (primary) hypertension: Secondary | ICD-10-CM | POA: Diagnosis present

## 2013-03-21 DIAGNOSIS — Z3202 Encounter for pregnancy test, result negative: Secondary | ICD-10-CM | POA: Insufficient documentation

## 2013-03-21 DIAGNOSIS — R112 Nausea with vomiting, unspecified: Principal | ICD-10-CM | POA: Diagnosis present

## 2013-03-21 DIAGNOSIS — T40605A Adverse effect of unspecified narcotics, initial encounter: Secondary | ICD-10-CM | POA: Diagnosis present

## 2013-03-21 DIAGNOSIS — Z794 Long term (current) use of insulin: Secondary | ICD-10-CM | POA: Insufficient documentation

## 2013-03-21 DIAGNOSIS — Z91199 Patient's noncompliance with other medical treatment and regimen due to unspecified reason: Secondary | ICD-10-CM

## 2013-03-21 DIAGNOSIS — F411 Generalized anxiety disorder: Secondary | ICD-10-CM | POA: Diagnosis present

## 2013-03-21 DIAGNOSIS — F319 Bipolar disorder, unspecified: Secondary | ICD-10-CM | POA: Diagnosis present

## 2013-03-21 DIAGNOSIS — G894 Chronic pain syndrome: Secondary | ICD-10-CM | POA: Diagnosis present

## 2013-03-21 DIAGNOSIS — M109 Gout, unspecified: Secondary | ICD-10-CM | POA: Diagnosis present

## 2013-03-21 DIAGNOSIS — F19939 Other psychoactive substance use, unspecified with withdrawal, unspecified: Secondary | ICD-10-CM | POA: Diagnosis present

## 2013-03-21 DIAGNOSIS — R739 Hyperglycemia, unspecified: Secondary | ICD-10-CM

## 2013-03-21 DIAGNOSIS — Z9119 Patient's noncompliance with other medical treatment and regimen: Secondary | ICD-10-CM

## 2013-03-21 DIAGNOSIS — F1120 Opioid dependence, uncomplicated: Secondary | ICD-10-CM | POA: Diagnosis present

## 2013-03-21 DIAGNOSIS — F3130 Bipolar disorder, current episode depressed, mild or moderate severity, unspecified: Secondary | ICD-10-CM | POA: Insufficient documentation

## 2013-03-21 DIAGNOSIS — A0472 Enterocolitis due to Clostridium difficile, not specified as recurrent: Secondary | ICD-10-CM | POA: Diagnosis present

## 2013-03-21 DIAGNOSIS — F1910 Other psychoactive substance abuse, uncomplicated: Secondary | ICD-10-CM | POA: Diagnosis present

## 2013-03-21 DIAGNOSIS — K449 Diaphragmatic hernia without obstruction or gangrene: Secondary | ICD-10-CM | POA: Diagnosis present

## 2013-03-21 LAB — CBC WITH DIFFERENTIAL/PLATELET
Basophils Absolute: 0.1 10*3/uL (ref 0.0–0.1)
Eosinophils Absolute: 0.1 10*3/uL (ref 0.0–0.7)
Lymphocytes Relative: 12 % (ref 12–46)
Monocytes Relative: 5 % (ref 3–12)
Neutrophils Relative %: 81 % — ABNORMAL HIGH (ref 43–77)
Platelets: 477 10*3/uL — ABNORMAL HIGH (ref 150–400)
RDW: 21 % — ABNORMAL HIGH (ref 11.5–15.5)
WBC: 10 10*3/uL (ref 4.0–10.5)

## 2013-03-21 LAB — COMPREHENSIVE METABOLIC PANEL
ALT: 12 U/L (ref 0–35)
Albumin: 3.8 g/dL (ref 3.5–5.2)
Alkaline Phosphatase: 68 U/L (ref 39–117)
Calcium: 9 mg/dL (ref 8.4–10.5)
Potassium: 3.5 mEq/L (ref 3.5–5.1)
Sodium: 136 mEq/L (ref 135–145)
Total Protein: 8.4 g/dL — ABNORMAL HIGH (ref 6.0–8.3)

## 2013-03-21 LAB — URINALYSIS, ROUTINE W REFLEX MICROSCOPIC
Bilirubin Urine: NEGATIVE
Nitrite: NEGATIVE
Specific Gravity, Urine: 1.032 — ABNORMAL HIGH (ref 1.005–1.030)
Urobilinogen, UA: 1 mg/dL (ref 0.0–1.0)
pH: 6.5 (ref 5.0–8.0)

## 2013-03-21 LAB — GLUCOSE, CAPILLARY: Glucose-Capillary: 329 mg/dL — ABNORMAL HIGH (ref 70–99)

## 2013-03-21 LAB — POCT PREGNANCY, URINE: Preg Test, Ur: NEGATIVE

## 2013-03-21 LAB — URINE MICROSCOPIC-ADD ON

## 2013-03-21 LAB — LIPASE, BLOOD: Lipase: 52 U/L (ref 11–59)

## 2013-03-21 MED ORDER — SODIUM CHLORIDE 0.9 % IV BOLUS (SEPSIS)
1000.0000 mL | Freq: Once | INTRAVENOUS | Status: AC
  Administered 2013-03-21: 1000 mL via INTRAVENOUS

## 2013-03-21 MED ORDER — PROMETHAZINE HCL 25 MG/ML IJ SOLN
25.0000 mg | Freq: Once | INTRAMUSCULAR | Status: AC
  Administered 2013-03-22: 25 mg via INTRAVENOUS
  Filled 2013-03-21: qty 1

## 2013-03-21 MED ORDER — HYDROMORPHONE HCL PF 1 MG/ML IJ SOLN
1.0000 mg | Freq: Once | INTRAMUSCULAR | Status: AC
  Administered 2013-03-22: 1 mg via INTRAVENOUS
  Filled 2013-03-21: qty 1

## 2013-03-21 MED ORDER — KETOROLAC TROMETHAMINE 30 MG/ML IJ SOLN
30.0000 mg | Freq: Once | INTRAMUSCULAR | Status: AC
  Administered 2013-03-21: 30 mg via INTRAVENOUS
  Filled 2013-03-21: qty 1

## 2013-03-21 MED ORDER — SODIUM CHLORIDE 0.9 % IV BOLUS (SEPSIS)
1000.0000 mL | Freq: Once | INTRAVENOUS | Status: AC
  Administered 2013-03-22: 1000 mL via INTRAVENOUS

## 2013-03-21 MED ORDER — PROMETHAZINE HCL 25 MG/ML IJ SOLN
25.0000 mg | Freq: Once | INTRAMUSCULAR | Status: AC
  Administered 2013-03-21: 25 mg via INTRAVENOUS
  Filled 2013-03-21: qty 1

## 2013-03-21 NOTE — ED Notes (Signed)
Pt c/o emesis x 15 in last 24 hours, abd pain and diarrhea x 4-5 in last 24 hours. A & O, NAD

## 2013-03-21 NOTE — ED Provider Notes (Signed)
Medical screening examination/treatment/procedure(s) were performed by non-physician practitioner and as supervising physician I was immediately available for consultation/collaboration.  Olivia Mackie, MD 03/21/13 [phone]

## 2013-03-21 NOTE — ED Provider Notes (Signed)
CSN: [id number]     Arrival date & time 03/21/13  2149 History   First MD Initiated Contact with Patient 03/21/13 2302     Chief Complaint  Patient presents with  . Abdominal Pain    boil left thigh   (Consider location/radiation/quality/duration/timing/severity/associated sxs/prior Treatment) HPI  37 year old female with history of chronic abdominal pain, diabetes, depression anxiety who is well-known to the ER presents complaining of abdominal pain. Patient was seen earlier this morning for complaint of abdominal pain and waxing and waning for the past 5-6 days ago after she was discharged from hospital for same. States he has not been able to eat or drink anything for the past 2-3 days. Also report having nausea vomiting and diarrhea. Reports pain as a sharp stabbing pain to the lower abdomen, 10 out of 10, nothing makes it better or worse. No complaints of fever, chills, chest pain or shortness of breath, dysuria, hematuria, back pain or rash. She was treated this morning in discharge however states she's unable to take any of her medication and continued to endorse pain.  Past Medical History  Diagnosis Date  . Diabetes mellitus   . Anemia   . Headache(784.0)   . Seasonal allergies   . Abnormal Pap smear   . Gout   . Depression   . Anxiety   . UTI (urinary tract infection)   . Bipolar 1 disorder    Past Surgical History  Procedure Laterality Date  . Cholecystectomy    . Cesarean section    . Esophagogastroduodenoscopy N/A 09/07/2012    Procedure: ESOPHAGOGASTRODUODENOSCOPY (EGD);  Surgeon: Charna Elizabeth, MD;  Location: [facility] ENDOSCOPY;  Service: Endoscopy;  Laterality: N/A;   Family History  Problem Relation Age of Onset  . Hypertension Mother   . Diabetes Mother   . Hypertension Maternal Aunt   . Diabetes Maternal Aunt   . Hypertension Maternal Uncle   . Diabetes Maternal Uncle   . Hypertension Maternal Grandmother    History  Substance Use Topics  . Smoking status:  Current Every Day Smoker -- 0.50 packs/day    Types: Cigarettes  . Smokeless tobacco: Never Used  . Alcohol Use: No   OB History   Grav Para Term Preterm Abortions TAB SAB Ect Mult Living   8 8 7 1  0  0   7     Review of Systems  Respiratory: Negative for shortness of breath.   Cardiovascular: Negative for chest pain.  Gastrointestinal: Positive for abdominal pain.  All other systems reviewed and are negative.    Allergies  Reglan and Zofran  Home Medications   Current Outpatient Rx  Name  Route  Sig  Dispense  Refill  . alprazolam (XANAX) 2 MG tablet   Oral   Take 2 mg by mouth 3 (three) times daily as needed for sleep or anxiety.         Marland Kitchen amLODipine (NORVASC) 5 MG tablet   Oral   Take 5 mg by mouth every morning.         Marland Kitchen amphetamine-dextroamphetamine (ADDERALL) 30 MG tablet   Oral   Take 30 mg by mouth 2 (two) times daily. Take 30 mg by mouth 2 times daily.         . famotidine (PEPCID) 20 MG tablet   Oral   Take 1 tablet (20 mg total) by mouth daily.   30 tablet   3   . fluconazole (DIFLUCAN) 100 MG tablet   Oral   Take  1 tablet (100 mg total) by mouth daily.   5 tablet   0   . HYDROmorphone (DILAUDID) 2 MG tablet   Oral   Take 1 tablet (2 mg total) by mouth every 4 (four) hours as needed for pain.   10 tablet   0   . insulin aspart (NOVOLOG) 100 UNIT/ML injection   Subcutaneous   Inject 10-20 Units into the skin 3 (three) times daily before meals. Sliding scale         . insulin glargine (LANTUS) 100 UNIT/ML injection   Subcutaneous   Inject 0.7 mLs (70 Units total) into the skin at bedtime.   10 mL   12   . metroNIDAZOLE (FLAGYL) 500 MG tablet   Oral   Take 1 tablet (500 mg total) by mouth every 8 (eight) hours.   12 tablet   0   . mupirocin ointment (BACTROBAN) 2 %   Topical   Apply topically 2 (two) times daily. On thigh boil; use for 2 weeks   22 g   0   . oxyCODONE-acetaminophen (PERCOCET) 10-325 MG per tablet   Oral    Take 1 tablet by mouth every 6 (six) hours as needed for pain.   120 tablet   0   . QUEtiapine (SEROQUEL) 300 MG tablet   Oral   Take 1 tablet (300 mg total) by mouth 2 (two) times daily. Take 300 mg by mouth 2 times daily.   60 tablet   0   . saccharomyces boulardii (FLORASTOR) 250 MG capsule   Oral   Take 1 capsule (250 mg total) by mouth 2 (two) times daily.   60 capsule   3   . sitaGLIPtin (JANUVIA) 50 MG tablet   Oral   Take 1 tablet (50 mg total) by mouth 2 (two) times daily. Take 50 mg by mouth 2 times daily.   60 tablet   3   . sulfamethoxazole-trimethoprim (BACTRIM DS) 800-160 MG per tablet   Oral   Take 2 tablets by mouth 2 (two) times daily. Take 2 tablets by mouth 2 times daily for 28 days.   14 tablet   0   . zolpidem (AMBIEN) 5 MG tablet   Oral   Take 1 tablet (5 mg total) by mouth at bedtime as needed for sleep (insomnia).   30 tablet   0    BP 180/64  Pulse 84  Temp(Src) 98.7 F (37.1 C) (Oral)  Resp 18  SpO2 100%  LMP 02/23/2013 Physical Exam  Nursing note and vitals reviewed. Constitutional: She is oriented to person, place, and time. She appears well-developed and well-nourished. No distress.  Awake, alert, nontoxic appearance  HENT:  Head: Atraumatic.  Mouth/Throat: Oropharynx is clear and moist.  Eyes: Conjunctivae are normal. Right eye exhibits no discharge. Left eye exhibits no discharge.  Neck: Neck supple.  Cardiovascular: Normal rate and regular rhythm.   Pulmonary/Chest: Effort normal. No respiratory distress. She exhibits no tenderness.  Abdominal: Soft. Bowel sounds are normal. She exhibits no distension. There is tenderness (Left low abdominal tenderness without guarding or rebound tenderness. Abdomen is otherwise soft with normal bowel sounds.). There is no rebound.  Musculoskeletal: She exhibits no tenderness.  ROM appears intact, no obvious focal weakness  Neurological: She is alert and oriented to person, place, and time.   Mental status and motor strength appears intact  Skin: No rash noted.  Psychiatric: She has a normal mood and affect.    ED Course  Procedures (including critical care time)  12:33 AM Patient with acute on chronic abdominal pain with associate nausea vomiting and diarrhea. States she's unable to keep any medication down. Seen earlier today for the same complaint. Patient received pain medication along with antinausea medication here in the ER but states she felt she may need admission for further management of her conditions due to inability to tolerates by mouth.  I have discussed with my attending.  Plan to consult [facility] for admission.    Has hx of pancreatitis, normal lipase this AM.    12:59 AM Pt report no improvement of sxs after receiving treatment.  I have consulted with [facility], Dr. Gwenlyn Perking who will see pt in ER and will consider dispo.    Labs Review Labs Reviewed  CBC WITH DIFFERENTIAL - Abnormal; Notable for the following:    RBC 5.20 (*)    Hemoglobin 9.4 (*)    HCT 31.2 (*)    MCV 60.0 (*)    MCH 18.1 (*)    RDW 21.0 (*)    Platelets 485 (*)    All other components within normal limits  COMPREHENSIVE METABOLIC PANEL - Abnormal; Notable for the following:    Glucose, Bld 341 (*)    Creatinine, Ser 0.38 (*)    All other components within normal limits  CBC - Abnormal; Notable for the following:    RBC 5.13 (*)    Hemoglobin 9.3 (*)    HCT 30.8 (*)    MCV 60.0 (*)    MCH 18.1 (*)    RDW 20.6 (*)    Platelets 428 (*)    All other components within normal limits  BASIC METABOLIC PANEL - Abnormal; Notable for the following:    Potassium 3.3 (*)    Glucose, Bld 359 (*)    Creatinine, Ser 0.47 (*)    All other components within normal limits  GLUCOSE, CAPILLARY - Abnormal; Notable for the following:    Glucose-Capillary 335 (*)    All other components within normal limits  GLUCOSE, CAPILLARY - Abnormal; Notable for the following:     Glucose-Capillary 272 (*)    All other components within normal limits  POCT I-STAT, CHEM 8 - Abnormal; Notable for the following:    Creatinine, Ser 0.40 (*)    Glucose, Bld 350 (*)    Calcium, Ion 1.11 (*)    Hemoglobin 11.9 (*)    HCT 35.0 (*)    All other components within normal limits   Imaging Review Acute Abdominal Series  03/22/2013   CLINICAL DATA:  Abdominal pain. Nausea and vomiting and diarrhea.  EXAM: ACUTE ABDOMEN SERIES (ABDOMEN 2 VIEW & CHEST 1 VIEW)  COMPARISON:  CT, 03/08/2013.  FINDINGS: There is no evidence of dilated bowel loops or free intraperitoneal air. No radiopaque calculi or other significant radiographic abnormality is seen. There has been a prior cholecystectomy. Heart size and mediastinal contours are within normal limits. Both lungs are clear.  IMPRESSION: Negative abdominal radiographs.  No acute cardiopulmonary disease.   Electronically Signed   By: Amie Portland   On: 03/22/2013 14:06    MDM   1. Abdominal pain, chronic, left lower quadrant   2. Nausea vomiting and diarrhea   3. Hyperglycemia without ketosis   4. Abdominal pain   5. Clostridium difficile colitis   6. Esophageal reflux   7. HTN (hypertension)   8. Microcytic anemia   9. Nausea & vomiting   10. Tobacco use disorder  11. Type II or unspecified type diabetes mellitus without mention of complication, not stated as uncontrolled    BP 126/72  Pulse 109  Temp(Src) 98.3 F (36.8 C) (Oral)  Resp 16  Ht 5\' 6"  (1.676 m)  Wt 190 lb (86.183 kg)  BMI 30.68 kg/m2  SpO2 100%  LMP 02/23/2013  I have reviewed nursing notes and vital signs. I personally reviewed the imaging tests through PACS system  I reviewed available ER/hospitalization records thought the EMR     Fayrene Helper, [place] 03/22/13 1502

## 2013-03-21 NOTE — ED Notes (Signed)
Pt discharged this morning for n,v,d, and abd pain. States she hasn't been able to take her medication. CBG=345.

## 2013-03-21 NOTE — ED Provider Notes (Signed)
CSN: [id number]     Arrival date & time 03/21/13  0451 History   First MD Initiated Contact with Patient 03/21/13 [phone]     Chief Complaint  Patient presents with  . Abdominal Pain   (Consider location/radiation/quality/duration/timing/severity/associated sxs/prior Treatment) HPI Comments: 37 year old female with a past medical history of diabetes, bipolar, chronic abdominal pain and nausea presents to the emergency department complaining of continuing abdominal pain, nausea and vomiting since being seen in the emergency department on September 13 and admitted into the hospital. Patient states the abdominal pain after discharge from the hospital went away for short period of time and returned about 5 days ago. Admits to associated nausea multiple episodes of vomiting. She has not been able to eat anything for the past 2 days. Despite triage summary, patient states she has had normal bowel movements and no diarrhea. Abdominal pain located in the left lower quadrant, nonradiating described as sharp and stabbing rated 10 out of 10. She has not had any alleviating factors for her pain. Nothing specific makes her pain worse. Denies increased urinary frequency, urgency or dysuria. Last menstrual period was September 1. Denies vaginal bleeding or discharge. Denies back pain, fever or chills.  Patient is a 37 y.o. female presenting with abdominal pain. The history is provided by the patient.  Abdominal Pain Associated symptoms: nausea and vomiting   Associated symptoms: no chest pain, no chills, no diarrhea, no fever and no shortness of breath     Past Medical History  Diagnosis Date  . Diabetes mellitus   . Anemia   . Headache(784.0)   . Seasonal allergies   . Abnormal Pap smear   . Gout   . Depression   . Anxiety   . UTI (urinary tract infection)   . Bipolar 1 disorder    Past Surgical History  Procedure Laterality Date  . Cholecystectomy    . Cesarean section    .  Esophagogastroduodenoscopy N/A 09/07/2012    Procedure: ESOPHAGOGASTRODUODENOSCOPY (EGD);  Surgeon: Charna Elizabeth, MD;  Location: [facility] ENDOSCOPY;  Service: Endoscopy;  Laterality: N/A;   Family History  Problem Relation Age of Onset  . Hypertension Mother   . Diabetes Mother   . Hypertension Maternal Aunt   . Diabetes Maternal Aunt   . Hypertension Maternal Uncle   . Diabetes Maternal Uncle   . Hypertension Maternal Grandmother    History  Substance Use Topics  . Smoking status: Current Every Day Smoker -- 0.50 packs/day    Types: Cigarettes  . Smokeless tobacco: Never Used  . Alcohol Use: No   OB History   Grav Para Term Preterm Abortions TAB SAB Ect Mult Living   8 8 7 1  0  0   7     Review of Systems  Constitutional: Positive for appetite change. Negative for fever and chills.  Respiratory: Negative for shortness of breath.   Cardiovascular: Negative for chest pain.  Gastrointestinal: Positive for nausea, vomiting and abdominal pain. Negative for diarrhea and blood in stool.  Genitourinary: Negative.   Musculoskeletal: Negative for back pain.  All other systems reviewed and are negative.    Allergies  Reglan and Zofran  Home Medications   Current Outpatient Rx  Name  Route  Sig  Dispense  Refill  . alprazolam (XANAX) 2 MG tablet   Oral   Take 2 mg by mouth 3 (three) times daily as needed for sleep or anxiety.         Marland Kitchen amLODipine (NORVASC) 5 MG  tablet   Oral   Take 5 mg by mouth every morning.         . fluconazole (DIFLUCAN) 100 MG tablet   Oral   Take 1 tablet (100 mg total) by mouth daily.   5 tablet   0   . metroNIDAZOLE (FLAGYL) 500 MG tablet   Oral   Take 1 tablet (500 mg total) by mouth every 8 (eight) hours.   12 tablet   0   . mupirocin ointment (BACTROBAN) 2 %   Topical   Apply topically 2 (two) times daily. On thigh boil; use for 2 weeks   22 g   0   . sulfamethoxazole-trimethoprim (BACTRIM DS) 800-160 MG per tablet   Oral   Take 2  tablets by mouth 2 (two) times daily. Take 2 tablets by mouth 2 times daily for 28 days.   14 tablet   0   . zolpidem (AMBIEN) 5 MG tablet   Oral   Take 1 tablet (5 mg total) by mouth at bedtime as needed for sleep (insomnia).   30 tablet   0   . amphetamine-dextroamphetamine (ADDERALL) 30 MG tablet   Oral   Take 30 mg by mouth 2 (two) times daily. Take 30 mg by mouth 2 times daily.         . famotidine (PEPCID) 20 MG tablet   Oral   Take 1 tablet (20 mg total) by mouth daily.   30 tablet   3   . HYDROmorphone (DILAUDID) 2 MG tablet   Oral   Take 1 tablet (2 mg total) by mouth every 4 (four) hours as needed for pain.   10 tablet   0   . insulin aspart (NOVOLOG) 100 UNIT/ML injection   Subcutaneous   Inject 10-20 Units into the skin 3 (three) times daily before meals. Sliding scale         . insulin glargine (LANTUS) 100 UNIT/ML injection   Subcutaneous   Inject 0.7 mLs (70 Units total) into the skin at bedtime.   10 mL   12   . oxyCODONE-acetaminophen (PERCOCET) 10-325 MG per tablet   Oral   Take 1 tablet by mouth every 6 (six) hours as needed for pain.   120 tablet   0   . QUEtiapine (SEROQUEL) 300 MG tablet   Oral   Take 1 tablet (300 mg total) by mouth 2 (two) times daily. Take 300 mg by mouth 2 times daily.   60 tablet   0   . saccharomyces boulardii (FLORASTOR) 250 MG capsule   Oral   Take 1 capsule (250 mg total) by mouth 2 (two) times daily.   60 capsule   3   . sitaGLIPtin (JANUVIA) 50 MG tablet   Oral   Take 1 tablet (50 mg total) by mouth 2 (two) times daily. Take 50 mg by mouth 2 times daily.   60 tablet   3    BP 147/70  Pulse 115  Temp(Src) 99.4 F (37.4 C) (Oral)  Resp 16  Ht 5\' 6"  (1.676 m)  Wt 190 lb (86.183 kg)  BMI 30.68 kg/m2  SpO2 100%  LMP 02/23/2013 Physical Exam  Nursing note and vitals reviewed. Constitutional: She is oriented to person, place, and time. She appears well-developed and well-nourished. No distress.   Resting comfortably on exam bed.  HENT:  Head: Normocephalic and atraumatic.  Mouth/Throat: Oropharynx is clear and moist.  Eyes: Conjunctivae and EOM are normal. Pupils are equal,  round, and reactive to light. No scleral icterus.  Neck: Normal range of motion. Neck supple.  Cardiovascular: Normal rate, regular rhythm and normal heart sounds.   Pulmonary/Chest: Effort normal and breath sounds normal.  Abdominal: Soft. Normal appearance and bowel sounds are normal. She exhibits no distension and no mass. There is tenderness in the left lower quadrant. There is no rigidity, no rebound, no guarding and no CVA tenderness.  No peritoneal signs.  Musculoskeletal: Normal range of motion. She exhibits no edema.  Neurological: She is alert and oriented to person, place, and time.  Skin: Skin is warm and dry. She is not diaphoretic.  Psychiatric: She has a normal mood and affect. Her behavior is normal.    ED Course  Procedures (including critical care time) Labs Review Labs Reviewed  CBC WITH DIFFERENTIAL - Abnormal; Notable for the following:    Hemoglobin 9.3 (*)    HCT 30.7 (*)    MCV 60.1 (*)    MCH 18.2 (*)    RDW 21.0 (*)    Platelets 477 (*)    Neutrophils Relative % 81 (*)    Neutro Abs 8.1 (*)    All other components within normal limits  COMPREHENSIVE METABOLIC PANEL - Abnormal; Notable for the following:    Glucose, Bld 318 (*)    Creatinine, Ser 0.37 (*)    Total Protein 8.4 (*)    All other components within normal limits  URINALYSIS, ROUTINE W REFLEX MICROSCOPIC - Abnormal; Notable for the following:    Specific Gravity, Urine 1.032 (*)    Glucose, UA >1000 (*)    Hgb urine dipstick TRACE (*)    Protein, ur 30 (*)    All other components within normal limits  GLUCOSE, CAPILLARY - Abnormal; Notable for the following:    Glucose-Capillary 329 (*)    All other components within normal limits  URINE MICROSCOPIC-ADD ON - Abnormal; Notable for the following:    Squamous  Epithelial / LPF FEW (*)    All other components within normal limits  GLUCOSE, CAPILLARY - Abnormal; Notable for the following:    Glucose-Capillary 289 (*)    All other components within normal limits  LIPASE, BLOOD  POCT PREGNANCY, URINE   Imaging Review No results found.  MDM   1. Abdominal pain   2. Nausea and vomiting   3. Hyperglycemia      Patient with abdominal pain, n/v. She is well appearing and in NAD with normal vital signs. Hx of multiple ED visits for same. CT scan on 9/14 showing possible minimal pancreatitis when she was admitted into the hospital. Lipase at that time 133, today 52. No mid-epigastric tenderness. Abdomen soft, non-distended, no peritoneal signs. Labs obtained in triage prior to patient being seen, CBC, CMP, lipase, CBG, pregnancy, UA. CBC and UA pending. CMP unremarkable other than glucose of 318. Will give fluid bolus, phenergan and re-assess. 7:55 AM No leukocytosis or UTI. CBG 289 after receiving fluids. No vomiting since receiving phenergan. toradol given for abdominal pain which she states "never helps". She tolerated PO fluid. Stable for discharge. Advised her to followup with her PCP regarding her chronic abdominal pain, nausea and vomiting. She has Phenergan at home. Return precautions discussed. Patient states understanding of treatment care plan and is agreeable.    Trevor Mace, PA-C 03/21/13 [phone]

## 2013-03-21 NOTE — ED Notes (Signed)
Pt states she has a boil on left inner thigh that has MRSA and will need to be looked at as well.

## 2013-03-22 ENCOUNTER — Observation Stay (HOSPITAL_COMMUNITY): Payer: PRIVATE HEALTH INSURANCE

## 2013-03-22 ENCOUNTER — Encounter (HOSPITAL_COMMUNITY): Payer: Self-pay | Admitting: Internal Medicine

## 2013-03-22 DIAGNOSIS — F17200 Nicotine dependence, unspecified, uncomplicated: Secondary | ICD-10-CM

## 2013-03-22 DIAGNOSIS — E119 Type 2 diabetes mellitus without complications: Secondary | ICD-10-CM

## 2013-03-22 DIAGNOSIS — A0472 Enterocolitis due to Clostridium difficile, not specified as recurrent: Secondary | ICD-10-CM | POA: Diagnosis present

## 2013-03-22 DIAGNOSIS — R109 Unspecified abdominal pain: Secondary | ICD-10-CM

## 2013-03-22 DIAGNOSIS — D509 Iron deficiency anemia, unspecified: Secondary | ICD-10-CM

## 2013-03-22 DIAGNOSIS — R1032 Left lower quadrant pain: Secondary | ICD-10-CM

## 2013-03-22 DIAGNOSIS — G8929 Other chronic pain: Secondary | ICD-10-CM

## 2013-03-22 DIAGNOSIS — K219 Gastro-esophageal reflux disease without esophagitis: Secondary | ICD-10-CM

## 2013-03-22 DIAGNOSIS — R112 Nausea with vomiting, unspecified: Principal | ICD-10-CM

## 2013-03-22 DIAGNOSIS — I10 Essential (primary) hypertension: Secondary | ICD-10-CM

## 2013-03-22 LAB — CBC
MCH: 18.1 pg — ABNORMAL LOW (ref 26.0–34.0)
MCHC: 30.2 g/dL (ref 30.0–36.0)
MCV: 60 fL — ABNORMAL LOW (ref 78.0–100.0)
Platelets: 428 10*3/uL — ABNORMAL HIGH (ref 150–400)
RDW: 20.6 % — ABNORMAL HIGH (ref 11.5–15.5)

## 2013-03-22 LAB — BASIC METABOLIC PANEL
Calcium: 8.7 mg/dL (ref 8.4–10.5)
Creatinine, Ser: 0.47 mg/dL — ABNORMAL LOW (ref 0.50–1.10)
GFR calc Af Amer: >90 mL/min (ref >90)
GFR calc non Af Amer: >90 mL/min (ref >90)
Glucose, Bld: 359 mg/dL — ABNORMAL HIGH (ref 70–99)
Potassium: 3.3 mEq/L — ABNORMAL LOW (ref 3.5–5.1)
Sodium: 137 mEq/L (ref 135–145)

## 2013-03-22 LAB — COMPREHENSIVE METABOLIC PANEL
AST: 21 U/L (ref 0–37)
BUN: 9 mg/dL (ref 6–23)
CO2: 21 mEq/L (ref 19–32)
Calcium: 9.1 mg/dL (ref 8.4–10.5)
Chloride: 99 mEq/L (ref 96–112)
Creatinine, Ser: 0.38 mg/dL — ABNORMAL LOW (ref 0.50–1.10)
GFR calc Af Amer: >90 mL/min (ref >90)
GFR calc non Af Amer: >90 mL/min (ref >90)
Glucose, Bld: 341 mg/dL — ABNORMAL HIGH (ref 70–99)
Total Bilirubin: 0.3 mg/dL (ref 0.3–1.2)
Total Protein: 8.1 g/dL (ref 6.0–8.3)

## 2013-03-22 LAB — CBC WITH DIFFERENTIAL/PLATELET
Basophils Relative: 1 % (ref 0–1)
Eosinophils Absolute: 0.1 10*3/uL (ref 0.0–0.7)
Eosinophils Relative: 1 % (ref 0–5)
HCT: 31.2 % — ABNORMAL LOW (ref 36.0–46.0)
Hemoglobin: 9.4 g/dL — ABNORMAL LOW (ref 12.0–15.0)
Lymphs Abs: 1.7 10*3/uL (ref 0.7–4.0)
MCH: 18.1 pg — ABNORMAL LOW (ref 26.0–34.0)
MCHC: 30.1 g/dL (ref 30.0–36.0)
MCV: 60 fL — ABNORMAL LOW (ref 78.0–100.0)
Monocytes Absolute: 0.5 10*3/uL (ref 0.1–1.0)
Neutro Abs: 6.5 10*3/uL (ref 1.7–7.7)
Neutrophils Relative %: 73 % (ref 43–77)
RBC: 5.2 MIL/uL — ABNORMAL HIGH (ref 3.87–5.11)

## 2013-03-22 LAB — POCT I-STAT, CHEM 8
BUN: 7 mg/dL (ref 6–23)
Calcium, Ion: 1.11 mmol/L — ABNORMAL LOW (ref 1.12–1.23)
Chloride: 106 mEq/L (ref 96–112)
Creatinine, Ser: 0.4 mg/dL — ABNORMAL LOW (ref 0.50–1.10)
Glucose, Bld: 350 mg/dL — ABNORMAL HIGH (ref 70–99)
TCO2: 21 mmol/L (ref 0–100)

## 2013-03-22 LAB — GLUCOSE, CAPILLARY
Glucose-Capillary: 335 mg/dL — ABNORMAL HIGH (ref 70–99)
Glucose-Capillary: 272 mg/dL — ABNORMAL HIGH (ref 70–99)
Glucose-Capillary: 406 mg/dL — ABNORMAL HIGH (ref 70–99)

## 2013-03-22 MED ORDER — QUETIAPINE FUMARATE 300 MG PO TABS
300.0000 mg | ORAL_TABLET | Freq: Two times a day (BID) | ORAL | Status: DC
  Administered 2013-03-22 – 2013-03-24 (×5): 300 mg via ORAL
  Filled 2013-03-22 (×7): qty 1

## 2013-03-22 MED ORDER — PROMETHAZINE HCL 25 MG/ML IJ SOLN
25.0000 mg | Freq: Four times a day (QID) | INTRAMUSCULAR | Status: DC | PRN
  Administered 2013-03-22 – 2013-03-24 (×9): 25 mg via INTRAVENOUS
  Filled 2013-03-22 (×9): qty 1

## 2013-03-22 MED ORDER — SODIUM CHLORIDE 0.9 % IV SOLN
INTRAVENOUS | Status: DC
  Administered 2013-03-22 – 2013-03-23 (×3): via INTRAVENOUS

## 2013-03-22 MED ORDER — METRONIDAZOLE IN NACL 5-0.79 MG/ML-% IV SOLN
500.0000 mg | Freq: Three times a day (TID) | INTRAVENOUS | Status: DC
  Administered 2013-03-22 – 2013-03-24 (×6): 500 mg via INTRAVENOUS
  Filled 2013-03-22 (×11): qty 100

## 2013-03-22 MED ORDER — NICOTINE 14 MG/24HR TD PT24
14.0000 mg | MEDICATED_PATCH | Freq: Every day | TRANSDERMAL | Status: DC
  Administered 2013-03-22 – 2013-03-24 (×3): 14 mg via TRANSDERMAL
  Filled 2013-03-22 (×3): qty 1

## 2013-03-22 MED ORDER — ZOLPIDEM TARTRATE 5 MG PO TABS: 5.0000 mg | ORAL_TABLET | Freq: Every evening | ORAL | Status: DC | PRN

## 2013-03-22 MED ORDER — KETOROLAC TROMETHAMINE 30 MG/ML IJ SOLN
30.0000 mg | Freq: Four times a day (QID) | INTRAMUSCULAR | Status: DC | PRN
  Administered 2013-03-22 – 2013-03-23 (×3): 30 mg via INTRAVENOUS
  Filled 2013-03-22 (×3): qty 1

## 2013-03-22 MED ORDER — AMPHETAMINE-DEXTROAMPHETAMINE 10 MG PO TABS
30.0000 mg | ORAL_TABLET | Freq: Two times a day (BID) | ORAL | Status: DC
  Administered 2013-03-22 – 2013-03-24 (×5): 30 mg via ORAL
  Filled 2013-03-22 (×5): qty 3

## 2013-03-22 MED ORDER — INSULIN ASPART 100 UNIT/ML ~~LOC~~ SOLN
0.0000 [IU] | Freq: Three times a day (TID) | SUBCUTANEOUS | Status: DC
  Administered 2013-03-22: 11 [IU] via SUBCUTANEOUS
  Administered 2013-03-22: 8 [IU] via SUBCUTANEOUS
  Administered 2013-03-23: 11 [IU] via SUBCUTANEOUS
  Administered 2013-03-23 (×2): 3 [IU] via SUBCUTANEOUS
  Administered 2013-03-24: 09:00:00 via SUBCUTANEOUS

## 2013-03-22 MED ORDER — ACETAMINOPHEN 650 MG RE SUPP: 650.0000 mg | Freq: Four times a day (QID) | RECTAL | Status: DC | PRN

## 2013-03-22 MED ORDER — AMLODIPINE BESYLATE 5 MG PO TABS
5.0000 mg | ORAL_TABLET | Freq: Every morning | ORAL | Status: DC
  Administered 2013-03-22 – 2013-03-23 (×2): 5 mg via ORAL
  Filled 2013-03-22 (×2): qty 1

## 2013-03-22 MED ORDER — ENOXAPARIN SODIUM 40 MG/0.4ML ~~LOC~~ SOLN
40.0000 mg | SUBCUTANEOUS | Status: DC
  Filled 2013-03-22 (×3): qty 0.4

## 2013-03-22 MED ORDER — FAMOTIDINE 40 MG PO TABS
40.0000 mg | ORAL_TABLET | Freq: Two times a day (BID) | ORAL | Status: DC
  Administered 2013-03-22 – 2013-03-24 (×5): 40 mg via ORAL
  Filled 2013-03-22 (×6): qty 1

## 2013-03-22 MED ORDER — ALPRAZOLAM 1 MG PO TABS: 1.0000 mg | ORAL_TABLET | Freq: Three times a day (TID) | ORAL | Status: DC | PRN

## 2013-03-22 MED ORDER — PROCHLORPERAZINE EDISYLATE 5 MG/ML IJ SOLN
10.0000 mg | Freq: Four times a day (QID) | INTRAMUSCULAR | Status: DC | PRN
  Administered 2013-03-22: 10 mg via INTRAVENOUS
  Filled 2013-03-22: qty 2

## 2013-03-22 MED ORDER — PROCHLORPERAZINE EDISYLATE 5 MG/ML IJ SOLN: 10.0000 mg | INTRAMUSCULAR | Status: DC | PRN

## 2013-03-22 MED ORDER — INSULIN GLARGINE 100 UNIT/ML ~~LOC~~ SOLN
35.0000 [IU] | Freq: Every day | SUBCUTANEOUS | Status: DC
Start: 2013-03-22 — End: 2013-03-22

## 2013-03-22 MED ORDER — SODIUM CHLORIDE 0.9 % IV BOLUS (SEPSIS): 1000.0000 mL | Freq: Once | INTRAVENOUS | Status: DC

## 2013-03-22 MED ORDER — PROCHLORPERAZINE EDISYLATE 5 MG/ML IJ SOLN
10.0000 mg | INTRAMUSCULAR | Status: DC | PRN
  Administered 2013-03-22: 10 mg via INTRAVENOUS
  Filled 2013-03-22: qty 2

## 2013-03-22 MED ORDER — HYDROMORPHONE HCL PF 1 MG/ML IJ SOLN
1.0000 mg | Freq: Once | INTRAMUSCULAR | Status: AC
  Administered 2013-03-22: 1 mg via INTRAVENOUS
  Filled 2013-03-22: qty 1

## 2013-03-22 MED ORDER — INSULIN GLARGINE 100 UNIT/ML ~~LOC~~ SOLN
50.0000 [IU] | Freq: Every day | SUBCUTANEOUS | Status: DC
  Administered 2013-03-22: 50 [IU] via SUBCUTANEOUS
  Filled 2013-03-22: qty 0.5

## 2013-03-22 MED ORDER — VANCOMYCIN 50 MG/ML ORAL SOLUTION
125.0000 mg | Freq: Four times a day (QID) | ORAL | Status: DC
  Filled 2013-03-22 (×6): qty 2.5

## 2013-03-22 MED ORDER — ACETAMINOPHEN 325 MG PO TABS: 650.0000 mg | ORAL_TABLET | Freq: Four times a day (QID) | ORAL | Status: DC | PRN

## 2013-03-22 MED ORDER — SODIUM CHLORIDE 0.9 % IJ SOLN
INTRAMUSCULAR | Status: AC
  Administered 2013-03-22: 20:00:00
  Filled 2013-03-22: qty 10

## 2013-03-22 MED ORDER — INSULIN ASPART 100 UNIT/ML ~~LOC~~ SOLN
15.0000 [IU] | Freq: Once | SUBCUTANEOUS | Status: AC
  Administered 2013-03-22: 15 [IU] via SUBCUTANEOUS

## 2013-03-22 NOTE — ED Notes (Signed)
Pt vomited an unknown amount of clear liquid on the floor.

## 2013-03-22 NOTE — Progress Notes (Signed)
Offered pt oral antibiotic.  Pt stated she did not feel like she would be able to keep down the medication.  Not vomiting or retching at this time.  Will continue to monitor.  Sherron Monday

## 2013-03-22 NOTE — Progress Notes (Signed)
[facility] PROGRESS NOTE  [name] ZOX:[id number] DOB: Sep 14, 1975 DOA: 03/21/2013 PCP: Jeri Cos, MD  Assessment/Plan:  1. N/V/Abd pain and Diarrhea -no further documented evidence of diarrhea -multiple episodes of vomiting this am, could be a component of opiate withdrawal contributing and hence given Dilaudid x1 only today. She takes Percocet and dilaudid everyday at home.  Vs recurrent Cdiff, no further diarrhea though -change Po vanc back to IV flagyl, will need to treat another full course, pt admits not taking any flagyl after discharge from last admission -I discussed our reasons to suspect opiate addiction with pt. -narcotic seeking behavior noted on multiple prior admissions -clear liquids, advance as tolerated -IVF, phenergan/compazine PRN  2. Bipolar d/o -continue seroquel  3. DM -uncontrolled -increase lantus, SSI -will need more meal coverage once PO intake improves  4. HTN -continue amlodipine  5. Chronic pain/narcotic seeking behavior -see discussions above  DVT proph: lovenox  Code Status: Full Family Communication: none at bedside Disposition Plan: home when improved   Antibiotics:  Po Vancomycin  HPI/Subjective: Continues to complain of abdominal pain and requesting narcotics, vomited x2 this am, no further diarrhea  Objective: Filed Vitals:   03/22/13 1000  BP: 176/76  Pulse: 69  Temp: 98.7 F (37.1 C)  Resp: 18    Intake/Output Summary (Last 24 hours) at 03/22/13 1313 Last data filed at 03/22/13 0600  Gross per 24 hour  Intake 1258.75 ml  Output    275 ml  Net 983.75 ml   Filed Weights   03/22/13 0229  Weight: 86.183 kg (190 lb)    Exam:   General:  AAOx3  Cardiovascular: S1S2/RRR  Respiratory: CTAB  Abdomen: soft, old scars, NT, ND, BS present, no rigidity or rebound, but moans when touched  Musculoskeletal: no edema c/c  Data Reviewed: Basic Metabolic Panel:  Recent Labs Lab 03/21/13 0556  03/22/13 0017 03/22/13 0026 03/22/13 0852  NA 136 137 142 137  K 3.5 3.7 3.8 3.3*  CL 99 99 106 101  CO2 22 21  --  25  GLUCOSE 318* 341* 350* 359*  BUN 7 9 7 9   CREATININE 0.37* 0.38* 0.40* 0.47*  CALCIUM 9.0 9.1  --  8.7   Liver Function Tests:  Recent Labs Lab 03/21/13 0556 03/22/13 0017  AST 21 21  ALT 12 11  ALKPHOS 68 67  BILITOT 0.3 0.3  PROT 8.4* 8.1  ALBUMIN 3.8 3.6    Recent Labs Lab 03/21/13 0556  LIPASE 52   No results found for this basename: AMMONIA,  in the last 168 hours CBC:  Recent Labs Lab 03/21/13 0556 03/22/13 0017 03/22/13 0026 03/22/13 0852  WBC 10.0 8.9  --  8.3  NEUTROABS 8.1* 6.5  --   --   HGB 9.3* 9.4* 11.9* 9.3*  HCT 30.7* 31.2* 35.0* 30.8*  MCV 60.1* 60.0*  --  60.0*  PLT 477* 485*  --  428*   Cardiac Enzymes: No results found for this basename: CKTOTAL, CKMB, CKMBINDEX, TROPONINI,  in the last 168 hours BNP (last 3 results) No results found for this basename: PROBNP,  in the last 8760 hours CBG:  Recent Labs Lab 03/21/13 0602 03/21/13 0736 03/22/13 0746 03/22/13 1218  GLUCAP 329* 289* 335* 272*    No results found for this or any previous visit (from the past 240 hour(s)).   Studies: No results found.  Scheduled Meds: . amLODipine  5 mg Oral q morning - 10a  . amphetamine-dextroamphetamine  30 mg Oral  BID WC  . enoxaparin (LOVENOX) injection  40 mg Subcutaneous Q24H  . famotidine  40 mg Oral BID  . insulin aspart  0-15 Units Subcutaneous TID WC  . insulin glargine  50 Units Subcutaneous QHS  . metronidazole  500 mg Intravenous Q8H  . nicotine  14 mg Transdermal Daily  . QUEtiapine  300 mg Oral BID   Continuous Infusions: . sodium chloride 75 mL/hr at 03/22/13 1310    Principal Problem:   Nausea & vomiting Active Problems:   DIABETES MELLITUS, TYPE II   TOBACCO ABUSE   GERD   Clostridium difficile colitis   HTN (hypertension)    Time spent:    [facility]  [facility] Pager [phone]. If 7PM-7AM, please contact night-coverage at [email or web address], password [facility] 03/22/2013, 1:13 PM  LOS: 1 day

## 2013-03-22 NOTE — Progress Notes (Signed)
Was told by lab that pt did not want to be stuck in hand for labs.  Pt has coat on, making it impossible to access arms.  Pt refused to take off coat.  Went in to speak to pt, pt refused to comment.  Pt stated that she does not recall making a contract to not get narcotics this admission.  Gave pt phenergan to help with nausea, pt now comfortably resting.  Will continue to monitor.    Sherron Monday

## 2013-03-22 NOTE — ED Notes (Addendum)
Pt able to ambulate to restroom without difficulty. Greta Doom, PA made aware of pt's pain.

## 2013-03-22 NOTE — H&P (Signed)
[facility] History and Physical  [name] WGN:[id number] DOB: 07-22-1975 DOA: 03/21/2013  Referring physician: Dr. Dierdre Highman PCP: Jeri Cos, MD   Chief Complaint: Nausea, vomiting, abdominal pain, diarrhea  HPI: [name] is a 37 y.o. female past medical history significant for diabetes (type II on insulin), chronic microcytic anemia, bipolar disorder, chronic pain syndrome (with manipulative and drug pain seeking behavior) and recent admission to the hospital secondary to mild pancreatitis and C. difficile colitis; return to the hospital secondary to intractable nausea, vomiting, abdominal pain, ongoing diarrhea and difficulty keeping medications. Patient reports that approximately 5 days after discharge her symptoms returned and worsened on daily basis ALT of the date of admission. Patient reports that she has not been able for the last 3 days to keep any food, medication, or liquid down.  She denies chest pain, shortness of breath, fever/chills, cough, melena, hematemesis, hematochezia, this oriented or any other acute complaints.   NAD patient initial blood work was unremarkable, but the patient continue complaining of abdominal pain, nausea/vomiting and diarrhea. [facility] has been consulted to admit the patient for further evaluation and treatment.   patient was confronted regarding her chronic pain syndrome and pain seeking behavior, plus was made aware of side effects of narcotics most likely potentiating her ongoing symptoms; patient was in agreement to be admitted for observation to guarantee that she can tolerate PO's and was made aware that no narcotics will be given during this admission.  Review of Systems:  Positive for intractable nausea, vomiting, diarrhea, abdominal pain and difficulty keeping things down. Otherwise negative except as mentioned on history of present illness.  Past Medical History  Diagnosis Date  . Diabetes mellitus   . Anemia   .  Headache(784.0)   . Seasonal allergies   . Abnormal Pap smear   . Gout   . Depression   . Anxiety   . UTI (urinary tract infection)   . Bipolar 1 disorder    Past Surgical History  Procedure Laterality Date  . Cholecystectomy    . Cesarean section    . Esophagogastroduodenoscopy N/A 09/07/2012    Procedure: ESOPHAGOGASTRODUODENOSCOPY (EGD);  Surgeon: Charna Elizabeth, MD;  Location: [facility] ENDOSCOPY;  Service: Endoscopy;  Laterality: N/A;   Social History:  reports that she has been smoking Cigarettes.  She has been smoking about 0.50 packs per day. She has never used smokeless tobacco. She reports that she does not drink alcohol or use illicit drugs.   Allergies  Allergen Reactions  . Reglan [Metoclopramide] Shortness Of Breath  . Zofran [Ondansetron Hcl] Hives and Nausea And Vomiting    Family History  Problem Relation Age of Onset  . Hypertension Mother   . Diabetes Mother   . Hypertension Maternal Aunt   . Diabetes Maternal Aunt   . Hypertension Maternal Uncle   . Diabetes Maternal Uncle   . Hypertension Maternal Grandmother    Prior to Admission medications   Medication Sig Start Date End Date Taking? Authorizing Provider  alprazolam Prudy Feeler) 2 MG tablet Take 2 mg by mouth 3 (three) times daily as needed for sleep or anxiety.   Yes Historical Provider, MD  amLODipine (NORVASC) 5 MG tablet Take 5 mg by mouth every morning.   Yes Historical Provider, MD  amphetamine-dextroamphetamine (ADDERALL) 30 MG tablet Take 30 mg by mouth 2 (two) times daily. Take 30 mg by mouth 2 times daily.   Yes Historical Provider, MD  famotidine (PEPCID) 20 MG tablet Take 1 tablet (20  mg total) by mouth daily. 03/12/13  Yes Alison Murray, MD  fluconazole (DIFLUCAN) 100 MG tablet Take 1 tablet (100 mg total) by mouth daily. 03/12/13  Yes Alison Murray, MD  HYDROmorphone (DILAUDID) 2 MG tablet Take 1 tablet (2 mg total) by mouth every 4 (four) hours as needed for pain. 03/12/13  Yes Alison Murray, MD  insulin  aspart (NOVOLOG) 100 UNIT/ML injection Inject 10-20 Units into the skin 3 (three) times daily before meals. Sliding scale   Yes Historical Provider, MD  insulin glargine (LANTUS) 100 UNIT/ML injection Inject 0.7 mLs (70 Units total) into the skin at bedtime. 03/12/13  Yes Alison Murray, MD  metroNIDAZOLE (FLAGYL) 500 MG tablet Take 1 tablet (500 mg total) by mouth every 8 (eight) hours. 03/12/13  Yes Alison Murray, MD  mupirocin ointment (BACTROBAN) 2 % Apply topically 2 (two) times daily. On thigh boil; use for 2 weeks 03/12/13  Yes Alison Murray, MD  oxyCODONE-acetaminophen (PERCOCET) 10-325 MG per tablet Take 1 tablet by mouth every 6 (six) hours as needed for pain. 03/12/13  Yes Alison Murray, MD  QUEtiapine (SEROQUEL) 300 MG tablet Take 1 tablet (300 mg total) by mouth 2 (two) times daily. Take 300 mg by mouth 2 times daily. 03/12/13  Yes Alison Murray, MD  saccharomyces boulardii (FLORASTOR) 250 MG capsule Take 1 capsule (250 mg total) by mouth 2 (two) times daily. 03/12/13  Yes Alison Murray, MD  sitaGLIPtin (JANUVIA) 50 MG tablet Take 1 tablet (50 mg total) by mouth 2 (two) times daily. Take 50 mg by mouth 2 times daily. 03/12/13  Yes Alison Murray, MD  sulfamethoxazole-trimethoprim (BACTRIM DS) 800-160 MG per tablet Take 2 tablets by mouth 2 (two) times daily. Take 2 tablets by mouth 2 times daily for 28 days. 03/12/13 04/09/13 Yes Alison Murray, MD  zolpidem (AMBIEN) 5 MG tablet Take 1 tablet (5 mg total) by mouth at bedtime as needed for sleep (insomnia). 03/12/13  Yes Alison Murray, MD   Physical Exam: Filed Vitals:   03/21/13 2150  BP: 180/64  Pulse: 84  Temp: 98.7 F (37.1 C)  Resp: 18     General:  Lying on bed, no acute distress, afebrile, cooperative to examination. Patient asking for pain medications.  Eyes: No icterus, no nystagmus, PERRLA, extraocular muscles intact  ENT: Good dentition, no erythema or exudate inside her mouth, moist mucous membranes, no drainage out of her years  ordinals chills.  Neck: supple, no JVD or bruits.  Cardiovascular: S1-S2. No rubs, no gallops, no murmurs; RRR  Respiratory: clear to auscultation bilaterally  Abdomen: soft, no guarding, positive bowel sounds, tender to palpation mid abdomen and left lower quadrant  Skin: No rash or petechiae. Patient with left tight mild induration, without any erythema or drainage.  Musculoskeletal: No joint swelling or erythema, full range of motion.  Psychiatric: no hallucinations, no suicidal ideation, flat affect.  Neurologic: alert, awake and oriented x3, cranial nerves grossly intact, no focal motor or sensory deficit on exam.  Labs on Admission:  Basic Metabolic Panel:  Recent Labs Lab 03/21/13 0556 03/22/13 0017 03/22/13 0026  NA 136 137 142  K 3.5 3.7 3.8  CL 99 99 106  CO2 22 21  --   GLUCOSE 318* 341* 350*  BUN 7 9 7   CREATININE 0.37* 0.38* 0.40*  CALCIUM 9.0 9.1  --    Liver Function Tests:  Recent Labs Lab 03/21/13 0556 03/22/13 0017  AST 21 21  ALT 12 11  ALKPHOS 68 67  BILITOT 0.3 0.3  PROT 8.4* 8.1  ALBUMIN 3.8 3.6    Recent Labs Lab 03/21/13 0556  LIPASE 52   CBC:  Recent Labs Lab 03/21/13 0556 03/22/13 0017 03/22/13 0026  WBC 10.0 8.9  --   NEUTROABS 8.1* 6.5  --   HGB 9.3* 9.4* 11.9*  HCT 30.7* 31.2* 35.0*  MCV 60.1* 60.0*  --   PLT 477* 485*  --    CBG:  Recent Labs Lab 03/21/13 0602 03/21/13 0736  GLUCAP 329* 289*    Assessment/Plan 1-Nausea, vomiting and diarrhea: Most likely secondary to ongoing C. difficile infection. Also medications including Flagyl, narcotics and florastor can cause dyspepsia and  That leading to nausea and vomiting as well. -At this moment will use as needed Compazine and Phenergan -IV fluid resuscitation -Clear liquid diet and advance slowly as tolerated to modify carbohydrates -Will check abdominal CT is (patient complaining of left lower quadrant pain) -Will switch Flagyl to vancomycin which has been  to be better tolerated for treatment of C. Difficile. -Will follow clinical response. -Repeat electrolytes as needed.  2-DIABETES MELLITUS, TYPE II: Last A1c 7.6 -Will use Lantus and a sliding scale insulin. -Lantus dose has been drink use from home dose secondary to poor intake.  3-TOBACCO ABUSE: Counseling has been provided. Nicotine patch will be used to admission.  4-GERD: Will continue Pepcid. Dose will be adjusted to twice a day for better control of her symptoms.  5-Clostridium difficile colitis: With continue abdominal pain and diarrhea despite almost 9 days of treatment with Flagyl. -Will treat as a failure to response to Flagyl and will switch to oral vancomycin 125 mg 4 times a day.  6-HTN (hypertension): A stable and well controlled. Will continue amlodipine.  7-bipolar disorder: Mode stable. Continue home regimen  8-insomnia: Continue when necessary zolpidem.  9-chronic pain syndrome: At this moment narcotics will be discontinue as they can also be producing the nausea and vomiting that she is experiencing. Patient also with pain seeking behavior that will not be encourage by the use of narcotics; this has been discussed with the patient and she is in agreement of narcotics to the use but in this admission. -When necessary Tylenol and Toradol for pain with the use.  10-chronic microcytic anemia: Stable with hemoglobin in the 9-11 range. No transfusion required. Will monitor hemoglobin levels.  DVT: Lovenox.   Code Status: Full Family Communication: daughter and son at bedside Disposition Plan: observation, med-surg bed; LOs < 2 midnights  Time spent: 50 minutes  Belinda Schlichting [facility] Pager [phone]  If 7PM-7AM, please contact night-coverage [email or web address] Password [facility] 03/22/2013, 1:38 AM

## 2013-03-22 NOTE — Progress Notes (Signed)
Paged hospitalist on call.  Pt stated that the Toradol previously given did not help when she woke back up around 0515.  Was told that according to notes that Gwenlyn Perking MD made, she agreed to a contract upon admission to not have narcotics.  To keep pain medicine limited to Toradol and Tylenol.  Pt has fallen back asleep.  Will inform her when she wakes up.  Hospitalist said they would put in a note to [facility] MD about pain control issues.  Will continue to monitor.  Sherron Monday

## 2013-03-22 NOTE — Progress Notes (Signed)
CBG=406 Dr. Jomarie Longs notified and new order received. Medicated with Novolog Insulin 15 units sq. Continue to assess and monitor.

## 2013-03-22 NOTE — ED Notes (Signed)
Called lab to check to see if they have a green top to run a CMP.  [name] from lab said they have a green top and will print the label to run CMP.

## 2013-03-23 DIAGNOSIS — F411 Generalized anxiety disorder: Secondary | ICD-10-CM

## 2013-03-23 LAB — GLUCOSE, CAPILLARY
Glucose-Capillary: 330 mg/dL — ABNORMAL HIGH (ref 70–99)
Glucose-Capillary: 190 mg/dL — ABNORMAL HIGH (ref 70–99)

## 2013-03-23 MED ORDER — AMLODIPINE BESYLATE 5 MG PO TABS
5.0000 mg | ORAL_TABLET | Freq: Once | ORAL | Status: AC
  Administered 2013-03-23: 5 mg via ORAL
  Filled 2013-03-23: qty 1

## 2013-03-23 MED ORDER — AMLODIPINE BESYLATE 10 MG PO TABS
10.0000 mg | ORAL_TABLET | Freq: Every morning | ORAL | Status: DC
  Administered 2013-03-24: 10 mg via ORAL
  Filled 2013-03-23: qty 1

## 2013-03-23 MED ORDER — TRAMADOL HCL 50 MG PO TABS
50.0000 mg | ORAL_TABLET | Freq: Three times a day (TID) | ORAL | Status: DC | PRN
  Filled 2013-03-23: qty 1

## 2013-03-23 MED ORDER — INSULIN GLARGINE 100 UNIT/ML ~~LOC~~ SOLN
65.0000 [IU] | Freq: Every day | SUBCUTANEOUS | Status: DC
  Administered 2013-03-23: 65 [IU] via SUBCUTANEOUS
  Filled 2013-03-23 (×2): qty 0.65

## 2013-03-23 MED ORDER — OXYCODONE-ACETAMINOPHEN 5-325 MG PO TABS
1.0000 | ORAL_TABLET | Freq: Four times a day (QID) | ORAL | Status: DC | PRN
  Administered 2013-03-23 – 2013-03-24 (×4): 2 via ORAL
  Filled 2013-03-23 (×4): qty 2

## 2013-03-23 MED ORDER — INSULIN ASPART 100 UNIT/ML ~~LOC~~ SOLN
5.0000 [IU] | Freq: Three times a day (TID) | SUBCUTANEOUS | Status: DC
  Administered 2013-03-23 – 2013-03-24 (×3): 5 [IU] via SUBCUTANEOUS

## 2013-03-23 MED ORDER — KETOROLAC TROMETHAMINE 15 MG/ML IJ SOLN
15.0000 mg | Freq: Four times a day (QID) | INTRAMUSCULAR | Status: DC | PRN
Start: 2013-03-23 — End: 2013-03-24

## 2013-03-23 NOTE — ED Provider Notes (Signed)
Medical screening examination/treatment/procedure(s) were performed by non-physician practitioner and as supervising physician I was immediately available for consultation/collaboration.  Sunnie Nielsen, MD 03/23/13 [phone]

## 2013-03-23 NOTE — Progress Notes (Signed)
[facility] PROGRESS NOTE  [name] ZOX:[id number] DOB: [birth year] DOA: 03/21/2013 PCP: Jeri Cos, MD  Assessment/Plan:  1. N/V/Abd pain and Diarrhea -no further documented evidence of diarrhea -1-2 episodes of vomiting yesterday am, one episode of retching spitting up small amt of ensure per Night RN -Nurses to document each episode or vomiting or Diarrhea -Given chronic narcotics dependence and concern that component of opiate withdrawal contributing and hence resume PO percocet Q6PRN today -She takes Percocet and dilaudid everyday at home.  Vs recurrent Cdiff, pt reports multiple episodes of diarrhea, 1 reported per Night RN -continue IV flagyl, will need to treat another full course, pt admits not taking any flagyl after discharge from last admission -I discussed our reasons to suspect opiate addiction with pt. -narcotic seeking behavior noted on multiple prior admissions -full liquids, advance as tolerated -IVF, phenergan/compazine PRN  2. Bipolar d/o -continue seroquel  3. DM -uncontrolled -increase lantus, add novolog with meals, SSI -will need more meal coverage once PO intake improves  4. HTN -continue amlodipine, increase dose  5. Chronic pain/narcotic seeking behavior -see discussions above  DVT proph: lovenox  Code Status: Full Family Communication: none at bedside Disposition Plan: home when improved   Antibiotics:  Po Vancomycin  HPI/Subjective: Continues to complain of abdominal pain and requesting narcotics, spit up small amt of ensure last night, 2 this am, no further diarrhea  Objective: Filed Vitals:   03/23/13 0940  BP: 180/75  Pulse:   Temp:   Resp:     Intake/Output Summary (Last 24 hours) at 03/23/13 1009 Last data filed at 03/23/13 0600  Gross per 24 hour  Intake   1900 ml  Output      0 ml  Net   1900 ml   Filed Weights   03/22/13 0229  Weight: 86.183 kg (190 lb)    Exam:   General:   AAOx3  Cardiovascular: S1S2/RRR  Respiratory: CTAB  Abdomen: soft, old scars, NT, ND, BS present, no rigidity or rebound, but moans when touched  Musculoskeletal: no edema c/c  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  Data Reviewed: Basic Metabolic Panel:  Recent Labs Lab 03/21/13 0556 03/22/13 0017 03/22/13 0026 03/22/13 0852  NA 136 137 142 137  K 3.5 3.7 3.8 3.3*  CL 99 99 106 101  CO2 22 21  --  25  GLUCOSE 318* 341* 350* 359*  BUN 7 9 7 9   CREATININE 0.37* 0.38* 0.40* 0.47*  CALCIUM 9.0 9.1  --  8.7   Liver Function Tests:  Recent Labs Lab 03/21/13 0556 03/22/13 0017  AST 21 21  ALT 12 11  ALKPHOS 68 67  BILITOT 0.3 0.3  PROT 8.4* 8.1  ALBUMIN 3.8 3.6    Recent Labs Lab 03/21/13 0556  LIPASE 52   No results found for this basename: AMMONIA,  in the last 168 hours CBC:  Recent Labs Lab 03/21/13 0556 03/22/13 0017 03/22/13 0026 03/22/13 0852  WBC 10.0 8.9  --  8.3  NEUTROABS 8.1* 6.5  --   --   HGB 9.3* 9.4* 11.9* 9.3*  HCT 30.7* 31.2* 35.0* 30.8*  MCV 60.1* 60.0*  --  60.0*  PLT 477* 485*  --  428*   Cardiac Enzymes: No results found for this basename: CKTOTAL, CKMB, CKMBINDEX, TROPONINI,  in the last 168 hours BNP (last 3 results) No results found for this basename: PROBNP,  in the last 8760 hours CBG:  Recent Labs Lab 03/22/13 0746 03/22/13 1218 03/22/13  1711 04-07-13 2131 03/23/13 0730  GLUCAP 335* 272* 406* 298* 330*    No results found for this or any previous visit (from the past 240 hour(s)).   Studies: Acute Abdominal Series  04-07-2013   CLINICAL DATA:  Abdominal pain. Nausea and vomiting and diarrhea.  EXAM: ACUTE ABDOMEN SERIES (ABDOMEN 2 VIEW & CHEST 1 VIEW)  COMPARISON:  CT, 03/08/2013.  FINDINGS: There is no evidence of dilated bowel loops or free intraperitoneal air. No radiopaque calculi or other significant radiographic abnormality is seen. There has been a prior cholecystectomy. Heart size and mediastinal contours are  within normal limits. Both lungs are clear.  IMPRESSION: Negative abdominal radiographs.  No acute cardiopulmonary disease.   Electronically Signed   By: Amie Portland   On: 2013/04/07 14:06    Scheduled Meds: . amLODipine  5 mg Oral q morning - 10a  . amphetamine-dextroamphetamine  30 mg Oral BID WC  . enoxaparin (LOVENOX) injection  40 mg Subcutaneous Q24H  . famotidine  40 mg Oral BID  . insulin aspart  0-15 Units Subcutaneous TID WC  . insulin aspart  5 Units Subcutaneous TID WC  . insulin glargine  65 Units Subcutaneous QHS  . metronidazole  500 mg Intravenous Q8H  . nicotine  14 mg Transdermal Daily  . QUEtiapine  300 mg Oral BID   Continuous Infusions: . sodium chloride 75 mL/hr at 03/23/13 0442    Principal Problem:   Nausea & vomiting Active Problems:   DIABETES MELLITUS, TYPE II   TOBACCO ABUSE   GERD   Clostridium difficile colitis   HTN (hypertension)    Time spent:    [facility]  [facility] Pager [phone]. If 7PM-7AM, please contact night-coverage at [email or web address], password [facility] 03/23/2013, 10:09 AM  LOS: 2 days

## 2013-03-23 NOTE — Progress Notes (Signed)
Patient refusing Lovenox. Advised patient of the importance. Advised to ambulate. Md on floor and made her aware.

## 2013-03-24 LAB — BASIC METABOLIC PANEL
BUN: 11 mg/dL (ref 6–23)
CO2: 21 mEq/L (ref 19–32)
Calcium: 9.3 mg/dL (ref 8.4–10.5)
Chloride: 100 mEq/L (ref 96–112)
Creatinine, Ser: 0.51 mg/dL (ref 0.50–1.10)
Glucose, Bld: 287 mg/dL — ABNORMAL HIGH (ref 70–99)

## 2013-03-24 LAB — CBC
MCH: 18.1 pg — ABNORMAL LOW (ref 26.0–34.0)
MCV: 60.2 fL — ABNORMAL LOW (ref 78.0–100.0)
Platelets: 377 10*3/uL (ref 150–400)
RDW: 20.8 % — ABNORMAL HIGH (ref 11.5–15.5)
WBC: 8.8 10*3/uL (ref 4.0–10.5)

## 2013-03-24 MED ORDER — METRONIDAZOLE 500 MG PO TABS: 500.0000 mg | ORAL_TABLET | Freq: Three times a day (TID) | ORAL | Status: DC

## 2013-03-24 NOTE — Progress Notes (Signed)
Patient refused Lovenox MD made aware.

## 2013-03-24 NOTE — Discharge Summary (Addendum)
Physician Discharge Summary  [name] ZOX:[id number] DOB: 08-05-1975 DOA: 03/21/2013  PCP: None, Unassigned  Admit date: 03/21/2013 Discharge date: 03/24/2013  Time spent: 50 minutes  Recommendations for Outpatient Follow-up:  -Please Use objective signs to measure illness instead of subjective symptoms -Please Avoid IV narcotics as much as possible -Wean off Oral narcotics if possible -PCP: Pt attempting to Fu with Dr.Jenkins  Discharge Diagnoses:    Chronic narcotic dependence   Narcotic seeking behavior documented on multiple occasions   Cdiff colitis   Chronic abdominal pain/nausea and vomiting   DIABETES MELLITUS, TYPE II   TOBACCO ABUSE   GERD   Clostridium difficile colitis   HTN (hypertension)   Discharge Condition: stable  Diet recommendation: bland diabetic diet  Filed Weights   03/22/13 0229  Weight: 86.183 kg (190 lb)    History of present illness:  [name] is a 37 y.o. female past medical history significant for diabetes (type II on insulin), chronic microcytic anemia, bipolar disorder, chronic pain syndrome (with manipulative and drug pain seeking behavior) and recent admission to the hospital secondary to mild pancreatitis and C. difficile colitis; return to the hospital secondary to intractable nausea, vomiting, abdominal pain, ongoing diarrhea and difficulty keeping medications. Patient reports that approximately 5 days after discharge her symptoms returned and worsened on daily basis ALT of the date of admission. Patient reports that she has not been able for the last 3 days to keep any food, medication, or liquid down.  She denies chest pain, shortness of breath, fever/chills, cough, melena, hematemesis, hematochezia, this oriented or any other acute complaints.  NAD patient initial blood work was unremarkable, but the patient continue complaining of abdominal pain, nausea/vomiting and diarrhea. [facility] has been consulted to admit the  patient for further evaluation and treatment.  patient was confronted regarding her chronic pain syndrome and pain seeking behavior, plus was made aware of side effects of narcotics most likely potentiating her ongoing symptoms; patient was in agreement to be admitted for observation to guarantee that she can tolerate PO's and was made aware that no narcotics will be given during this admission.   Hospital Course:  1. N/V/Abd pain and Diarrhea  -no further documented evidence of diarrhea  -1-2 episodes of vomiting 9/28am, none noted since and documented per RN and staff since. -Unfortunately pt has on multiple occasions been dishonest about her symptoms. (Namely told MD that she was vomiting and having diarrhea 6-10 times through the night when the nurses caring for her overnight were only aware of 1 bowel movement). -Given chronic narcotics dependence and concern that component of opiate withdrawal contributing and hence resume PO percocet, advised her to stop PO Dilaudid that she takes at home and has been told multiple times that chronic narcotics use is likely contributing to her nausea and vomiting, she will need to be gradually weaned off narcotics.  - Recent Cdiff, pt reports multiple episodes of diarrhea, 1 reported per Night RN on 9/28, pt admitted not taking any flagyl after discharge from last admission for c diff colitis. She was started back on IV flagyl in the hospital and then PO. -She was stable and tolerating a diet prior to discharge and advised to complete another 10day course of PO flagyl since previous course was interrupted. Prior workup includes -EGD 3/14 normal except for small hiatal hernia, multiple recent CT scans unrevealing, Gastric emptying scan 2006: normal -I did not give her any narcotics at discharge, she admitted having percocet at  home.  2. Bipolar d/o  -continue seroquel   3. DM  -uncontrolled  -increased lantus to home dose, added novolog with meals, SSI    4. HTN  -continue amlodipine, increase dose   5. Chronic pain/narcotic seeking behavior  -see discussions above      Discharge Exam: Filed Vitals:   03/24/13 0720  BP: 119/76  Pulse: 86  Temp: 98.5 F (36.9 C)  Resp: 18    General: AAOx3, no distress Cardiovascular: S1S2/RRR Respiratory: CTAB  Discharge Instructions  Discharge Orders   Future Orders Complete By Expires   Discharge instructions  As directed    Comments:     Soft, Bland/diabetic diet , advance as tolerated   Increase activity slowly  As directed        Medication List    STOP taking these medications       HYDROmorphone 2 MG tablet  Commonly known as:  DILAUDID      TAKE these medications       alprazolam 2 MG tablet  Commonly known as:  XANAX  Take 2 mg by mouth 3 (three) times daily as needed for sleep or anxiety.     amLODipine 5 MG tablet  Commonly known as:  NORVASC  Take 5 mg by mouth every morning.     amphetamine-dextroamphetamine 30 MG tablet  Commonly known as:  ADDERALL  Take 30 mg by mouth 2 (two) times daily. Take 30 mg by mouth 2 times daily.     famotidine 20 MG tablet  Commonly known as:  PEPCID  Take 1 tablet (20 mg total) by mouth daily.     fluconazole 100 MG tablet  Commonly known as:  DIFLUCAN  Take 1 tablet (100 mg total) by mouth daily.     insulin aspart 100 UNIT/ML injection  Commonly known as:  novoLOG  Inject 10-20 Units into the skin 3 (three) times daily before meals. Sliding scale     insulin glargine 100 UNIT/ML injection  Commonly known as:  LANTUS  Inject 0.7 mLs (70 Units total) into the skin at bedtime.     metroNIDAZOLE 500 MG tablet  Commonly known as:  FLAGYL  Take 1 tablet (500 mg total) by mouth every 8 (eight) hours. For 10days     mupirocin ointment 2 %  Commonly known as:  BACTROBAN  Apply topically 2 (two) times daily. On thigh boil; use for 2 weeks     oxyCODONE-acetaminophen 10-325 MG per tablet  Commonly known as:   PERCOCET  Take 1 tablet by mouth every 6 (six) hours as needed for pain.     QUEtiapine 300 MG tablet  Commonly known as:  SEROQUEL  Take 1 tablet (300 mg total) by mouth 2 (two) times daily. Take 300 mg by mouth 2 times daily.     saccharomyces boulardii 250 MG capsule  Commonly known as:  FLORASTOR  Take 1 capsule (250 mg total) by mouth 2 (two) times daily.     sitaGLIPtin 50 MG tablet  Commonly known as:  JANUVIA  Take 1 tablet (50 mg total) by mouth 2 (two) times daily. Take 50 mg by mouth 2 times daily.     sulfamethoxazole-trimethoprim 800-160 MG per tablet  Commonly known as:  BACTRIM DS  Take 2 tablets by mouth 2 (two) times daily. Take 2 tablets by mouth 2 times daily for 28 days.     zolpidem 5 MG tablet  Commonly known as:  AMBIEN  Take 1 tablet (5  mg total) by mouth at bedtime as needed for sleep (insomnia).       Allergies  Allergen Reactions  . Reglan [Metoclopramide] Shortness Of Breath  . Zofran [Ondansetron Hcl] Hives and Nausea And Vomiting       Follow-up Information   Follow up with Ron Parker, MD. Schedule an appointment as soon as possible for a visit in 1 week.   Specialty:  Internal Medicine   Contact information:   [address] [place] [address] [phone]        The results of significant diagnostics from this hospitalization (including imaging, microbiology, ancillary and laboratory) are listed below for reference.    Significant Diagnostic Studies: Ct Abdomen Pelvis W Contrast  03/08/2013   CLINICAL DATA:  Abdominal pain, rising lipase, history of pancreatitis  EXAM: CT ABDOMEN AND PELVIS WITH CONTRAST  TECHNIQUE: Multidetector CT imaging of the abdomen and pelvis was performed using the standard protocol following bolus administration of intravenous contrast.  CONTRAST:  OMNIPAQUE IOHEXOL 300 MG/ML  SOLN  COMPARISON:  09/02/2012 and 12/01/2012  FINDINGS: Lung bases are unremarkable. Sagittal images of  the spine are unremarkable.  Heart size is within normal limits. Enhanced liver shows no biliary ductal dilatation. The patient is status postcholecystectomy. The spleen and adrenal glands are unremarkable. There is normal enhancement of the pancreas. There is minimal stranding of peripancreatic fat in distal body and tail of the pancreas as seen in axial image 30 and sagittal image 65. Very mild pancreatitis cannot be excluded. Clinical correlation is necessary. No peripancreatic fluid. No pancreatic pseudocysts.  Kidneys are symmetrical in size and enhancement.  Again noted nonobstructive calcified calculus mid pole of the right kidney measures 6.4 mm.  Nonobstructive calcified calculus in lower pole of the right kidney measures 2 mm the  No calcified ureteral calculi are noted. No small bowel obstruction. No ascites or free air. No adenopathy. There is no pericecal inflammation. Normal appendix is clearly visualize in axial image 60.  Retroflexed uterus is noted. No adnexal mass. Trace free fluid is noted in posterior of call the site. The urinary bladder is empty limiting its assessment. No inguinal adenopathy. No destructive bony lesions are noted within pelvis.  Small nonspecific bilateral inguinal lymph nodes.  IMPRESSION: 1. There is normal enhancement of the pancreas. There is minimal stranding of the peripancreatic fat especially in the body and tail of the pancreas. Minimal pancreatitis cannot be excluded. Clinical correlation is necessary. 2. Status postcholecystectomy. 3. No pancreatic pseudocyst or peripancreatic fluid is noted. 4. Normal appendix. No pericecal inflammation. 5. Retroflexed uterus. Trace pelvic free fluid.   Electronically Signed   By: Natasha Mead   On: 03/08/2013 12:31   Acute Abdominal Series  03/22/2013   CLINICAL DATA:  Abdominal pain. Nausea and vomiting and diarrhea.  EXAM: ACUTE ABDOMEN SERIES (ABDOMEN 2 VIEW & CHEST 1 VIEW)  COMPARISON:  CT, 03/08/2013.  FINDINGS: There is no  evidence of dilated bowel loops or free intraperitoneal air. No radiopaque calculi or other significant radiographic abnormality is seen. There has been a prior cholecystectomy. Heart size and mediastinal contours are within normal limits. Both lungs are clear.  IMPRESSION: Negative abdominal radiographs.  No acute cardiopulmonary disease.   Electronically Signed   By: Amie Portland   On: 03/22/2013 14:06    Microbiology: No results found for this or any previous visit (from the past 240 hour(s)).   Labs: Basic Metabolic Panel:  Recent Labs Lab 03/21/13 [phone]  03/22/13 0017 03/22/13 0026 03/22/13 0852 03/24/13 0830  NA 136 137 142 137 134*  K 3.5 3.7 3.8 3.3* 3.2*  CL 99 99 106 101 100  CO2 22 21  --  25 21  GLUCOSE 318* 341* 350* 359* 287*  BUN 7 9 7 9 11   CREATININE 0.37* 0.38* 0.40* 0.47* 0.51  CALCIUM 9.0 9.1  --  8.7 9.3   Liver Function Tests:  Recent Labs Lab 03/21/13 0556 03/22/13 0017  AST 21 21  ALT 12 11  ALKPHOS 68 67  BILITOT 0.3 0.3  PROT 8.4* 8.1  ALBUMIN 3.8 3.6    Recent Labs Lab 03/21/13 0556  LIPASE 52   No results found for this basename: AMMONIA,  in the last 168 hours CBC:  Recent Labs Lab 03/21/13 0556 03/22/13 0017 03/22/13 0026 03/22/13 0852 03/24/13 0830  WBC 10.0 8.9  --  8.3 8.8  NEUTROABS 8.1* 6.5  --   --   --   HGB 9.3* 9.4* 11.9* 9.3* 8.8*  HCT 30.7* 31.2* 35.0* 30.8* 29.2*  MCV 60.1* 60.0*  --  60.0* 60.2*  PLT 477* 485*  --  428* 377   Cardiac Enzymes: No results found for this basename: CKTOTAL, CKMB, CKMBINDEX, TROPONINI,  in the last 168 hours BNP: BNP (last 3 results) No results found for this basename: PROBNP,  in the last 8760 hours CBG:  Recent Labs Lab 03/23/13 0730 03/23/13 1150 03/23/13 1547 03/23/13 2245 03/24/13 0736  GLUCAP 330* 190* 200* 265* 237*       Signed:  Bryce Cheever  [facility] 03/24/2013, 1:48 PM

## 2013-04-04 ENCOUNTER — Encounter (HOSPITAL_COMMUNITY): Payer: Self-pay | Admitting: Emergency Medicine

## 2013-04-04 ENCOUNTER — Emergency Department (HOSPITAL_COMMUNITY)
Admission: EM | Admit: 2013-04-04 | Discharge: 2013-04-05 | Disposition: A | Discharge status: Home / Self Care | Payer: PRIVATE HEALTH INSURANCE | Attending: Emergency Medicine | Admitting: Emergency Medicine

## 2013-04-04 DIAGNOSIS — Z794 Long term (current) use of insulin: Secondary | ICD-10-CM | POA: Diagnosis not present

## 2013-04-04 DIAGNOSIS — E119 Type 2 diabetes mellitus without complications: Secondary | ICD-10-CM | POA: Insufficient documentation

## 2013-04-04 DIAGNOSIS — Z87440 Personal history of urinary (tract) infections: Secondary | ICD-10-CM | POA: Diagnosis not present

## 2013-04-04 DIAGNOSIS — Z79899 Other long term (current) drug therapy: Secondary | ICD-10-CM | POA: Diagnosis not present

## 2013-04-04 DIAGNOSIS — R7309 Other abnormal glucose: Secondary | ICD-10-CM | POA: Insufficient documentation

## 2013-04-04 DIAGNOSIS — R112 Nausea with vomiting, unspecified: Secondary | ICD-10-CM | POA: Diagnosis not present

## 2013-04-04 DIAGNOSIS — F319 Bipolar disorder, unspecified: Secondary | ICD-10-CM | POA: Insufficient documentation

## 2013-04-04 DIAGNOSIS — Z8719 Personal history of other diseases of the digestive system: Secondary | ICD-10-CM | POA: Diagnosis not present

## 2013-04-04 DIAGNOSIS — Z862 Personal history of diseases of the blood and blood-forming organs and certain disorders involving the immune mechanism: Secondary | ICD-10-CM | POA: Diagnosis not present

## 2013-04-04 DIAGNOSIS — R1013 Epigastric pain: Secondary | ICD-10-CM | POA: Diagnosis not present

## 2013-04-04 DIAGNOSIS — F411 Generalized anxiety disorder: Secondary | ICD-10-CM | POA: Insufficient documentation

## 2013-04-04 DIAGNOSIS — R1032 Left lower quadrant pain: Secondary | ICD-10-CM | POA: Diagnosis not present

## 2013-04-04 DIAGNOSIS — R739 Hyperglycemia, unspecified: Secondary | ICD-10-CM

## 2013-04-04 DIAGNOSIS — Z87442 Personal history of urinary calculi: Secondary | ICD-10-CM | POA: Insufficient documentation

## 2013-04-04 DIAGNOSIS — F17200 Nicotine dependence, unspecified, uncomplicated: Secondary | ICD-10-CM | POA: Diagnosis not present

## 2013-04-04 LAB — CBC
HCT: 30.5 % — ABNORMAL LOW (ref 36.0–46.0)
Hemoglobin: 9.2 g/dL — ABNORMAL LOW (ref 12.0–15.0)
MCH: 18.4 pg — ABNORMAL LOW (ref 26.0–34.0)
RBC: 4.99 MIL/uL (ref 3.87–5.11)
WBC: 9.6 10*3/uL (ref 4.0–10.5)

## 2013-04-04 LAB — COMPREHENSIVE METABOLIC PANEL
ALT: 9 U/L (ref 0–35)
AST: 11 U/L (ref 0–37)
Alkaline Phosphatase: 85 U/L (ref 39–117)
BUN: 5 mg/dL — ABNORMAL LOW (ref 6–23)
Calcium: 9 mg/dL (ref 8.4–10.5)
Chloride: 94 mEq/L — ABNORMAL LOW (ref 96–112)
GFR calc Af Amer: >90 mL/min (ref >90)
Glucose, Bld: 570 mg/dL (ref 70–99)
Potassium: 4 mEq/L (ref 3.5–5.1)
Sodium: 128 mEq/L — ABNORMAL LOW (ref 135–145)
Total Bilirubin: 0.2 mg/dL — ABNORMAL LOW (ref 0.3–1.2)
Total Protein: 8 g/dL (ref 6.0–8.3)

## 2013-04-04 LAB — GLUCOSE, CAPILLARY
Glucose-Capillary: 565 mg/dL (ref 70–99)
Glucose-Capillary: 396 mg/dL — ABNORMAL HIGH (ref 70–99)

## 2013-04-04 LAB — URINALYSIS, ROUTINE W REFLEX MICROSCOPIC
Bilirubin Urine: NEGATIVE
Glucose, UA: >1000 mg/dL — AB
Ketones, ur: NEGATIVE mg/dL
Leukocytes, UA: NEGATIVE
Nitrite: NEGATIVE
Protein, ur: NEGATIVE mg/dL
Specific Gravity, Urine: 1.039 — ABNORMAL HIGH (ref 1.005–1.030)
pH: 6 (ref 5.0–8.0)

## 2013-04-04 LAB — LIPASE, BLOOD: Lipase: 41 U/L (ref 11–59)

## 2013-04-04 LAB — URINE MICROSCOPIC-ADD ON

## 2013-04-04 MED ORDER — SODIUM CHLORIDE 0.9 % IV BOLUS (SEPSIS)
1000.0000 mL | Freq: Once | INTRAVENOUS | Status: AC
  Administered 2013-04-04: 1000 mL via INTRAVENOUS

## 2013-04-04 MED ORDER — PROMETHAZINE HCL 25 MG/ML IJ SOLN
25.0000 mg | Freq: Once | INTRAMUSCULAR | Status: AC
  Administered 2013-04-04: 25 mg via INTRAMUSCULAR
  Filled 2013-04-04: qty 1

## 2013-04-04 MED ORDER — INSULIN ASPART 100 UNIT/ML ~~LOC~~ SOLN
10.0000 [IU] | Freq: Once | SUBCUTANEOUS | Status: AC
  Administered 2013-04-04: 10 [IU] via SUBCUTANEOUS
  Filled 2013-04-04: qty 1

## 2013-04-04 MED ORDER — NAPROXEN 500 MG PO TABS: 500.0000 mg | ORAL_TABLET | Freq: Two times a day (BID) | ORAL | Status: DC

## 2013-04-04 MED ORDER — PROMETHAZINE HCL 25 MG RE SUPP: 25.0000 mg | Freq: Four times a day (QID) | RECTAL | Status: DC | PRN

## 2013-04-04 NOTE — ED Notes (Signed)
CBG 396 

## 2013-04-04 NOTE — ED Provider Notes (Signed)
CSN: [id number]     Arrival date & time 04/04/13  1731 History   First MD Initiated Contact with Patient 04/04/13 1748     Chief Complaint  Patient presents with  . Hyperglycemia   (Consider location/radiation/quality/duration/timing/severity/associated sxs/prior Treatment) HPI Comments: Pt with hx of pancreatitis, as well as kidney stones and DM - she states that she has had elevated blood sugar the last 2 days - > 600 at home - has had her sliding scale as well as her long acting lantus.  She has had assocaited abd pain on the L and n/v today.  Nothing makes better or worse, sx are persistent.  No fevers, diarrhea or rashes or swelling.  Patient is a 37 y.o. female presenting with hyperglycemia. The history is provided by the patient.  Hyperglycemia   Past Medical History  Diagnosis Date  . Diabetes mellitus   . Anemia   . Headache(784.0)   . Seasonal allergies   . Abnormal Pap smear   . Gout   . Depression   . Anxiety   . UTI (urinary tract infection)   . Bipolar 1 disorder    Past Surgical History  Procedure Laterality Date  . Cholecystectomy    . Cesarean section    . Esophagogastroduodenoscopy N/A 09/07/2012    Procedure: ESOPHAGOGASTRODUODENOSCOPY (EGD);  Surgeon: Charna Elizabeth, MD;  Location: [facility];  Service: Endoscopy;  Laterality: N/A;   Family History  Problem Relation Age of Onset  . Hypertension Mother   . Diabetes Mother   . Hypertension Maternal Aunt   . Diabetes Maternal Aunt   . Hypertension Maternal Uncle   . Diabetes Maternal Uncle   . Hypertension Maternal Grandmother    History  Substance Use Topics  . Smoking status: Current Every Day Smoker -- 0.50 packs/day    Types: Cigarettes  . Smokeless tobacco: Never Used  . Alcohol Use: No   OB History   Grav Para Term Preterm Abortions TAB SAB Ect Mult Living   8 8 7 1  0  0   7     Review of Systems  All other systems reviewed and are negative.    Allergies  Reglan and Zofran  Home  Medications   Current Outpatient Rx  Name  Route  Sig  Dispense  Refill  . alprazolam (XANAX) 2 MG tablet   Oral   Take 2 mg by mouth 3 (three) times daily as needed for sleep or anxiety.         Marland Kitchen amphetamine-dextroamphetamine (ADDERALL) 30 MG tablet   Oral   Take 30 mg by mouth 2 (two) times daily. Take 30 mg by mouth 2 times daily.         . famotidine (PEPCID) 20 MG tablet   Oral   Take 1 tablet (20 mg total) by mouth daily.   30 tablet   3   . insulin aspart (NOVOLOG) 100 UNIT/ML injection   Subcutaneous   Inject 10-20 Units into the skin 3 (three) times daily before meals. Sliding scale         . insulin glargine (LANTUS) 100 UNIT/ML injection   Subcutaneous   Inject 0.7 mLs (70 Units total) into the skin at bedtime.   10 mL   12   . mupirocin ointment (BACTROBAN) 2 %   Topical   Apply topically 2 (two) times daily. On thigh boil; use for 2 weeks   22 g   0   . oxyCODONE-acetaminophen (PERCOCET) 10-325 MG per  tablet   Oral   Take 1 tablet by mouth every 6 (six) hours as needed for pain.   120 tablet   0   . QUEtiapine (SEROQUEL) 300 MG tablet   Oral   Take 1 tablet (300 mg total) by mouth 2 (two) times daily. Take 300 mg by mouth 2 times daily.   60 tablet   0   . saccharomyces boulardii (FLORASTOR) 250 MG capsule   Oral   Take 1 capsule (250 mg total) by mouth 2 (two) times daily.   60 capsule   3   . sitaGLIPtin (JANUVIA) 50 MG tablet   Oral   Take 1 tablet (50 mg total) by mouth 2 (two) times daily. Take 50 mg by mouth 2 times daily.   60 tablet   3   . sulfamethoxazole-trimethoprim (BACTRIM DS) 800-160 MG per tablet   Oral   Take 2 tablets by mouth 2 (two) times daily. Take 2 tablets by mouth 2 times daily for 28 days.   14 tablet   0   . zolpidem (AMBIEN) 5 MG tablet   Oral   Take 1 tablet (5 mg total) by mouth at bedtime as needed for sleep (insomnia).   30 tablet   0   . naproxen (NAPROSYN) 500 MG tablet   Oral   Take 1 tablet  (500 mg total) by mouth 2 (two) times daily with a meal.   30 tablet   0   . promethazine (PHENERGAN) 25 MG suppository   Rectal   Place 1 suppository (25 mg total) rectally every 6 (six) hours as needed for nausea.   12 each   0    BP 129/74  Pulse 101  Temp(Src) 98.5 F (36.9 C) (Oral)  Resp 18  SpO2 100%  LMP 04/04/2013 Physical Exam  Nursing note and vitals reviewed. Constitutional: She appears well-developed and well-nourished. No distress.  HENT:  Head: Normocephalic and atraumatic.  Mouth/Throat: Oropharynx is clear and moist. No oropharyngeal exudate.  Eyes: Conjunctivae and EOM are normal. Pupils are equal, round, and reactive to light. Right eye exhibits no discharge. Left eye exhibits no discharge. No scleral icterus.  Neck: Normal range of motion. Neck supple. No JVD present. No thyromegaly present.  Cardiovascular: Normal rate, regular rhythm, normal heart sounds and intact distal pulses.  Exam reveals no gallop and no friction rub.   No murmur heard. Pulmonary/Chest: Effort normal and breath sounds normal. No respiratory distress. She has no wheezes. She has no rales.  Abdominal: Soft. Bowel sounds are normal. She exhibits no distension and no mass. There is tenderness.  ttp in the epigastrium and LLQ, no guarding, no peritoneal signs, no masses  Musculoskeletal: Normal range of motion. She exhibits no edema and no tenderness.  Lymphadenopathy:    She has no cervical adenopathy.  Neurological: She is alert. Coordination normal.  Skin: Skin is warm and dry. No rash noted. No erythema.  Psychiatric: She has a normal mood and affect. Her behavior is normal.    ED Course  Procedures (including critical care time) Labs Review Labs Reviewed  GLUCOSE, CAPILLARY - Abnormal; Notable for the following:    Glucose-Capillary 565 (*)    All other components within normal limits  CBC - Abnormal; Notable for the following:    Hemoglobin 9.2 (*)    HCT 30.5 (*)    MCV  61.1 (*)    MCH 18.4 (*)    RDW 21.0 (*)    Platelets 147 (*)  All other components within normal limits  COMPREHENSIVE METABOLIC PANEL - Abnormal; Notable for the following:    Sodium 128 (*)    Chloride 94 (*)    Glucose, Bld 570 (*)    BUN 5 (*)    Creatinine, Ser 0.45 (*)    Total Bilirubin 0.2 (*)    All other components within normal limits  URINALYSIS, ROUTINE W REFLEX MICROSCOPIC - Abnormal; Notable for the following:    APPearance CLOUDY (*)    Specific Gravity, Urine 1.039 (*)    Glucose, UA >1000 (*)    Hgb urine dipstick LARGE (*)    All other components within normal limits  GLUCOSE, CAPILLARY - Abnormal; Notable for the following:    Glucose-Capillary 396 (*)    All other components within normal limits  GLUCOSE, CAPILLARY - Abnormal; Notable for the following:    Glucose-Capillary 158 (*)    All other components within normal limits  LIPASE, BLOOD  URINE MICROSCOPIC-ADD ON   Imaging Review No results found.  EKG Interpretation   None       MDM   1. Hyperglycemia    Pt has recurrent abd pain, no fevers but has hyperglycemia, possible pancreatitis - check labs, lytes, fluids, antiemetics.  Laboratory workup shows hyperglycemia without ketosis, mild hyponatremia but no significant anion gap acidosis. Her hemoglobin is at 9.2 which is about baseline for the patient. Her hyperglycemia has improved significantly after IV fluids and insulin. Her discharge glucose is 158, discharged mental status is normal, patient is to followup with family doctor for ongoing diabetic control.  Meds given in ED:  Medications  sodium chloride 0.9 % bolus 1,000 mL (0 mLs Intravenous Stopped 04/04/13 2216)  sodium chloride 0.9 % bolus 1,000 mL (0 mLs Intravenous Stopped 04/04/13 2005)  promethazine (PHENERGAN) injection 25 mg (25 mg Intramuscular Given 04/04/13 1848)  insulin aspart (novoLOG) injection 10 Units (10 Units Subcutaneous Given 04/04/13 2002)  insulin aspart  (novoLOG) injection 10 Units (10 Units Subcutaneous Given 04/04/13 2215)    New Prescriptions   NAPROXEN (NAPROSYN) 500 MG TABLET    Take 1 tablet (500 mg total) by mouth 2 (two) times daily with a meal.   PROMETHAZINE (PHENERGAN) 25 MG SUPPOSITORY    Place 1 suppository (25 mg total) rectally every 6 (six) hours as needed for nausea.      Vida Roller, MD 04/04/13 [id number]

## 2013-04-04 NOTE — ED Notes (Signed)
Pt reports hx of diabetes, checked cbg 20 mins ago was 600. Reports nausea and vomiting x2 days. Hx of pancreatitis, was recently hospitalized

## 2013-04-04 NOTE — ED Notes (Signed)
Patient reports not feeling well. Blood sugar was 600 when she checked today. Taking lantus 70units, sliding scale novolog 40u with meals. However, patient has been taking 40 units at a time trying to get it down today. Last time she took 40 units of novolog was at [facility] [facility]

## 2013-04-05 DIAGNOSIS — R109 Unspecified abdominal pain: Secondary | ICD-10-CM | POA: Insufficient documentation

## 2013-04-05 DIAGNOSIS — F17200 Nicotine dependence, unspecified, uncomplicated: Secondary | ICD-10-CM | POA: Insufficient documentation

## 2013-04-05 DIAGNOSIS — Z791 Long term (current) use of non-steroidal anti-inflammatories (NSAID): Secondary | ICD-10-CM | POA: Insufficient documentation

## 2013-04-05 DIAGNOSIS — Z862 Personal history of diseases of the blood and blood-forming organs and certain disorders involving the immune mechanism: Secondary | ICD-10-CM | POA: Insufficient documentation

## 2013-04-05 DIAGNOSIS — Z3202 Encounter for pregnancy test, result negative: Secondary | ICD-10-CM | POA: Insufficient documentation

## 2013-04-05 DIAGNOSIS — Z79899 Other long term (current) drug therapy: Secondary | ICD-10-CM | POA: Insufficient documentation

## 2013-04-05 DIAGNOSIS — R112 Nausea with vomiting, unspecified: Secondary | ICD-10-CM | POA: Insufficient documentation

## 2013-04-05 DIAGNOSIS — F411 Generalized anxiety disorder: Secondary | ICD-10-CM | POA: Insufficient documentation

## 2013-04-05 DIAGNOSIS — R197 Diarrhea, unspecified: Secondary | ICD-10-CM | POA: Insufficient documentation

## 2013-04-05 DIAGNOSIS — F319 Bipolar disorder, unspecified: Secondary | ICD-10-CM | POA: Insufficient documentation

## 2013-04-05 DIAGNOSIS — Z9089 Acquired absence of other organs: Secondary | ICD-10-CM | POA: Insufficient documentation

## 2013-04-05 DIAGNOSIS — F3130 Bipolar disorder, current episode depressed, mild or moderate severity, unspecified: Secondary | ICD-10-CM | POA: Insufficient documentation

## 2013-04-05 DIAGNOSIS — Z794 Long term (current) use of insulin: Secondary | ICD-10-CM | POA: Insufficient documentation

## 2013-04-05 DIAGNOSIS — Z87440 Personal history of urinary (tract) infections: Secondary | ICD-10-CM | POA: Insufficient documentation

## 2013-04-05 DIAGNOSIS — Z9889 Other specified postprocedural states: Secondary | ICD-10-CM | POA: Insufficient documentation

## 2013-04-05 DIAGNOSIS — Z792 Long term (current) use of antibiotics: Secondary | ICD-10-CM | POA: Insufficient documentation

## 2013-04-05 DIAGNOSIS — Z87442 Personal history of urinary calculi: Secondary | ICD-10-CM | POA: Insufficient documentation

## 2013-04-05 DIAGNOSIS — E119 Type 2 diabetes mellitus without complications: Secondary | ICD-10-CM | POA: Insufficient documentation

## 2013-04-06 ENCOUNTER — Emergency Department (HOSPITAL_COMMUNITY)
Admission: EM | Admit: 2013-04-06 | Discharge: 2013-04-06 | Disposition: A | Discharge status: Home / Self Care | Payer: PRIVATE HEALTH INSURANCE

## 2013-04-06 ENCOUNTER — Emergency Department (HOSPITAL_COMMUNITY): Payer: PRIVATE HEALTH INSURANCE

## 2013-04-06 ENCOUNTER — Encounter (HOSPITAL_COMMUNITY): Payer: Self-pay | Admitting: Emergency Medicine

## 2013-04-06 ENCOUNTER — Emergency Department (HOSPITAL_COMMUNITY)
Admission: EM | Admit: 2013-04-06 | Discharge: 2013-04-07 | Disposition: A | Discharge status: Home / Self Care | Payer: PRIVATE HEALTH INSURANCE | Source: Home / Self Care | Attending: Emergency Medicine | Admitting: Emergency Medicine

## 2013-04-06 ENCOUNTER — Emergency Department (HOSPITAL_COMMUNITY)
Admission: EM | Admit: 2013-04-06 | Discharge: 2013-04-06 | Disposition: A | Discharge status: Home / Self Care | Payer: PRIVATE HEALTH INSURANCE | Source: Home / Self Care | Attending: Emergency Medicine | Admitting: Emergency Medicine

## 2013-04-06 ENCOUNTER — Emergency Department (HOSPITAL_COMMUNITY)
Admission: EM | Admit: 2013-04-06 | Discharge: 2013-04-06 | Disposition: A | Discharge status: Home / Self Care | Payer: PRIVATE HEALTH INSURANCE | Attending: Emergency Medicine | Admitting: Emergency Medicine

## 2013-04-06 DIAGNOSIS — F17200 Nicotine dependence, unspecified, uncomplicated: Secondary | ICD-10-CM | POA: Insufficient documentation

## 2013-04-06 DIAGNOSIS — Z8709 Personal history of other diseases of the respiratory system: Secondary | ICD-10-CM | POA: Insufficient documentation

## 2013-04-06 DIAGNOSIS — R109 Unspecified abdominal pain: Secondary | ICD-10-CM

## 2013-04-06 DIAGNOSIS — Z791 Long term (current) use of non-steroidal anti-inflammatories (NSAID): Secondary | ICD-10-CM | POA: Insufficient documentation

## 2013-04-06 DIAGNOSIS — R112 Nausea with vomiting, unspecified: Secondary | ICD-10-CM

## 2013-04-06 DIAGNOSIS — F411 Generalized anxiety disorder: Secondary | ICD-10-CM | POA: Insufficient documentation

## 2013-04-06 DIAGNOSIS — R739 Hyperglycemia, unspecified: Secondary | ICD-10-CM

## 2013-04-06 DIAGNOSIS — R1031 Right lower quadrant pain: Secondary | ICD-10-CM | POA: Insufficient documentation

## 2013-04-06 DIAGNOSIS — Z79899 Other long term (current) drug therapy: Secondary | ICD-10-CM | POA: Insufficient documentation

## 2013-04-06 DIAGNOSIS — R197 Diarrhea, unspecified: Secondary | ICD-10-CM | POA: Insufficient documentation

## 2013-04-06 DIAGNOSIS — Z862 Personal history of diseases of the blood and blood-forming organs and certain disorders involving the immune mechanism: Secondary | ICD-10-CM | POA: Insufficient documentation

## 2013-04-06 DIAGNOSIS — Z794 Long term (current) use of insulin: Secondary | ICD-10-CM | POA: Insufficient documentation

## 2013-04-06 DIAGNOSIS — F3130 Bipolar disorder, current episode depressed, mild or moderate severity, unspecified: Secondary | ICD-10-CM | POA: Insufficient documentation

## 2013-04-06 DIAGNOSIS — E119 Type 2 diabetes mellitus without complications: Secondary | ICD-10-CM | POA: Insufficient documentation

## 2013-04-06 DIAGNOSIS — Z87442 Personal history of urinary calculi: Secondary | ICD-10-CM | POA: Insufficient documentation

## 2013-04-06 DIAGNOSIS — Z87440 Personal history of urinary (tract) infections: Secondary | ICD-10-CM | POA: Insufficient documentation

## 2013-04-06 LAB — CBC
MCHC: 30 g/dL (ref 30.0–36.0)
MCV: 60.5 fL — ABNORMAL LOW (ref 78.0–100.0)
Platelets: 149 10*3/uL — ABNORMAL LOW (ref 150–400)
RBC: 5.62 MIL/uL — ABNORMAL HIGH (ref 3.87–5.11)
RDW: 20.2 % — ABNORMAL HIGH (ref 11.5–15.5)
WBC: 13.6 10*3/uL — ABNORMAL HIGH (ref 4.0–10.5)

## 2013-04-06 LAB — URINALYSIS, ROUTINE W REFLEX MICROSCOPIC
Bilirubin Urine: NEGATIVE
Glucose, UA: >1000 mg/dL — AB
Ketones, ur: NEGATIVE mg/dL
Ketones, ur: NEGATIVE mg/dL
Leukocytes, UA: NEGATIVE
Leukocytes, UA: NEGATIVE
Nitrite: NEGATIVE
Nitrite: NEGATIVE
Protein, ur: NEGATIVE mg/dL
Specific Gravity, Urine: 1.035 — ABNORMAL HIGH (ref 1.005–1.030)
Specific Gravity, Urine: 1.037 — ABNORMAL HIGH (ref 1.005–1.030)
Urobilinogen, UA: 0.2 mg/dL (ref 0.0–1.0)
Urobilinogen, UA: 0.2 mg/dL (ref 0.0–1.0)
pH: 6.5 (ref 5.0–8.0)
pH: 7 (ref 5.0–8.0)

## 2013-04-06 LAB — GLUCOSE, CAPILLARY
Glucose-Capillary: 256 mg/dL — ABNORMAL HIGH (ref 70–99)
Glucose-Capillary: 397 mg/dL — ABNORMAL HIGH (ref 70–99)
Glucose-Capillary: >600 mg/dL (ref 70–99)

## 2013-04-06 LAB — POCT PREGNANCY, URINE: Preg Test, Ur: NEGATIVE

## 2013-04-06 LAB — CBC WITH DIFFERENTIAL/PLATELET
Basophils Absolute: 0.1 10*3/uL (ref 0.0–0.1)
Basophils Relative: 1 % (ref 0–1)
Eosinophils Absolute: 0.3 10*3/uL (ref 0.0–0.7)
Eosinophils Relative: 3 % (ref 0–5)
HCT: 31.1 % — ABNORMAL LOW (ref 36.0–46.0)
Hemoglobin: 9.2 g/dL — ABNORMAL LOW (ref 12.0–15.0)
Lymphocytes Relative: 21 % (ref 12–46)
Lymphs Abs: 2 10*3/uL (ref 0.7–4.0)
MCH: 18.4 pg — ABNORMAL LOW (ref 26.0–34.0)
MCHC: 29.6 g/dL — ABNORMAL LOW (ref 30.0–36.0)
MCV: 62.3 fL — ABNORMAL LOW (ref 78.0–100.0)
Monocytes Absolute: 0.7 10*3/uL (ref 0.1–1.0)
Monocytes Relative: 7 % (ref 3–12)
Neutro Abs: 6.3 10*3/uL (ref 1.7–7.7)
Neutrophils Relative %: 68 % (ref 43–77)
Platelets: 148 10*3/uL — ABNORMAL LOW (ref 150–400)
RBC: 4.99 MIL/uL (ref 3.87–5.11)
RDW: 21.2 % — ABNORMAL HIGH (ref 11.5–15.5)
WBC: 9.4 10*3/uL (ref 4.0–10.5)

## 2013-04-06 LAB — URINE MICROSCOPIC-ADD ON

## 2013-04-06 LAB — COMPREHENSIVE METABOLIC PANEL
ALT: 10 U/L (ref 0–35)
Albumin: 3.4 g/dL — ABNORMAL LOW (ref 3.5–5.2)
Alkaline Phosphatase: 94 U/L (ref 39–117)
BUN: 6 mg/dL (ref 6–23)
CO2: 20 mEq/L (ref 19–32)
Chloride: 93 mEq/L — ABNORMAL LOW (ref 96–112)
Creatinine, Ser: 0.58 mg/dL (ref 0.50–1.10)
GFR calc Af Amer: >90 mL/min (ref >90)
GFR calc non Af Amer: >90 mL/min (ref >90)
Glucose, Bld: 675 mg/dL (ref 70–99)
Potassium: 4.1 mEq/L (ref 3.5–5.1)
Sodium: 128 mEq/L — ABNORMAL LOW (ref 135–145)
Total Bilirubin: 0.2 mg/dL — ABNORMAL LOW (ref 0.3–1.2)

## 2013-04-06 LAB — BASIC METABOLIC PANEL
BUN: 6 mg/dL (ref 6–23)
Chloride: 98 mEq/L (ref 96–112)
Creatinine, Ser: 0.35 mg/dL — ABNORMAL LOW (ref 0.50–1.10)
GFR calc Af Amer: >90 mL/min (ref >90)
Potassium: 3.7 mEq/L (ref 3.5–5.1)
Sodium: 133 mEq/L — ABNORMAL LOW (ref 135–145)

## 2013-04-06 LAB — CG4 I-STAT (LACTIC ACID): Lactic Acid, Venous: 5.3 mmol/L — ABNORMAL HIGH (ref 0.5–2.2)

## 2013-04-06 LAB — LIPASE, BLOOD: Lipase: 44 U/L (ref 11–59)

## 2013-04-06 LAB — PREGNANCY, URINE: Preg Test, Ur: NEGATIVE

## 2013-04-06 MED ORDER — INSULIN GLARGINE 100 UNIT/ML ~~LOC~~ SOLN: 80.0000 [IU] | Freq: Every day | SUBCUTANEOUS | Status: DC

## 2013-04-06 MED ORDER — SODIUM CHLORIDE 0.9 % IV BOLUS (SEPSIS)
1000.0000 mL | Freq: Once | INTRAVENOUS | Status: AC
  Administered 2013-04-06: 1000 mL via INTRAVENOUS

## 2013-04-06 MED ORDER — SODIUM CHLORIDE 0.9 % IV SOLN
1000.0000 mL | INTRAVENOUS | Status: DC
  Administered 2013-04-06: 1000 mL via INTRAVENOUS

## 2013-04-06 MED ORDER — KETOROLAC TROMETHAMINE 30 MG/ML IJ SOLN
30.0000 mg | Freq: Once | INTRAMUSCULAR | Status: AC
  Administered 2013-04-06: 30 mg via INTRAVENOUS
  Filled 2013-04-06: qty 1

## 2013-04-06 MED ORDER — SODIUM CHLORIDE 0.9 % IV BOLUS (SEPSIS): 2000.0000 mL | Freq: Once | INTRAVENOUS | Status: DC

## 2013-04-06 MED ORDER — LORAZEPAM 2 MG/ML IJ SOLN
1.0000 mg | Freq: Once | INTRAMUSCULAR | Status: AC
  Administered 2013-04-06: 1 mg via INTRAVENOUS
  Filled 2013-04-06: qty 1

## 2013-04-06 MED ORDER — INSULIN ASPART 100 UNIT/ML ~~LOC~~ SOLN
15.0000 [IU] | Freq: Once | SUBCUTANEOUS | Status: AC
  Administered 2013-04-06: 15 [IU] via INTRAVENOUS
  Filled 2013-04-06: qty 1

## 2013-04-06 MED ORDER — DICYCLOMINE HCL 20 MG PO TABS: 20.0000 mg | ORAL_TABLET | Freq: Four times a day (QID) | ORAL | Status: DC | PRN

## 2013-04-06 MED ORDER — SODIUM CHLORIDE 0.9 % IV SOLN
1000.0000 mL | Freq: Once | INTRAVENOUS | Status: AC
  Administered 2013-04-06: 1000 mL via INTRAVENOUS

## 2013-04-06 MED ORDER — PROMETHAZINE HCL 25 MG/ML IJ SOLN
12.5000 mg | Freq: Once | INTRAMUSCULAR | Status: AC
  Administered 2013-04-06: 12.5 mg via INTRAVENOUS
  Filled 2013-04-06: qty 1

## 2013-04-06 MED ORDER — INSULIN ASPART 100 UNIT/ML ~~LOC~~ SOLN
10.0000 [IU] | Freq: Once | SUBCUTANEOUS | Status: AC
  Administered 2013-04-06: 10 [IU] via INTRAVENOUS
  Filled 2013-04-06: qty 1

## 2013-04-06 MED ORDER — PROMETHAZINE HCL 25 MG/ML IJ SOLN
25.0000 mg | Freq: Once | INTRAMUSCULAR | Status: AC
  Administered 2013-04-06: 25 mg via INTRAMUSCULAR
  Filled 2013-04-06: qty 1

## 2013-04-06 NOTE — ED Notes (Signed)
Pt out in hallway c/o of nausea/vomiting. Pt vomiting in floor. Asked why she didn't use vomit bag, stated "I didn't feel like it". Pt informed that Dr Patria Mane is in an emergency.

## 2013-04-06 NOTE — ED Provider Notes (Signed)
CSN: [id number]     Arrival date & time 04/06/13  2022 History   First MD Initiated Contact with Patient 04/06/13 2208     Chief Complaint  Patient presents with  . Abdominal Pain   (Consider location/radiation/quality/duration/timing/severity/associated sxs/prior Treatment) HPI Comments: Patient presents today with a chief complaint of right flank pain radiating to the RLQ.  Pain is chronic, however, she reports that the pain has been worse over the past 3 days.  She is also complaining of nausea, vomiting, and diarrhea that has been present for the past 3 days.  Symptoms are reoccurring.  This is her fourth ED visit for the same in the past 3 days.  She was seen earlier today twice for the same.  During her ED visit earlier today she had a Renal Ultrasound performed,which showed a right sided kidney stone of the right upper pole, but no hydronephrosis or ureteral stone.  She reports that she is taking Percocet and Phenergan at home for nausea, but does not feel that it helps.  She was recently admitted the hospital for similar symptoms and much of her nausea and vomiting was thought to be secondary to chronic narcotic use.  Patient has had numerous CT scans of her abdomen, gastric emptying study, and EGD.  All previous imaging is unremarkable.  Patient denies urinary symptoms, fever, or chills.  The history is provided by the patient.    Past Medical History  Diagnosis Date  . Diabetes mellitus   . Anemia   . Headache(784.0)   . Seasonal allergies   . Abnormal Pap smear   . Gout   . Depression   . Anxiety   . UTI (urinary tract infection)   . Bipolar 1 disorder    Past Surgical History  Procedure Laterality Date  . Cholecystectomy    . Cesarean section    . Esophagogastroduodenoscopy N/A 09/07/2012    Procedure: ESOPHAGOGASTRODUODENOSCOPY (EGD);  Surgeon: Charna Elizabeth, MD;  Location: [facility] ENDOSCOPY;  Service: Endoscopy;  Laterality: N/A;   Family History  Problem Relation Age of  Onset  . Hypertension Mother   . Diabetes Mother   . Hypertension Maternal Aunt   . Diabetes Maternal Aunt   . Hypertension Maternal Uncle   . Diabetes Maternal Uncle   . Hypertension Maternal Grandmother    History  Substance Use Topics  . Smoking status: Current Every Day Smoker -- 0.50 packs/day    Types: Cigarettes  . Smokeless tobacco: Never Used  . Alcohol Use: No   OB History   Grav Para Term Preterm Abortions TAB SAB Ect Mult Living   8 8 7 1  0  0   7     Review of Systems  Gastrointestinal: Positive for nausea, vomiting, abdominal pain and diarrhea.  Genitourinary: Positive for flank pain.  All other systems reviewed and are negative.    Allergies  Reglan and Zofran  Home Medications   Current Outpatient Rx  Name  Route  Sig  Dispense  Refill  . alprazolam (XANAX) 2 MG tablet   Oral   Take 2 mg by mouth 3 (three) times daily as needed for sleep or anxiety.         Marland Kitchen amphetamine-dextroamphetamine (ADDERALL) 30 MG tablet   Oral   Take 30 mg by mouth 2 (two) times daily. Take 30 mg by mouth 2 times daily.         Marland Kitchen dicyclomine (BENTYL) 20 MG tablet   Oral   Take 1 tablet (  20 mg total) by mouth every 6 (six) hours as needed (for abdominal cramping).   20 tablet   0   . famotidine (PEPCID) 20 MG tablet   Oral   Take 1 tablet (20 mg total) by mouth daily.   30 tablet   3   . insulin aspart (NOVOLOG) 100 UNIT/ML injection   Subcutaneous   Inject 10-20 Units into the skin 3 (three) times daily before meals. Sliding scale         . insulin glargine (LANTUS) 100 UNIT/ML injection   Subcutaneous   Inject 0.8 mLs (80 Units total) into the skin at bedtime.   10 mL   12   . mupirocin ointment (BACTROBAN) 2 %   Topical   Apply topically 2 (two) times daily. On thigh boil; use for 2 weeks   22 g   0   . naproxen (NAPROSYN) 500 MG tablet   Oral   Take 1 tablet (500 mg total) by mouth 2 (two) times daily with a meal.   30 tablet   0   .  oxyCODONE-acetaminophen (PERCOCET) 10-325 MG per tablet   Oral   Take 1 tablet by mouth every 6 (six) hours as needed for pain.   120 tablet   0   . promethazine (PHENERGAN) 25 MG suppository   Rectal   Place 1 suppository (25 mg total) rectally every 6 (six) hours as needed for nausea.   12 each   0   . QUEtiapine (SEROQUEL) 300 MG tablet   Oral   Take 1 tablet (300 mg total) by mouth 2 (two) times daily. Take 300 mg by mouth 2 times daily.   60 tablet   0   . saccharomyces boulardii (FLORASTOR) 250 MG capsule   Oral   Take 1 capsule (250 mg total) by mouth 2 (two) times daily.   60 capsule   3   . sitaGLIPtin (JANUVIA) 50 MG tablet   Oral   Take 1 tablet (50 mg total) by mouth 2 (two) times daily. Take 50 mg by mouth 2 times daily.   60 tablet   3   . sulfamethoxazole-trimethoprim (BACTRIM DS) 800-160 MG per tablet   Oral   Take 2 tablets by mouth 2 (two) times daily. Take 2 tablets by mouth 2 times daily for 28 days.   14 tablet   0   . zolpidem (AMBIEN) 5 MG tablet   Oral   Take 1 tablet (5 mg total) by mouth at bedtime as needed for sleep (insomnia).   30 tablet   0    BP 172/76  Pulse 106  Temp(Src) 98.8 F (37.1 C) (Oral)  Resp 20  Ht 5\' 6"  (1.676 m)  Wt 200 lb (90.719 kg)  BMI 32.3 kg/m2  SpO2 100%  LMP 04/04/2013 Physical Exam  Nursing note and vitals reviewed. Constitutional: She appears well-developed and well-nourished.  HENT:  Head: Normocephalic and atraumatic.  Mouth/Throat: Oropharynx is clear and moist.  Neck: Normal range of motion. Neck supple.  Cardiovascular: Normal rate, regular rhythm and normal heart sounds.   Pulmonary/Chest: Effort normal and breath sounds normal.  Abdominal: Soft. Bowel sounds are normal. She exhibits no distension and no mass. There is no tenderness. There is no rebound and no guarding.  No abdominal tenderness to palpation when palpating the abdomen while distracted  Neurological: She is alert.  Skin: Skin  is warm and dry.  Psychiatric: She has a normal mood and affect.  ED Course  Procedures (including critical care time) Labs Review Labs Reviewed  GLUCOSE, CAPILLARY - Abnormal; Notable for the following:    Glucose-Capillary 366 (*)    All other components within normal limits   Imaging Review US Renal  04/06/2013   CLINICAL DATA:  Vomiting, history of kidney stones  EXAM: RENAL/URINARY TRACT ULTRASOUND COMPLETE  COMPARISON:  03/08/2013  FINDINGS: Right Kidney  Length: 13.4 cm. 11 mm upper pole calculus. No hydronephrosis.  Left Kidney  Incompletely visualized. No gross hydronephrosis.  Bladder  Not visualized, possibly related to underdistention.  IMPRESSION: 11 mm right upper pole renal calculus.  No hydronephrosis.  Left kidney is incompletely visualized.   Electronically Signed   By: Charline Bills M.D.   On: 04/06/2013 14:53    EKG Interpretation   None      Patient discussed with Dr. Denton Lank.   MDM  No diagnosis found. Patient presents today with a chief complaint of nausea, vomiting, diarrhea, and abdominal pain.  Patient with numerous ED visits for the same.  This is her third ED visit today for the same.  Patient requesting IV narcotics.  Review of the chart shows that there has been concern with drug seeking behavior during past admissions.  Therefore, patient informed that she will not be given IV narcotics.  Patient stated that she did not want Toradol because it does not help.  Patient given IM Phenergan in the ED for nausea.  Nausea improved somewhat.  No vomiting evidenced in the ED.  Patient is afebrile.  No abdominal pain on abdominal exam when the patient was distracted.  Labs earlier today unremarkable.  Renal ultrasound earlier today showing no ureteral stone or hydronephrosis.  Feel that the patient is stable for discharge.  Patient instructed to follow up with her PCP for management of chronic pain.    Pascal Lux Nelson, PA-C 04/07/13 [phone]

## 2013-04-06 NOTE — ED Notes (Signed)
The pt was seen at [facility] ed yesterday for a high blood sugar

## 2013-04-06 NOTE — ED Notes (Signed)
CRITICAL VALUE ALERT  Critical value received:  Blood Glucose   Date of notification:  04/06/2013  Time of notification:  0133  Critical value read back: Yes  Nurse who received alert:  Eston Esters RN  MD notified:  Norlene Campbell MD  Time of [phone]

## 2013-04-06 NOTE — ED Notes (Signed)
Patient transported to Ultrasound 

## 2013-04-06 NOTE — ED Notes (Signed)
Bed: WA08 Expected date:  Expected time:  Means of arrival:  Comments: ems- vomited, hyperglycemia

## 2013-04-06 NOTE — ED Notes (Signed)
Pt c/o of hyperglycemia CBG 328. Nausea/vomting/dirrhea. Hypertension 178/82.
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
## 2013-04-06 NOTE — ED Provider Notes (Signed)
CSN: [id number]     Arrival date & time 04/06/13  1202 History   First MD Initiated Contact with Patient 04/06/13 1205     Chief Complaint  Patient presents with  . Hyperglycemia    HPI Patient presents the emergency department with a complaint of nausea vomiting and diarrhea.  The patient is well-known to this emergency department with recurrent visits for abdominal pain nausea and vomiting.  She was recently admitted the hospital for similar symptoms and much of her nausea and vomiting was thought to be secondary to chronic narcotic use.  The patient denies fevers and chills.  No hematemesis.  No melena or hematochezia.  She was last seen in the emergency department yesterday for flank pain and she has a history of known renal calculi without recently diagnosed ureteral calculi.  No urinary symptoms.  She reports some occasional right-sided abdominal pain.  Her pain is moderate to severe in severity at this time.  She also reports nausea.  Unfortunately she is allergic to Reglan and Zofran.   Past Medical History  Diagnosis Date  . Diabetes mellitus   . Anemia   . Headache(784.0)   . Seasonal allergies   . Abnormal Pap smear   . Gout   . Depression   . Anxiety   . UTI (urinary tract infection)   . Bipolar 1 disorder    Past Surgical History  Procedure Laterality Date  . Cholecystectomy    . Cesarean section    . Esophagogastroduodenoscopy N/A 09/07/2012    Procedure: ESOPHAGOGASTRODUODENOSCOPY (EGD);  Surgeon: Charna Elizabeth, MD;  Location: [facility] ENDOSCOPY;  Service: Endoscopy;  Laterality: N/A;   Family History  Problem Relation Age of Onset  . Hypertension Mother   . Diabetes Mother   . Hypertension Maternal Aunt   . Diabetes Maternal Aunt   . Hypertension Maternal Uncle   . Diabetes Maternal Uncle   . Hypertension Maternal Grandmother    History  Substance Use Topics  . Smoking status: Current Every Day Smoker -- 0.50 packs/day    Types: Cigarettes  . Smokeless tobacco:  Never Used  . Alcohol Use: No   OB History   Grav Para Term Preterm Abortions TAB SAB Ect Mult Living   8 8 7 1  0  0   7     Review of Systems  All other systems reviewed and are negative.    Allergies  Reglan and Zofran  Home Medications   Current Outpatient Rx  Name  Route  Sig  Dispense  Refill  . insulin glargine (LANTUS) 100 UNIT/ML injection   Subcutaneous   Inject 0.8 mLs (80 Units total) into the skin at bedtime.   10 mL   12   . mupirocin ointment (BACTROBAN) 2 %   Topical   Apply topically 2 (two) times daily. On thigh boil; use for 2 weeks   22 g   0   . promethazine (PHENERGAN) 25 MG suppository   Rectal   Place 1 suppository (25 mg total) rectally every 6 (six) hours as needed for nausea.   12 each   0   . QUEtiapine (SEROQUEL) 300 MG tablet   Oral   Take 1 tablet (300 mg total) by mouth 2 (two) times daily. Take 300 mg by mouth 2 times daily.   60 tablet   0   . alprazolam (XANAX) 2 MG tablet   Oral   Take 2 mg by mouth 3 (three) times daily as needed for sleep  or anxiety.         Marland Kitchen amphetamine-dextroamphetamine (ADDERALL) 30 MG tablet   Oral   Take 30 mg by mouth 2 (two) times daily. Take 30 mg by mouth 2 times daily.         Marland Kitchen dicyclomine (BENTYL) 20 MG tablet   Oral   Take 1 tablet (20 mg total) by mouth every 6 (six) hours as needed (for abdominal cramping).   20 tablet   0   . famotidine (PEPCID) 20 MG tablet   Oral   Take 1 tablet (20 mg total) by mouth daily.   30 tablet   3   . insulin aspart (NOVOLOG) 100 UNIT/ML injection   Subcutaneous   Inject 10-20 Units into the skin 3 (three) times daily before meals. Sliding scale         . naproxen (NAPROSYN) 500 MG tablet   Oral   Take 1 tablet (500 mg total) by mouth 2 (two) times daily with a meal.   30 tablet   0   . oxyCODONE-acetaminophen (PERCOCET) 10-325 MG per tablet   Oral   Take 1 tablet by mouth every 6 (six) hours as needed for pain.   120 tablet   0   .  saccharomyces boulardii (FLORASTOR) 250 MG capsule   Oral   Take 1 capsule (250 mg total) by mouth 2 (two) times daily.   60 capsule   3   . sitaGLIPtin (JANUVIA) 50 MG tablet   Oral   Take 1 tablet (50 mg total) by mouth 2 (two) times daily. Take 50 mg by mouth 2 times daily.   60 tablet   3   . sulfamethoxazole-trimethoprim (BACTRIM DS) 800-160 MG per tablet   Oral   Take 2 tablets by mouth 2 (two) times daily. Take 2 tablets by mouth 2 times daily for 28 days.   14 tablet   0   . zolpidem (AMBIEN) 5 MG tablet   Oral   Take 1 tablet (5 mg total) by mouth at bedtime as needed for sleep (insomnia).   30 tablet   0    BP 162/67  Pulse 99  Temp(Src) 98.9 F (37.2 C) (Oral)  Resp 18  SpO2 100%  LMP 04/04/2013 Physical Exam  Nursing note and vitals reviewed. Constitutional: She is oriented to person, place, and time. She appears well-developed and well-nourished. No distress.  HENT:  Head: Normocephalic and atraumatic.  Eyes: EOM are normal.  Neck: Normal range of motion.  Cardiovascular: Normal rate, regular rhythm and normal heart sounds.   Pulmonary/Chest: Effort normal and breath sounds normal.  Abdominal: Soft. She exhibits no distension. There is no tenderness.  Musculoskeletal: Normal range of motion.  Neurological: She is alert and oriented to person, place, and time.  Skin: Skin is warm and dry.  Psychiatric: She has a normal mood and affect. Judgment normal.    ED Course  Procedures (including critical care time) Labs Review Labs Reviewed  CBC - Abnormal; Notable for the following:    WBC 13.6 (*)    RBC 5.62 (*)    Hemoglobin 10.2 (*)    HCT 34.0 (*)    MCV 60.5 (*)    MCH 18.1 (*)    RDW 20.2 (*)    Platelets 149 (*)    All other components within normal limits  BASIC METABOLIC PANEL - Abnormal; Notable for the following:    Sodium 133 (*)    Glucose, Bld 372 (*)  Creatinine, Ser 0.35 (*)    All other components within normal limits   URINALYSIS, ROUTINE W REFLEX MICROSCOPIC - Abnormal; Notable for the following:    Specific Gravity, Urine 1.037 (*)    Glucose, UA >1000 (*)    Hgb urine dipstick LARGE (*)    Protein, ur 100 (*)    All other components within normal limits  GLUCOSE, CAPILLARY - Abnormal; Notable for the following:    Glucose-Capillary 397 (*)    All other components within normal limits  PREGNANCY, URINE  URINE MICROSCOPIC-ADD ON   Imaging Review US Renal  04/06/2013   CLINICAL DATA:  Vomiting, history of kidney stones  EXAM: RENAL/URINARY TRACT ULTRASOUND COMPLETE  COMPARISON:  03/08/2013  FINDINGS: Right Kidney  Length: 13.4 cm. 11 mm upper pole calculus. No hydronephrosis.  Left Kidney  Incompletely visualized. No gross hydronephrosis.  Bladder  Not visualized, possibly related to underdistention.  IMPRESSION: 11 mm right upper pole renal calculus.  No hydronephrosis.  Left kidney is incompletely visualized.   Electronically Signed   By: Charline Bills M.D.   On: 04/06/2013 14:53    EKG Interpretation   None       MDM   1. Abdominal pain   2. Nausea & vomiting    This is a recurrent issue for this patient.  Her pulse rate seems to be improving with IV fluids.  She's been hydrated here in the emergency apartment.  She has both pain medicine and nausea medicine at home.  Medical screening examination completed.  No evidence of right-sided hydronephrosis on renal ultrasound.  No indication for additional imaging or testing.  No signs of diabetic ketoacidosis.  Discharge home with PCP followup.    Lyanne Co, MD 04/06/13 1630

## 2013-04-06 NOTE — ED Notes (Signed)
Patient arrived via EMS  Was seen here last night and then [facility] today for the same.  CBG 315 by EMS  Here due to abd pain and hyperglycemia.

## 2013-04-06 NOTE — ED Notes (Signed)
CBG read HI. 

## 2013-04-06 NOTE — ED Notes (Signed)
Pt requests to see charge nurse, pt reported she still did not feel well and wondered why she was not receiving phenergan. Md made patient aware early on pt was not going to receive phenergan. Charge explained that md determined pt was medically stable and ready for discharge. Pt reports that she will have to go to another hospital then. Pt already seen at [facility] this morning. Charge explained that pt needs to be seen by specialist to treat GI issue.  Pt alert and oriented x4. Respirations even and unlabored, bilateral symmetrical rise and fall of chest. Skin warm and dry. In no acute distress. Patient advocate wheeled pt to discharge window.

## 2013-04-06 NOTE — ED Notes (Signed)
Patient present svia EMS with c/o abd pain and hyperglycemia.  Was seen at [facility] last night and [facility] this afternoon

## 2013-04-06 NOTE — ED Notes (Signed)
[facility] just called to ask [place] to discharge pt since she just came to [place] to check in.

## 2013-04-06 NOTE — ED Notes (Signed)
Pt brought in by EMS; left [facility] AMA; c/o right lower abd pain; elevated glucose

## 2013-04-06 NOTE — ED Provider Notes (Signed)
CSN: [id number]     Arrival date & time 04/05/13  2359 History   First MD Initiated Contact with Patient 04/06/13 0027     Chief Complaint  Patient presents with  . Flank Pain   (Consider location/radiation/quality/duration/timing/severity/associated sxs/prior Treatment) HPI 37 yo female presents to the ER from home with complaint of right flank pain.  Pt reports sxs started this morning.  She reports her sugars have been running high.  She had blood sugars in 400s-500s at home, reports she has been taking her lantus 70 units qday and novolog per her sliding scale.  She reports no improvement with three doses of novolog, 30 units about 3-4 hours apart.  No fevers, chills.  She reports nausea and vomiting.  She is concerned her pancreatitis or kidney stones are causing her problems.  Pt was seen in the ER last night with hyperglycemia as well.  Pt reports she does not have a pcm, has been trying to get into several clinics.    Past Medical History  Diagnosis Date  . Diabetes mellitus   . Anemia   . Headache(784.0)   . Seasonal allergies   . Abnormal Pap smear   . Gout   . Depression   . Anxiety   . UTI (urinary tract infection)   . Bipolar 1 disorder    Past Surgical History  Procedure Laterality Date  . Cholecystectomy    . Cesarean section    . Esophagogastroduodenoscopy N/A 09/07/2012    Procedure: ESOPHAGOGASTRODUODENOSCOPY (EGD);  Surgeon: Charna Elizabeth, MD;  Location: [facility] ENDOSCOPY;  Service: Endoscopy;  Laterality: N/A;   Family History  Problem Relation Age of Onset  . Hypertension Mother   . Diabetes Mother   . Hypertension Maternal Aunt   . Diabetes Maternal Aunt   . Hypertension Maternal Uncle   . Diabetes Maternal Uncle   . Hypertension Maternal Grandmother    History  Substance Use Topics  . Smoking status: Current Every Day Smoker -- 0.50 packs/day    Types: Cigarettes  . Smokeless tobacco: Never Used  . Alcohol Use: No   OB History   Grav Para Term Preterm  Abortions TAB SAB Ect Mult Living   8 8 7 1  0  0   7     Review of Systems  All other systems reviewed and are negative.    Allergies  Reglan and Zofran  Home Medications   Current Outpatient Rx  Name  Route  Sig  Dispense  Refill  . alprazolam (XANAX) 2 MG tablet   Oral   Take 2 mg by mouth 3 (three) times daily as needed for sleep or anxiety.         Marland Kitchen amphetamine-dextroamphetamine (ADDERALL) 30 MG tablet   Oral   Take 30 mg by mouth 2 (two) times daily. Take 30 mg by mouth 2 times daily.         . famotidine (PEPCID) 20 MG tablet   Oral   Take 1 tablet (20 mg total) by mouth daily.   30 tablet   3   . insulin aspart (NOVOLOG) 100 UNIT/ML injection   Subcutaneous   Inject 10-20 Units into the skin 3 (three) times daily before meals. Sliding scale         . insulin glargine (LANTUS) 100 UNIT/ML injection   Subcutaneous   Inject 0.7 mLs (70 Units total) into the skin at bedtime.   10 mL   12   . mupirocin ointment (BACTROBAN) 2 %  Topical   Apply topically 2 (two) times daily. On thigh boil; use for 2 weeks   22 g   0   . naproxen (NAPROSYN) 500 MG tablet   Oral   Take 1 tablet (500 mg total) by mouth 2 (two) times daily with a meal.   30 tablet   0   . oxyCODONE-acetaminophen (PERCOCET) 10-325 MG per tablet   Oral   Take 1 tablet by mouth every 6 (six) hours as needed for pain.   120 tablet   0   . promethazine (PHENERGAN) 25 MG suppository   Rectal   Place 1 suppository (25 mg total) rectally every 6 (six) hours as needed for nausea.   12 each   0   . QUEtiapine (SEROQUEL) 300 MG tablet   Oral   Take 1 tablet (300 mg total) by mouth 2 (two) times daily. Take 300 mg by mouth 2 times daily.   60 tablet   0   . saccharomyces boulardii (FLORASTOR) 250 MG capsule   Oral   Take 1 capsule (250 mg total) by mouth 2 (two) times daily.   60 capsule   3   . sitaGLIPtin (JANUVIA) 50 MG tablet   Oral   Take 1 tablet (50 mg total) by mouth 2  (two) times daily. Take 50 mg by mouth 2 times daily.   60 tablet   3   . sulfamethoxazole-trimethoprim (BACTRIM DS) 800-160 MG per tablet   Oral   Take 2 tablets by mouth 2 (two) times daily. Take 2 tablets by mouth 2 times daily for 28 days.   14 tablet   0   . zolpidem (AMBIEN) 5 MG tablet   Oral   Take 1 tablet (5 mg total) by mouth at bedtime as needed for sleep (insomnia).   30 tablet   0    BP 160/68  Pulse 114  Temp(Src) 98.2 F (36.8 C) (Oral)  Resp 16  SpO2 100%  LMP 04/04/2013 Physical Exam  Nursing note and vitals reviewed. Constitutional: She is oriented to person, place, and time. She appears well-developed and well-nourished.  Obese, NAD  HENT:  Head: Normocephalic and atraumatic.  Nose: Nose normal.  Mouth/Throat: Oropharynx is clear and moist.  Eyes: Conjunctivae and EOM are normal. Pupils are equal, round, and reactive to light.  Neck: Normal range of motion. Neck supple. No JVD present. No tracheal deviation present. No thyromegaly present.  Cardiovascular: Normal rate, regular rhythm, normal heart sounds and intact distal pulses.  Exam reveals no gallop and no friction rub.   No murmur heard. Pulmonary/Chest: Effort normal and breath sounds normal. No stridor. No respiratory distress. She has no wheezes. She has no rales. She exhibits no tenderness.  Abdominal: Soft. Bowel sounds are normal. She exhibits no distension and no mass. There is tenderness (mild right sided abd pain). There is no rebound and no guarding.  Musculoskeletal: Normal range of motion. She exhibits no edema and no tenderness.  Lymphadenopathy:    She has no cervical adenopathy.  Neurological: She is alert and oriented to person, place, and time. She exhibits normal muscle tone. Coordination normal.  Skin: Skin is warm and dry. No rash noted. No erythema. No pallor.  Psychiatric: She has a normal mood and affect. Her behavior is normal. Judgment and thought content normal.    ED  Course  Procedures (including critical care time) Labs Review Labs Reviewed  CBC WITH DIFFERENTIAL - Abnormal; Notable for the following:  Hemoglobin 9.2 (*)    HCT 31.1 (*)    MCV 62.3 (*)    MCH 18.4 (*)    MCHC 29.6 (*)    RDW 21.2 (*)    Platelets 148 (*)    All other components within normal limits  COMPREHENSIVE METABOLIC PANEL - Abnormal; Notable for the following:    Sodium 128 (*)    Chloride 93 (*)    Glucose, Bld 675 (*)    Albumin 3.4 (*)    Total Bilirubin 0.2 (*)    All other components within normal limits  URINALYSIS, ROUTINE W REFLEX MICROSCOPIC - Abnormal; Notable for the following:    Specific Gravity, Urine 1.035 (*)    Glucose, UA >1000 (*)    Hgb urine dipstick LARGE (*)    All other components within normal limits  GLUCOSE, CAPILLARY - Abnormal; Notable for the following:    Glucose-Capillary >600 (*)    All other components within normal limits  URINE MICROSCOPIC-ADD ON - Abnormal; Notable for the following:    Squamous Epithelial / LPF FEW (*)    All other components within normal limits  GLUCOSE, CAPILLARY - Abnormal; Notable for the following:    Glucose-Capillary 455 (*)    All other components within normal limits  GLUCOSE, CAPILLARY - Abnormal; Notable for the following:    Glucose-Capillary 384 (*)    All other components within normal limits  GLUCOSE, CAPILLARY - Abnormal; Notable for the following:    Glucose-Capillary 256 (*)    All other components within normal limits  CG4 I-STAT (LACTIC ACID) - Abnormal; Notable for the following:    Lactic Acid, Venous 5.30 (*)    All other components within normal limits  LIPASE, BLOOD  POCT PREGNANCY, URINE   Imaging Review No results found.  EKG Interpretation   None       MDM   1. Hyperglycemia   2. Nausea and vomiting   3. Right flank pain    37 yo female with right flank pain, hyperglycemia.  Pt does have history of renal stones, but on prior scans has not shown movement.  Pt  does not appear to have renal colic.  She has history of chronic pain.  Glucose has improved from 657 to 455 with ivf and insulin 10 units.  I am unsure if she is giving herself insulin at home.  Will treat pain with toradol.  Will give additional insulin.  No signs of DKA.  4:51 AM Pt has received 3 liters of ns, , 40 units of insulin.  Blood sugar has come down to 256.  No vomiting while she has been here.  Unclear of source of right flank pain, but she has been resting comfortably here during her evaluation    Olivia Mackie, MD 04/06/13 [phone]

## 2013-04-06 NOTE — ED Notes (Signed)
Pt c/o rt flank pain today.  nv .  lmp now

## 2013-04-08 NOTE — ED Provider Notes (Signed)
Medical screening examination/treatment/procedure(s) were conducted as a shared visit with non-physician practitioner(s) and myself.  I personally evaluated the patient during the encounter Pt c/o upper abd pain, hx same pain in past incl eval earlier today, u/s then neg acute. Labs reviewed. Pt appears comfortable, no recurrent nv. Appears stable for d/c w close pcp f/u.   Suzi Roots, MD 04/08/13 [phone]

## 2013-04-11 ENCOUNTER — Encounter (HOSPITAL_COMMUNITY): Payer: Self-pay | Admitting: Emergency Medicine

## 2013-04-11 ENCOUNTER — Emergency Department (HOSPITAL_COMMUNITY): Payer: PRIVATE HEALTH INSURANCE

## 2013-04-11 ENCOUNTER — Emergency Department (HOSPITAL_COMMUNITY)
Admission: EM | Admit: 2013-04-11 | Discharge: 2013-04-12 | Disposition: A | Discharge status: Home / Self Care | Payer: PRIVATE HEALTH INSURANCE | Attending: Emergency Medicine | Admitting: Emergency Medicine

## 2013-04-11 DIAGNOSIS — R300 Dysuria: Secondary | ICD-10-CM | POA: Insufficient documentation

## 2013-04-11 DIAGNOSIS — Z3202 Encounter for pregnancy test, result negative: Secondary | ICD-10-CM | POA: Insufficient documentation

## 2013-04-11 DIAGNOSIS — F17200 Nicotine dependence, unspecified, uncomplicated: Secondary | ICD-10-CM | POA: Insufficient documentation

## 2013-04-11 DIAGNOSIS — R509 Fever, unspecified: Secondary | ICD-10-CM | POA: Insufficient documentation

## 2013-04-11 DIAGNOSIS — Z79899 Other long term (current) drug therapy: Secondary | ICD-10-CM | POA: Insufficient documentation

## 2013-04-11 DIAGNOSIS — E119 Type 2 diabetes mellitus without complications: Secondary | ICD-10-CM | POA: Insufficient documentation

## 2013-04-11 DIAGNOSIS — Z87442 Personal history of urinary calculi: Secondary | ICD-10-CM | POA: Insufficient documentation

## 2013-04-11 DIAGNOSIS — R109 Unspecified abdominal pain: Secondary | ICD-10-CM | POA: Insufficient documentation

## 2013-04-11 HISTORY — DX: Calculus of kidney: N20.0

## 2013-04-11 LAB — URINALYSIS, ROUTINE W REFLEX MICROSCOPIC
Bilirubin Urine: NEGATIVE
Glucose, UA: >1000 mg/dL — AB
Nitrite: NEGATIVE
Specific Gravity, Urine: 1.04 — ABNORMAL HIGH (ref 1.005–1.030)
Urobilinogen, UA: 0.2 mg/dL (ref 0.0–1.0)
pH: 6.5 (ref 5.0–8.0)

## 2013-04-11 LAB — CBC WITH DIFFERENTIAL/PLATELET
Basophils Absolute: 0 10*3/uL (ref 0.0–0.1)
Eosinophils Absolute: 0 10*3/uL (ref 0.0–0.7)
HCT: 34.2 % — ABNORMAL LOW (ref 36.0–46.0)
Lymphocytes Relative: 8 % — ABNORMAL LOW (ref 12–46)
Lymphs Abs: 0.9 10*3/uL (ref 0.7–4.0)
MCH: 18.4 pg — ABNORMAL LOW (ref 26.0–34.0)
MCHC: 30.7 g/dL (ref 30.0–36.0)
Monocytes Absolute: 0.4 10*3/uL (ref 0.1–1.0)
Neutrophils Relative %: 89 % — ABNORMAL HIGH (ref 43–77)
Platelets: 342 10*3/uL (ref 150–400)
RDW: 20.1 % — ABNORMAL HIGH (ref 11.5–15.5)

## 2013-04-11 LAB — COMPREHENSIVE METABOLIC PANEL
ALT: 10 U/L (ref 0–35)
Albumin: 4.2 g/dL (ref 3.5–5.2)
Alkaline Phosphatase: 92 U/L (ref 39–117)
BUN: 8 mg/dL (ref 6–23)
Calcium: 9.8 mg/dL (ref 8.4–10.5)
Chloride: 95 mEq/L — ABNORMAL LOW (ref 96–112)
GFR calc Af Amer: >90 mL/min (ref >90)
Potassium: 3.8 mEq/L (ref 3.5–5.1)
Sodium: 133 mEq/L — ABNORMAL LOW (ref 135–145)
Total Protein: 9 g/dL — ABNORMAL HIGH (ref 6.0–8.3)

## 2013-04-11 LAB — POCT PREGNANCY, URINE: Preg Test, Ur: NEGATIVE

## 2013-04-11 LAB — LIPASE, BLOOD: Lipase: 46 U/L (ref 11–59)

## 2013-04-11 LAB — URINE MICROSCOPIC-ADD ON

## 2013-04-11 MED ORDER — PROMETHAZINE HCL 25 MG RE SUPP: 25.0000 mg | Freq: Four times a day (QID) | RECTAL | Status: DC | PRN

## 2013-04-11 MED ORDER — HYDROMORPHONE HCL PF 1 MG/ML IJ SOLN
1.0000 mg | Freq: Once | INTRAMUSCULAR | Status: AC
  Administered 2013-04-11: 1 mg via INTRAVENOUS
  Filled 2013-04-11: qty 1

## 2013-04-11 MED ORDER — PROMETHAZINE HCL 25 MG/ML IJ SOLN
12.5000 mg | Freq: Once | INTRAMUSCULAR | Status: AC
  Administered 2013-04-11: 12.5 mg via INTRAVENOUS
  Filled 2013-04-11: qty 1

## 2013-04-11 MED ORDER — GLIPIZIDE 5 MG PO TABS: 5.0000 mg | ORAL_TABLET | Freq: Two times a day (BID) | ORAL | Status: DC

## 2013-04-11 MED ORDER — HYDROCODONE-ACETAMINOPHEN 5-325 MG PO TABS: 2.0000 | ORAL_TABLET | ORAL | Status: DC | PRN

## 2013-04-11 MED ORDER — SODIUM CHLORIDE 0.9 % IV BOLUS (SEPSIS)
1000.0000 mL | Freq: Once | INTRAVENOUS | Status: AC
  Administered 2013-04-11: 1000 mL via INTRAVENOUS

## 2013-04-11 MED ORDER — PROMETHAZINE HCL 25 MG/ML IJ SOLN
12.5000 mg | Freq: Once | INTRAMUSCULAR | Status: AC
  Administered 2013-04-12: 12.5 mg via INTRAVENOUS
  Filled 2013-04-11: qty 1

## 2013-04-11 NOTE — ED Notes (Signed)
Per EMS pt has c/o nausea and vomiting. Per pt emesis was food particles and water.

## 2013-04-11 NOTE — ED Provider Notes (Signed)
CSN: [id number]     Arrival date & time 04/11/13  1920 History   First MD Initiated Contact with Patient 04/11/13 1936     Chief Complaint  Patient presents with  . Nausea   (Consider location/radiation/quality/duration/timing/severity/associated sxs/prior Treatment) HPI Comments: Patient presents with nausea and vomiting associated right flank pain. She has a history kidney stones and states this is similar  to past episodes of kidney stone she. She had a sudden onset of pain with associated vomiting earlier this morning and it's been intermittent throughout the day. She states her emesis has been yellow stomach contents. She is one point had a small amount of blood mixed in. She's had no ongoing hematemesis. She currently does not have or has never seen a urologist in the past for kidney stones. She does say that she had a fever earlier in the day but doesn't know what it was. She does have some burning on urination which has been gone on for the last 2-3 days.   Past Medical History  Diagnosis Date  . Kidney calculi    Past Surgical History  Procedure Laterality Date  . Cesarean section     No family history on file. History  Substance Use Topics  . Smoking status: Current Every Day Smoker  . Smokeless tobacco: Not on file  . Alcohol Use: No   OB History   Grav Para Term Preterm Abortions TAB SAB Ect Mult Living                 Review of Systems  Constitutional: Positive for fever. Negative for chills, diaphoresis and fatigue.  HENT: Negative for congestion, rhinorrhea and sneezing.   Eyes: Negative.   Respiratory: Negative for cough, chest tightness and shortness of breath.   Cardiovascular: Negative for chest pain and leg swelling.  Gastrointestinal: Positive for nausea, vomiting and abdominal pain. Negative for diarrhea and blood in stool.  Genitourinary: Positive for dysuria and flank pain. Negative for frequency, hematuria and difficulty urinating.  Musculoskeletal:  Negative for arthralgias and back pain.  Skin: Negative for rash.  Neurological: Negative for dizziness, speech difficulty, weakness, numbness and headaches.    Allergies  Reglan and Zofran  Home Medications   Current Outpatient Rx  Name  Route  Sig  Dispense  Refill  . glipiZIDE (GLUCOTROL) 5 MG tablet   Oral   Take 1 tablet (5 mg total) by mouth 2 (two) times daily before a meal.   30 tablet   0   . HYDROcodone-acetaminophen (NORCO/VICODIN) 5-325 MG per tablet   Oral   Take 2 tablets by mouth every 4 (four) hours as needed for pain.   15 tablet   0   . promethazine (PHENERGAN) 25 MG suppository   Rectal   Place 1 suppository (25 mg total) rectally every 6 (six) hours as needed for nausea.   12 each   0    BP 174/73  Pulse 84  Temp(Src) 98.6 F (37 C) (Oral)  Resp 20  SpO2 100%  LMP 04/10/2013 Physical Exam  Constitutional: She is oriented to person, place, and time. She appears well-developed and well-nourished.  HENT:  Head: Normocephalic and atraumatic.  Mouth/Throat: Oropharynx is clear and moist.  Eyes: Pupils are equal, round, and reactive to light.  Neck: Normal range of motion. Neck supple.  Cardiovascular: Normal rate, regular rhythm and normal heart sounds.   Pulmonary/Chest: Effort normal and breath sounds normal. No respiratory distress. She has no wheezes. She has no rales.  She exhibits no tenderness.  Abdominal: Soft. Bowel sounds are normal. There is tenderness (Mild tenderness the right flank). There is no rebound and no guarding.  Musculoskeletal: Normal range of motion. She exhibits no edema.  Lymphadenopathy:    She has no cervical adenopathy.  Neurological: She is alert and oriented to person, place, and time.  Skin: Skin is warm and dry. No rash noted.  Psychiatric: She has a normal mood and affect.    ED Course  Procedures (including critical care time) Labs Review Results for orders placed during the hospital encounter of 04/11/13   CBC WITH DIFFERENTIAL      Result Value Range   WBC 11.8 (*) 4.0 - 10.5 K/uL   RBC 5.70 (*) 3.87 - 5.11 MIL/uL   Hemoglobin 10.5 (*) 12.0 - 15.0 g/dL   HCT 16.1 (*) 09.6 - 04.5 %   MCV 60.0 (*) 78.0 - 100.0 fL   MCH 18.4 (*) 26.0 - 34.0 pg   MCHC 30.7  30.0 - 36.0 g/dL   RDW 40.9 (*) 81.1 - 91.4 %   Platelets 342  150 - 400 K/uL   Neutrophils Relative % 89 (*) 43 - 77 %   Lymphocytes Relative 8 (*) 12 - 46 %   Monocytes Relative 3  3 - 12 %   Eosinophils Relative 0  0 - 5 %   Basophils Relative 0  0 - 1 %   Neutro Abs 10.5 (*) 1.7 - 7.7 K/uL   Lymphs Abs 0.9  0.7 - 4.0 K/uL   Monocytes Absolute 0.4  0.1 - 1.0 K/uL   Eosinophils Absolute 0.0  0.0 - 0.7 K/uL   Basophils Absolute 0.0  0.0 - 0.1 K/uL   RBC Morphology TEARDROP CELLS    COMPREHENSIVE METABOLIC PANEL      Result Value Range   Sodium 133 (*) 135 - 145 mEq/L   Potassium 3.8  3.5 - 5.1 mEq/L   Chloride 95 (*) 96 - 112 mEq/L   CO2 23  19 - 32 mEq/L   Glucose, Bld 393 (*) 70 - 99 mg/dL   BUN 8  6 - 23 mg/dL   Creatinine, Ser 7.82 (*) 0.50 - 1.10 mg/dL   Calcium 9.8  8.4 - 95.6 mg/dL   Total Protein 9.0 (*) 6.0 - 8.3 g/dL   Albumin 4.2  3.5 - 5.2 g/dL   AST 14  0 - 37 U/L   ALT 10  0 - 35 U/L   Alkaline Phosphatase 92  39 - 117 U/L   Total Bilirubin 0.5  0.3 - 1.2 mg/dL   GFR calc non Af Amer >90  >90 mL/min   GFR calc Af Amer >90  >90 mL/min  LIPASE, BLOOD      Result Value Range   Lipase 46  11 - 59 U/L  URINALYSIS, ROUTINE W REFLEX MICROSCOPIC      Result Value Range   Color, Urine YELLOW  YELLOW   APPearance CLEAR  CLEAR   Specific Gravity, Urine 1.040 (*) 1.005 - 1.030   pH 6.5  5.0 - 8.0   Glucose, UA >1000 (*) NEGATIVE mg/dL   Hgb urine dipstick TRACE (*) NEGATIVE   Bilirubin Urine NEGATIVE  NEGATIVE   Ketones, ur >80 (*) NEGATIVE mg/dL   Protein, ur 213 (*) NEGATIVE mg/dL   Urobilinogen, UA 0.2  0.0 - 1.0 mg/dL   Nitrite NEGATIVE  NEGATIVE   Leukocytes, UA NEGATIVE  NEGATIVE  URINE  MICROSCOPIC-ADD ON  Result Value Range   Squamous Epithelial / LPF FEW (*) RARE   RBC / HPF 0-2  <3 RBC/hpf  POCT PREGNANCY, URINE      Result Value Range   Preg Test, Ur NEGATIVE  NEGATIVE   Ct Abdomen Pelvis Wo Contrast  04/11/2013   CLINICAL DATA:  Right flank pain, vomiting.  EXAM: CT ABDOMEN AND PELVIS WITHOUT CONTRAST  TECHNIQUE: Multidetector CT imaging of the abdomen and pelvis was performed following the standard protocol without intravenous contrast.  COMPARISON:  None.  FINDINGS: Lung bases are clear. No effusions. Heart is normal size.  Prior cholecystectomy. Liver, spleen, pancreas, adrenals and left kidney are unremarkable. 8 mm nonobstructing right renal midpole stone. No hydronephrosis. No ureteral stones.  Urinary bladder, uterus and adnexa have an unremarkable unenhanced appearance. Appendix is visualized and is normal. Bowel grossly unremarkable. No free fluid, free air, or adenopathy. Aorta is normal caliber.  Lung bases are clear. No effusions. Heart is normal size. No acute bony abnormality.  IMPRESSION: Right nephrolithiasis. No ureteral stones or hydronephrosis.  Normal appendix.   Electronically Signed   By: Charlett Nose M.D.   On: 04/11/2013 21:10    Imaging Review Ct Abdomen Pelvis Wo Contrast  04/11/2013   CLINICAL DATA:  Right flank pain, vomiting.  EXAM: CT ABDOMEN AND PELVIS WITHOUT CONTRAST  TECHNIQUE: Multidetector CT imaging of the abdomen and pelvis was performed following the standard protocol without intravenous contrast.  COMPARISON:  None.  FINDINGS: Lung bases are clear. No effusions. Heart is normal size.  Prior cholecystectomy. Liver, spleen, pancreas, adrenals and left kidney are unremarkable. 8 mm nonobstructing right renal midpole stone. No hydronephrosis. No ureteral stones.  Urinary bladder, uterus and adnexa have an unremarkable unenhanced appearance. Appendix is visualized and is normal. Bowel grossly unremarkable. No free fluid, free air, or  adenopathy. Aorta is normal caliber.  Lung bases are clear. No effusions. Heart is normal size. No acute bony abnormality.  IMPRESSION: Right nephrolithiasis. No ureteral stones or hydronephrosis.  Normal appendix.   Electronically Signed   By: Charlett Nose M.D.   On: 04/11/2013 21:10    EKG Interpretation   None       MDM   1. Abdominal  pain, other specified site   2. DM (diabetes mellitus)    Patient presents with right flank pain. She has a history of kidney stones and says it feels similar although there is no ureteral stones on CT. There is no signs of obstruction. There is no evidence of UTI. She was noted to be hyperglycemic but denies any known history of diabetes. She had a large amount of ketones in the urine but she was not acidotic. She does have some anemia on the blood work with teardrop cells which will need outpatient followup. I discussed these findings with her as well as the importance of followup for diabetes management. I did start her on glipizide and encouraged her to have close followup and I did refer to the [facility]. She was given Dilaudid and Phenergan for pain. She doesn't have any significant findings on repeat abdominal exam. Her appendix was normal in CT. She doesn't have any pelvic pain which begins her name for ovarian issues. She's had her gallbladder removed. Given this I felt she can be discharged home but I did encourage her to have close outpatient followup.    Rolan Bucco, MD 04/11/13 [phone]

## 2013-04-11 NOTE — ED Notes (Signed)
Bed: ZO10 Expected date: 04/11/13 Expected time: 7:15 PM Means of arrival:  Comments: EMS [age] F Gen weakness N/V, RLQ pain

## 2013-04-11 NOTE — ED Notes (Signed)
Pt is actively vomiting green bile and c/o 10/10 pain in RLQ of abdomen.

## 2013-04-12 ENCOUNTER — Encounter (HOSPITAL_COMMUNITY): Payer: Self-pay | Admitting: Emergency Medicine

## 2013-04-12 ENCOUNTER — Observation Stay (HOSPITAL_COMMUNITY)
Admission: EM | Admit: 2013-04-12 | Discharge: 2013-04-13 | Disposition: A | Discharge status: Home / Self Care | Payer: PRIVATE HEALTH INSURANCE | Attending: Internal Medicine | Admitting: Internal Medicine

## 2013-04-12 DIAGNOSIS — E119 Type 2 diabetes mellitus without complications: Secondary | ICD-10-CM | POA: Diagnosis present

## 2013-04-12 DIAGNOSIS — Z79899 Other long term (current) drug therapy: Secondary | ICD-10-CM | POA: Insufficient documentation

## 2013-04-12 DIAGNOSIS — R112 Nausea with vomiting, unspecified: Secondary | ICD-10-CM | POA: Insufficient documentation

## 2013-04-12 DIAGNOSIS — R109 Unspecified abdominal pain: Principal | ICD-10-CM | POA: Insufficient documentation

## 2013-04-12 DIAGNOSIS — R197 Diarrhea, unspecified: Secondary | ICD-10-CM | POA: Diagnosis present

## 2013-04-12 DIAGNOSIS — Z87442 Personal history of urinary calculi: Secondary | ICD-10-CM | POA: Insufficient documentation

## 2013-04-12 LAB — POCT I-STAT, CHEM 8
Calcium, Ion: 1.11 mmol/L — ABNORMAL LOW (ref 1.12–1.23)
Creatinine, Ser: 0.6 mg/dL (ref 0.50–1.10)
Glucose, Bld: 379 mg/dL — ABNORMAL HIGH (ref 70–99)
HCT: 36 % (ref 36.0–46.0)
Hemoglobin: 12.2 g/dL (ref 12.0–15.0)
Potassium: 3.8 mEq/L (ref 3.5–5.1)
Sodium: 137 mEq/L (ref 135–145)
TCO2: 22 mmol/L (ref 0–100)

## 2013-04-12 LAB — GLUCOSE, CAPILLARY
Glucose-Capillary: 318 mg/dL — ABNORMAL HIGH (ref 70–99)
Glucose-Capillary: 320 mg/dL — ABNORMAL HIGH (ref 70–99)

## 2013-04-12 LAB — HEMOGLOBIN A1C
Hgb A1c MFr Bld: 9.9 % — ABNORMAL HIGH (ref <5.7)
Mean Plasma Glucose: 237 mg/dL — ABNORMAL HIGH (ref <117)

## 2013-04-12 LAB — URINALYSIS, ROUTINE W REFLEX MICROSCOPIC
Bilirubin Urine: NEGATIVE
Hgb urine dipstick: NEGATIVE
Ketones, ur: >80 mg/dL — AB
Leukocytes, UA: NEGATIVE
Nitrite: NEGATIVE
Protein, ur: 100 mg/dL — AB
Specific Gravity, Urine: >1.046 — ABNORMAL HIGH (ref 1.005–1.030)
Urobilinogen, UA: 0.2 mg/dL (ref 0.0–1.0)

## 2013-04-12 LAB — URINE MICROSCOPIC-ADD ON

## 2013-04-12 MED ORDER — HYDROMORPHONE HCL PF 1 MG/ML IJ SOLN: 1.0000 mg | INTRAMUSCULAR | Status: DC | PRN

## 2013-04-12 MED ORDER — ENOXAPARIN SODIUM 40 MG/0.4ML ~~LOC~~ SOLN
40.0000 mg | SUBCUTANEOUS | Status: DC
  Filled 2013-04-12 (×2): qty 0.4

## 2013-04-12 MED ORDER — HYDROMORPHONE HCL PF 1 MG/ML IJ SOLN
0.5000 mg | Freq: Once | INTRAMUSCULAR | Status: AC
  Administered 2013-04-12: 0.5 mg via INTRAVENOUS
  Filled 2013-04-12: qty 1

## 2013-04-12 MED ORDER — PROMETHAZINE HCL 25 MG/ML IJ SOLN
25.0000 mg | Freq: Once | INTRAMUSCULAR | Status: AC
  Administered 2013-04-12: 25 mg via INTRAVENOUS
  Filled 2013-04-12: qty 1

## 2013-04-12 MED ORDER — INSULIN ASPART 100 UNIT/ML ~~LOC~~ SOLN
0.0000 [IU] | Freq: Three times a day (TID) | SUBCUTANEOUS | Status: DC
  Administered 2013-04-12: 11 [IU] via SUBCUTANEOUS
  Administered 2013-04-13: 8 [IU] via SUBCUTANEOUS

## 2013-04-12 MED ORDER — INSULIN ASPART 100 UNIT/ML ~~LOC~~ SOLN
10.0000 [IU] | Freq: Once | SUBCUTANEOUS | Status: AC
  Administered 2013-04-12: 10 [IU] via SUBCUTANEOUS
  Filled 2013-04-12: qty 1

## 2013-04-12 MED ORDER — ACETAMINOPHEN 325 MG PO TABS: 650.0000 mg | ORAL_TABLET | Freq: Four times a day (QID) | ORAL | Status: DC | PRN

## 2013-04-12 MED ORDER — ACETAMINOPHEN 650 MG RE SUPP: 650.0000 mg | Freq: Four times a day (QID) | RECTAL | Status: DC | PRN

## 2013-04-12 MED ORDER — HYDROMORPHONE HCL PF 1 MG/ML IJ SOLN
1.0000 mg | Freq: Once | INTRAMUSCULAR | Status: AC
  Administered 2013-04-12: 1 mg via INTRAVENOUS
  Filled 2013-04-12: qty 1

## 2013-04-12 MED ORDER — PANTOPRAZOLE SODIUM 40 MG IV SOLR
40.0000 mg | Freq: Two times a day (BID) | INTRAVENOUS | Status: DC
  Administered 2013-04-12: 40 mg via INTRAVENOUS
  Filled 2013-04-12 (×3): qty 40

## 2013-04-12 MED ORDER — INFLUENZA VAC SPLIT QUAD 0.5 ML IM SUSP
0.5000 mL | INTRAMUSCULAR | Status: DC
  Filled 2013-04-12 (×2): qty 0.5

## 2013-04-12 MED ORDER — PNEUMOCOCCAL VAC POLYVALENT 25 MCG/0.5ML IJ INJ
0.5000 mL | INJECTION | INTRAMUSCULAR | Status: DC
  Filled 2013-04-12 (×2): qty 0.5

## 2013-04-12 MED ORDER — SODIUM CHLORIDE 0.9 % IV SOLN
INTRAVENOUS | Status: DC
  Administered 2013-04-12 – 2013-04-13 (×3): via INTRAVENOUS

## 2013-04-12 MED ORDER — HYDROCODONE-ACETAMINOPHEN 5-325 MG PO TABS
2.0000 | ORAL_TABLET | Freq: Four times a day (QID) | ORAL | Status: DC | PRN
  Filled 2013-04-12: qty 2

## 2013-04-12 MED ORDER — PROMETHAZINE HCL 25 MG/ML IJ SOLN
12.5000 mg | INTRAMUSCULAR | Status: DC | PRN
  Administered 2013-04-12 – 2013-04-13 (×5): 25 mg via INTRAVENOUS
  Filled 2013-04-12 (×5): qty 1

## 2013-04-12 MED ORDER — GLIPIZIDE 5 MG PO TABS
5.0000 mg | ORAL_TABLET | Freq: Two times a day (BID) | ORAL | Status: DC
  Administered 2013-04-13: 5 mg via ORAL
  Filled 2013-04-12 (×4): qty 1

## 2013-04-12 MED ORDER — INSULIN ASPART 100 UNIT/ML ~~LOC~~ SOLN
0.0000 [IU] | Freq: Every day | SUBCUTANEOUS | Status: DC
  Administered 2013-04-12: 2 [IU] via SUBCUTANEOUS

## 2013-04-12 MED ORDER — MORPHINE SULFATE 2 MG/ML IJ SOLN
1.0000 mg | INTRAMUSCULAR | Status: DC | PRN
  Administered 2013-04-12: 2 mg via INTRAVENOUS
  Filled 2013-04-12: qty 1

## 2013-04-12 MED ORDER — SODIUM CHLORIDE 0.9 % IV BOLUS (SEPSIS)
1000.0000 mL | Freq: Once | INTRAVENOUS | Status: AC
  Administered 2013-04-12: 1000 mL via INTRAVENOUS

## 2013-04-12 MED ORDER — HYDROMORPHONE HCL PF 1 MG/ML IJ SOLN
0.5000 mg | INTRAMUSCULAR | Status: DC | PRN
  Administered 2013-04-12 – 2013-04-13 (×6): 1 mg via INTRAVENOUS
  Filled 2013-04-12 (×6): qty 1

## 2013-04-12 NOTE — ED Notes (Signed)
Pt reports feeling nauseous.

## 2013-04-12 NOTE — Progress Notes (Signed)
ED first called at 1315 for report , able to get report at 1327, patient got to the floor at 1419.

## 2013-04-12 NOTE — ED Notes (Signed)
Patient is resting comfortably.  MD at bedside.

## 2013-04-12 NOTE — ED Notes (Signed)
Pt presents to the Ed with a complaint of flank pain that is causing her nausea.  Pt was seen here for same earlier in the evening , filled her prescription but has had no relief of symptoms.

## 2013-04-12 NOTE — ED Provider Notes (Signed)
CSN: [id number]     Arrival date & time 04/12/13  0450 History   None    Chief Complaint  Patient presents with  . Flank Pain  . Nausea    HPI  [name] is a 37 y.o. female with a PMH of kidney stones who presents to the ED for evaluation of flank pain and nausea.  History was provided by the patient.  Patient was seen in the emergency department yesterday on 04/11/13 for nausea and right flank pain. She states that she was diagnosed with diabetes and started on glipizide.  She was given Norco and Phenergen for symptomatic relief. She states that she went home and has been continuously vomiting. She has been vomiting every 30 minutes. Emesis is yellow. He had one episode of vomiting which had a few streaks of blood in it however denies gross hematemesis. Patient states that the Norco is not relieving her pain. She states she took for Norco and 2 Phenergan suppositories since discharge. Patient states that her abdominal pain is unchanged from last night. It is located in the right flank and is described as a sharp sensation. She also complains of some minimal epigastric pain which is worse with vomiting. She denies any other acute changes since last night. She has not been able to keep anything down.  No fevers, rhinorrhea, congestion, sore throat, chest pain, SOB, hematuria, vaginal bleeding, vaginal discharge, headache, dizziness, diarrhea, constipation or lightheadedness. She denies dysuria but complained of this last night in the ED.     Past Medical History  Diagnosis Date  . Kidney calculi    Past Surgical History  Procedure Laterality Date  . Cesarean section     History reviewed. No pertinent family history. History  Substance Use Topics  . Smoking status: Current Every Day Smoker  . Smokeless tobacco: Not on file  . Alcohol Use: No   OB History   Grav Para Term Preterm Abortions TAB SAB Ect Mult Living                 Review of Systems  Constitutional: Positive for  appetite change. Negative for fever, chills, diaphoresis, activity change and fatigue.  HENT: Negative for congestion, rhinorrhea and sore throat.   Eyes: Negative for visual disturbance.  Respiratory: Negative for cough, shortness of breath and wheezing.   Cardiovascular: Negative for chest pain and leg swelling.  Gastrointestinal: Positive for nausea, vomiting and abdominal pain. Negative for diarrhea, constipation, blood in stool, abdominal distention, anal bleeding and rectal pain.  Genitourinary: Positive for dysuria and flank pain. Negative for hematuria, decreased urine volume, vaginal bleeding, vaginal discharge and vaginal pain.  Musculoskeletal: Negative for back pain.  Skin: Negative for wound.  Neurological: Negative for dizziness, syncope, weakness, light-headedness, numbness and headaches.    Allergies  Reglan and Zofran  Home Medications   Current Outpatient Rx  Name  Route  Sig  Dispense  Refill  . glipiZIDE (GLUCOTROL) 5 MG tablet   Oral   Take 1 tablet (5 mg total) by mouth 2 (two) times daily before a meal.   30 tablet   0   . HYDROcodone-acetaminophen (NORCO/VICODIN) 5-325 MG per tablet   Oral   Take 2 tablets by mouth every 4 (four) hours as needed for pain.   15 tablet   0   . promethazine (PHENERGAN) 25 MG suppository   Rectal   Place 1 suppository (25 mg total) rectally every 6 (six) hours as needed for nausea.  12 each   0    BP 172/79  Pulse 99  Temp(Src) 99.4 F (37.4 C) (Oral)  Resp 19  Ht 5\' 6"  (1.676 m)  Wt 200 lb (90.719 kg)  BMI 32.3 kg/m2  SpO2 98%  LMP 04/10/2013  Filed Vitals:   04/12/13 1016 04/12/13 1420 04/12/13 2033 04/13/13 0545  BP: 157/73 165/75 149/61 137/77  Pulse: 97 96 86 95  Temp:  98.2 F (36.8 C) 99.8 F (37.7 C) 99.3 F (37.4 C)  TempSrc:  Oral Oral Oral  Resp: 18 18 18 18   Height:  5\' 6"  (1.676 m)    Weight:  202 lb 9.6 oz (91.899 kg)    SpO2: 97% 100% 100% 100%    Physical Exam  Nursing note and  vitals reviewed. Constitutional: She is oriented to person, place, and time. She appears well-developed and well-nourished. No distress.  HENT:  Head: Normocephalic and atraumatic.  Right Ear: External ear normal.  Left Ear: External ear normal.  Nose: Nose normal.  Mouth/Throat: Oropharynx is clear and moist. No oropharyngeal exudate.  Eyes: Conjunctivae are normal. Pupils are equal, round, and reactive to light. Right eye exhibits no discharge. Left eye exhibits no discharge.  Neck: Normal range of motion. Neck supple.  Cardiovascular: Normal rate, regular rhythm, normal heart sounds and intact distal pulses.  Exam reveals no gallop and no friction rub.   No murmur heard. Dorsalis pedis pulses present bilaterally  Pulmonary/Chest: Effort normal and breath sounds normal. No respiratory distress. She has no wheezes. She has no rales. She exhibits no tenderness.  Abdominal: Soft. Bowel sounds are normal. She exhibits no distension and no mass. There is tenderness. There is no rebound and no guarding.  Diffuse tenderness to palpation throughout  Musculoskeletal: Normal range of motion. She exhibits no edema and no tenderness.  No lumbar or CVA tenderness bilaterally  Neurological: She is alert and oriented to person, place, and time.  Skin: Skin is warm and dry. She is not diaphoretic.    ED Course  Procedures (including critical care time) Labs Review Labs Reviewed - No data to display Imaging Review   Ct Abdomen Pelvis Wo Contrast 04/11/2013   CLINICAL DATA:  Right flank pain, vomiting.  EXAM: CT ABDOMEN AND PELVIS WITHOUT CONTRAST  TECHNIQUE: Multidetector CT imaging of the abdomen and pelvis was performed following the standard protocol without intravenous contrast.  COMPARISON:  None.  FINDINGS: Lung bases are clear. No effusions. Heart is normal size.  Prior cholecystectomy. Liver, spleen, pancreas, adrenals and left kidney are unremarkable. 8 mm nonobstructing right renal midpole  stone. No hydronephrosis. No ureteral stones.  Urinary bladder, uterus and adnexa have an unremarkable unenhanced appearance. Appendix is visualized and is normal. Bowel grossly unremarkable. No free fluid, free air, or adenopathy. Aorta is normal caliber.  Lung bases are clear. No effusions. Heart is normal size. No acute bony abnormality.  IMPRESSION: Right nephrolithiasis. No ureteral stones or hydronephrosis.  Normal appendix.   Electronically Signed   By: Charlett Nose M.D.   On: 04/11/2013 21:10    EKG Interpretation   None      Results for orders placed during the hospital encounter of 04/12/13  URINALYSIS, ROUTINE W REFLEX MICROSCOPIC      Result Value Range   Color, Urine YELLOW  YELLOW   APPearance CLEAR  CLEAR   Specific Gravity, Urine >1.046 (*) 1.005 - 1.030   pH 6.5  5.0 - 8.0   Glucose, UA >1000 (*) NEGATIVE mg/dL  Hgb urine dipstick NEGATIVE  NEGATIVE   Bilirubin Urine NEGATIVE  NEGATIVE   Ketones, ur >80 (*) NEGATIVE mg/dL   Protein, ur 191 (*) NEGATIVE mg/dL   Urobilinogen, UA 0.2  0.0 - 1.0 mg/dL   Nitrite NEGATIVE  NEGATIVE   Leukocytes, UA NEGATIVE  NEGATIVE  URINE MICROSCOPIC-ADD ON      Result Value Range   Squamous Epithelial / LPF RARE  RARE   WBC, UA 0-2  <3 WBC/hpf   RBC / HPF 0-2  <3 RBC/hpf   Urine-Other RARE YEAST    GLUCOSE, CAPILLARY      Result Value Range   Glucose-Capillary 318 (*) 70 - 99 mg/dL  POCT I-STAT, CHEM 8      Result Value Range   Sodium 137  135 - 145 mEq/L   Potassium 3.8  3.5 - 5.1 mEq/L   Chloride 100  96 - 112 mEq/L   BUN 8  6 - 23 mg/dL   Creatinine, Ser 4.78  0.50 - 1.10 mg/dL   Glucose, Bld 295 (*) 70 - 99 mg/dL   Calcium, Ion 6.21 (*) 1.12 - 1.23 mmol/L   TCO2 22  0 - 100 mmol/L   Hemoglobin 12.2  12.0 - 15.0 g/dL   HCT 30.8  65.7 - 84.6 %    MDM   1. Abdominal pain   2. Nausea and vomiting   3. Abdominal  pain, other specified site   4. Diabetes mellitus, new onset     [name] is a 37 y.o. female with a  PMH of kidney stones who presents to the ED for evaluation of flank pain and nausea.  I stat 8, UA, POC glucose, ordered to further evaluate.  1L fluids. Phenergan and 0.5 dilaudid ordered.    Rechecks  8:15 AM = patient states that her nausea has improved. She states that her pain has had only mild improvement. Another 0.5 mg Dilaudid ordered 9:00 AM = patient states she feels much better. Resting comfortably in bed. 9:30 AM = Another liter of IV fluids ordered.  10 units insulin ordered.   11:30 AM = Pain and nausea returned.  Blood sugar 318 despite being given insulin (10:30 AM).  1 mg dilaudid and phenergan ordered.  Patient states she vomited.     12:25 AM = Patient states pain improved after dilaudid but nausea returned.    Consults  12:50 AM = Spoke with Dr. Gloris Manchester who will admit.    Patient will be admitted for further pain management of her abdominal pain and nausea.  Pain and nausea not controlled throughout her ED visit.  She had a CT of the abdomen/pelvis yesterday which showed a right nephrolithiasis with no obstruction or ureteral stone.  She was diagnosed with diabetes yesterday in the ED and started on Glipizide.  She returned to the ED today for continued pain and vomiting.  She received insulin in the ED however he BS was not controlled.  Patient in agreement with admission and plan.     Final impressions: 1. Abdominal pain  2. Nausea and vomiting  3. Diabetes mellitus, new onset     Luiz Iron PA-C   This patient was discussed with Dr. Nigel Mormon, PA-C 04/14/13 1354

## 2013-04-12 NOTE — ED Notes (Signed)
Pt arousable and oriented x 4. C/o abdominal pain. Admits to vomiting several times since previous d/c from ED yesterday.

## 2013-04-12 NOTE — H&P (Addendum)
[facility] History and Physical  [name] ZOX:[id number] DOB: Jun 20, 1976 DOA: 04/12/2013  Referring physician: PCP: Default, Provider, MD  Specialists: none  Chief Complaint: Right-sided abdominal/flank pain  HPI: [name] is a 37 y.o. female with history of kidney stones, and new onset diabetes diagnosed yesterday 10/18 in ED, who presents with above complaints for the past 3-4 days. She describes the pain as severe and associated with no nausea or vomiting, nonbloody. She also admits to diarrhea greater than 5 episodes a day, nonbloody for the same duration. She admits to fevers She was seen in the ED yesterday 10/18 and CT scan of the abdomen and pelvis showed right nephrolithiasis(66mm non-obstructing stone) no ureteral stones or hydronephrosis. Normal appendix. Her urinalysis was unremarkable for infection and lipase done 10/18 was also within normal limits. Blood glucose was elevated at 379, as noted she was diagnosed with diabetes when she was first seen in the ED yesterday and discharged home on glipizide. She was given a dose of IV insulin in ED ,and continued to be nauseous with increased pain after antiemetics and IV Dilaudid in the ED so she is admitted for further evaluation and management.   Review of Systems: The patient denies anorexia, fever, weight loss,, vision loss, decreased hearing, hoarseness, chest pain, syncope, dyspnea on exertion, peripheral edema, balance deficits, hemoptysis, melena, hematochezia, hematuria, incontinence, transient blindness, difficulty walking, depression, unusual weight change.  Past Medical History  Diagnosis Date  . Kidney calculi    Past Surgical History  Procedure Laterality Date  . Cesarean section     Social History:  reports that she has been smoking.  She does not have any smokeless tobacco history on file. She reports that she does not drink alcohol. Her drug history is not on file. where does patient live--home Can patient  participate in ADLs  Family history-positive for diabetes and hypertension  Allergies  Allergen Reactions  . Reglan [Metoclopramide] Hives  . Zofran [Ondansetron Hcl] Hives      Prior to Admission medications   Medication Sig Start Date End Date Taking? Authorizing Provider  glipiZIDE (GLUCOTROL) 5 MG tablet Take 1 tablet (5 mg total) by mouth 2 (two) times daily before a meal. 04/11/13  Yes Rolan Bucco, MD  HYDROcodone-acetaminophen (NORCO/VICODIN) 5-325 MG per tablet Take 2 tablets by mouth every 4 (four) hours as needed for pain. 04/11/13  Yes Rolan Bucco, MD  promethazine (PHENERGAN) 25 MG suppository Place 1 suppository (25 mg total) rectally every 6 (six) hours as needed for nausea. 04/11/13  Yes Rolan Bucco, MD   Physical Exam: Filed Vitals:   04/12/13 1016  BP: 157/73  Pulse: 97  Temp:   Resp: 18    Constitutional: Vital signs reviewed.  Patient is a well-developed and well-nourished in no acute distress and cooperative with exam. Alert and oriented x3.  Head: Normocephalic and atraumatic Mouth: no erythema or exudates, slightly dry MM Eyes: PERRL, EOMI, conjunctivae normal, No scleral icterus.  Neck: Supple, Trachea midline normal ROM, No JVD, mass, thyromegaly, or carotid bruit present.  Cardiovascular: RRR, S1 normal, S2 normal, no MRG, pulses symmetric and intact bilaterally Pulmonary/Chest: normal respiratory effort, CTAB, no wheezes, rales, or rhonchi Abdominal: Soft. Mild Right lower quadrant tenderness to flank area, and epigastric tenderness is present. No rebound tenderness non-distended, bowel sounds are normal, no masses, organomegaly, or guarding present.  GU: no CVA tenderness Musculoskeletal: No joint deformities, erythema, or stiffness, ROM full and no nontender Hematology: no cervical, inginal, or axillary adenopathy.  Neurological: A&O x3, Strength is normal and symmetric bilaterally, cranial nerve II-XII are grossly intact, no focal motor  deficit, sensory intact to light touch bilaterally.  Skin: Warm, dry and intact. No rash  Psychiatric: Normal mood and affect. speech and behavior is normal. Judgment and thought content normal. Cognition and memory are normal.    Labs on Admission:  Basic Metabolic Panel:  Recent Labs Lab 04/11/13 2020 04/12/13 0820  NA 133* 137  K 3.8 3.8  CL 95* 100  CO2 23  --   GLUCOSE 393* 379*  BUN 8 8  CREATININE 0.36* 0.60  CALCIUM 9.8  --    Liver Function Tests:  Recent Labs Lab 04/11/13 2020  AST 14  ALT 10  ALKPHOS 92  BILITOT 0.5  PROT 9.0*  ALBUMIN 4.2    Recent Labs Lab 04/11/13 2020  LIPASE 46   No results found for this basename: AMMONIA,  in the last 168 hours CBC:  Recent Labs Lab 04/11/13 2020 04/12/13 0820  WBC 11.8*  --   NEUTROABS 10.5*  --   HGB 10.5* 12.2  HCT 34.2* 36.0  MCV 60.0*  --   PLT 342  --    Cardiac Enzymes: No results found for this basename: CKTOTAL, CKMB, CKMBINDEX, TROPONINI,  in the last 168 hours  BNP (last 3 results) No results found for this basename: PROBNP,  in the last 8760 hours CBG:  Recent Labs Lab 04/12/13 1138  GLUCAP 318*    Radiological Exams on Admission: Ct Abdomen Pelvis Wo Contrast  04/11/2013   CLINICAL DATA:  Right flank pain, vomiting.  EXAM: CT ABDOMEN AND PELVIS WITHOUT CONTRAST  TECHNIQUE: Multidetector CT imaging of the abdomen and pelvis was performed following the standard protocol without intravenous contrast.  COMPARISON:  None.  FINDINGS: Lung bases are clear. No effusions. Heart is normal size.  Prior cholecystectomy. Liver, spleen, pancreas, adrenals and left kidney are unremarkable. 8 mm nonobstructing right renal midpole stone. No hydronephrosis. No ureteral stones.  Urinary bladder, uterus and adnexa have an unremarkable unenhanced appearance. Appendix is visualized and is normal. Bowel grossly unremarkable. No free fluid, free air, or adenopathy. Aorta is normal caliber.  Lung bases are  clear. No effusions. Heart is normal size. No acute bony abnormality.  IMPRESSION: Right nephrolithiasis. No ureteral stones or hydronephrosis.  Normal appendix.   Electronically Signed   By: Charlett Nose M.D.   On: 04/11/2013 21:10      Assessment/Plan Active Problems:   Abdominal  pain, other specified site- with N/V/D -As discussed above, CT scan is normal appendix, right kidney stone is nonobstructive and urinalysis negative for infection. Lipase was within normal limits. -Possibly secondary to gastroenteritis -Will obtain C. difficile and stool studies -We'll also obtain chest x-ray to further evaluate and follow. -Will hydrate with IV fluids, pain management antiemetics supportive care follow and further manage accordingly pending studies and evolution of clinical course. It is noted that she's allergic to Reglan and Zofran.   Diabetes mellitus, new onset, uncontrolled -Aggressive hydration with IV fluids,  continue glipizide -Monitor Accu-Cheks and cover with sliding scale insulin -Obtain hemoglobin A1c, DM coordinator for inpatient education and follow.   Diarrhea -As above stool studies, TSH supportive care and follow. Tobacco abuse -Smoking cessation counseling     Code Status: full  Family Communication: family at bedside Disposition Plan: admit to [facility]  Time spent: >30  Kela Millin [facility] Pager [phone]  If 7PM-7AM, please contact night-coverage [email or web address] Password [email or web address]  04/12/2013, 2:27 PM

## 2013-04-13 ENCOUNTER — Observation Stay (HOSPITAL_COMMUNITY): Payer: PRIVATE HEALTH INSURANCE

## 2013-04-13 DIAGNOSIS — R197 Diarrhea, unspecified: Secondary | ICD-10-CM

## 2013-04-13 LAB — CBC
HCT: 28.6 % — ABNORMAL LOW (ref 36.0–46.0)
Hemoglobin: 8.3 g/dL — ABNORMAL LOW (ref 12.0–15.0)
MCH: 17.5 pg — ABNORMAL LOW (ref 26.0–34.0)
MCHC: 29 g/dL — ABNORMAL LOW (ref 30.0–36.0)
MCV: 60.5 fL — ABNORMAL LOW (ref 78.0–100.0)
Platelets: 317 10*3/uL (ref 150–400)
WBC: 8.3 10*3/uL (ref 4.0–10.5)

## 2013-04-13 LAB — COMPREHENSIVE METABOLIC PANEL
AST: 12 U/L (ref 0–37)
Albumin: 3.3 g/dL — ABNORMAL LOW (ref 3.5–5.2)
Alkaline Phosphatase: 61 U/L (ref 39–117)
BUN: 7 mg/dL (ref 6–23)
Calcium: 8.5 mg/dL (ref 8.4–10.5)
Creatinine, Ser: 0.43 mg/dL — ABNORMAL LOW (ref 0.50–1.10)
GFR calc Af Amer: >90 mL/min (ref >90)
Glucose, Bld: 339 mg/dL — ABNORMAL HIGH (ref 70–99)
Potassium: 3.4 mEq/L — ABNORMAL LOW (ref 3.5–5.1)
Total Protein: 7 g/dL (ref 6.0–8.3)

## 2013-04-13 LAB — URINE CULTURE: Colony Count: 25000

## 2013-04-13 LAB — GLUCOSE, CAPILLARY: Glucose-Capillary: 281 mg/dL — ABNORMAL HIGH (ref 70–99)

## 2013-04-13 MED ORDER — HYDROCODONE-ACETAMINOPHEN 5-325 MG PO TABS: 1.0000 | ORAL_TABLET | ORAL | Status: DC | PRN

## 2013-04-13 MED ORDER — PROMETHAZINE HCL 25 MG PO TABS: 25.0000 mg | ORAL_TABLET | ORAL | Status: DC | PRN

## 2013-04-13 MED ORDER — GLIPIZIDE 5 MG PO TABS: 5.0000 mg | ORAL_TABLET | Freq: Two times a day (BID) | ORAL | Status: DC

## 2013-04-13 MED ORDER — POTASSIUM CHLORIDE CRYS ER 20 MEQ PO TBCR
40.0000 meq | EXTENDED_RELEASE_TABLET | Freq: Once | ORAL | Status: DC
  Filled 2013-04-13: qty 2

## 2013-04-13 MED ORDER — METRONIDAZOLE 500 MG PO TABS: 500.0000 mg | ORAL_TABLET | Freq: Three times a day (TID) | ORAL | Status: AC

## 2013-04-13 MED ORDER — HYDROCODONE-ACETAMINOPHEN 5-325 MG PO TABS: 2.0000 | ORAL_TABLET | ORAL | Status: DC | PRN

## 2013-04-13 MED ORDER — LIVING WELL WITH DIABETES BOOK
Freq: Once | Status: DC
  Filled 2013-04-13: qty 1

## 2013-04-13 NOTE — Progress Notes (Addendum)
Inpatient Diabetes Program Recommendations  AACE/ADA: New Consensus Statement on Inpatient Glycemic Control (2013)  Target Ranges:  Prepandial:   less than 140 mg/dL      Peak postprandial:   less than 180 mg/dL (1-2 hours)      Critically ill patients:  140 - 180 mg/dL   Reason for Visit: MD Consult regarding new-onset diabetes  Note:  Patient with flat affect and minimally conversant with me.  Attempted to discuss with patient her new diagnosis of diabetes.  With mention of available educational videos, dietitian consult, outpatient education follow-up, she stated "I'm not interested".  Requested permission to order a Diabetes Booklet for her and she said "I guess that would be OK".  When I told her I am curious as to why she is not interested in learning about diabetes, she replied that there is "diabetes in the family".  She states she has been through all this before.  She has a 65 year old daughter with diabetes and on insulin.  Apologized to patient if I had offended her in any way-- Explained that I just wanted to be sure she gets what she needs.  Ordered booklet.   Thank you.  Jadia Capers S. Elsie Lincoln, RN, CNS, CDE Inpatient Diabetes Program, team pager [phone]  Addendum:   Patient diagnosed with diabetes day before admission.  Was to start Glucotrol 5 mg BID.  (Encouraged patient to obtain a PCP so she could get diabetes medication prescriptions receive care for diabetes on a regular basis.) Patient receiving Glucotrol here in the hospital.  On clear liquids/  May benefit from stopping oral agent temporarily while in hospital and switch totally to insulin secondary to potential risk of hypoglycemia.  CBG 281 this morning.  Request MD consider adding low-dose Lantus daily while in the hospital.  May also consider Metformin at discharge-- if not contraindicated-- instead of glucotrol.  Thank you.  Elizebath Wever S. Elsie Lincoln, RN, CNS, CDE Inpatient Diabetes Program, team pager [phone]

## 2013-04-13 NOTE — Progress Notes (Signed)
Patient received discharge instructions with daughter at bedside. Verbalized understanding without further questions. Patient follow up appointments and prescriptions discussed. Patient belongings packed. Patient to be taken down via wheelchair to be discharged home.

## 2013-04-13 NOTE — Care Management Note (Signed)
   CARE MANAGEMENT NOTE 04/13/2013  Patient:  TERREL, MANALO   Account Number:  [id number]  Date Initiated:  04/13/2013  Documentation initiated by:  Meckes,Angelene Rome  Subjective/Objective Assessment:   37 yo female admitted with right flank pain. No PCP on record.     Action/Plan:   Home when stable   Anticipated DC Date:     Anticipated DC Plan:  HOME/SELF CARE      DC Planning Services  CM consult      Choice offered to / List presented to:     DME arranged  NA      DME agency  NA     HH arranged  NA      HH agency  NA   Status of service:  Completed, signed off Medicare Important Message given?   (If response is "NO", the following Medicare IM given date fields will be blank) Date Medicare IM given:   Date Additional Medicare IM given:    Discharge Disposition:    Per UR Regulation:  Reviewed for med. necessity/level of care/duration of stay  If discussed at Long Length of Stay Meetings, dates discussed:    Comments:  04/13/13 1321 [address] [place],Rn,MSn [phone] Pt dmitted under different name. Unable to provide identification to verify name. No needs assessed.

## 2013-04-13 NOTE — Discharge Summary (Signed)
Physician Discharge Summary  [name] MWU:[id number] DOB: [birth year] DOA: 04/12/2013  PCP: Default, Provider, MD  Admit date: 04/12/2013 Discharge date: 04/13/2013  Recommendations for Outpatient Follow-up:  1. you may use Phenergan either by mouth or suppository dependent on if you're able to tolerate by mouth intake Follow up with PCP in 1-2 weeks post discharge to make sure symptoms resovled  Discharge Diagnoses:  Active Problems:   Abdominal  pain, other specified site   Diabetes mellitus, new onset   Diarrhea    Discharge Condition: Medically stable for discharge home today  Diet recommendation: As tolerated  History of present illness:  37 year old female who presented to [facility] for complaints of abdominal pain, nausea and vomiting. She is previously known as [name]. Patient received Dilaudid IV 0.5 - 1 mgevery 3 hours as needed. Patient reports pain to be improved this morning.  Hospital Course:   Active Problems:   Abdominal  pain, other specified site - Unclear ideology. Lipase level within normal limits. CT scan within normal limits - Prescription provided for Norco and Phenergan as needed for pain control and nausea/vomiting respectively   Diabetes mellitus - Continue Glucotrol   Diarrhea - Resolved   Signed:  Manson Passey, MD  [facility] 04/13/2013, 11:47 AM  Pager #: [phone]  Procedures:  None   Consultations:  None   Discharge Exam: Filed Vitals:   04/13/13 0545  BP: 137/77  Pulse: 95  Temp: 99.3 F (37.4 C)  Resp: 18   Filed Vitals:   04/12/13 1016 04/12/13 1420 04/12/13 2033 04/13/13 0545  BP: 157/73 165/75 149/61 137/77  Pulse: 97 96 86 95  Temp:  98.2 F (36.8 C) 99.8 F (37.7 C) 99.3 F (37.4 C)  TempSrc:  Oral Oral Oral  Resp: 18 18 18 18   Height:  5\' 6"  (1.676 m)    Weight:  91.899 kg (202 lb 9.6 oz)    SpO2: 97% 100% 100% 100%    General: Pt is alert, follows commands  appropriately, not in acute distress Cardiovascular: Regular rate and rhythm, S1/S2 +, no murmurs, no rubs, no gallops Respiratory: Clear to auscultation bilaterally, no wheezing, no crackles, no rhonchi Abdominal: Soft, non tender, non distended, bowel sounds +, no guarding Extremities: no edema, no cyanosis, pulses palpable bilaterally DP and PT Neuro: Grossly nonfocal  Discharge Instructions  Discharge Orders   Future Orders Complete By Expires   Call MD for:  difficulty breathing, headache or visual disturbances  As directed    Call MD for:  persistant dizziness or light-headedness  As directed    Call MD for:  persistant nausea and vomiting  As directed    Call MD for:  severe uncontrolled pain  As directed    Diet - low sodium heart healthy  As directed    Discharge instructions  As directed    Comments:     1. you may use Phenergan either by mouth or suppository dependent on if you're able to tolerate by mouth intake   Increase activity slowly  As directed        Medication List         glipiZIDE 5 MG tablet  Commonly known as:  GLUCOTROL  Take 1 tablet (5 mg total) by mouth 2 (two) times daily before a meal.     HYDROcodone-acetaminophen 5-325 MG per tablet  Commonly known as:  NORCO/VICODIN  Take 2 tablets by mouth every 4 (four) hours as needed for pain.  promethazine 25 MG suppository  Commonly known as:  PHENERGAN  Place 1 suppository (25 mg total) rectally every 6 (six) hours as needed for nausea.     promethazine 25 MG tablet  Commonly known as:  PHENERGAN  Take 1 tablet (25 mg total) by mouth every 4 (four) hours as needed.           Follow-up Information   Follow up with [facility]    . Call in 2 weeks.   Contact information:   [address] [place] [address]        The results of significant diagnostics from this hospitalization (including imaging, microbiology, ancillary and laboratory) are listed  below for reference.    Significant Diagnostic Studies: Ct Abdomen Pelvis Wo Contrast  04/11/2013   CLINICAL DATA:  Right flank pain, vomiting.  EXAM: CT ABDOMEN AND PELVIS WITHOUT CONTRAST  TECHNIQUE: Multidetector CT imaging of the abdomen and pelvis was performed following the standard protocol without intravenous contrast.  COMPARISON:  None.  FINDINGS: Lung bases are clear. No effusions. Heart is normal size.  Prior cholecystectomy. Liver, spleen, pancreas, adrenals and left kidney are unremarkable. 8 mm nonobstructing right renal midpole stone. No hydronephrosis. No ureteral stones.  Urinary bladder, uterus and adnexa have an unremarkable unenhanced appearance. Appendix is visualized and is normal. Bowel grossly unremarkable. No free fluid, free air, or adenopathy. Aorta is normal caliber.  Lung bases are clear. No effusions. Heart is normal size. No acute bony abnormality.  IMPRESSION: Right nephrolithiasis. No ureteral stones or hydronephrosis.  Normal appendix.   Electronically Signed   By: Charlett Nose M.D.   On: 04/11/2013 21:10   Dg Chest 2 View  04/13/2013   CLINICAL DATA:  Weakness, diabetes and renal calculi.  EXAM: CHEST - 2 VIEW  COMPARISON:  None  FINDINGS: The heart size and mediastinal contours are within normal limits. There is no evidence of pulmonary edema, consolidation, pneumothorax, nodule or pleural fluid. The visualized skeletal structures are unremarkable.  IMPRESSION: No active disease.   Electronically Signed   By: Irish Lack M.D.   On: 04/13/2013 08:14    Microbiology: No results found for this or any previous visit (from the past 240 hour(s)).   Labs: Basic Metabolic Panel:  Recent Labs Lab 04/11/13 2020 04/12/13 0820 04/13/13 0415  NA 133* 137 131*  K 3.8 3.8 3.4*  CL 95* 100 100  CO2 23  --  21  GLUCOSE 393* 379* 339*  BUN 8 8 7   CREATININE 0.36* 0.60 0.43*  CALCIUM 9.8  --  8.5   Liver Function Tests:  Recent Labs Lab 04/11/13 2020  04/13/13 0415  AST 14 12  ALT 10 6  ALKPHOS 92 61  BILITOT 0.5 0.4  PROT 9.0* 7.0  ALBUMIN 4.2 3.3*    Recent Labs Lab 04/11/13 2020  LIPASE 46   No results found for this basename: AMMONIA,  in the last 168 hours CBC:  Recent Labs Lab 04/11/13 2020 04/12/13 0820 04/13/13 0415  WBC 11.8*  --  8.3  NEUTROABS 10.5*  --   --   HGB 10.5* 12.2 8.3*  HCT 34.2* 36.0 28.6*  MCV 60.0*  --  60.5*  PLT 342  --  317   Cardiac Enzymes: No results found for this basename: CKTOTAL, CKMB, CKMBINDEX, TROPONINI,  in the last 168 hours BNP: BNP (last 3 results) No results found for this basename: PROBNP,  in the last 8760 hours  CBG:  Recent Labs Lab 04/12/13 1138 04/12/13 1708 04/12/13 2113 04/13/13 0816  GLUCAP 318* 320* 241* 281*    Time coordinating discharge: Over 30 minutes

## 2013-04-13 NOTE — Progress Notes (Signed)
Pt refused to go to xray several times earlier for CXR.  Radiology called and said they would try in the morning.

## 2013-04-13 NOTE — Progress Notes (Signed)
Notified by micro dept that C.diff PCR is positive for C.diff. I called the patient at [phone] to inform her of this result. She gave me the name of her pharmacy, [facility] on [address] [phone] and prescription called in for flagyl 500 mg every 8 hours for total of 2 weeks. Manson Passey [facility] [phone]

## 2013-04-14 ENCOUNTER — Encounter (HOSPITAL_COMMUNITY): Payer: Self-pay | Admitting: Emergency Medicine

## 2013-04-20 ENCOUNTER — Emergency Department (HOSPITAL_COMMUNITY)
Admission: EM | Admit: 2013-04-20 | Discharge: 2013-04-20 | Discharge status: Against Medical Advice | Payer: PRIVATE HEALTH INSURANCE | Attending: Emergency Medicine | Admitting: Emergency Medicine

## 2013-04-20 ENCOUNTER — Encounter (HOSPITAL_COMMUNITY): Payer: Self-pay | Admitting: Emergency Medicine

## 2013-04-20 DIAGNOSIS — M109 Gout, unspecified: Secondary | ICD-10-CM | POA: Insufficient documentation

## 2013-04-20 DIAGNOSIS — R112 Nausea with vomiting, unspecified: Secondary | ICD-10-CM | POA: Insufficient documentation

## 2013-04-20 DIAGNOSIS — Z9089 Acquired absence of other organs: Secondary | ICD-10-CM | POA: Insufficient documentation

## 2013-04-20 DIAGNOSIS — F411 Generalized anxiety disorder: Secondary | ICD-10-CM | POA: Insufficient documentation

## 2013-04-20 DIAGNOSIS — Z791 Long term (current) use of non-steroidal anti-inflammatories (NSAID): Secondary | ICD-10-CM | POA: Insufficient documentation

## 2013-04-20 DIAGNOSIS — Z87440 Personal history of urinary (tract) infections: Secondary | ICD-10-CM | POA: Insufficient documentation

## 2013-04-20 DIAGNOSIS — R1084 Generalized abdominal pain: Secondary | ICD-10-CM | POA: Insufficient documentation

## 2013-04-20 DIAGNOSIS — Z87442 Personal history of urinary calculi: Secondary | ICD-10-CM | POA: Insufficient documentation

## 2013-04-20 DIAGNOSIS — F17200 Nicotine dependence, unspecified, uncomplicated: Secondary | ICD-10-CM | POA: Insufficient documentation

## 2013-04-20 DIAGNOSIS — Z862 Personal history of diseases of the blood and blood-forming organs and certain disorders involving the immune mechanism: Secondary | ICD-10-CM | POA: Insufficient documentation

## 2013-04-20 DIAGNOSIS — E119 Type 2 diabetes mellitus without complications: Secondary | ICD-10-CM | POA: Insufficient documentation

## 2013-04-20 DIAGNOSIS — R109 Unspecified abdominal pain: Secondary | ICD-10-CM

## 2013-04-20 DIAGNOSIS — R197 Diarrhea, unspecified: Secondary | ICD-10-CM | POA: Insufficient documentation

## 2013-04-20 DIAGNOSIS — Z79899 Other long term (current) drug therapy: Secondary | ICD-10-CM | POA: Insufficient documentation

## 2013-04-20 DIAGNOSIS — Z794 Long term (current) use of insulin: Secondary | ICD-10-CM | POA: Insufficient documentation

## 2013-04-20 MED ORDER — SODIUM CHLORIDE 0.9 % IV BOLUS (SEPSIS): 1000.0000 mL | Freq: Once | INTRAVENOUS | Status: DC

## 2013-04-20 MED ORDER — METRONIDAZOLE IN NACL 5-0.79 MG/ML-% IV SOLN: 500.0000 mg | Freq: Three times a day (TID) | INTRAVENOUS | Status: DC

## 2013-04-20 MED ORDER — PROMETHAZINE HCL 25 MG/ML IJ SOLN
25.0000 mg | Freq: Once | INTRAMUSCULAR | Status: DC
  Filled 2013-04-20 (×2): qty 1

## 2013-04-20 MED ORDER — KETOROLAC TROMETHAMINE 30 MG/ML IJ SOLN: 30.0000 mg | Freq: Once | INTRAMUSCULAR | Status: DC

## 2013-04-20 NOTE — ED Provider Notes (Signed)
Medical screening examination/treatment/procedure(s) were conducted as a shared visit with non-physician practitioner(s) and myself.  I personally evaluated the patient during the encounter.  EKG Interpretation   None         Shelda Jakes, MD 04/20/13 [phone]

## 2013-04-20 NOTE — ED Notes (Signed)
Informed pt. That Dr. Deretha Emory will be into see pt. As soon as he can.  Apologized for the wait.  Gave pt.s son a box lunch and apple juice

## 2013-04-20 NOTE — ED Notes (Signed)
Checked patient cbg it was 79 notified RN Lawson Fiscal of blood sugar

## 2013-04-20 NOTE — ED Notes (Signed)
Pt. Decided not to stay.  Pt. Stated, "I am going to leave."  Encouraged pt. To stay, but she stated, "No."

## 2013-04-20 NOTE — ED Notes (Signed)
Pt. Reports having diarheea and not taking her medication Flagyl, She reports that she did not get the medication filled

## 2013-04-20 NOTE — ED Provider Notes (Signed)
Medical screening examination/treatment/procedure(s) were conducted as a shared visit with non-physician practitioner(s) and myself.  I personally evaluated the patient during the encounter.  EKG Interpretation   None        Patient seen by me. Patient treatment plan explained to include CT to further evaluate pain and flank pain and a previous diagnosis of a kidney stone. Labs recommended. Patient will be treated with Toradol only. Patient initially refused all labs all treatment plans list she was given a stronger pain medicine I explained to her that that's what were going to give her. Patient is seen frequently at [facility] and here for pain complaints. Patient often presents with a sickle cell pain but she only has sickle trait does not regularly have sickle cell disease.  Shelda Jakes, MD 04/20/13 [phone]

## 2013-04-20 NOTE — ED Notes (Addendum)
Pt. Was seen here last week and diagnosed with kidney stones.  She continues to have rt. Lower abdominal pain.  Her CBG was 475 via [facility].  Alert and oriented X3.

## 2013-04-20 NOTE — ED Notes (Signed)
Pt. Does not want an IV until she talks with Victorino Dike.  She does not want Toradol she would like another pain medication.

## 2013-04-20 NOTE — ED Provider Notes (Signed)
CSN: [id number]     Arrival date & time 04/20/13  1121 History   First MD Initiated Contact with Patient 04/20/13 1125     Chief Complaint  Patient presents with  . Abdominal Pain   (Consider location/radiation/quality/duration/timing/severity/associated sxs/prior Treatment) HPI Comments: Patient is a 37 yo F PMHx significant for DM, Depression, Anxiety, Bipolar 1 Disorder, Kidney calculi, Anemia, Sickle Cell trait presenting to the ED for multiple complaints. Patient's first complaint is two days of nausea, vomiting w/o gross hematemesis, and diarrhea. Patient states she was discharged from the hospital last week and called and told she had a positive C. Diff result, but never picked up her antibiotics for this. Patient is also complaining about severe right flank pain with radiation to right abdomen that is consistent with her kidney stones recently diagnosed. Patient has not been seen by the urologist yet regarding this issue. Patient states she was given nausea and pain medication at time of discharge but has been unable to tolerate them d/t emesis.    Past Medical History  Diagnosis Date  . Diabetes mellitus   . Headache(784.0)   . Seasonal allergies   . Abnormal Pap smear   . Gout   . Depression   . Anxiety   . UTI (urinary tract infection)   . Bipolar 1 disorder   . Kidney calculi   . Anemia    Past Surgical History  Procedure Laterality Date  . Cholecystectomy    . Esophagogastroduodenoscopy N/A 09/07/2012    Procedure: ESOPHAGOGASTRODUODENOSCOPY (EGD);  Surgeon: Charna Elizabeth, MD;  Location: [facility] ENDOSCOPY;  Service: Endoscopy;  Laterality: N/A;  . Cesarean section     Family History  Problem Relation Age of Onset  . Hypertension Mother   . Diabetes Mother   . Hypertension Maternal Aunt   . Diabetes Maternal Aunt   . Hypertension Maternal Uncle   . Diabetes Maternal Uncle   . Hypertension Maternal Grandmother    History  Substance Use Topics  . Smoking status: Current  Every Day Smoker -- 0.50 packs/day    Types: Cigarettes  . Smokeless tobacco: Not on file  . Alcohol Use: No   OB History   Grav Para Term Preterm Abortions TAB SAB Ect Mult Living   8 8 7 1  0  0   7     Review of Systems  Constitutional: Negative for fever.  Gastrointestinal: Positive for nausea, vomiting, abdominal pain and diarrhea. Negative for blood in stool.  Genitourinary: Positive for flank pain. Negative for dysuria, urgency, frequency, decreased urine volume, vaginal bleeding and vaginal discharge.  All other systems reviewed and are negative.    Allergies  Reglan; Reglan; Zofran; and Zofran  Home Medications   Current Outpatient Rx  Name  Route  Sig  Dispense  Refill  . alprazolam (XANAX) 2 MG tablet   Oral   Take 2 mg by mouth 3 (three) times daily as needed for sleep or anxiety.         Marland Kitchen amphetamine-dextroamphetamine (ADDERALL) 30 MG tablet   Oral   Take 30 mg by mouth 2 (two) times daily. Take 30 mg by mouth 2 times daily.         Marland Kitchen dicyclomine (BENTYL) 20 MG tablet   Oral   Take 1 tablet (20 mg total) by mouth every 6 (six) hours as needed (for abdominal cramping).   20 tablet   0   . famotidine (PEPCID) 20 MG tablet   Oral   Take 1  tablet (20 mg total) by mouth daily.   30 tablet   3   . glipiZIDE (GLUCOTROL) 5 MG tablet   Oral   Take 1 tablet (5 mg total) by mouth 2 (two) times daily before a meal.   30 tablet   0   . HYDROcodone-acetaminophen (NORCO/VICODIN) 5-325 MG per tablet   Oral   Take 2 tablets by mouth every 4 (four) hours as needed for pain.   15 tablet   0   . insulin aspart (NOVOLOG) 100 UNIT/ML injection   Subcutaneous   Inject 10-20 Units into the skin 3 (three) times daily before meals. Sliding scale         . insulin glargine (LANTUS) 100 UNIT/ML injection   Subcutaneous   Inject 0.8 mLs (80 Units total) into the skin at bedtime.   10 mL   12   . naproxen (NAPROSYN) 500 MG tablet   Oral   Take 1 tablet (500  mg total) by mouth 2 (two) times daily with a meal.   30 tablet   0   . oxyCODONE-acetaminophen (PERCOCET) 10-325 MG per tablet   Oral   Take 1 tablet by mouth every 6 (six) hours as needed for pain.   120 tablet   0   . promethazine (PHENERGAN) 25 MG suppository   Rectal   Place 1 suppository (25 mg total) rectally every 6 (six) hours as needed for nausea.   12 each   0   . promethazine (PHENERGAN) 25 MG tablet   Oral   Take 25 mg by mouth every 4 (four) hours as needed for nausea.         Marland Kitchen QUEtiapine (SEROQUEL) 300 MG tablet   Oral   Take 1 tablet (300 mg total) by mouth 2 (two) times daily. Take 300 mg by mouth 2 times daily.   60 tablet   0   . saccharomyces boulardii (FLORASTOR) 250 MG capsule   Oral   Take 1 capsule (250 mg total) by mouth 2 (two) times daily.   60 capsule   3   . sitaGLIPtin (JANUVIA) 50 MG tablet   Oral   Take 1 tablet (50 mg total) by mouth 2 (two) times daily. Take 50 mg by mouth 2 times daily.   60 tablet   3   . zolpidem (AMBIEN) 5 MG tablet   Oral   Take 1 tablet (5 mg total) by mouth at bedtime as needed for sleep (insomnia).   30 tablet   0    BP 138/69  Pulse 102  Temp(Src) 98.2 F (36.8 C) (Oral)  Resp 16  SpO2 99%  LMP 04/10/2013 Physical Exam  Constitutional: She is oriented to person, place, and time. She appears well-developed and well-nourished. No distress.  Patient sleeping upon my entrance to examination room  HENT:  Head: Normocephalic and atraumatic.  Right Ear: External ear normal.  Left Ear: External ear normal.  Nose: Nose normal.  Mouth/Throat: Oropharynx is clear and moist.  Eyes: Conjunctivae and EOM are normal.  Neck: Normal range of motion. Neck supple.  Cardiovascular: Normal rate, regular rhythm, normal heart sounds and intact distal pulses.   Pulmonary/Chest: Effort normal and breath sounds normal. No respiratory distress.  Abdominal: Soft. Bowel sounds are normal. She exhibits no distension.  There is no hepatosplenomegaly. There is generalized tenderness. There is no rigidity, no rebound, no guarding and no CVA tenderness.  Musculoskeletal: Normal range of motion. She exhibits no edema.  Neurological:  She is alert and oriented to person, place, and time.  Skin: Skin is warm and dry. She is not diaphoretic.    ED Course  Procedures (including critical care time)  Medications  ketorolac (TORADOL) 30 MG/ML injection 30 mg (not administered)  promethazine (PHENERGAN) injection 25 mg (not administered)  metroNIDAZOLE (FLAGYL) IVPB 500 mg (not administered)  sodium chloride 0.9 % bolus 1,000 mL (not administered)    Labs Review Labs Reviewed  CBC WITH DIFFERENTIAL  COMPREHENSIVE METABOLIC PANEL  LIPASE, BLOOD  URINALYSIS, ROUTINE W REFLEX MICROSCOPIC  PREGNANCY, URINE   Imaging Review No results found.  EKG Interpretation   None       MDM   1. Abdominal  pain, other specified site    Afebrile, NAD, non-toxic appearing, AAOx4. Benign abdomen on examination. No peritoneal signs. Plan to treat with Toradol, Phenergan, recheck labs and CT scan for kidney stone diagnosis w/ flank pain. Will also start Flagyl IV as patient has not started home Abx for C. Diff infection. Patient is a recurrent visitor to emergency department, including visits stating patient is in sickle cell pain crisis, but without actual sickle cell disease. There is a component of narcotic seeking here. Dr. Deretha Emory and I will not treat patient with narcotic medications here in the ED.   12:36 PM Patient is very angry about only being offered Toradol for pain management. She is refusing to have blood drawn or an IV started until she is provided narcotic pain medication. Discussed with patient that Toradol is an excellent pain medication, particularly in the setting of kidney stones and with frequent visits to the ED for pain management, Dr. Deretha Emory and I were not comfortable using any narcotic  medications. Patient now refusing to be seen by myself and requesting to see Dr. Deretha Emory.    Patient refusing to stay to have diagnostic testing done. Patient is requesting to leave against medical advice, and understands the risks of leaving without being fully evaluated. Patient d/w with Dr. Deretha Emory, agrees with plan.    Jeannetta Ellis, PA-C 04/20/13 1415

## 2013-04-21 NOTE — ED Provider Notes (Signed)
Medical screening examination/treatment/procedure(s) were conducted as a shared visit with non-physician practitioner(s) and myself.  I personally evaluated the patient during the encounter.  EKG Interpretation     Ventricular Rate:    PR Interval:    QRS Duration:   QT Interval:    QTC Calculation:   R Axis:     Text Interpretation:              Pt c/o right flank pain, nv. abd soft nt. Labs. Ivf. Pain rx.   Suzi Roots, MD 04/21/13 [phone]

## 2013-05-07 ENCOUNTER — Encounter: Payer: Self-pay | Admitting: Internal Medicine

## 2013-05-07 ENCOUNTER — Ambulatory Visit: Payer: PRIVATE HEALTH INSURANCE | Attending: Internal Medicine | Admitting: Internal Medicine

## 2013-05-07 VITALS — BP 136/84 | HR 86 | Temp 98.1°F | Resp 16 | Ht 66.0 in | Wt 201.0 lb

## 2013-05-07 DIAGNOSIS — E119 Type 2 diabetes mellitus without complications: Secondary | ICD-10-CM | POA: Insufficient documentation

## 2013-05-07 DIAGNOSIS — E079 Disorder of thyroid, unspecified: Secondary | ICD-10-CM

## 2013-05-07 DIAGNOSIS — R109 Unspecified abdominal pain: Secondary | ICD-10-CM

## 2013-05-07 DIAGNOSIS — G894 Chronic pain syndrome: Secondary | ICD-10-CM | POA: Insufficient documentation

## 2013-05-07 DIAGNOSIS — A0472 Enterocolitis due to Clostridium difficile, not specified as recurrent: Secondary | ICD-10-CM

## 2013-05-07 MED ORDER — QUETIAPINE FUMARATE 300 MG PO TABS: 300.0000 mg | ORAL_TABLET | Freq: Two times a day (BID) | ORAL | Status: DC

## 2013-05-07 MED ORDER — INSULIN ASPART 100 UNIT/ML ~~LOC~~ SOLN: 10.0000 [IU] | Freq: Three times a day (TID) | SUBCUTANEOUS | Status: DC

## 2013-05-07 MED ORDER — INSULIN GLARGINE 100 UNIT/ML SOLOSTAR PEN: 80.0000 [IU] | PEN_INJECTOR | Freq: Every day | SUBCUTANEOUS | Status: DC

## 2013-05-07 MED ORDER — FREESTYLE SYSTEM KIT: 1.0000 | PACK | Status: DC | PRN

## 2013-05-07 NOTE — Progress Notes (Signed)
Pt here with c/o left labia boil with drainage Medication refills Back pain with radiation all over

## 2013-05-07 NOTE — Progress Notes (Signed)
Patient Demographics  [name], is a 37 y.o. female  NUU:[id number]  HKV:[id number]  DOB - 04/14/1976  Chief Complaint  Patient presents with  . Follow-up  . Diabetes  . Hypertension  . Recurrent Skin Infections    boil        Subjective:   [name] today is here for a follow up visit. Patient is a 37 year old female with a known past medical history of chronic pain syndrome, history of narcotic seeking behavior, history of diabetes presents today for a followup visit. She was recently in the hospital for a C. difficile colitis flare and was discharged after debility. During her visit today she claims she now has diarrhea. Her abdominal pain is essentially unchanged -this is a chronic issue. She was asking for narcotics and benzodiazepines, however she was appearing very drowsy and had small pupils on exam. I have explained to the patient that we do not do long-term pain management here in the clinic, I would once again refer her to the pain management clinic. During one of her hospitalizations earlier this year, she had indicated to me that she had a appointment scheduled with the pain management specialist but she never kept. She also complains of a small swelling in her left labial area. There is no history of fever or any vaginal discharge.  She denies any other complaints  Objective:    Filed Vitals:   05/07/13 1656  BP: 136/84  Pulse: 86  Temp: 98.1 F (36.7 C)  TempSrc: Oral  Resp: 16  Height: 5\' 6"  (1.676 m)  Weight: 201 lb (91.173 kg)  SpO2: 100%     ALLERGIES:   Allergies  Allergen Reactions  . Reglan [Metoclopramide] Shortness Of Breath  . Reglan [Metoclopramide] Hives  . Zofran [Ondansetron Hcl] Hives and Nausea And Vomiting  . Zofran [Ondansetron Hcl] Hives    PAST MEDICAL HISTORY: Past Medical History  Diagnosis Date  . Diabetes mellitus   . Headache(784.0)   . Seasonal allergies   . Abnormal Pap smear   . Gout   . Depression   .  Anxiety   . UTI (urinary tract infection)   . Bipolar 1 disorder   . Kidney calculi   . Anemia     MEDICATIONS AT HOME: Prior to Admission medications   Medication Sig Start Date End Date Taking? Authorizing Provider  alprazolam Prudy Feeler) 2 MG tablet Take 2 mg by mouth 3 (three) times daily as needed for sleep or anxiety.   Yes Historical Provider, MD  amphetamine-dextroamphetamine (ADDERALL) 30 MG tablet Take 30 mg by mouth 2 (two) times daily. Take 30 mg by mouth 2 times daily.   Yes Historical Provider, MD  glipiZIDE (GLUCOTROL) 5 MG tablet Take 1 tablet (5 mg total) by mouth 2 (two) times daily before a meal. 04/13/13  Yes Alison Murray, MD  insulin aspart (NOVOLOG) 100 UNIT/ML injection Inject 10-20 Units into the skin 3 (three) times daily before meals. Sliding scale 05/07/13  Yes Kellene Mccleary Levora Dredge, MD  QUEtiapine (SEROQUEL) 300 MG tablet Take 1 tablet (300 mg total) by mouth 2 (two) times daily. Take 300 mg by mouth 2 times daily. 05/07/13  Yes Emmery Seiler Levora Dredge, MD  sitaGLIPtin (JANUVIA) 50 MG tablet Take 1 tablet (50 mg total) by mouth 2 (two) times daily. Take 50 mg by mouth 2 times daily. 03/12/13  Yes Alison Murray, MD  zolpidem (AMBIEN) 5 MG tablet Take 1 tablet (5 mg total) by mouth at bedtime as  needed for sleep (insomnia). 03/12/13  Yes Alison Murray, MD  dicyclomine (BENTYL) 20 MG tablet Take 1 tablet (20 mg total) by mouth every 6 (six) hours as needed (for abdominal cramping). 04/06/13   Olivia Mackie, MD  famotidine (PEPCID) 20 MG tablet Take 1 tablet (20 mg total) by mouth daily. 03/12/13   Alison Murray, MD  glucose monitoring kit (FREESTYLE) monitoring kit 1 each by Does not apply route as needed for other. 05/07/13   Vercie Pokorny Levora Dredge, MD  HYDROcodone-acetaminophen (NORCO/VICODIN) 5-325 MG per tablet Take 2 tablets by mouth every 4 (four) hours as needed for pain. 04/13/13   Alison Murray, MD  Insulin Glargine (LANTUS SOLOSTAR) 100 UNIT/ML SOPN Inject 80 Units into the skin at  bedtime. 05/07/13   Jacorion Klem Levora Dredge, MD  naproxen (NAPROSYN) 500 MG tablet Take 1 tablet (500 mg total) by mouth 2 (two) times daily with a meal. 04/04/13   Vida Roller, MD  oxyCODONE-acetaminophen (PERCOCET) 10-325 MG per tablet Take 1 tablet by mouth every 6 (six) hours as needed for pain. 03/12/13   Alison Murray, MD  promethazine (PHENERGAN) 25 MG suppository Place 1 suppository (25 mg total) rectally every 6 (six) hours as needed for nausea. 04/04/13   Vida Roller, MD  promethazine (PHENERGAN) 25 MG tablet Take 25 mg by mouth every 4 (four) hours as needed for nausea.    Historical Provider, MD  saccharomyces boulardii (FLORASTOR) 250 MG capsule Take 1 capsule (250 mg total) by mouth 2 (two) times daily. 03/12/13   Alison Murray, MD     Exam  General appearance :Awake, alert, not in any distress. Speech Clear. Not toxic Looking HEENT: Atraumatic and Normocephalic, pupils equally reactive to light and accomodation Neck: supple, no JVD. No cervical lymphadenopathy.  Chest:Good air entry bilaterally, no added sounds  CVS: S1 S2 regular, no murmurs.  Abdomen: Bowel sounds present, Non tender and not distended with no gaurding, rigidity or rebound. Small swelling in the labial area mildly tender. Extremities: B/L Lower Ext shows no edema, both legs are warm to touch Neurology: Awake alert, and oriented X 3, CN II-XII intact, Non focal Skin:No Rash Wounds:N/A    Data Review   CBC No results found for this basename: WBC, HGB, HCT, PLT, MCV, MCH, MCHC, RDW, NEUTRABS, LYMPHSABS, MONOABS, EOSABS, BASOSABS, BANDABS, BANDSABD,  in the last 168 hours  Chemistries   No results found for this basename: NA, K, CL, CO2, GLUCOSE, BUN, CREATININE, GFRCGP, CALCIUM, MG, AST, ALT, ALKPHOS, BILITOT,  in the last 168 hours ------------------------------------------------------------------------------------------------------------------ No results found for this basename: HGBA1C,  in the last 72  hours ------------------------------------------------------------------------------------------------------------------ No results found for this basename: CHOL, HDL, LDLCALC, TRIG, CHOLHDL, LDLDIRECT,  in the last 72 hours ------------------------------------------------------------------------------------------------------------------ No results found for this basename: TSH, T4TOTAL, FREET3, T3FREE, THYROIDAB,  in the last 72 hours ------------------------------------------------------------------------------------------------------------------ No results found for this basename: VITAMINB12, FOLATE, FERRITIN, TIBC, IRON, RETICCTPCT,  in the last 72 hours  Coagulation profile  No results found for this basename: INR, PROTIME,  in the last 168 hours    Assessment & Plan   Diabetes - Continue with Lantus and NovoLog per sliding scale - Recheck A1c prior to next visit  Chronic pain syndrome - She has a long-standing history of chronic pain syndrome, chronic narcotic use and narcotic seeking behavior. She appears very drowsy during this visit, I have explained to her that we will no longer will be going long-term pain management  in this clinic. I will refer her to pain medicine.  Recent history of C. difficile colitis - Results, completed Flagyl  Chronic abdominal pain - Is soft on exam, she had a CT scan of the abdomen on 10/18 which did not show any acute abnormalities. She's also had numerous CT scans in the past, she also had an endoscopy earlier this year which was unremarkable. 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 The patient verbalized understanding. The patient was told to call to get lab results if they haven't heard anything in the next week.

## 2013-05-09 ENCOUNTER — Other Ambulatory Visit: Payer: Self-pay | Admitting: Emergency Medicine

## 2013-05-09 NOTE — Addendum Note (Signed)
Addended by: Nonnie Done D on: 05/09/2013 10:25 AM   Modules accepted: Orders

## 2013-07-01 ENCOUNTER — Encounter: Payer: Self-pay | Admitting: Obstetrics & Gynecology

## 2013-07-27 ENCOUNTER — Encounter: Payer: Self-pay | Admitting: Obstetrics & Gynecology

## 2013-10-18 ENCOUNTER — Emergency Department (HOSPITAL_COMMUNITY)
Admission: EM | Admit: 2013-10-18 | Discharge: 2013-10-18 | Disposition: A | Discharge status: Home / Self Care | Payer: PRIVATE HEALTH INSURANCE | Attending: Emergency Medicine | Admitting: Emergency Medicine

## 2013-10-18 ENCOUNTER — Emergency Department (HOSPITAL_COMMUNITY): Payer: PRIVATE HEALTH INSURANCE

## 2013-10-18 ENCOUNTER — Encounter (HOSPITAL_COMMUNITY): Payer: Self-pay | Admitting: Emergency Medicine

## 2013-10-18 ENCOUNTER — Emergency Department (HOSPITAL_COMMUNITY)
Admission: EM | Admit: 2013-10-18 | Discharge: 2013-10-18 | Disposition: A | Discharge status: Home / Self Care | Payer: Self-pay | Attending: Emergency Medicine | Admitting: Emergency Medicine

## 2013-10-18 DIAGNOSIS — R1084 Generalized abdominal pain: Secondary | ICD-10-CM | POA: Insufficient documentation

## 2013-10-18 DIAGNOSIS — Z87440 Personal history of urinary (tract) infections: Secondary | ICD-10-CM | POA: Insufficient documentation

## 2013-10-18 DIAGNOSIS — Z794 Long term (current) use of insulin: Secondary | ICD-10-CM | POA: Insufficient documentation

## 2013-10-18 DIAGNOSIS — R109 Unspecified abdominal pain: Secondary | ICD-10-CM

## 2013-10-18 DIAGNOSIS — Z3202 Encounter for pregnancy test, result negative: Secondary | ICD-10-CM | POA: Insufficient documentation

## 2013-10-18 DIAGNOSIS — R1110 Vomiting, unspecified: Secondary | ICD-10-CM

## 2013-10-18 DIAGNOSIS — M545 Low back pain, unspecified: Secondary | ICD-10-CM | POA: Insufficient documentation

## 2013-10-18 DIAGNOSIS — F17200 Nicotine dependence, unspecified, uncomplicated: Secondary | ICD-10-CM | POA: Insufficient documentation

## 2013-10-18 DIAGNOSIS — E119 Type 2 diabetes mellitus without complications: Secondary | ICD-10-CM | POA: Insufficient documentation

## 2013-10-18 DIAGNOSIS — Z87442 Personal history of urinary calculi: Secondary | ICD-10-CM | POA: Insufficient documentation

## 2013-10-18 DIAGNOSIS — F411 Generalized anxiety disorder: Secondary | ICD-10-CM | POA: Insufficient documentation

## 2013-10-18 DIAGNOSIS — Z862 Personal history of diseases of the blood and blood-forming organs and certain disorders involving the immune mechanism: Secondary | ICD-10-CM | POA: Insufficient documentation

## 2013-10-18 DIAGNOSIS — Z79899 Other long term (current) drug therapy: Secondary | ICD-10-CM | POA: Insufficient documentation

## 2013-10-18 DIAGNOSIS — K1370 Unspecified lesions of oral mucosa: Secondary | ICD-10-CM | POA: Insufficient documentation

## 2013-10-18 DIAGNOSIS — R112 Nausea with vomiting, unspecified: Secondary | ICD-10-CM | POA: Insufficient documentation

## 2013-10-18 DIAGNOSIS — F3130 Bipolar disorder, current episode depressed, mild or moderate severity, unspecified: Secondary | ICD-10-CM | POA: Insufficient documentation

## 2013-10-18 HISTORY — DX: Type 2 diabetes mellitus without complications: E11.9

## 2013-10-18 HISTORY — DX: Malignant neoplasm of tongue, unspecified: C02.9

## 2013-10-18 LAB — CBC WITH DIFFERENTIAL/PLATELET
Basophils Absolute: 0.1 10*3/uL (ref 0.0–0.1)
Basophils Relative: 1 % (ref 0–1)
EOS PCT: 1 % (ref 0–5)
Eosinophils Absolute: 0.1 10*3/uL (ref 0.0–0.7)
HEMATOCRIT: 35.3 % — AB (ref 36.0–46.0)
Hemoglobin: 11 g/dL — ABNORMAL LOW (ref 12.0–15.0)
LYMPHS ABS: 1.2 10*3/uL (ref 0.7–4.0)
Lymphocytes Relative: 9 % — ABNORMAL LOW (ref 12–46)
MCH: 20.3 pg — ABNORMAL LOW (ref 26.0–34.0)
MCHC: 31.2 g/dL (ref 30.0–36.0)
MCV: 65 fL — ABNORMAL LOW (ref 78.0–100.0)
MONOS PCT: 4 % (ref 3–12)
Monocytes Absolute: 0.5 10*3/uL (ref 0.1–1.0)
Neutro Abs: 11.4 10*3/uL — ABNORMAL HIGH (ref 1.7–7.7)
Neutrophils Relative %: 85 % — ABNORMAL HIGH (ref 43–77)
RBC: 5.43 MIL/uL — ABNORMAL HIGH (ref 3.87–5.11)
RDW: 22.8 % — ABNORMAL HIGH (ref 11.5–15.5)
WBC: 13.3 10*3/uL — AB (ref 4.0–10.5)

## 2013-10-18 LAB — PREGNANCY, URINE: PREG TEST UR: NEGATIVE

## 2013-10-18 LAB — URINALYSIS, ROUTINE W REFLEX MICROSCOPIC
BILIRUBIN URINE: NEGATIVE
Glucose, UA: 500 mg/dL — AB
KETONES UR: 15 mg/dL — AB
Leukocytes, UA: NEGATIVE
Nitrite: NEGATIVE
PROTEIN: 100 mg/dL — AB
Urobilinogen, UA: 0.2 mg/dL (ref 0.0–1.0)
pH: 6.5 (ref 5.0–8.0)

## 2013-10-18 LAB — COMPREHENSIVE METABOLIC PANEL
ALBUMIN: 4.2 g/dL (ref 3.5–5.2)
ALT: 8 U/L (ref 0–35)
AST: 16 U/L (ref 0–37)
Alkaline Phosphatase: 81 U/L (ref 39–117)
BUN: 5 mg/dL — ABNORMAL LOW (ref 6–23)
CHLORIDE: 97 meq/L (ref 96–112)
CO2: 25 mEq/L (ref 19–32)
CREATININE: 0.47 mg/dL — AB (ref 0.50–1.10)
Calcium: 9.9 mg/dL (ref 8.4–10.5)
GFR calc Af Amer: >90 mL/min (ref >90)
GFR calc non Af Amer: >90 mL/min (ref >90)
GLUCOSE: 221 mg/dL — AB (ref 70–99)
POTASSIUM: 3.5 meq/L — AB (ref 3.7–5.3)
Sodium: 138 mEq/L (ref 137–147)
Total Bilirubin: 0.3 mg/dL (ref 0.3–1.2)
Total Protein: 8.9 g/dL — ABNORMAL HIGH (ref 6.0–8.3)

## 2013-10-18 LAB — URINE MICROSCOPIC-ADD ON

## 2013-10-18 LAB — LIPASE, BLOOD: LIPASE: 36 U/L (ref 11–59)

## 2013-10-18 MED ORDER — HYDROMORPHONE HCL PF 1 MG/ML IJ SOLN
1.0000 mg | Freq: Once | INTRAMUSCULAR | Status: AC
  Administered 2013-10-18: 1 mg via INTRAVENOUS
  Filled 2013-10-18: qty 1

## 2013-10-18 MED ORDER — DICYCLOMINE HCL 10 MG PO CAPS
10.0000 mg | ORAL_CAPSULE | Freq: Once | ORAL | Status: AC
  Administered 2013-10-18: 10 mg via ORAL
  Filled 2013-10-18: qty 1

## 2013-10-18 MED ORDER — MORPHINE SULFATE 4 MG/ML IJ SOLN
4.0000 mg | Freq: Once | INTRAMUSCULAR | Status: AC
  Administered 2013-10-18: 4 mg via INTRAVENOUS
  Filled 2013-10-18: qty 1

## 2013-10-18 MED ORDER — PROMETHAZINE HCL 25 MG/ML IJ SOLN
25.0000 mg | Freq: Once | INTRAMUSCULAR | Status: AC
  Administered 2013-10-18: 25 mg via INTRAVENOUS
  Filled 2013-10-18: qty 1

## 2013-10-18 MED ORDER — IOHEXOL 300 MG/ML  SOLN
100.0000 mL | Freq: Once | INTRAMUSCULAR | Status: AC | PRN
  Administered 2013-10-18: 100 mL via INTRAVENOUS

## 2013-10-18 MED ORDER — SODIUM CHLORIDE 0.9 % IV BOLUS (SEPSIS)
1000.0000 mL | INTRAVENOUS | Status: AC
  Administered 2013-10-18: 1000 mL via INTRAVENOUS

## 2013-10-18 MED ORDER — PROMETHAZINE HCL 25 MG/ML IJ SOLN
12.5000 mg | Freq: Once | INTRAMUSCULAR | Status: DC
  Filled 2013-10-18: qty 1

## 2013-10-18 NOTE — ED Notes (Signed)
Pt was recently dx with tongue CA and has not been feeling well since this morning w/ N/V. Alert and oriented. 20g LAC.  

## 2013-10-18 NOTE — ED Notes (Addendum)
Pt was recently dx with tongue CA and has not been feeling well since this morning w/ N/V. Alert and oriented. 20g LAC.

## 2013-10-18 NOTE — ED Notes (Signed)
Patient tried to urinate but couldnt 

## 2013-10-18 NOTE — ED Notes (Signed)
Pt discharged alert and oriented and stable. Stated she would check back in but charge nurse and doctor notified.

## 2013-10-18 NOTE — ED Provider Notes (Signed)
Complains of diffuse abdominal pain onset this morning. 3 episodes of diarrhea. Has had multiple episodes of vomiting. Patient recently diagnosed with tongue cancer with biopsy. On exam appears uncomfortable HEENT exam is clean appearing 3 mm open wound to the right side of her tongue. Abdomen nondistended, normoactive bowel sounds. Diffusely tender.  Orlie Dakin, MD 10/18/13 2001

## 2013-10-18 NOTE — ED Notes (Addendum)
Pt provided registration with not the legal name of the pt and the wrong pt. 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

## 2013-10-18 NOTE — ED Provider Notes (Signed)
CSN: [id number]     Arrival date & time 10/18/13  1923 History   First MD Initiated Contact with Patient 10/18/13 1932     Chief Complaint  Patient presents with  . Emesis     (Consider location/radiation/quality/duration/timing/severity/associated sxs/prior Treatment) HPI Pt is a 38yo female initially attempted to check into ED as [name] and provided incorrect date of birth, pt was correctly identified as [name].  She has a hx of DM, anxiety, depression, bipolar 1 disorder, renal calculi and recent dx of tongue cancer presenting to ED c/o vomiting "since this morning." pt states she cannot count how many times she vomiting. Denies blood or bile in emesis. Pt states she was diagnosed with tongue cancer last week with biopsy, has f/u appointment with [name], oncology, 5/14.  Pt also c/o lower back pain and diffuse abdominal pain, that is constant, 10/10. Pt states she has had similar abdominal pain in past. States she had hx of renal stone last year. States she has been taking percocet for tongue pain w/o relief.  Denies urinary or vaginal symptoms. Denies fever or diarrhea. Denies sick contacts or recent travel.   PCP: Melinda Cooley, FNP  Past Medical History  Diagnosis Date  . Diabetes mellitus   . Headache(784.0)   . Seasonal allergies   . Abnormal Pap smear   . Gout   . Depression   . Anxiety   . UTI (urinary tract infection)   . Bipolar 1 disorder   . Kidney calculi   . Anemia    Past Surgical History  Procedure Laterality Date  . Cholecystectomy    . Esophagogastroduodenoscopy N/A 09/07/2012    Procedure: ESOPHAGOGASTRODUODENOSCOPY (EGD);  Surgeon: Melinda Craver, MD;  Location: [facility] ENDOSCOPY;  Service: Endoscopy;  Laterality: N/A;  . Cesarean section     Family History  Problem Relation Age of Onset  . Hypertension Mother   . Diabetes Mother   . Hypertension Maternal Aunt   . Diabetes Maternal Aunt   . Hypertension Maternal Uncle   . Diabetes Maternal  Uncle   . Hypertension Maternal Grandmother    History  Substance Use Topics  . Smoking status: Current Every Day Smoker -- 0.50 packs/day    Types: Cigarettes  . Smokeless tobacco: Not on file  . Alcohol Use: No   OB History   Grav Para Term Preterm Abortions TAB SAB Ect Mult Living   '8 8 7 1 ' 0  0   7     Review of Systems  Constitutional: Negative for chills.  HENT: Positive for mouth sores (ulceration right lateral tongue). Negative for sore throat.   Cardiovascular: Negative for chest pain.  Gastrointestinal: Positive for nausea, vomiting and abdominal pain. Negative for diarrhea.  Genitourinary: Negative for dysuria, urgency, hematuria, flank pain, decreased urine volume, vaginal discharge, vaginal pain and pelvic pain.  Musculoskeletal: Positive for back pain ( lower).  All other systems reviewed and are negative.     Allergies  Reglan; Reglan; Zofran; and Zofran  Home Medications   Prior to Admission medications   Medication Sig Start Date End Date Taking? Authorizing Provider  alprazolam [name]) 2 MG tablet Take 2 mg by mouth 3 (three) times daily as needed for sleep or anxiety.    Historical Provider, MD  amphetamine-dextroamphetamine (ADDERALL) 30 MG tablet Take 30 mg by mouth 2 (two) times daily. Take 30 mg by mouth 2 times daily.    Historical Provider, MD  dicyclomine (BENTYL) 20 MG tablet Take 1  tablet (20 mg total) by mouth every 6 (six) hours as needed (for abdominal cramping). 04/06/13   Melinda Drape, MD  famotidine (PEPCID) 20 MG tablet Take 1 tablet (20 mg total) by mouth daily. 03/12/13   Melinda Lis, MD  glipiZIDE (GLUCOTROL) 5 MG tablet Take 1 tablet (5 mg total) by mouth 2 (two) times daily before a meal. 04/13/13   Melinda Lis, MD  glucose monitoring kit (FREESTYLE) monitoring kit 1 each by Does not apply route as needed for other. Please test strips for ac and hs checks 05/07/13   Melinda Osgood, MD  HYDROcodone-acetaminophen (NORCO/VICODIN)  5-325 MG per tablet Take 2 tablets by mouth every 4 (four) hours as needed for pain. 04/13/13   Melinda Lis, MD  insulin aspart (NOVOLOG) 100 UNIT/ML injection Inject 10-20 Units into the skin 3 (three) times daily before meals. Sliding scale 05/07/13   Melinda Osgood, MD  Insulin Glargine (LANTUS SOLOSTAR) 100 UNIT/ML SOPN Inject 80 Units into the skin at bedtime. 05/07/13   Melinda Kristeen Mans, MD  naproxen (NAPROSYN) 500 MG tablet Take 1 tablet (500 mg total) by mouth 2 (two) times daily with a meal. 04/04/13   Melinda Acosta, MD  oxyCODONE-acetaminophen (PERCOCET) 10-325 MG per tablet Take 1 tablet by mouth every 6 (six) hours as needed for pain. 03/12/13   Melinda Lis, MD  promethazine (PHENERGAN) 25 MG suppository Place 1 suppository (25 mg total) rectally every 6 (six) hours as needed for nausea. 04/04/13   Melinda Acosta, MD  promethazine (PHENERGAN) 25 MG tablet Take 25 mg by mouth every 4 (four) hours as needed for nausea.    Historical Provider, MD  QUEtiapine (SEROQUEL) 300 MG tablet Take 1 tablet (300 mg total) by mouth 2 (two) times daily. Take 300 mg by mouth 2 times daily. 05/07/13   Melinda Kristeen Mans, MD  saccharomyces boulardii (FLORASTOR) 250 MG capsule Take 1 capsule (250 mg total) by mouth 2 (two) times daily. 03/12/13   Melinda Lis, MD  sitaGLIPtin (JANUVIA) 50 MG tablet Take 1 tablet (50 mg total) by mouth 2 (two) times daily. Take 50 mg by mouth 2 times daily. 03/12/13   Melinda Lis, MD  zolpidem (AMBIEN) 5 MG tablet Take 1 tablet (5 mg total) by mouth at bedtime as needed for sleep (insomnia). 03/12/13   Melinda Lis, MD   BP 172/66  Pulse 100  Temp(Src) 98.5 F (36.9 C) (Oral)  SpO2 100% Physical Exam  Nursing note and vitals reviewed. Constitutional: She appears well-developed and well-nourished.  Pt fidgeting in bed, holding stomach, previously walking halls and pacing in room, appears uncomfortable.  HENT:  Head: Normocephalic and atraumatic.  Mouth/Throat:  Uvula is midline, oropharynx is clear and moist and mucous membranes are normal. Oral lesions ( ulceration right lateral tongue) present.  Eyes: Conjunctivae are normal. No scleral icterus.  Neck: Normal range of motion.  Cardiovascular: Normal rate, regular rhythm and normal heart sounds.   Pulmonary/Chest: Effort normal and breath sounds normal. No respiratory distress. She has no wheezes. She has no rales. She exhibits no tenderness.  Abdominal: Soft. Bowel sounds are normal. She exhibits no distension and no mass. There is tenderness. There is guarding. There is no rebound.  Soft, non-distended, diffuse tenderness with guarding, no rebound or masses. No focal tenderness. No CVAT  Musculoskeletal: Normal range of motion. She exhibits tenderness ( lumbar musculature).  Neurological: She is alert.  Skin: Skin is warm  and dry.  Psychiatric: She has a normal mood and affect. Her speech is normal and behavior is normal.    ED Course  Procedures (including critical care time) Labs Review Labs Reviewed  CBC WITH DIFFERENTIAL - Abnormal; Notable for the following:    WBC 13.3 (*)    RBC 5.43 (*)    Hemoglobin 11.0 (*)    HCT 35.3 (*)    MCV 65.0 (*)    MCH 20.3 (*)    RDW 22.8 (*)    Neutrophils Relative % 85 (*)    Lymphocytes Relative 9 (*)    Neutro Abs 11.4 (*)    All other components within normal limits  COMPREHENSIVE METABOLIC PANEL - Abnormal; Notable for the following:    Potassium 3.5 (*)    Glucose, Bld 221 (*)    BUN 5 (*)    Creatinine, Ser 0.47 (*)    Total Protein 8.9 (*)    All other components within normal limits  URINALYSIS, ROUTINE W REFLEX MICROSCOPIC - Abnormal; Notable for the following:    Specific Gravity, Urine >1.046 (*)    Glucose, UA 500 (*)    Hgb urine dipstick TRACE (*)    Ketones, ur 15 (*)    Protein, ur 100 (*)    All other components within normal limits  LIPASE, BLOOD  PREGNANCY, URINE  URINE MICROSCOPIC-ADD ON    Imaging Review Ct  Abdomen Pelvis W Contrast  10/18/2013   CLINICAL DATA:  Diffuse abdominal pain and vomiting.  Diarrhea.  EXAM: CT ABDOMEN AND PELVIS WITH CONTRAST  TECHNIQUE: Multidetector CT imaging of the abdomen and pelvis was performed using the standard protocol following bolus administration of intravenous contrast.  CONTRAST:  168m OMNIPAQUE IOHEXOL 300 MG/ML  SOLN  COMPARISON:  Abdominal ultrasound performed 04/06/2013, and CT of the abdomen and pelvis from 03/08/2013  FINDINGS: Minimal right basilar atelectasis is noted.  Evaluation is suboptimal due to motion artifact.  The liver and spleen are unremarkable in appearance. The patient is status post cholecystectomy, with clips noted along the gallbladder fossa. The pancreas and adrenal glands are unremarkable.  A 1.0 cm nonobstructing stone is noted near the upper pole of the right kidney. A smaller stone is also noted at the lower pole of the right kidney. The left kidney is unremarkable in appearance. There is no evidence of hydronephrosis. No obstructing ureteral stones are seen. No perinephric stranding is appreciated.  No free fluid is identified. The small bowel is unremarkable in appearance. The stomach is within normal limits. No acute vascular abnormalities are seen.  The appendix is normal in caliber, without evidence for appendicitis. The colon is unremarkable in appearance.  The bladder is mildly distended and grossly unremarkable. The uterus is normal in size, with a few small fibroids. The ovaries are relatively symmetric; no suspicious adnexal masses are seen. No inguinal lymphadenopathy is seen.  No acute osseous abnormalities are identified.  IMPRESSION: 1. No acute abnormality seen within the abdomen or pelvis. 2. Nonobstructing right renal stones seen, measuring up to 1.0 cm in size. 3. Few small fibroids noted within the uterus.   Electronically Signed   By: JGarald BaldingM.D.   On: 10/18/2013 22:04     EKG Interpretation None      MDM    Final diagnoses:  Emesis  Abdominal pain    Pt is a 89yofemale who initially presented to ED as LLowella Gripwith incorrect DOB. Pt was confronted by [employer] and charge  nurse about name.  Pt was then correctly entered into the system as [name].  Pt c/o vomiting, abdominal pain and tongue pain.  Before pt could even be triage, pt entered ED provider room 3 times asking when she would be seen within 10-15 minute window. Pt advised to remain in her room so someone could come assess her.  Pt asked for dilaudid specifically multiple times.  Pt has vomited twice on the floor in ED after given multiple emesis bags and politely asked to notify nurse if she needed assistance.   Labs ordered: CBC, CMP, Lipase, UA=unremarkable. Urine pregnancy: negative. CT Abd: no acute abnormality seen within abdomen or pelvis. Non-obstructing right renal stones seen, measuring up to 1.0cm in size.  Few small fibroids noted w/n uterus.    Discussed pt with Dr. Winfred Leeds.  Pt will be clear for discharge if able to pass fluid challenge. Abdominal pain and vomiting sound chronic in nature. Will advised to f/u with GI specialist.    11:17 PM Pt sleeping at this time, easily awakened. Was able to keep down several ounces of water.  According to [place] Controlled Substance Database, pt filled prescrition for 8 day supply of oxycodone-acetaminophen (10/325) on 10/14/13.  Will discharge pt home, advised to f/u with PCP and GI specialist.    Upon discharge pt stated she was going to check back in.  Discussed with pt she has been medically cleared. Symptoms appear chronic in nature. Labs: unremarkable. CT abd-unremarkable.  Pt left ED without incident.    Noland Fordyce, PA-C 10/19/13 [phone]

## 2013-10-18 NOTE — ED Notes (Signed)
Pt gave false name and birthdate, therefore she has been here for 45 minutes as that was deciphered. Pt was also wandering in halls and going to doctor's office after being asked repeatedly to stay in room so as she can be seen. Pt was confronted by [employer], security, main nurse and charge nurse about name. Alert and oriented at this time.

## 2013-10-18 NOTE — ED Notes (Signed)
Pt has vomited on floor x 2 despite emesis bags provided for pt to use; Emesis bags are in the bed beside pt; pt states "I throw up in the floor at home"; pt advised to use Emesis bags provided to her and to not throw up in the floor.

## 2013-10-18 NOTE — ED Notes (Signed)
Bed: WA02 Expected date:  Expected time:  Means of arrival:  Comments: EMS - n/v ca pt

## 2013-10-19 ENCOUNTER — Encounter (HOSPITAL_COMMUNITY): Payer: Self-pay | Admitting: Emergency Medicine

## 2013-10-19 ENCOUNTER — Emergency Department (HOSPITAL_COMMUNITY)
Admission: EM | Admit: 2013-10-19 | Discharge: 2013-10-19 | Disposition: A | Discharge status: Home / Self Care | Payer: PRIVATE HEALTH INSURANCE | Attending: Emergency Medicine | Admitting: Emergency Medicine

## 2013-10-19 DIAGNOSIS — E119 Type 2 diabetes mellitus without complications: Secondary | ICD-10-CM | POA: Insufficient documentation

## 2013-10-19 DIAGNOSIS — F319 Bipolar disorder, unspecified: Secondary | ICD-10-CM | POA: Insufficient documentation

## 2013-10-19 DIAGNOSIS — Z9889 Other specified postprocedural states: Secondary | ICD-10-CM | POA: Insufficient documentation

## 2013-10-19 DIAGNOSIS — Z9089 Acquired absence of other organs: Secondary | ICD-10-CM | POA: Insufficient documentation

## 2013-10-19 DIAGNOSIS — Z79899 Other long term (current) drug therapy: Secondary | ICD-10-CM | POA: Insufficient documentation

## 2013-10-19 DIAGNOSIS — F329 Major depressive disorder, single episode, unspecified: Secondary | ICD-10-CM | POA: Insufficient documentation

## 2013-10-19 DIAGNOSIS — K1370 Unspecified lesions of oral mucosa: Secondary | ICD-10-CM | POA: Insufficient documentation

## 2013-10-19 DIAGNOSIS — Z862 Personal history of diseases of the blood and blood-forming organs and certain disorders involving the immune mechanism: Secondary | ICD-10-CM | POA: Insufficient documentation

## 2013-10-19 DIAGNOSIS — R231 Pallor: Secondary | ICD-10-CM | POA: Insufficient documentation

## 2013-10-19 DIAGNOSIS — R109 Unspecified abdominal pain: Secondary | ICD-10-CM | POA: Insufficient documentation

## 2013-10-19 DIAGNOSIS — Z87442 Personal history of urinary calculi: Secondary | ICD-10-CM | POA: Insufficient documentation

## 2013-10-19 DIAGNOSIS — F411 Generalized anxiety disorder: Secondary | ICD-10-CM | POA: Insufficient documentation

## 2013-10-19 DIAGNOSIS — F17200 Nicotine dependence, unspecified, uncomplicated: Secondary | ICD-10-CM | POA: Insufficient documentation

## 2013-10-19 DIAGNOSIS — Z87440 Personal history of urinary (tract) infections: Secondary | ICD-10-CM | POA: Insufficient documentation

## 2013-10-19 DIAGNOSIS — F3289 Other specified depressive episodes: Secondary | ICD-10-CM | POA: Insufficient documentation

## 2013-10-19 DIAGNOSIS — R112 Nausea with vomiting, unspecified: Secondary | ICD-10-CM | POA: Insufficient documentation

## 2013-10-19 DIAGNOSIS — G8929 Other chronic pain: Secondary | ICD-10-CM | POA: Insufficient documentation

## 2013-10-19 DIAGNOSIS — Z794 Long term (current) use of insulin: Secondary | ICD-10-CM | POA: Insufficient documentation

## 2013-10-19 LAB — CBG MONITORING, ED: GLUCOSE-CAPILLARY: 338 mg/dL — AB (ref 70–99)

## 2013-10-19 MED ORDER — OXYCODONE-ACETAMINOPHEN 5-325 MG PO TABS
2.0000 | ORAL_TABLET | Freq: Once | ORAL | Status: AC
  Administered 2013-10-19: 2 via ORAL
  Filled 2013-10-19: qty 2

## 2013-10-19 MED ORDER — DICYCLOMINE HCL 10 MG PO CAPS
10.0000 mg | ORAL_CAPSULE | Freq: Once | ORAL | Status: AC
  Administered 2013-10-19: 10 mg via ORAL
  Filled 2013-10-19: qty 1

## 2013-10-19 MED ORDER — PROMETHAZINE HCL 25 MG RE SUPP
25.0000 mg | Freq: Four times a day (QID) | RECTAL | Status: DC | PRN
  Administered 2013-10-19: 25 mg via RECTAL
  Filled 2013-10-19 (×2): qty 1

## 2013-10-19 MED ORDER — KETOROLAC TROMETHAMINE 30 MG/ML IJ SOLN
30.0000 mg | Freq: Once | INTRAMUSCULAR | Status: AC
  Administered 2013-10-19: 30 mg via INTRAMUSCULAR
  Filled 2013-10-19: qty 1

## 2013-10-19 MED ORDER — HALOPERIDOL LACTATE 5 MG/ML IJ SOLN
2.0000 mg | Freq: Once | INTRAMUSCULAR | Status: AC
  Administered 2013-10-19: 2 mg via INTRAMUSCULAR
  Filled 2013-10-19: qty 1

## 2013-10-19 NOTE — ED Provider Notes (Signed)
Patient care acquired from Junius Creamer, NP. Discharge planned once symptoms controlled.   Medications  promethazine (PHENERGAN) suppository 25 mg (25 mg Rectal Given 10/19/13 0457)  oxyCODONE-acetaminophen (PERCOCET/ROXICET) 5-325 MG per tablet 2 tablet (2 tablets Oral Given 10/19/13 0607)  dicyclomine (BENTYL) capsule 10 mg (10 mg Oral Given 10/19/13 0607)  haloperidol lactate (HALDOL) injection 2 mg (2 mg Intramuscular Given 10/19/13 0533)  ketorolac (TORADOL) 30 MG/ML injection 30 mg (30 mg Intramuscular Given 10/19/13 0532)   Results for orders placed during the hospital encounter of 10/19/13  CBG MONITORING, ED      Result Value Ref Range   Glucose-Capillary 338 (*) 70 - 99 mg/dL   Ct Abdomen Pelvis W Contrast  10/18/2013   CLINICAL DATA:  Diffuse abdominal pain and vomiting.  Diarrhea.  EXAM: CT ABDOMEN AND PELVIS WITH CONTRAST  TECHNIQUE: Multidetector CT imaging of the abdomen and pelvis was performed using the standard protocol following bolus administration of intravenous contrast.  CONTRAST:  164mL OMNIPAQUE IOHEXOL 300 MG/ML  SOLN  COMPARISON:  Abdominal ultrasound performed 04/06/2013, and CT of the abdomen and pelvis from 03/08/2013  FINDINGS: Minimal right basilar atelectasis is noted.  Evaluation is suboptimal due to motion artifact.  The liver and spleen are unremarkable in appearance. The patient is status post cholecystectomy, with clips noted along the gallbladder fossa. The pancreas and adrenal glands are unremarkable.  A 1.0 cm nonobstructing stone is noted near the upper pole of the right kidney. A smaller stone is also noted at the lower pole of the right kidney. The left kidney is unremarkable in appearance. There is no evidence of hydronephrosis. No obstructing ureteral stones are seen. No perinephric stranding is appreciated.  No free fluid is identified. The small bowel is unremarkable in appearance. The stomach is within normal limits. No acute vascular abnormalities are seen.   The appendix is normal in caliber, without evidence for appendicitis. The colon is unremarkable in appearance.  The bladder is mildly distended and grossly unremarkable. The uterus is normal in size, with a few small fibroids. The ovaries are relatively symmetric; no suspicious adnexal masses are seen. No inguinal lymphadenopathy is seen.  No acute osseous abnormalities are identified.  IMPRESSION: 1. No acute abnormality seen within the abdomen or pelvis. 2. Nonobstructing right renal stones seen, measuring up to 1.0 cm in size. 3. Few small fibroids noted within the uterus.   Electronically Signed   By: Garald Balding M.D.   On: 10/18/2013 22:04   The encounter diagnosis was Nausea & vomiting.   Patient asymptomatic after receiving medications. No further episodes of emesis while in ED after shift change. Will discharge home with return precautions and advised use of prescriptions for pain medications and anti-emetics already provided to her by her primary care doctor and from previous ED visits. Patient requesting pain medication and anti-emetic scripts, again advised to use prescriptions already provided to her and to discuss further pain management with her PCP given chronicity of symptoms. Patient is stable at time of discharge.  Patient d/w with Dr. Randal Buba, agrees with plan.      Harlow Mares, PA-C 10/19/13 [phone]

## 2013-10-19 NOTE — Discharge Instructions (Signed)
Please follow up with your primary care doctor to discuss your recent visits to the ER. Please also discuss further pain management with your primary care doctor. Please use your at home Phenergan suppositories and pain medication to help control your symptoms. Please read all discharge instructions and return precautions.    Nausea and Vomiting Nausea is a sick feeling that often comes before throwing up (vomiting). Vomiting is a reflex where stomach contents come out of your mouth. Vomiting can cause severe loss of body fluids (dehydration). Children and elderly adults can become dehydrated quickly, especially if they also have diarrhea. Nausea and vomiting are symptoms of a condition or disease. It is important to find the cause of your symptoms. CAUSES   Direct irritation of the stomach lining. This irritation can result from increased acid production (gastroesophageal reflux disease), infection, food poisoning, taking certain medicines (such as nonsteroidal anti-inflammatory drugs), alcohol use, or tobacco use.  Signals from the brain.These signals could be caused by a headache, heat exposure, an inner ear disturbance, increased pressure in the brain from injury, infection, a tumor, or a concussion, pain, emotional stimulus, or metabolic problems.  An obstruction in the gastrointestinal tract (bowel obstruction).  Illnesses such as diabetes, hepatitis, gallbladder problems, appendicitis, kidney problems, cancer, sepsis, atypical symptoms of a heart attack, or eating disorders.  Medical treatments such as chemotherapy and radiation.  Receiving medicine that makes you sleep (general anesthetic) during surgery. DIAGNOSIS Your caregiver may ask for tests to be done if the problems do not improve after a few days. Tests may also be done if symptoms are severe or if the reason for the nausea and vomiting is not clear. Tests may include:  Urine tests.  Blood tests.  Stool tests.  Cultures  (to look for evidence of infection).  X-rays or other imaging studies. Test results can help your caregiver make decisions about treatment or the need for additional tests. TREATMENT You need to stay well hydrated. Drink frequently but in small amounts.You may wish to drink water, sports drinks, clear broth, or eat frozen ice pops or gelatin dessert to help stay hydrated.When you eat, eating slowly may help prevent nausea.There are also some antinausea medicines that may help prevent nausea. HOME CARE INSTRUCTIONS   Take all medicine as directed by your caregiver.  If you do not have an appetite, do not force yourself to eat. However, you must continue to drink fluids.  If you have an appetite, eat a normal diet unless your caregiver tells you differently.  Eat a variety of complex carbohydrates (rice, wheat, potatoes, bread), lean meats, yogurt, fruits, and vegetables.  Avoid high-fat foods because they are more difficult to digest.  Drink enough water and fluids to keep your urine clear or pale yellow.  If you are dehydrated, ask your caregiver for specific rehydration instructions. Signs of dehydration may include:  Severe thirst.  Dry lips and mouth.  Dizziness.  Dark urine.  Decreasing urine frequency and amount.  Confusion.  Rapid breathing or pulse. SEEK IMMEDIATE MEDICAL CARE IF:   You have blood or brown flecks (like coffee grounds) in your vomit.  You have black or bloody stools.  You have a severe headache or stiff neck.  You are confused.  You have severe abdominal pain.  You have chest pain or trouble breathing.  You do not urinate at least once every 8 hours.  You develop cold or clammy skin.  You continue to vomit for longer than 24 to 48 hours.  You have a fever. MAKE SURE YOU:   Understand these instructions.  Will watch your condition.  Will get help right away if you are not doing well or get worse. Document Released: 06/11/2005  Document Revised: 09/03/2011 Document Reviewed: 11/08/2010 [facility] Patient Information 2014 [place], [place].  Chronic Pain Discharge Instructions  Emergency care providers appreciate that many patients coming to [place] are in severe pain and we wish to address their pain in the safest, most responsible manner.  It is important to recognize however, that the proper treatment of chronic pain differs from that of the pain of injuries and acute illnesses.  Our goal is to provide quality, safe, personalized care and we thank you for giving [place] the opportunity to serve you. The use of narcotics and related agents for chronic pain syndromes may lead to additional physical and psychological problems.  Nearly as many people die from prescription narcotics each year as die from car crashes.  Additionally, this risk is increased if such prescriptions are obtained from a variety of sources.  Therefore, only your primary care physician or a pain management specialist is able to safely treat such syndromes with narcotic medications long-term.    Documentation revealing such prescriptions have been sought from multiple sources may prohibit [place] from providing a refill or different narcotic medication.  Your name may be checked first through the [place].  This database is a record of controlled substance medication prescriptions that the patient has received.  This has been established by [facility] in an effort to eliminate the dangerous, and often life threatening, practice of obtaining multiple prescriptions from different medical providers.   If you have a chronic pain syndrome (i.e. chronic headaches, recurrent back or neck pain, dental pain, abdominal or pelvis pain without a specific diagnosis, or neuropathic pain such as fibromyalgia) or recurrent visits for the same condition without an acute diagnosis, you may be treated with non-narcotics and other non-addictive  medicines.  Allergic reactions or negative side effects that may be reported by a patient to such medications will not typically lead to the use of a narcotic analgesic or other controlled substance as an alternative.   Patients managing chronic pain with a personal physician should have provisions in place for breakthrough pain.  If you are in crisis, you should call your physician.  If your physician directs you to the emergency department, please have the doctor call and speak to our attending physician concerning your care.   When patients come to the Emergency Department (ED) with acute medical conditions in which the Emergency Department physician feels appropriate to prescribe narcotic or sedating pain medication, the physician will prescribe these in very limited quantities.  The amount of these medications will last only until you can see your primary care physician in his/her office.  Any patient who returns to the ED seeking refills should expect only non-narcotic pain medications.   In the event of an acute medical condition exists and the emergency physician feels it is necessary that the patient be given a narcotic or sedating medication -  a responsible adult driver should be present in the room prior to the medication being given by the nurse.   Prescriptions for narcotic or sedating medications that have been lost, stolen or expired will not be refilled in the Emergency Department.    Patients who have chronic pain may receive non-narcotic prescriptions until seen by their primary care physician.  It is every patients personal  responsibility to maintain active prescriptions with his or her primary care physician or specialist.

## 2013-10-19 NOTE — ED Notes (Signed)
Pt has ambulated to the nurses station 4 times in less than 20mins asking about medication. Pt has been told that medication is coming from pharmacy and will be administered as soon as they are received. Pt is upset that she will not be receiving an IV, and is requesting to speak with provider. Pt notified provider will be in as soon as she can. Pt states she does not want medication by mouth due to her nausea.

## 2013-10-19 NOTE — ED Notes (Signed)
Pt is here for vomiting and nausea as well as stomach pain. Pt states she was at [facility] last night for same. Pt takes percocet for her pain but states it is not working. Pt also states she has phenergan but has not taken any because she ran out of medication.

## 2013-10-19 NOTE — ED Notes (Signed)
Pt continues to come to nurses station requesting to speak with provider.

## 2013-10-19 NOTE — ED Notes (Signed)
Discharge instructions reviewed with pt. Pt verbalized understanding.   

## 2013-10-19 NOTE — ED Provider Notes (Signed)
Medical screening examination/treatment/procedure(s) were performed by non-physician practitioner and as supervising physician I was immediately available for consultation/collaboration.   EKG Interpretation None       Kimble Hitchens K Mauriana Dann-Rasch, MD 10/19/13 2349

## 2013-10-19 NOTE — ED Notes (Signed)
Baker Janus NP at bedside speaking with pt.

## 2013-10-19 NOTE — ED Provider Notes (Signed)
CSN: [id number]     Arrival date & time 10/19/13  0426 History   First MD Initiated Contact with Patient 10/19/13 [phone]     Chief Complaint  Patient presents with  . Nausea  . Emesis     (Consider location/radiation/quality/duration/timing/severity/associated sxs/prior Treatment) HPI Comments: Patient with chronic abdominal pain nausea seen 6 hours ago at [facility] after extensive workup IV fluids and able to tolerate fluids   On arrival home again developed Nausea and pain DID NOT take any of her meds stating she did not have any  Including Phenergan suppositories and PO Percocet.   Patient is a 38 y.o. female presenting with vomiting. The history is provided by the patient.  Emesis Severity:  Unable to specify Timing:  Intermittent Quality:  Unable to specify Progression:  Worsening Chronicity:  Recurrent Associated symptoms: abdominal pain   Associated symptoms: no diarrhea, no fever and no myalgias   Risk factors: diabetes     Past Medical History  Diagnosis Date  . Diabetes mellitus   . Headache(784.0)   . Seasonal allergies   . Abnormal Pap smear   . Gout   . Depression   . Anxiety   . UTI (urinary tract infection)   . Bipolar 1 disorder   . Kidney calculi   . Anemia    Past Surgical History  Procedure Laterality Date  . Cholecystectomy    . Esophagogastroduodenoscopy N/A 09/07/2012    Procedure: ESOPHAGOGASTRODUODENOSCOPY (EGD);  Surgeon: Juanita Craver, MD;  Location: [facility] ENDOSCOPY;  Service: Endoscopy;  Laterality: N/A;  . Cesarean section     Family History  Problem Relation Age of Onset  . Hypertension Mother   . Diabetes Mother   . Hypertension Maternal Aunt   . Diabetes Maternal Aunt   . Hypertension Maternal Uncle   . Diabetes Maternal Uncle   . Hypertension Maternal Grandmother    History  Substance Use Topics  . Smoking status: Current Every Day Smoker -- 0.50 packs/day    Types: Cigarettes  . Smokeless tobacco: Not on file   . Alcohol Use: No   OB History   Grav Para Term Preterm Abortions TAB SAB Ect Mult Living   '8 8 7 1 ' 0  0   7     Review of Systems  Gastrointestinal: Positive for nausea, vomiting and abdominal pain. Negative for diarrhea.  Musculoskeletal: Negative for myalgias.  All other systems reviewed and are negative.     Allergies  Reglan; Reglan; and Zofran  Home Medications   Prior to Admission medications   Medication Sig Start Date End Date Taking? Authorizing Provider  alprazolam Duanne Moron) 2 MG tablet Take 2 mg by mouth 3 (three) times daily as needed for sleep or anxiety.    Historical Provider, MD  amphetamine-dextroamphetamine (ADDERALL) 30 MG tablet Take 30 mg by mouth 2 (two) times daily. Take 30 mg by mouth 2 times daily.    Historical Provider, MD  dicyclomine (BENTYL) 20 MG tablet Take 1 tablet (20 mg total) by mouth every 6 (six) hours as needed (for abdominal cramping). 04/06/13   Kalman Drape, MD  famotidine (PEPCID) 20 MG tablet Take 1 tablet (20 mg total) by mouth daily. 03/12/13   Robbie Lis, MD  glipiZIDE (GLUCOTROL) 5 MG tablet Take 1 tablet (5 mg total) by mouth 2 (two) times daily before a meal. 04/13/13   Robbie Lis, MD  glucose monitoring kit (FREESTYLE) monitoring kit 1 each by Does not  apply route as needed for other. Please test strips for ac and hs checks 05/07/13   Jonetta Osgood, MD  HYDROcodone-acetaminophen (NORCO/VICODIN) 5-325 MG per tablet Take 2 tablets by mouth every 4 (four) hours as needed for pain. 04/13/13   Robbie Lis, MD  insulin aspart (NOVOLOG) 100 UNIT/ML injection Inject 10-20 Units into the skin 3 (three) times daily before meals. Sliding scale 05/07/13   Jonetta Osgood, MD  Insulin Glargine (LANTUS SOLOSTAR) 100 UNIT/ML SOPN Inject 80 Units into the skin at bedtime. 05/07/13   Shanker Kristeen Mans, MD  naproxen (NAPROSYN) 500 MG tablet Take 1 tablet (500 mg total) by mouth 2 (two) times daily with a meal. 04/04/13   Johnna Acosta, MD   oxyCODONE-acetaminophen (PERCOCET) 10-325 MG per tablet Take 1 tablet by mouth every 6 (six) hours as needed for pain. 03/12/13   Robbie Lis, MD  promethazine (PHENERGAN) 25 MG suppository Place 1 suppository (25 mg total) rectally every 6 (six) hours as needed for nausea. 04/04/13   Johnna Acosta, MD  promethazine (PHENERGAN) 25 MG tablet Take 25 mg by mouth every 4 (four) hours as needed for nausea.    Historical Provider, MD  QUEtiapine (SEROQUEL) 300 MG tablet Take 1 tablet (300 mg total) by mouth 2 (two) times daily. Take 300 mg by mouth 2 times daily. 05/07/13   Shanker Kristeen Mans, MD  saccharomyces boulardii (FLORASTOR) 250 MG capsule Take 1 capsule (250 mg total) by mouth 2 (two) times daily. 03/12/13   Robbie Lis, MD  sitaGLIPtin (JANUVIA) 50 MG tablet Take 1 tablet (50 mg total) by mouth 2 (two) times daily. Take 50 mg by mouth 2 times daily. 03/12/13   Robbie Lis, MD  zolpidem (AMBIEN) 5 MG tablet Take 1 tablet (5 mg total) by mouth at bedtime as needed for sleep (insomnia). 03/12/13   Robbie Lis, MD   BP 160/53  Pulse 81  Temp(Src) 98.5 F (36.9 C) (Oral)  Resp 16  SpO2 100% Physical Exam  Constitutional: She appears well-developed and well-nourished.  HENT:  Head: Normocephalic.  Mouth/Throat: Uvula is midline and mucous membranes are normal. Oral lesions present.  Eyes: Pupils are equal, round, and reactive to light.  Cardiovascular: Normal rate and regular rhythm.   Pulmonary/Chest: Effort normal.  Abdominal: She exhibits no distension.  Musculoskeletal: Normal range of motion. She exhibits no edema and no tenderness.  Neurological: She is alert.  Skin: Skin is warm and dry. There is pallor.    ED Course  Procedures (including critical care time) Labs Review Labs Reviewed  CBG MONITORING, ED - Abnormal; Notable for the following:    Glucose-Capillary 338 (*)    All other components within normal limits    Imaging Review No results found.   EKG  Interpretation None      MDM  Patient repeatedly coming out of room requesting pain medication, stating she's on the she still nauseated,she needs more medicine she needs an IV etc Will give IM Haldol 2 mg and IM Toradol 30 mg  Final diagnoses:  Nausea & vomiting        Garald Balding, NP 10/20/13 2205

## 2013-10-20 NOTE — ED Provider Notes (Signed)
Medical screening examination/treatment/procedure(s) were performed by non-physician practitioner and as supervising physician I was immediately available for consultation/collaboration.   EKG Interpretation None       Sharisse Rantz K Rolanda Campa-Rasch, MD 10/20/13 2355

## 2013-10-20 NOTE — ED Provider Notes (Signed)
Medical screening examination/treatment/procedure(s) were conducted as a shared visit with non-physician practitioner(s) and myself.  I personally evaluated the patient during the encounter.   EKG Interpretation None       Orlie Dakin, MD 10/20/13 0005

## 2013-12-07 ENCOUNTER — Emergency Department (HOSPITAL_COMMUNITY)
Admission: EM | Admit: 2013-12-07 | Discharge: 2013-12-07 | Disposition: A | Discharge status: Home / Self Care | Payer: PRIVATE HEALTH INSURANCE

## 2013-12-07 DIAGNOSIS — E119 Type 2 diabetes mellitus without complications: Secondary | ICD-10-CM | POA: Insufficient documentation

## 2013-12-07 DIAGNOSIS — F3289 Other specified depressive episodes: Secondary | ICD-10-CM | POA: Insufficient documentation

## 2013-12-07 DIAGNOSIS — F17200 Nicotine dependence, unspecified, uncomplicated: Secondary | ICD-10-CM | POA: Insufficient documentation

## 2013-12-07 DIAGNOSIS — Z87440 Personal history of urinary (tract) infections: Secondary | ICD-10-CM | POA: Insufficient documentation

## 2013-12-07 DIAGNOSIS — F329 Major depressive disorder, single episode, unspecified: Secondary | ICD-10-CM | POA: Insufficient documentation

## 2013-12-07 DIAGNOSIS — Z794 Long term (current) use of insulin: Secondary | ICD-10-CM | POA: Insufficient documentation

## 2013-12-07 DIAGNOSIS — Z87442 Personal history of urinary calculi: Secondary | ICD-10-CM | POA: Insufficient documentation

## 2013-12-07 DIAGNOSIS — Z79899 Other long term (current) drug therapy: Secondary | ICD-10-CM | POA: Insufficient documentation

## 2013-12-07 DIAGNOSIS — R112 Nausea with vomiting, unspecified: Secondary | ICD-10-CM | POA: Insufficient documentation

## 2013-12-07 DIAGNOSIS — Z862 Personal history of diseases of the blood and blood-forming organs and certain disorders involving the immune mechanism: Secondary | ICD-10-CM | POA: Insufficient documentation

## 2013-12-07 DIAGNOSIS — R197 Diarrhea, unspecified: Secondary | ICD-10-CM | POA: Insufficient documentation

## 2013-12-07 DIAGNOSIS — F411 Generalized anxiety disorder: Secondary | ICD-10-CM | POA: Insufficient documentation

## 2013-12-07 DIAGNOSIS — R1084 Generalized abdominal pain: Secondary | ICD-10-CM | POA: Insufficient documentation

## 2013-12-08 ENCOUNTER — Encounter (HOSPITAL_COMMUNITY): Payer: Self-pay | Admitting: Emergency Medicine

## 2013-12-08 ENCOUNTER — Emergency Department (HOSPITAL_COMMUNITY)
Admission: EM | Admit: 2013-12-08 | Discharge: 2013-12-08 | Disposition: A | Discharge status: Home / Self Care | Payer: PRIVATE HEALTH INSURANCE | Attending: Emergency Medicine | Admitting: Emergency Medicine

## 2013-12-08 DIAGNOSIS — R197 Diarrhea, unspecified: Secondary | ICD-10-CM

## 2013-12-08 DIAGNOSIS — R112 Nausea with vomiting, unspecified: Secondary | ICD-10-CM

## 2013-12-08 DIAGNOSIS — R109 Unspecified abdominal pain: Secondary | ICD-10-CM

## 2013-12-08 LAB — CBC WITH DIFFERENTIAL/PLATELET
Basophils Absolute: 0.1 10*3/uL (ref 0.0–0.1)
Basophils Relative: 1 % (ref 0–1)
EOS PCT: 0 % (ref 0–5)
Eosinophils Absolute: 0 10*3/uL (ref 0.0–0.7)
HCT: 34.6 % — ABNORMAL LOW (ref 36.0–46.0)
Hemoglobin: 10.9 g/dL — ABNORMAL LOW (ref 12.0–15.0)
Lymphocytes Relative: 10 % — ABNORMAL LOW (ref 12–46)
Lymphs Abs: 0.8 10*3/uL (ref 0.7–4.0)
MCH: 21.1 pg — ABNORMAL LOW (ref 26.0–34.0)
MCHC: 31.5 g/dL (ref 30.0–36.0)
MCV: 67.1 fL — AB (ref 78.0–100.0)
MONOS PCT: 3 % (ref 3–12)
Monocytes Absolute: 0.2 10*3/uL (ref 0.1–1.0)
NEUTROS PCT: 86 % — AB (ref 43–77)
Neutro Abs: 7.1 10*3/uL (ref 1.7–7.7)
PLATELETS: 324 10*3/uL (ref 150–400)
RBC: 5.16 MIL/uL — AB (ref 3.87–5.11)
RDW: 20.8 % — AB (ref 11.5–15.5)
WBC: 8.2 10*3/uL (ref 4.0–10.5)

## 2013-12-08 LAB — COMPREHENSIVE METABOLIC PANEL
ALT: 7 U/L (ref 0–35)
AST: 14 U/L (ref 0–37)
Albumin: 4.2 g/dL (ref 3.5–5.2)
Alkaline Phosphatase: 90 U/L (ref 39–117)
BILIRUBIN TOTAL: 0.3 mg/dL (ref 0.3–1.2)
BUN: 11 mg/dL (ref 6–23)
CALCIUM: 9.8 mg/dL (ref 8.4–10.5)
CHLORIDE: 97 meq/L (ref 96–112)
CO2: 22 mEq/L (ref 19–32)
CREATININE: 0.4 mg/dL — AB (ref 0.50–1.10)
GFR calc Af Amer: >90 mL/min (ref >90)
GFR calc non Af Amer: >90 mL/min (ref >90)
Glucose, Bld: 338 mg/dL — ABNORMAL HIGH (ref 70–99)
Potassium: 4.1 mEq/L (ref 3.7–5.3)
SODIUM: 138 meq/L (ref 137–147)
Total Protein: 8.9 g/dL — ABNORMAL HIGH (ref 6.0–8.3)

## 2013-12-08 LAB — URINALYSIS, ROUTINE W REFLEX MICROSCOPIC
Bilirubin Urine: NEGATIVE
Glucose, UA: >1000 mg/dL — AB
Ketones, ur: >80 mg/dL — AB
LEUKOCYTES UA: NEGATIVE
NITRITE: NEGATIVE
SPECIFIC GRAVITY, URINE: 1.042 — AB (ref 1.005–1.030)
UROBILINOGEN UA: 0.2 mg/dL (ref 0.0–1.0)
pH: 6.5 (ref 5.0–8.0)

## 2013-12-08 LAB — CBG MONITORING, ED: Glucose-Capillary: 351 mg/dL — ABNORMAL HIGH (ref 70–99)

## 2013-12-08 LAB — URINE MICROSCOPIC-ADD ON

## 2013-12-08 MED ORDER — SODIUM CHLORIDE 0.9 % IV SOLN: 1000.0000 mL | INTRAVENOUS | Status: DC

## 2013-12-08 MED ORDER — SODIUM CHLORIDE 0.9 % IV SOLN: 1000.0000 mL | Freq: Once | INTRAVENOUS | Status: DC

## 2013-12-08 MED ORDER — DIPHENHYDRAMINE HCL 50 MG/ML IJ SOLN
12.5000 mg | Freq: Once | INTRAMUSCULAR | Status: AC
  Administered 2013-12-08: 12.5 mg via INTRAVENOUS
  Filled 2013-12-08: qty 1

## 2013-12-08 MED ORDER — PROCHLORPERAZINE EDISYLATE 5 MG/ML IJ SOLN
10.0000 mg | Freq: Once | INTRAMUSCULAR | Status: AC
  Administered 2013-12-08: 10 mg via INTRAVENOUS
  Filled 2013-12-08: qty 2

## 2013-12-08 MED ORDER — SODIUM CHLORIDE 0.9 % IV SOLN
1000.0000 mL | Freq: Once | INTRAVENOUS | Status: AC
  Administered 2013-12-08: 1000 mL via INTRAVENOUS

## 2013-12-08 MED ORDER — MORPHINE SULFATE 4 MG/ML IJ SOLN: 4.0000 mg | Freq: Once | INTRAMUSCULAR | Status: DC

## 2013-12-08 MED ORDER — MORPHINE SULFATE 4 MG/ML IJ SOLN
6.0000 mg | Freq: Once | INTRAMUSCULAR | Status: AC
  Administered 2013-12-08: 6 mg via INTRAVENOUS
  Filled 2013-12-08: qty 2

## 2013-12-08 MED ORDER — OXYCODONE-ACETAMINOPHEN 5-325 MG PO TABS
2.0000 | ORAL_TABLET | Freq: Once | ORAL | Status: AC
  Administered 2013-12-08: 2 via ORAL
  Filled 2013-12-08: qty 2

## 2013-12-08 NOTE — ED Notes (Signed)
I/v D/c

## 2013-12-08 NOTE — ED Provider Notes (Signed)
CSN: [id number]     Arrival date & time 12/07/13  2357 History   First MD Initiated Contact with Patient 12/08/13 [phone]     Chief Complaint  Patient presents with  . Emesis     (Consider location/radiation/quality/duration/timing/severity/associated sxs/prior Treatment) HPI Pt is a 38yo female with hx of diabetes mellitus, depression, anxiety, bipolar disorder, renal stones and anemia presenting to ED c/o abdominal pain associated with n/v/d that started yesterday. Pt states she has vomited "seventy" time since yesterday and has had unknown amount of diarrhea. Denies blood or mucous in stool. Pt states she also had a temp of 103 yesterday but has been taking acetaminophen and ibuprofen. Abdominal pain is sharp, cramping, and diffuse, 10/10.  States she has taken percocet w/o relief as she keeps vomiting up the medication.  Pt initially stated she has taken PO and suppository phenergan w/o relief, however, then stated she has not taken any phenergan for a few days.  Denies sick contacts or recent travel. Pt states she has sickle cell disease, however, medical records say pt does not.  Abdominal surgical hx significant for cholecystectomy, esophagogastroduodenoscopy and cesarean section.  Denies urinary or vaginal symptoms. States she is just coming off of her menstrual cycle.    Past Medical History  Diagnosis Date  . Diabetes mellitus   . Headache(784.0)   . Seasonal allergies   . Abnormal Pap smear   . Gout   . Depression   . Anxiety   . UTI (urinary tract infection)   . Bipolar 1 disorder   . Kidney calculi   . Anemia    Past Surgical History  Procedure Laterality Date  . Cholecystectomy    . Esophagogastroduodenoscopy N/A 09/07/2012    Procedure: ESOPHAGOGASTRODUODENOSCOPY (EGD);  Surgeon: Juanita Craver, MD;  Location: [facility] ENDOSCOPY;  Service: Endoscopy;  Laterality: N/A;  . Cesarean section     Family History  Problem Relation Age of Onset  . Hypertension Mother   . Diabetes  Mother   . Hypertension Maternal Aunt   . Diabetes Maternal Aunt   . Hypertension Maternal Uncle   . Diabetes Maternal Uncle   . Hypertension Maternal Grandmother    History  Substance Use Topics  . Smoking status: Current Every Day Smoker -- 0.50 packs/day    Types: Cigarettes  . Smokeless tobacco: Not on file  . Alcohol Use: No   OB History   Grav Para Term Preterm Abortions TAB SAB Ect Mult Living   8 8 7 1  0  0   7     Review of Systems  Constitutional: Positive for fever and appetite change. Negative for chills.  Respiratory: Negative for cough and shortness of breath.   Cardiovascular: Negative for chest pain and palpitations.  Gastrointestinal: Positive for nausea, vomiting, abdominal pain and diarrhea. Negative for constipation and blood in stool.  All other systems reviewed and are negative.     Allergies  Reglan; Reglan; and Zofran  Home Medications   Prior to Admission medications   Medication Sig Start Date End Date Taking? Authorizing Provider  alprazolam Duanne Moron) 2 MG tablet Take 2 mg by mouth 3 (three) times daily as needed for sleep or anxiety.   Yes Historical Provider, MD  amphetamine-dextroamphetamine (ADDERALL) 30 MG tablet Take 30 mg by mouth 2 (two) times daily. Take 30 mg by mouth 2 times daily.   Yes Historical Provider, MD  insulin aspart (NOVOLOG) 100 UNIT/ML injection Inject 10-20 Units into the skin 3 (three) times daily before  meals. Sliding scale 05/07/13  Yes Shanker Kristeen Mans, MD  Insulin Glargine (LANTUS SOLOSTAR) 100 UNIT/ML SOPN Inject 80 Units into the skin at bedtime. 05/07/13  Yes Shanker Kristeen Mans, MD  oxyCODONE-acetaminophen (PERCOCET) 10-325 MG per tablet Take 1 tablet by mouth every 6 (six) hours as needed for pain. 03/12/13  Yes Robbie Lis, MD   BP 177/74  Pulse 102  Temp(Src) 98.9 F (37.2 C) (Oral)  Resp 20  SpO2 100% Physical Exam  Nursing note and vitals reviewed. Constitutional: She appears well-developed and  well-nourished. No distress.  Pt lying in exam bed, talking to friend. NAD  HENT:  Head: Normocephalic and atraumatic.  Mouth/Throat: Mucous membranes are normal.  Eyes: Conjunctivae are normal. No scleral icterus.  Neck: Normal range of motion.  Cardiovascular: Normal rate, regular rhythm and normal heart sounds.   Pulmonary/Chest: Effort normal and breath sounds normal. No respiratory distress. She has no wheezes. She has no rales. She exhibits no tenderness.  Abdominal: Soft. Bowel sounds are normal. She exhibits no distension and no mass. There is tenderness. There is no rebound and no guarding.  Soft, non-distended, diffuse tenderness. No CVAT  Musculoskeletal: Normal range of motion.  Neurological: She is alert.  Skin: Skin is warm and dry. She is not diaphoretic.    ED Course  Procedures (including critical care time) Labs Review Labs Reviewed  URINALYSIS, ROUTINE W REFLEX MICROSCOPIC - Abnormal; Notable for the following:    Specific Gravity, Urine 1.042 (*)    Glucose, UA >1000 (*)    Hgb urine dipstick MODERATE (*)    Ketones, ur >80 (*)    Protein, ur >300 (*)    All other components within normal limits  CBC WITH DIFFERENTIAL - Abnormal; Notable for the following:    RBC 5.16 (*)    Hemoglobin 10.9 (*)    HCT 34.6 (*)    MCV 67.1 (*)    MCH 21.1 (*)    RDW 20.8 (*)    Neutrophils Relative % 86 (*)    Lymphocytes Relative 10 (*)    All other components within normal limits  COMPREHENSIVE METABOLIC PANEL - Abnormal; Notable for the following:    Glucose, Bld 338 (*)    Creatinine, Ser 0.40 (*)    Total Protein 8.9 (*)    All other components within normal limits  CBG MONITORING, ED - Abnormal; Notable for the following:    Glucose-Capillary 351 (*)    All other components within normal limits  URINE MICROSCOPIC-ADD ON  CBG MONITORING, ED    Imaging Review No results found.   EKG Interpretation None      MDM   Final diagnoses:  Abdominal pain   Nausea vomiting and diarrhea    pt is a 38yo female c/o abdominal pain, n/v/d that started yesterday.  While pt was waiting to be seen, pt walked out of her room multiple times and would not stay connected to the monitor.  Pt reports vomiting "seventy" times since yesterday, however electrolytes do not reflect this. Pt also appears well hydrated with moist mucous membranes.  Pt stated she had initially taken PO and suppository phenergan w/o relief and requested IV phenergan.  When advised she would be given another antinausea medication because phenergan was not helping, pt then stated she had not taken it for several days.    Not concerned for emergent process taking place at this time, including SBO, appendicitis, or perforated bowl as vitals are unremarkable, pt is afebrile.  Abd- soft, non-distended, diffuse tenderness, no point tenderness, rebound, guarding or masses.  Labs: CBC-unremarkable. CMP-elevated glucose. UA-significant for proteinuria and ketones.     Will give pt IV fluids, compazine, benadryl, and morphine then reevaluate.   Pt stated pain did improve some, pt able to keep down PO fluids. Will give PO pain medication. Discussed pt with Dr. Sharol Given, will discharge pt home to f/u with PCP. No further workup indicated at this time, no indication of emergent process taking place.  Return precautions provided.    Noland Fordyce, PA-C 12/08/13 [phone]

## 2013-12-08 NOTE — ED Notes (Signed)
Pt c/o nausea, vomiting and mid to lower back pain, onset this am. Denies diarrhea.  Last BM earlier today.  Pt is diabetic.

## 2013-12-08 NOTE — ED Notes (Signed)
Pt continually disconnecting herself from monitor and walking in hallway and into doctors dictation lounge. Explained to pt the importance of staying in her room and attached to monitor. Pt also has her IV fluids laying on ground. Pt refusing to be reattached to monitor. Pt also asking for the zofran which she prior had said she is allergic to and refused.

## 2013-12-08 NOTE — ED Provider Notes (Signed)
Medical screening examination/treatment/procedure(s) were performed by non-physician practitioner and as supervising physician I was immediately available for consultation/collaboration.   EKG Interpretation None       Kalman Drape, MD 12/08/13 1735

## 2013-12-08 NOTE — Discharge Instructions (Signed)
Abdominal Pain, Women °Abdominal (stomach, pelvic, or belly) pain can be caused by many things. It is important to tell your doctor: °· The location of the pain. °· Does it come and go or is it present all the time? °· Are there things that start the pain (eating certain foods, exercise)? °· Are there other symptoms associated with the pain (fever, nausea, vomiting, diarrhea)? °All of this is helpful to know when trying to find the cause of the pain. °CAUSES  °· Stomach: virus or bacteria infection, or ulcer. °· Intestine: appendicitis (inflamed appendix), regional ileitis (Crohn's disease), ulcerative colitis (inflamed colon), irritable bowel syndrome, diverticulitis (inflamed diverticulum of the colon), or cancer of the stomach or intestine. °· Gallbladder disease or stones in the gallbladder. °· Kidney disease, kidney stones, or infection. °· Pancreas infection or cancer. °· Fibromyalgia (pain disorder). °· Diseases of the female organs: °· Uterus: fibroid (non-cancerous) tumors or infection. °· Fallopian tubes: infection or tubal pregnancy. °· Ovary: cysts or tumors. °· Pelvic adhesions (scar tissue). °· Endometriosis (uterus lining tissue growing in the pelvis and on the pelvic organs). °· Pelvic congestion syndrome (female organs filling up with blood just before the menstrual period). °· Pain with the menstrual period. °· Pain with ovulation (producing an egg). °· Pain with an IUD (intrauterine device, birth control) in the uterus. °· Cancer of the female organs. °· Functional pain (pain not caused by a disease, may improve without treatment). °· Psychological pain. °· Depression. °DIAGNOSIS  °Your doctor will decide the seriousness of your pain by doing an examination. °· Blood tests. °· X-rays. °· Ultrasound. °· CT scan (computed tomography, special type of X-ray). °· MRI (magnetic resonance imaging). °· Cultures, for infection. °· Barium enema (dye inserted in the large intestine, to better view it with  X-rays). °· Colonoscopy (looking in intestine with a lighted tube). °· Laparoscopy (minor surgery, looking in abdomen with a lighted tube). °· Major abdominal exploratory surgery (looking in abdomen with a large incision). °TREATMENT  °The treatment will depend on the cause of the pain.  °· Many cases can be observed and treated at home. °· Over-the-counter medicines recommended by your caregiver. °· Prescription medicine. °· Antibiotics, for infection. °· Birth control pills, for painful periods or for ovulation pain. °· Hormone treatment, for endometriosis. °· Nerve blocking injections. °· Physical therapy. °· Antidepressants. °· Counseling with a psychologist or psychiatrist. °· Minor or major surgery. °HOME CARE INSTRUCTIONS  °· Do not take laxatives, unless directed by your caregiver. °· Take over-the-counter pain medicine only if ordered by your caregiver. Do not take aspirin because it can cause an upset stomach or bleeding. °· Try a clear liquid diet (broth or water) as ordered by your caregiver. Slowly move to a bland diet, as tolerated, if the pain is related to the stomach or intestine. °· Have a thermometer and take your temperature several times a day, and record it. °· Bed rest and sleep, if it helps the pain. °· Avoid sexual intercourse, if it causes pain. °· Avoid stressful situations. °· Keep your follow-up appointments and tests, as your caregiver orders. °· If the pain does not go away with medicine or surgery, you may try: °· Acupuncture. °· Relaxation exercises (yoga, meditation). °· Group therapy. °· Counseling. °SEEK MEDICAL CARE IF:  °· You notice certain foods cause stomach pain. °· Your home care treatment is not helping your pain. °· You need stronger pain medicine. °· You want your IUD removed. °· You feel faint or   lightheaded. °· You develop nausea and vomiting. °· You develop a rash. °· You are having side effects or an allergy to your medicine. °SEEK IMMEDIATE MEDICAL CARE IF:  °· Your  pain does not go away or gets worse. °· You have a fever. °· Your pain is felt only in portions of the abdomen. The right side could possibly be appendicitis. The left lower portion of the abdomen could be colitis or diverticulitis. °· You are passing blood in your stools (bright red or black tarry stools, with or without vomiting). °· You have blood in your urine. °· You develop chills, with or without a fever. °· You pass out. °MAKE SURE YOU:  °· Understand these instructions. °· Will watch your condition. °· Will get help right away if you are not doing well or get worse. °Document Released: 04/08/2007 Document Revised: 09/03/2011 Document Reviewed: 04/28/2009 °[employer]® Patient Information ©2014 [employer]. ° °

## 2013-12-08 NOTE — ED Notes (Signed)
Pt states that she has kidney stones.

## 2014-02-24 ENCOUNTER — Other Ambulatory Visit (HOSPITAL_COMMUNITY): Payer: Self-pay | Admitting: Podiatry

## 2014-02-24 DIAGNOSIS — M869 Osteomyelitis, unspecified: Secondary | ICD-10-CM

## 2014-02-25 ENCOUNTER — Telehealth: Payer: Self-pay | Admitting: Cardiovascular Disease

## 2014-02-25 NOTE — Telephone Encounter (Signed)
Closed encounter °

## 2014-03-02 ENCOUNTER — Encounter (HOSPITAL_COMMUNITY): Admission: RE | Admit: 2014-03-02 | Payer: Medicare Other | Source: Ambulatory Visit

## 2014-03-02 ENCOUNTER — Ambulatory Visit (HOSPITAL_COMMUNITY): Payer: Medicare Other

## 2014-03-16 ENCOUNTER — Encounter (HOSPITAL_COMMUNITY): Payer: Self-pay | Admitting: Emergency Medicine

## 2014-03-16 ENCOUNTER — Emergency Department (HOSPITAL_COMMUNITY)
Admission: EM | Admit: 2014-03-16 | Discharge: 2014-03-17 | Disposition: A | Discharge status: Home / Self Care | Payer: Medicare Other | Attending: Emergency Medicine | Admitting: Emergency Medicine

## 2014-03-16 DIAGNOSIS — R197 Diarrhea, unspecified: Secondary | ICD-10-CM | POA: Diagnosis not present

## 2014-03-16 DIAGNOSIS — Z79899 Other long term (current) drug therapy: Secondary | ICD-10-CM | POA: Diagnosis not present

## 2014-03-16 DIAGNOSIS — R1013 Epigastric pain: Secondary | ICD-10-CM | POA: Insufficient documentation

## 2014-03-16 DIAGNOSIS — F411 Generalized anxiety disorder: Secondary | ICD-10-CM | POA: Diagnosis not present

## 2014-03-16 DIAGNOSIS — R112 Nausea with vomiting, unspecified: Secondary | ICD-10-CM | POA: Diagnosis not present

## 2014-03-16 DIAGNOSIS — F329 Major depressive disorder, single episode, unspecified: Secondary | ICD-10-CM | POA: Insufficient documentation

## 2014-03-16 DIAGNOSIS — Z794 Long term (current) use of insulin: Secondary | ICD-10-CM | POA: Diagnosis not present

## 2014-03-16 DIAGNOSIS — Z862 Personal history of diseases of the blood and blood-forming organs and certain disorders involving the immune mechanism: Secondary | ICD-10-CM | POA: Insufficient documentation

## 2014-03-16 DIAGNOSIS — Z87440 Personal history of urinary (tract) infections: Secondary | ICD-10-CM | POA: Insufficient documentation

## 2014-03-16 DIAGNOSIS — Z87442 Personal history of urinary calculi: Secondary | ICD-10-CM | POA: Diagnosis not present

## 2014-03-16 DIAGNOSIS — F17200 Nicotine dependence, unspecified, uncomplicated: Secondary | ICD-10-CM | POA: Insufficient documentation

## 2014-03-16 DIAGNOSIS — Z3202 Encounter for pregnancy test, result negative: Secondary | ICD-10-CM | POA: Diagnosis not present

## 2014-03-16 DIAGNOSIS — E119 Type 2 diabetes mellitus without complications: Secondary | ICD-10-CM | POA: Insufficient documentation

## 2014-03-16 DIAGNOSIS — F3289 Other specified depressive episodes: Secondary | ICD-10-CM | POA: Insufficient documentation

## 2014-03-16 DIAGNOSIS — R739 Hyperglycemia, unspecified: Secondary | ICD-10-CM

## 2014-03-16 LAB — CBC WITH DIFFERENTIAL/PLATELET
Basophils Absolute: 0 10*3/uL (ref 0.0–0.1)
Basophils Relative: 0 % (ref 0–1)
EOS PCT: 0 % (ref 0–5)
Eosinophils Absolute: 0 10*3/uL (ref 0.0–0.7)
HCT: 37 % (ref 36.0–46.0)
Hemoglobin: 11.6 g/dL — ABNORMAL LOW (ref 12.0–15.0)
LYMPHS ABS: 0.8 10*3/uL (ref 0.7–4.0)
Lymphocytes Relative: 5 % — ABNORMAL LOW (ref 12–46)
MCH: 21 pg — ABNORMAL LOW (ref 26.0–34.0)
MCHC: 31.4 g/dL (ref 30.0–36.0)
MCV: 66.9 fL — AB (ref 78.0–100.0)
Monocytes Absolute: 0.3 10*3/uL (ref 0.1–1.0)
Monocytes Relative: 2 % — ABNORMAL LOW (ref 3–12)
Neutro Abs: 14 10*3/uL — ABNORMAL HIGH (ref 1.7–7.7)
Neutrophils Relative %: 93 % — ABNORMAL HIGH (ref 43–77)
Platelets: 460 10*3/uL — ABNORMAL HIGH (ref 150–400)
RBC: 5.53 MIL/uL — ABNORMAL HIGH (ref 3.87–5.11)
RDW: 19.3 % — ABNORMAL HIGH (ref 11.5–15.5)
WBC: 15.1 10*3/uL — AB (ref 4.0–10.5)

## 2014-03-16 LAB — URINALYSIS, ROUTINE W REFLEX MICROSCOPIC
BILIRUBIN URINE: NEGATIVE
Glucose, UA: >1000 mg/dL — AB
KETONES UR: 40 mg/dL — AB
Leukocytes, UA: NEGATIVE
Nitrite: NEGATIVE
PH: 6 (ref 5.0–8.0)
Protein, ur: >300 mg/dL — AB
Specific Gravity, Urine: >1.046 — ABNORMAL HIGH (ref 1.005–1.030)
Urobilinogen, UA: 0.2 mg/dL (ref 0.0–1.0)

## 2014-03-16 LAB — URINE MICROSCOPIC-ADD ON

## 2014-03-16 LAB — COMPREHENSIVE METABOLIC PANEL
ALBUMIN: 4.3 g/dL (ref 3.5–5.2)
ALK PHOS: 102 U/L (ref 39–117)
ALT: 6 U/L (ref 0–35)
ANION GAP: 18 — AB (ref 5–15)
AST: 16 U/L (ref 0–37)
BILIRUBIN TOTAL: 0.5 mg/dL (ref 0.3–1.2)
BUN: 10 mg/dL (ref 6–23)
CHLORIDE: 95 meq/L — AB (ref 96–112)
CO2: 22 mEq/L (ref 19–32)
Calcium: 10 mg/dL (ref 8.4–10.5)
Creatinine, Ser: 0.42 mg/dL — ABNORMAL LOW (ref 0.50–1.10)
GFR calc Af Amer: >90 mL/min (ref >90)
GFR calc non Af Amer: >90 mL/min (ref >90)
Glucose, Bld: 411 mg/dL — ABNORMAL HIGH (ref 70–99)
POTASSIUM: 4.2 meq/L (ref 3.7–5.3)
Sodium: 135 mEq/L — ABNORMAL LOW (ref 137–147)
Total Protein: 9.3 g/dL — ABNORMAL HIGH (ref 6.0–8.3)

## 2014-03-16 MED ORDER — MORPHINE SULFATE 4 MG/ML IJ SOLN
4.0000 mg | Freq: Once | INTRAMUSCULAR | Status: AC
  Administered 2014-03-17: 4 mg via INTRAVENOUS
  Filled 2014-03-16: qty 1

## 2014-03-16 MED ORDER — INSULIN ASPART 100 UNIT/ML ~~LOC~~ SOLN
10.0000 [IU] | Freq: Once | SUBCUTANEOUS | Status: AC
  Administered 2014-03-17: 10 [IU] via SUBCUTANEOUS
  Filled 2014-03-16: qty 1

## 2014-03-16 MED ORDER — SODIUM CHLORIDE 0.9 % IV BOLUS (SEPSIS)
1000.0000 mL | Freq: Once | INTRAVENOUS | Status: AC
  Administered 2014-03-17: 1000 mL via INTRAVENOUS

## 2014-03-16 MED ORDER — PROMETHAZINE HCL 25 MG/ML IJ SOLN
25.0000 mg | Freq: Once | INTRAMUSCULAR | Status: AC
  Administered 2014-03-17: 25 mg via INTRAVENOUS
  Filled 2014-03-16: qty 1

## 2014-03-16 NOTE — ED Notes (Signed)
Per EMS pt is c/o left flank pain and is having nausea and vomiting  Pt's blood sugar 405  Saline lock started by EMS

## 2014-03-16 NOTE — ED Provider Notes (Signed)
CSN: [id number]     Arrival date & time 03/16/14  2059 History   First MD Initiated Contact with Patient 03/16/14 2255     Chief Complaint  Patient presents with  . Flank Pain  . Emesis  . Hyperglycemia     (Consider location/radiation/quality/duration/timing/severity/associated sxs/prior Treatment) HPI  [name] with a history of insulin-dependent diabetes, bipolar disorder, kidney stones who presents with nausea, vomiting, and abdominal pain. Patient reports onset of symptoms earlier today. She rates 10 out of 10 epigastric abdominal pain that is sharp and nonradiating. Nothing seems to make it better or worse. She reports "vomiting green stuff." She also reports nonbloody diarrhea.  Sugars at home have been between 4 and 500. She reports compliance with her. She denies any fevers or sick contacts. Does have a surgical history notable for cholecystectomy and C-section.  Of note, initial encounter with patient in hallway. She is currently in the hallway requesting to see the doctor. She is in no acute distress.  Past Medical History  Diagnosis Date  . Diabetes mellitus   . Headache(784.0)   . Seasonal allergies   . Abnormal Pap smear   . Gout   . Depression   . Anxiety   . UTI (urinary tract infection)   . Bipolar 1 disorder   . Kidney calculi   . Anemia    Past Surgical History  Procedure Laterality Date  . Cholecystectomy    . Esophagogastroduodenoscopy N/A 09/07/2012    Procedure: ESOPHAGOGASTRODUODENOSCOPY (EGD);  Surgeon: Juanita Craver, MD;  Location: [facility] ENDOSCOPY;  Service: Endoscopy;  Laterality: N/A;  . Cesarean section     Family History  Problem Relation Age of Onset  . Hypertension Mother   . Diabetes Mother   . Hypertension Maternal Aunt   . Diabetes Maternal Aunt   . Hypertension Maternal Uncle   . Diabetes Maternal Uncle   . Hypertension Maternal Grandmother    History  Substance Use Topics  . Smoking status: Current Every Day Smoker  -- 0.50 packs/day    Types: Cigarettes  . Smokeless tobacco: Not on file  . Alcohol Use: No   OB History   Grav Para Term Preterm Abortions TAB SAB Ect Mult Living   8 8 7 1  0  0   7     Review of Systems  Constitutional: Negative for fever.  Respiratory: Negative for cough, chest tightness and shortness of breath.   Cardiovascular: Negative for chest pain.  Gastrointestinal: Positive for nausea, vomiting, abdominal pain and diarrhea. Negative for blood in stool.  Genitourinary: Positive for dysuria. Negative for hematuria and flank pain.  Musculoskeletal: Negative for back pain.  Skin: Negative for rash.  Neurological: Negative for headaches.  All other systems reviewed and are negative.     Allergies  Reglan; Reglan; and Zofran  Home Medications   Prior to Admission medications   Medication Sig Start Date End Date Taking? Authorizing Provider  alprazolam Duanne Moron) 2 MG tablet Take 2 mg by mouth 3 (three) times daily as needed for sleep or anxiety.   Yes Historical Provider, MD  amphetamine-dextroamphetamine (ADDERALL) 30 MG tablet Take 30 mg by mouth 2 (two) times daily. Take 30 mg by mouth 2 times daily.   Yes Historical Provider, MD  insulin aspart (NOVOLOG) 100 UNIT/ML injection Inject 10-20 Units into the skin 3 (three) times daily before meals. Sliding scale 05/07/13  Yes Shanker Kristeen Mans, MD  Insulin Glargine (LANTUS SOLOSTAR) 100 UNIT/ML SOPN Inject 80 Units  into the skin at bedtime. 05/07/13  Yes Shanker Kristeen Mans, MD  oxyCODONE-acetaminophen (PERCOCET) 10-325 MG per tablet Take 1 tablet by mouth every 6 (six) hours as needed for pain. 03/12/13  Yes Robbie Lis, MD  promethazine (PHENERGAN) 25 MG tablet Take 1 tablet (25 mg total) by mouth every 6 (six) hours as needed for nausea or vomiting. 03/17/14   Merryl Hacker, MD   BP 172/82  Pulse 87  Temp(Src) 97.8 F (36.6 C) (Oral)  Resp 18  SpO2 99% Physical Exam  Nursing note and vitals  reviewed. Constitutional: She is oriented to person, place, and time. No distress.  Chronically ill-appearing  HENT:  Head: Normocephalic and atraumatic.  Mucous membranes dry  Neck: Neck supple.  Cardiovascular: Normal rate, regular rhythm and normal heart sounds.   No murmur heard. Pulmonary/Chest: Effort normal. No respiratory distress. She has no wheezes.  Abdominal: Soft. Bowel sounds are normal. There is tenderness. There is no rebound and no guarding.  Mild tenderness in the epigastrium without rebound or guarding  Musculoskeletal: She exhibits no edema.  Neurological: She is alert and oriented to person, place, and time.  Skin: Skin is warm and dry.  Psychiatric: She has a normal mood and affect.    ED Course  Procedures (including critical care time) Labs Review Labs Reviewed  CBC WITH DIFFERENTIAL - Abnormal; Notable for the following:    WBC 15.1 (*)    RBC 5.53 (*)    Hemoglobin 11.6 (*)    MCV 66.9 (*)    MCH 21.0 (*)    RDW 19.3 (*)    Platelets 460 (*)    Neutrophils Relative % 93 (*)    Lymphocytes Relative 5 (*)    Monocytes Relative 2 (*)    Neutro Abs 14.0 (*)    All other components within normal limits  COMPREHENSIVE METABOLIC PANEL - Abnormal; Notable for the following:    Sodium 135 (*)    Chloride 95 (*)    Glucose, Bld 411 (*)    Creatinine, Ser 0.42 (*)    Total Protein 9.3 (*)    Anion gap 18 (*)    All other components within normal limits  URINALYSIS, ROUTINE W REFLEX MICROSCOPIC - Abnormal; Notable for the following:    APPearance CLOUDY (*)    Specific Gravity, Urine >1.046 (*)    Glucose, UA >1000 (*)    Hgb urine dipstick SMALL (*)    Ketones, ur 40 (*)    Protein, ur >300 (*)    All other components within normal limits  URINE MICROSCOPIC-ADD ON - Abnormal; Notable for the following:    Squamous Epithelial / LPF MANY (*)    All other components within normal limits  BASIC METABOLIC PANEL - Abnormal; Notable for the following:     Glucose, Bld 304 (*)    Creatinine, Ser 0.45 (*)    All other components within normal limits  CBG MONITORING, ED - Abnormal; Notable for the following:    Glucose-Capillary 365 (*)    All other components within normal limits  PREGNANCY, URINE    Imaging Review Ct Abdomen Pelvis W Contrast  03/17/2014   CLINICAL DATA:  Left flank pain.  Emesis.  Hyperglycemia.  EXAM: CT ABDOMEN AND PELVIS WITH CONTRAST  TECHNIQUE: Multidetector CT imaging of the abdomen and pelvis was performed using the standard protocol following bolus administration of intravenous contrast.  CONTRAST:  155mL OMNIPAQUE IOHEXOL 300 MG/ML  SOLN  COMPARISON:  10/18/2013  FINDINGS: BODY WALL: Evidence of small papule along the left mons pubis. Lower abdominal wall scarring, likely correlating to history of cesarean section.  LOWER CHEST: Mild scarring along the lower right major fissure.  ABDOMEN/PELVIS:  Liver: No focal abnormality.  Biliary: Cholecystectomy. No evidence of biliary obstruction or stone.  Pancreas: Unremarkable.  Spleen: Unremarkable.  Adrenals: Unremarkable.  Kidneys and ureters: 9 mm stone in the interpolar right kidney. Punctate calculus in the lower pole right kidney. No hydronephrosis. No evidence of renal infection.  Bladder: Unremarkable.  Reproductive: 2.7 cm sub serosal fibroid from the uterine fundus. Intramural right cornual fibroid, measuring nearly 3 cm.  Bowel: No obstruction. Normal appendix.  Retroperitoneum: No mass or adenopathy.  Peritoneum: No ascites or pneumoperitoneum.  Vascular: No acute abnormality.  OSSEOUS: No acute abnormalities.  IMPRESSION: 1. No acute intra-abdominal findings. 2. Right nephrolithiasis. 3. Fibroid uterus.   Electronically Signed   By: Jorje Guild M.D.   On: 03/17/2014 02:13     EKG Interpretation None      MDM   Final diagnoses:  Non-intractable vomiting with nausea, vomiting of unspecified type  Hyperglycemia    Patient presents with vomiting and  hyperglycemia. He is up walking around the department and nontoxic-appearing.  Initial lab work notable for glucose of 411 with anion gap of 18. Patient was given fluids and insulin. Patient noted to have leukocytosis of 15. He does have mild tenderness on exam. Will obtain CT scan of the abdomen given leukocytosis.  Urinalysis without evidence of UTI. CT scan unremarkable.  During patient's ED course, she was noted by nursing staff to self induce vomiting. She continues to request IV pain and nausea medication. I discussed the patient that she would receive one more dose of pain and nausea medication during her second fluid bolus. Following that she will be transitioned to by mouth medication. Patient states "if you let me go, I'm just couldn't come back." Repeat BMP following hydration and insulin with of 15 and improved glucose. At this time workup is reassuring and no evidence of DKA. Patient was encouraged to continue home insulin as directed. Patient was also given GI followup.  On repeat exam, patient is resting comfortably.   After history, exam, and medical workup I feel the patient has been appropriately medically screened and is safe for discharge home. Pertinent diagnoses were discussed with the patient. Patient was given return precautions.     Merryl Hacker, MD 03/17/14 [phone]

## 2014-03-16 NOTE — ED Notes (Signed)
Pt found repeatedly found walking in hallway. Pt redirected by staff members to stay in room. Pt threatening to leave.

## 2014-03-16 NOTE — ED Notes (Signed)
Patient continues to wonder up and down the hall. I have offered comfort and warm blankets. Patient states, "I am going to leave and go to [facility] ED. Cause no one will fix my nausea."   Explained to patient multiple times that we can not give her anything for nausea because she is allergic to Zofran. Also, informed patient that if she were to leave for [facility] she wouldn't get seen any faster.  Patient refuses to listen and continues to wonder up and down the hallway to find nurse or doctor.

## 2014-03-17 ENCOUNTER — Encounter (HOSPITAL_COMMUNITY): Payer: Self-pay | Admitting: Emergency Medicine

## 2014-03-17 ENCOUNTER — Emergency Department (HOSPITAL_COMMUNITY)
Admission: EM | Admit: 2014-03-17 | Discharge: 2014-03-17 | Disposition: A | Discharge status: Home / Self Care | Payer: Medicare Other | Attending: Emergency Medicine | Admitting: Emergency Medicine

## 2014-03-17 ENCOUNTER — Emergency Department (HOSPITAL_COMMUNITY): Payer: Medicare Other

## 2014-03-17 ENCOUNTER — Emergency Department (HOSPITAL_COMMUNITY)
Admission: EM | Admit: 2014-03-17 | Discharge: 2014-03-17 | Discharge status: Against Medical Advice | Payer: Medicare Other

## 2014-03-17 DIAGNOSIS — F411 Generalized anxiety disorder: Secondary | ICD-10-CM | POA: Diagnosis not present

## 2014-03-17 DIAGNOSIS — Z79899 Other long term (current) drug therapy: Secondary | ICD-10-CM | POA: Insufficient documentation

## 2014-03-17 DIAGNOSIS — F329 Major depressive disorder, single episode, unspecified: Secondary | ICD-10-CM | POA: Insufficient documentation

## 2014-03-17 DIAGNOSIS — F3289 Other specified depressive episodes: Secondary | ICD-10-CM | POA: Diagnosis not present

## 2014-03-17 DIAGNOSIS — E119 Type 2 diabetes mellitus without complications: Secondary | ICD-10-CM | POA: Insufficient documentation

## 2014-03-17 DIAGNOSIS — Z794 Long term (current) use of insulin: Secondary | ICD-10-CM | POA: Insufficient documentation

## 2014-03-17 DIAGNOSIS — Z87442 Personal history of urinary calculi: Secondary | ICD-10-CM | POA: Diagnosis not present

## 2014-03-17 DIAGNOSIS — R1013 Epigastric pain: Secondary | ICD-10-CM | POA: Diagnosis not present

## 2014-03-17 DIAGNOSIS — R112 Nausea with vomiting, unspecified: Secondary | ICD-10-CM | POA: Diagnosis not present

## 2014-03-17 DIAGNOSIS — F17200 Nicotine dependence, unspecified, uncomplicated: Secondary | ICD-10-CM | POA: Diagnosis not present

## 2014-03-17 DIAGNOSIS — R109 Unspecified abdominal pain: Secondary | ICD-10-CM

## 2014-03-17 DIAGNOSIS — R739 Hyperglycemia, unspecified: Secondary | ICD-10-CM

## 2014-03-17 DIAGNOSIS — Z862 Personal history of diseases of the blood and blood-forming organs and certain disorders involving the immune mechanism: Secondary | ICD-10-CM | POA: Insufficient documentation

## 2014-03-17 DIAGNOSIS — Z87440 Personal history of urinary (tract) infections: Secondary | ICD-10-CM | POA: Insufficient documentation

## 2014-03-17 LAB — BASIC METABOLIC PANEL
ANION GAP: 15 (ref 5–15)
ANION GAP: 17 — AB (ref 5–15)
BUN: 10 mg/dL (ref 6–23)
BUN: 10 mg/dL (ref 6–23)
CALCIUM: 9.2 mg/dL (ref 8.4–10.5)
CO2: 23 meq/L (ref 19–32)
CO2: 23 mEq/L (ref 19–32)
Calcium: 9.5 mg/dL (ref 8.4–10.5)
Chloride: 100 mEq/L (ref 96–112)
Chloride: 94 mEq/L — ABNORMAL LOW (ref 96–112)
Creatinine, Ser: 0.45 mg/dL — ABNORMAL LOW (ref 0.50–1.10)
Creatinine, Ser: 0.37 mg/dL — ABNORMAL LOW (ref 0.50–1.10)
GFR calc Af Amer: >90 mL/min (ref >90)
Glucose, Bld: 304 mg/dL — ABNORMAL HIGH (ref 70–99)
Glucose, Bld: 355 mg/dL — ABNORMAL HIGH (ref 70–99)
Potassium: 4.3 mEq/L (ref 3.7–5.3)
Potassium: 3.9 mEq/L (ref 3.7–5.3)
SODIUM: 138 meq/L (ref 137–147)
SODIUM: 134 meq/L — AB (ref 137–147)

## 2014-03-17 LAB — CBG MONITORING, ED
GLUCOSE-CAPILLARY: 365 mg/dL — AB (ref 70–99)
GLUCOSE-CAPILLARY: 323 mg/dL — AB (ref 70–99)

## 2014-03-17 LAB — PREGNANCY, URINE: PREG TEST UR: NEGATIVE

## 2014-03-17 MED ORDER — SODIUM CHLORIDE 0.9 % IV BOLUS (SEPSIS)
1000.0000 mL | Freq: Once | INTRAVENOUS | Status: AC
  Administered 2014-03-17: 1000 mL via INTRAVENOUS

## 2014-03-17 MED ORDER — MORPHINE SULFATE 4 MG/ML IJ SOLN
4.0000 mg | Freq: Once | INTRAMUSCULAR | Status: AC
  Administered 2014-03-17: 4 mg via INTRAVENOUS
  Filled 2014-03-17: qty 1

## 2014-03-17 MED ORDER — SODIUM CHLORIDE 0.9 % IV SOLN: INTRAVENOUS | Status: DC

## 2014-03-17 MED ORDER — INSULIN ASPART 100 UNIT/ML ~~LOC~~ SOLN
10.0000 [IU] | Freq: Once | SUBCUTANEOUS | Status: AC
Start: 2014-03-17 — End: 2014-03-17
  Administered 2014-03-17: 10 [IU] via SUBCUTANEOUS
  Filled 2014-03-17: qty 1

## 2014-03-17 MED ORDER — PROMETHAZINE HCL 25 MG/ML IJ SOLN
25.0000 mg | Freq: Once | INTRAMUSCULAR | Status: AC
  Administered 2014-03-17: 25 mg via INTRAVENOUS
  Filled 2014-03-17: qty 1

## 2014-03-17 MED ORDER — PROMETHAZINE HCL 25 MG PO TABS
25.0000 mg | ORAL_TABLET | Freq: Once | ORAL | Status: AC
Start: 2014-03-17 — End: 2014-03-17
  Administered 2014-03-17: 25 mg via ORAL
  Filled 2014-03-17: qty 1

## 2014-03-17 MED ORDER — IOHEXOL 300 MG/ML  SOLN
100.0000 mL | Freq: Once | INTRAMUSCULAR | Status: AC | PRN
  Administered 2014-03-17: 100 mL via INTRAVENOUS

## 2014-03-17 MED ORDER — INSULIN ASPART 100 UNIT/ML ~~LOC~~ SOLN
6.0000 [IU] | Freq: Once | SUBCUTANEOUS | Status: AC
  Administered 2014-03-17: 6 [IU] via SUBCUTANEOUS
  Filled 2014-03-17: qty 1

## 2014-03-17 MED ORDER — IOHEXOL 300 MG/ML  SOLN
50.0000 mL | Freq: Once | INTRAMUSCULAR | Status: AC | PRN
  Administered 2014-03-17: 50 mL via ORAL

## 2014-03-17 MED ORDER — PROMETHAZINE HCL 25 MG PO TABS: 25.0000 mg | ORAL_TABLET | Freq: Four times a day (QID) | ORAL | Status: DC | PRN

## 2014-03-17 MED ORDER — LORAZEPAM 2 MG/ML IJ SOLN
1.0000 mg | Freq: Once | INTRAMUSCULAR | Status: AC
  Administered 2014-03-17: 1 mg via INTRAVENOUS
  Filled 2014-03-17: qty 1

## 2014-03-17 NOTE — ED Notes (Signed)
It should be noted that pt had checked in here POV about an hour ago, pt asked how long the wait was going to be and when registration told her that we could not give her a specific time, pt left, had her daughter drive her down the road, pulled over on the side of the road and called 911.

## 2014-03-17 NOTE — Discharge Instructions (Signed)
Hyperglycemia °Hyperglycemia occurs when the glucose (sugar) in your blood is too high. Hyperglycemia can happen for many reasons, but it most often happens to people who do not know they have diabetes or are not managing their diabetes properly.  °CAUSES  °Whether you have diabetes or not, there are other causes of hyperglycemia. Hyperglycemia can occur when you have diabetes, but it can also occur in other situations that you might not be as aware of, such as: °Diabetes °· If you have diabetes and are having problems controlling your blood glucose, hyperglycemia could occur because of some of the following reasons: °¨ Not following your meal plan. °¨ Not taking your diabetes medications or not taking it properly. °¨ Exercising less or doing less activity than you normally do. °¨ Being sick. °Pre-diabetes °· This cannot be ignored. Before people develop Type 2 diabetes, they almost always have "pre-diabetes." This is when your blood glucose levels are higher than normal, but not yet high enough to be diagnosed as diabetes. Research has shown that some long-term damage to the body, especially the heart and circulatory system, may already be occurring during pre-diabetes. If you take action to manage your blood glucose when you have pre-diabetes, you may delay or prevent Type 2 diabetes from developing. °Stress °· If you have diabetes, you may be "diet" controlled or on oral medications or insulin to control your diabetes. However, you may find that your blood glucose is higher than usual in the hospital whether you have diabetes or not. This is often referred to as "stress hyperglycemia." Stress can elevate your blood glucose. This happens because of hormones put out by the body during times of stress. If stress has been the cause of your high blood glucose, it can be followed regularly by your caregiver. That way he/she can make sure your hyperglycemia does not continue to get worse or progress to  diabetes. °Steroids °· Steroids are medications that act on the infection fighting system (immune system) to block inflammation or infection. One side effect can be a rise in blood glucose. Most people can produce enough extra insulin to allow for this rise, but for those who cannot, steroids make blood glucose levels go even higher. It is not unusual for steroid treatments to "uncover" diabetes that is developing. It is not always possible to determine if the hyperglycemia will go away after the steroids are stopped. A special blood test called an A1c is sometimes done to determine if your blood glucose was elevated before the steroids were started. °SYMPTOMS °· Thirsty. °· Frequent urination. °· Dry mouth. °· Blurred vision. °· Tired or fatigue. °· Weakness. °· Sleepy. °· Tingling in feet or leg. °DIAGNOSIS  °Diagnosis is made by monitoring blood glucose in one or all of the following ways: °· A1c test. This is a chemical found in your blood. °· Fingerstick blood glucose monitoring. °· Laboratory results. °TREATMENT  °First, knowing the cause of the hyperglycemia is important before the hyperglycemia can be treated. Treatment may include, but is not be limited to: °· Education. °· Change or adjustment in medications. °· Change or adjustment in meal plan. °· Treatment for an illness, infection, etc. °· More frequent blood glucose monitoring. °· Change in exercise plan. °· Decreasing or stopping steroids. °· Lifestyle changes. °HOME CARE INSTRUCTIONS  °· Test your blood glucose as directed. °· Exercise regularly. Your caregiver will give you instructions about exercise. Pre-diabetes or diabetes which comes on with stress is helped by exercising. °· Eat wholesome,   balanced meals. Eat often and at regular, fixed times. Your caregiver or nutritionist will give you a meal plan to guide your sugar intake.  Being at an ideal weight is important. If needed, losing as little as 10 to 15 pounds may help improve blood  glucose levels. SEEK MEDICAL CARE IF:   You have questions about medicine, activity, or diet.  You continue to have symptoms (problems such as increased thirst, urination, or weight gain). SEEK IMMEDIATE MEDICAL CARE IF:   You are vomiting or have diarrhea.  Your breath smells fruity.  You are breathing faster or slower.  You are very sleepy or incoherent.  You have numbness, tingling, or pain in your feet or hands.  You have chest pain.  Your symptoms get worse even though you have been following your caregiver's orders.  If you have any other questions or concerns. Document Released: 12/05/2000 Document Revised: 09/03/2011 Document Reviewed: 10/08/2011 [facility] Patient Information 2015 [place], [place]. This information is not intended to replace advice given to you by your health care provider. Make sure you discuss any questions you have with your health care provider. You had a full evaluation and the cause of your vomiting is uknown.  You should follow-up with GI for upper endoscopy for further evaluation.  Nausea and Vomiting Nausea is a sick feeling that often comes before throwing up (vomiting). Vomiting is a reflex where stomach contents come out of your mouth. Vomiting can cause severe loss of body fluids (dehydration). Children and elderly adults can become dehydrated quickly, especially if they also have diarrhea. Nausea and vomiting are symptoms of a condition or disease. It is important to find the cause of your symptoms. CAUSES   Direct irritation of the stomach lining. This irritation can result from increased acid production (gastroesophageal reflux disease), infection, food poisoning, taking certain medicines (such as nonsteroidal anti-inflammatory drugs), alcohol use, or tobacco use.  Signals from the brain.These signals could be caused by a headache, heat exposure, an inner ear disturbance, increased pressure in the brain from injury, infection, a tumor, or a  concussion, pain, emotional stimulus, or metabolic problems.  An obstruction in the gastrointestinal tract (bowel obstruction).  Illnesses such as diabetes, hepatitis, gallbladder problems, appendicitis, kidney problems, cancer, sepsis, atypical symptoms of a heart attack, or eating disorders.  Medical treatments such as chemotherapy and radiation.  Receiving medicine that makes you sleep (general anesthetic) during surgery. DIAGNOSIS Your caregiver may ask for tests to be done if the problems do not improve after a few days. Tests may also be done if symptoms are severe or if the reason for the nausea and vomiting is not clear. Tests may include:  Urine tests.  Blood tests.  Stool tests.  Cultures (to look for evidence of infection).  X-rays or other imaging studies. Test results can help your caregiver make decisions about treatment or the need for additional tests. TREATMENT You need to stay well hydrated. Drink frequently but in small amounts.You may wish to drink water, sports drinks, clear broth, or eat frozen ice pops or gelatin dessert to help stay hydrated.When you eat, eating slowly may help prevent nausea.There are also some antinausea medicines that may help prevent nausea. HOME CARE INSTRUCTIONS   Take all medicine as directed by your caregiver.  If you do not have an appetite, do not force yourself to eat. However, you must continue to drink fluids.  If you have an appetite, eat a normal diet unless your caregiver tells you differently.  Eat a variety of complex carbohydrates (rice, wheat, potatoes, bread), lean meats, yogurt, fruits, and vegetables.  Avoid high-fat foods because they are more difficult to digest.  Drink enough water and fluids to keep your urine clear or pale yellow.  If you are dehydrated, ask your caregiver for specific rehydration instructions. Signs of dehydration may include:  Severe thirst.  Dry lips and mouth.  Dizziness.  Dark  urine.  Decreasing urine frequency and amount.  Confusion.  Rapid breathing or pulse. SEEK IMMEDIATE MEDICAL CARE IF:   You have blood or brown flecks (like coffee grounds) in your vomit.  You have black or bloody stools.  You have a severe headache or stiff neck.  You are confused.  You have severe abdominal pain.  You have chest pain or trouble breathing.  You do not urinate at least once every 8 hours.  You develop cold or clammy skin.  You continue to vomit for longer than 24 to 48 hours.  You have a fever. MAKE SURE YOU:   Understand these instructions.  Will watch your condition.  Will get help right away if you are not doing well or get worse. Document Released: 06/11/2005 Document Revised: 09/03/2011 Document Reviewed: 11/08/2010 [facility] Patient Information 2015 [place], [place]. This information is not intended to replace advice given to you by your health care provider. Make sure you discuss any questions you have with your health care provider.

## 2014-03-17 NOTE — ED Notes (Signed)
Pt states she is nausea but hasn't vomited since given 1st does of Phenergan,  Pt given 2nd dose of medication and diet sprite per Dr Dina Rich,  Pt is dry heaving,  NAD

## 2014-03-17 NOTE — ED Notes (Signed)
Pt told registration staff that she did not want to wait and left.

## 2014-03-17 NOTE — ED Notes (Addendum)
Pt. Is still aware that we need a urine specimen, but is unable to go at this time.

## 2014-03-17 NOTE — ED Provider Notes (Signed)
CSN: [id number]     Arrival date & time 03/17/14  1039 History   First MD Initiated Contact with Patient 03/17/14 1042     Chief Complaint  Patient presents with  . Abdominal Pain  . Nausea  . Emesis  . Blood Sugar Problem     (Consider location/radiation/quality/duration/timing/severity/associated sxs/prior Treatment) HPI Comments: Patient here complaining of worsening nausea and vomiting that she feels is from her gastroparesis. Was seen here last night for similar symptoms in had a abdominal CT which was negative. Patient was noted to be self inducing her emesis. She states that she has history of kidney stones but that was not present last night on her CAT scan. No fever or chills. No bilious component to her emesis. No diarrhea noted. Pain noted epigastric without radiation. Denies any urinary symptoms.  Patient is a 38 y.o. female presenting with abdominal pain and vomiting. The history is provided by the patient.  Abdominal Pain Associated symptoms: vomiting   Emesis Associated symptoms: abdominal pain     Past Medical History  Diagnosis Date  . Diabetes mellitus   . Headache(784.0)   . Seasonal allergies   . Abnormal Pap smear   . Gout   . Depression   . Anxiety   . UTI (urinary tract infection)   . Bipolar 1 disorder   . Kidney calculi   . Anemia    Past Surgical History  Procedure Laterality Date  . Cholecystectomy    . Esophagogastroduodenoscopy N/A 09/07/2012    Procedure: ESOPHAGOGASTRODUODENOSCOPY (EGD);  Surgeon: Juanita Craver, MD;  Location: [facility] ENDOSCOPY;  Service: Endoscopy;  Laterality: N/A;  . Cesarean section     Family History  Problem Relation Age of Onset  . Hypertension Mother   . Diabetes Mother   . Hypertension Maternal Aunt   . Diabetes Maternal Aunt   . Hypertension Maternal Uncle   . Diabetes Maternal Uncle   . Hypertension Maternal Grandmother    History  Substance Use Topics  . Smoking status: Current Every Day Smoker -- 0.50  packs/day    Types: Cigarettes  . Smokeless tobacco: Not on file  . Alcohol Use: No   OB History   Grav Para Term Preterm Abortions TAB SAB Ect Mult Living   8 8 7 1  0  0   7     Review of Systems  Gastrointestinal: Positive for vomiting and abdominal pain.  All other systems reviewed and are negative.     Allergies  Reglan; Reglan; and Zofran  Home Medications   Prior to Admission medications   Medication Sig Start Date End Date Taking? Authorizing Provider  alprazolam Duanne Moron) 2 MG tablet Take 2 mg by mouth 3 (three) times daily as needed for sleep or anxiety.    Historical Provider, MD  amphetamine-dextroamphetamine (ADDERALL) 30 MG tablet Take 30 mg by mouth 2 (two) times daily. Take 30 mg by mouth 2 times daily.    Historical Provider, MD  insulin aspart (NOVOLOG) 100 UNIT/ML injection Inject 10-20 Units into the skin 3 (three) times daily before meals. Sliding scale 05/07/13   Jonetta Osgood, MD  Insulin Glargine (LANTUS SOLOSTAR) 100 UNIT/ML SOPN Inject 80 Units into the skin at bedtime. 05/07/13   Shanker Kristeen Mans, MD  oxyCODONE-acetaminophen (PERCOCET) 10-325 MG per tablet Take 1 tablet by mouth every 6 (six) hours as needed for pain. 03/12/13   Robbie Lis, MD  promethazine (PHENERGAN) 25 MG tablet Take 1 tablet (25 mg total) by mouth every  6 (six) hours as needed for nausea or vomiting. 03/17/14   Merryl Hacker, MD   BP 155/66  Pulse 79  Temp(Src) 99 F (37.2 C) (Oral)  Resp 18  SpO2 99% Physical Exam  Nursing note and vitals reviewed. Constitutional: She is oriented to person, place, and time. She appears well-developed and well-nourished.  Non-toxic appearance. No distress.  HENT:  Head: Normocephalic and atraumatic.  Eyes: Conjunctivae, EOM and lids are normal. Pupils are equal, round, and reactive to light.  Neck: Normal range of motion. Neck supple. No tracheal deviation present. No mass present.  Cardiovascular: Normal rate, regular rhythm and  normal heart sounds.  Exam reveals no gallop.   No murmur heard. Pulmonary/Chest: Effort normal and breath sounds normal. No stridor. No respiratory distress. She has no decreased breath sounds. She has no wheezes. She has no rhonchi. She has no rales.  Abdominal: Soft. Normal appearance and bowel sounds are normal. She exhibits no distension. There is no tenderness. There is no rebound and no CVA tenderness.  Musculoskeletal: Normal range of motion. She exhibits no edema and no tenderness.  Neurological: She is alert and oriented to person, place, and time. She has normal strength. No cranial nerve deficit or sensory deficit. GCS eye subscore is 4. GCS verbal subscore is 5. GCS motor subscore is 6.  Skin: Skin is warm and dry. No abrasion and no rash noted.  Psychiatric: She has a normal mood and affect. Her speech is normal and behavior is normal.    ED Course  Procedures (including critical care time) Labs Review Labs Reviewed  BASIC METABOLIC PANEL  URINALYSIS, ROUTINE W REFLEX MICROSCOPIC    Imaging Review Ct Abdomen Pelvis W Contrast  03/17/2014   CLINICAL DATA:  Left flank pain.  Emesis.  Hyperglycemia.  EXAM: CT ABDOMEN AND PELVIS WITH CONTRAST  TECHNIQUE: Multidetector CT imaging of the abdomen and pelvis was performed using the standard protocol following bolus administration of intravenous contrast.  CONTRAST:  187mL OMNIPAQUE IOHEXOL 300 MG/ML  SOLN  COMPARISON:  10/18/2013  FINDINGS: BODY WALL: Evidence of small papule along the left mons pubis. Lower abdominal wall scarring, likely correlating to history of cesarean section.  LOWER CHEST: Mild scarring along the lower right major fissure.  ABDOMEN/PELVIS:  Liver: No focal abnormality.  Biliary: Cholecystectomy. No evidence of biliary obstruction or stone.  Pancreas: Unremarkable.  Spleen: Unremarkable.  Adrenals: Unremarkable.  Kidneys and ureters: 9 mm stone in the interpolar right kidney. Punctate calculus in the lower pole right  kidney. No hydronephrosis. No evidence of renal infection.  Bladder: Unremarkable.  Reproductive: 2.7 cm sub serosal fibroid from the uterine fundus. Intramural right cornual fibroid, measuring nearly 3 cm.  Bowel: No obstruction. Normal appendix.  Retroperitoneum: No mass or adenopathy.  Peritoneum: No ascites or pneumoperitoneum.  Vascular: No acute abnormality.  OSSEOUS: No acute abnormalities.  IMPRESSION: 1. No acute intra-abdominal findings. 2. Right nephrolithiasis. 3. Fibroid uterus.   Electronically Signed   By: Jorje Guild M.D.   On: 03/17/2014 02:13     EKG Interpretation None      MDM   Final diagnoses:  None    Patient given IV fluids here as well as insulin. No evidence of ketosis here. She is not in DKA. No emesis observed here. She was treated with IV Ativan and Phenergan. I have offered her promethazine suppositories which she has deferred. She has been encouraged to followup with her Dr.    Leota Jacobsen, MD 03/17/14  1503 

## 2014-03-17 NOTE — ED Notes (Signed)
Per [facility]- Seen at [facility] 03/16/2014- for presenting complaint. Treated and released. Pt here for re evaluation. Pt reports not any better. DX with Kidney stone and hyperglycemia. Pt reports N/V and compliant with medications yet feels no relief. Pt ambulatory on scene. Pt alert and agitated with care. No meds given in route.

## 2014-03-17 NOTE — ED Notes (Signed)
Pt requesting [employer] before she leaves. Pt given Diet Coke d/t hx of diabetes and elevated blood sugar.

## 2014-03-17 NOTE — ED Notes (Signed)
Call patient name no answer

## 2014-03-17 NOTE — ED Notes (Signed)
Can only run IV at current rate of 125 ml/hour gauge #22 - unable to tolerate any faster rate.

## 2014-03-17 NOTE — ED Notes (Signed)
Call patient name no answer and went outside and called her name no answer

## 2014-03-17 NOTE — ED Notes (Signed)
Pt. Is unable to use the restroom at this time, but is aware that we need a urine specimen.  

## 2014-03-17 NOTE — Discharge Instructions (Signed)
Take your insulin as directed. Followup with your Dr. Hyperglycemia Hyperglycemia occurs when the glucose (sugar) in your blood is too high. Hyperglycemia can happen for many reasons, but it most often happens to people who do not know they have diabetes or are not managing their diabetes properly.  CAUSES  Whether you have diabetes or not, there are other causes of hyperglycemia. Hyperglycemia can occur when you have diabetes, but it can also occur in other situations that you might not be as aware of, such as: Diabetes  If you have diabetes and are having problems controlling your blood glucose, hyperglycemia could occur because of some of the following reasons:  Not following your meal plan.  Not taking your diabetes medications or not taking it properly.  Exercising less or doing less activity than you normally do.  Being sick. Pre-diabetes  This cannot be ignored. Before people develop Type 2 diabetes, they almost always have "pre-diabetes." This is when your blood glucose levels are higher than normal, but not yet high enough to be diagnosed as diabetes. Research has shown that some long-term damage to the body, especially the heart and circulatory system, may already be occurring during pre-diabetes. If you take action to manage your blood glucose when you have pre-diabetes, you may delay or prevent Type 2 diabetes from developing. Stress  If you have diabetes, you may be "diet" controlled or on oral medications or insulin to control your diabetes. However, you may find that your blood glucose is higher than usual in the hospital whether you have diabetes or not. This is often referred to as "stress hyperglycemia." Stress can elevate your blood glucose. This happens because of hormones put out by the body during times of stress. If stress has been the cause of your high blood glucose, it can be followed regularly by your caregiver. That way he/she can make sure your hyperglycemia does not  continue to get worse or progress to diabetes. Steroids  Steroids are medications that act on the infection fighting system (immune system) to block inflammation or infection. One side effect can be a rise in blood glucose. Most people can produce enough extra insulin to allow for this rise, but for those who cannot, steroids make blood glucose levels go even higher. It is not unusual for steroid treatments to "uncover" diabetes that is developing. It is not always possible to determine if the hyperglycemia will go away after the steroids are stopped. A special blood test called an A1c is sometimes done to determine if your blood glucose was elevated before the steroids were started. SYMPTOMS  Thirsty.  Frequent urination.  Dry mouth.  Blurred vision.  Tired or fatigue.  Weakness.  Sleepy.  Tingling in feet or leg. DIAGNOSIS  Diagnosis is made by monitoring blood glucose in one or all of the following ways:  A1c test. This is a chemical found in your blood.  Fingerstick blood glucose monitoring.  Laboratory results. TREATMENT  First, knowing the cause of the hyperglycemia is important before the hyperglycemia can be treated. Treatment may include, but is not be limited to:  Education.  Change or adjustment in medications.  Change or adjustment in meal plan.  Treatment for an illness, infection, etc.  More frequent blood glucose monitoring.  Change in exercise plan.  Decreasing or stopping steroids.  Lifestyle changes. HOME CARE INSTRUCTIONS   Test your blood glucose as directed.  Exercise regularly. Your caregiver will give you instructions about exercise. Pre-diabetes or diabetes which comes on  with stress is helped by exercising.  Eat wholesome, balanced meals. Eat often and at regular, fixed times. Your caregiver or nutritionist will give you a meal plan to guide your sugar intake.  Being at an ideal weight is important. If needed, losing as little as 10 to  15 pounds may help improve blood glucose levels. SEEK MEDICAL CARE IF:   You have questions about medicine, activity, or diet.  You continue to have symptoms (problems such as increased thirst, urination, or weight gain). SEEK IMMEDIATE MEDICAL CARE IF:   You are vomiting or have diarrhea.  Your breath smells fruity.  You are breathing faster or slower.  You are very sleepy or incoherent.  You have numbness, tingling, or pain in your feet or hands.  You have chest pain.  Your symptoms get worse even though you have been following your caregiver's orders.  If you have any other questions or concerns. Document Released: 12/05/2000 Document Revised: 09/03/2011 Document Reviewed: 10/08/2011 [facility] Patient Information 2015 [place], [place]. This information is not intended to replace advice given to you by your health care provider. Make sure you discuss any questions you have with your health care provider. Chronic Pain Chronic pain can be defined as pain that is off and on and lasts for 3-6 months or longer. Many things cause chronic pain, which can make it difficult to make a diagnosis. There are many treatment options available for chronic pain. However, finding a treatment that works well for you may require trying various approaches until the right one is found. Many people benefit from a combination of two or more types of treatment to control their pain. SYMPTOMS  Chronic pain can occur anywhere in the body and can range from mild to very severe. Some types of chronic pain include:  Headache.  Low back pain.  Cancer pain.  Arthritis pain.  Neurogenic pain. This is pain resulting from damage to nerves. People with chronic pain may also have other symptoms such as:  Depression.  Anger.  Insomnia.  Anxiety. DIAGNOSIS  Your health care provider will help diagnose your condition over time. In many cases, the initial focus will be on excluding possible conditions that  could be causing the pain. Depending on your symptoms, your health care provider may order tests to diagnose your condition. Some of these tests may include:   Blood tests.   CT scan.   MRI.   X-rays.   Ultrasounds.   Nerve conduction studies.  You may need to see a specialist.  TREATMENT  Finding treatment that works well may take time. You may be referred to a pain specialist. He or she may prescribe medicine or therapies, such as:   Mindful meditation or yoga.  Shots (injections) of numbing or pain-relieving medicines into the spine or area of pain.  Local electrical stimulation.  Acupuncture.   Massage therapy.   Aroma, color, light, or sound therapy.   Biofeedback.   Working with a physical therapist to keep from getting stiff.   Regular, gentle exercise.   Cognitive or behavioral therapy.   Group support.  Sometimes, surgery may be recommended.  HOME CARE INSTRUCTIONS   Take all medicines as directed by your health care provider.   Lessen stress in your life by relaxing and doing things such as listening to calming music.   Exercise or be active as directed by your health care provider.   Eat a healthy diet and include things such as vegetables, fruits, fish, and lean  meats in your diet.   Keep all follow-up appointments with your health care provider.   Attend a support group with others suffering from chronic pain. SEEK MEDICAL CARE IF:   Your pain gets worse.   You develop a new pain that was not there before.   You cannot tolerate medicines given to you by your health care provider.   You have new symptoms since your last visit with your health care provider.  SEEK IMMEDIATE MEDICAL CARE IF:   You feel weak.   You have decreased sensation or numbness.   You lose control of bowel or bladder function.   Your pain suddenly gets much worse.   You develop shaking.  You develop chills.  You develop  confusion.  You develop chest pain.  You develop shortness of breath.  MAKE SURE YOU:  Understand these instructions.  Will watch your condition.  Will get help right away if you are not doing well or get worse. Document Released: 03/03/2002 Document Revised: 02/11/2013 Document Reviewed: 12/05/2012 [facility] Patient Information 2015 [place], [place]. This information is not intended to replace advice given to you by your health care provider. Make sure you discuss any questions you have with your health care provider.

## 2014-03-18 ENCOUNTER — Ambulatory Visit: Payer: Self-pay | Admitting: Cardiovascular Disease

## 2014-03-19 ENCOUNTER — Encounter (HOSPITAL_COMMUNITY): Payer: Self-pay | Admitting: Emergency Medicine

## 2014-03-19 ENCOUNTER — Emergency Department (HOSPITAL_COMMUNITY)
Admission: EM | Admit: 2014-03-19 | Discharge: 2014-03-19 | Disposition: A | Discharge status: Home / Self Care | Payer: Medicare Other | Source: Home / Self Care | Attending: Emergency Medicine | Admitting: Emergency Medicine

## 2014-03-19 ENCOUNTER — Emergency Department (HOSPITAL_COMMUNITY)
Admission: EM | Admit: 2014-03-19 | Discharge: 2014-03-19 | Disposition: A | Discharge status: Home / Self Care | Payer: Medicare Other | Attending: Emergency Medicine | Admitting: Emergency Medicine

## 2014-03-19 DIAGNOSIS — R42 Dizziness and giddiness: Secondary | ICD-10-CM | POA: Diagnosis not present

## 2014-03-19 DIAGNOSIS — F17200 Nicotine dependence, unspecified, uncomplicated: Secondary | ICD-10-CM | POA: Insufficient documentation

## 2014-03-19 DIAGNOSIS — R197 Diarrhea, unspecified: Secondary | ICD-10-CM | POA: Diagnosis not present

## 2014-03-19 DIAGNOSIS — E119 Type 2 diabetes mellitus without complications: Secondary | ICD-10-CM | POA: Insufficient documentation

## 2014-03-19 DIAGNOSIS — Z862 Personal history of diseases of the blood and blood-forming organs and certain disorders involving the immune mechanism: Secondary | ICD-10-CM

## 2014-03-19 DIAGNOSIS — R630 Anorexia: Secondary | ICD-10-CM | POA: Insufficient documentation

## 2014-03-19 DIAGNOSIS — R000 Tachycardia, unspecified: Secondary | ICD-10-CM | POA: Insufficient documentation

## 2014-03-19 DIAGNOSIS — Z794 Long term (current) use of insulin: Secondary | ICD-10-CM | POA: Diagnosis not present

## 2014-03-19 DIAGNOSIS — Z3202 Encounter for pregnancy test, result negative: Secondary | ICD-10-CM | POA: Insufficient documentation

## 2014-03-19 DIAGNOSIS — Z87440 Personal history of urinary (tract) infections: Secondary | ICD-10-CM | POA: Diagnosis not present

## 2014-03-19 DIAGNOSIS — R739 Hyperglycemia, unspecified: Secondary | ICD-10-CM

## 2014-03-19 DIAGNOSIS — Z9889 Other specified postprocedural states: Secondary | ICD-10-CM | POA: Insufficient documentation

## 2014-03-19 DIAGNOSIS — F411 Generalized anxiety disorder: Secondary | ICD-10-CM | POA: Insufficient documentation

## 2014-03-19 DIAGNOSIS — F3289 Other specified depressive episodes: Secondary | ICD-10-CM | POA: Insufficient documentation

## 2014-03-19 DIAGNOSIS — E876 Hypokalemia: Secondary | ICD-10-CM | POA: Diagnosis not present

## 2014-03-19 DIAGNOSIS — Z8614 Personal history of Methicillin resistant Staphylococcus aureus infection: Secondary | ICD-10-CM | POA: Insufficient documentation

## 2014-03-19 DIAGNOSIS — R109 Unspecified abdominal pain: Secondary | ICD-10-CM

## 2014-03-19 DIAGNOSIS — G8929 Other chronic pain: Secondary | ICD-10-CM | POA: Insufficient documentation

## 2014-03-19 DIAGNOSIS — F329 Major depressive disorder, single episode, unspecified: Secondary | ICD-10-CM

## 2014-03-19 DIAGNOSIS — R10A1 Flank pain, right side: Secondary | ICD-10-CM

## 2014-03-19 DIAGNOSIS — Z87442 Personal history of urinary calculi: Secondary | ICD-10-CM | POA: Insufficient documentation

## 2014-03-19 DIAGNOSIS — Z8709 Personal history of other diseases of the respiratory system: Secondary | ICD-10-CM | POA: Insufficient documentation

## 2014-03-19 DIAGNOSIS — Z9089 Acquired absence of other organs: Secondary | ICD-10-CM | POA: Diagnosis not present

## 2014-03-19 DIAGNOSIS — Z79899 Other long term (current) drug therapy: Secondary | ICD-10-CM | POA: Insufficient documentation

## 2014-03-19 DIAGNOSIS — R112 Nausea with vomiting, unspecified: Secondary | ICD-10-CM | POA: Diagnosis not present

## 2014-03-19 LAB — URINALYSIS, ROUTINE W REFLEX MICROSCOPIC
BILIRUBIN URINE: NEGATIVE
Glucose, UA: >1000 mg/dL — AB
HGB URINE DIPSTICK: NEGATIVE
Ketones, ur: NEGATIVE mg/dL
Leukocytes, UA: NEGATIVE
Nitrite: NEGATIVE
PH: 6.5 (ref 5.0–8.0)
Protein, ur: NEGATIVE mg/dL
SPECIFIC GRAVITY, URINE: 1.01 (ref 1.005–1.030)
Urobilinogen, UA: 1 mg/dL (ref 0.0–1.0)

## 2014-03-19 LAB — COMPREHENSIVE METABOLIC PANEL
ALT: 11 U/L (ref 0–35)
AST: 14 U/L (ref 0–37)
Albumin: 3.5 g/dL (ref 3.5–5.2)
Alkaline Phosphatase: 87 U/L (ref 39–117)
Anion gap: 14 (ref 5–15)
BUN: 13 mg/dL (ref 6–23)
CO2: 22 meq/L (ref 19–32)
Calcium: 8.7 mg/dL (ref 8.4–10.5)
Chloride: 98 mEq/L (ref 96–112)
Creatinine, Ser: 0.64 mg/dL (ref 0.50–1.10)
GFR calc non Af Amer: >90 mL/min (ref >90)
GLUCOSE: 447 mg/dL — AB (ref 70–99)
POTASSIUM: 3.2 meq/L — AB (ref 3.7–5.3)
SODIUM: 134 meq/L — AB (ref 137–147)
TOTAL PROTEIN: 7.5 g/dL (ref 6.0–8.3)
Total Bilirubin: 0.3 mg/dL (ref 0.3–1.2)

## 2014-03-19 LAB — CBC WITH DIFFERENTIAL/PLATELET
BASOS PCT: 1 % (ref 0–1)
Basophils Absolute: 0.1 10*3/uL (ref 0.0–0.1)
EOS ABS: 0.1 10*3/uL (ref 0.0–0.7)
Eosinophils Relative: 1 % (ref 0–5)
HCT: 32.3 % — ABNORMAL LOW (ref 36.0–46.0)
Hemoglobin: 10 g/dL — ABNORMAL LOW (ref 12.0–15.0)
LYMPHS ABS: 2.3 10*3/uL (ref 0.7–4.0)
Lymphocytes Relative: 24 % (ref 12–46)
MCH: 20.9 pg — AB (ref 26.0–34.0)
MCHC: 31 g/dL (ref 30.0–36.0)
MCV: 67.4 fL — AB (ref 78.0–100.0)
MONO ABS: 0.6 10*3/uL (ref 0.1–1.0)
Monocytes Relative: 6 % (ref 3–12)
NEUTROS ABS: 6.3 10*3/uL (ref 1.7–7.7)
NEUTROS PCT: 68 % (ref 43–77)
Platelets: 340 10*3/uL (ref 150–400)
RBC: 4.79 MIL/uL (ref 3.87–5.11)
RDW: 19 % — ABNORMAL HIGH (ref 11.5–15.5)
WBC: 9.4 10*3/uL (ref 4.0–10.5)

## 2014-03-19 LAB — URINE MICROSCOPIC-ADD ON

## 2014-03-19 LAB — LIPASE, BLOOD: LIPASE: 72 U/L — AB (ref 11–59)

## 2014-03-19 LAB — CBG MONITORING, ED
Glucose-Capillary: 378 mg/dL — ABNORMAL HIGH (ref 70–99)
Glucose-Capillary: 213 mg/dL — ABNORMAL HIGH (ref 70–99)

## 2014-03-19 LAB — PREGNANCY, URINE: Preg Test, Ur: NEGATIVE

## 2014-03-19 LAB — MAGNESIUM: MAGNESIUM: 1.7 mg/dL (ref 1.5–2.5)

## 2014-03-19 MED ORDER — INSULIN ASPART 100 UNIT/ML IV SOLN: 10.0000 [IU] | Freq: Once | INTRAVENOUS | Status: DC

## 2014-03-19 MED ORDER — KETOROLAC TROMETHAMINE 15 MG/ML IJ SOLN
15.0000 mg | Freq: Once | INTRAMUSCULAR | Status: DC
  Filled 2014-03-19 (×2): qty 1

## 2014-03-19 MED ORDER — POTASSIUM CHLORIDE 10 MEQ/100ML IV SOLN: 10.0000 meq | Freq: Once | INTRAVENOUS | Status: DC

## 2014-03-19 MED ORDER — POTASSIUM CHLORIDE CRYS ER 20 MEQ PO TBCR
40.0000 meq | EXTENDED_RELEASE_TABLET | Freq: Once | ORAL | Status: AC
  Administered 2014-03-19: 40 meq via ORAL
  Filled 2014-03-19: qty 2

## 2014-03-19 MED ORDER — SODIUM CHLORIDE 0.9 % IV SOLN: Freq: Once | INTRAVENOUS | Status: DC

## 2014-03-19 MED ORDER — PROMETHAZINE HCL 25 MG/ML IJ SOLN
25.0000 mg | Freq: Once | INTRAMUSCULAR | Status: AC
  Administered 2014-03-19: 25 mg via INTRAVENOUS
  Filled 2014-03-19: qty 1

## 2014-03-19 MED ORDER — PROMETHAZINE HCL 25 MG RE SUPP
25.0000 mg | RECTAL | Status: DC
  Filled 2014-03-19: qty 1

## 2014-03-19 MED ORDER — HYDROMORPHONE HCL 1 MG/ML IJ SOLN
1.0000 mg | Freq: Once | INTRAMUSCULAR | Status: AC
  Administered 2014-03-19: 1 mg via INTRAVENOUS
  Filled 2014-03-19: qty 1

## 2014-03-19 MED ORDER — SODIUM CHLORIDE 0.9 % IV BOLUS (SEPSIS)
1000.0000 mL | Freq: Once | INTRAVENOUS | Status: AC
  Administered 2014-03-19: 1000 mL via INTRAVENOUS

## 2014-03-19 MED ORDER — PROMETHAZINE HCL 25 MG PO TABS: 25.0000 mg | ORAL_TABLET | Freq: Four times a day (QID) | ORAL | Status: DC | PRN

## 2014-03-19 MED ORDER — INSULIN ASPART 100 UNIT/ML ~~LOC~~ SOLN
10.0000 [IU] | Freq: Once | SUBCUTANEOUS | Status: AC
  Administered 2014-03-19: 10 [IU] via INTRAVENOUS
  Filled 2014-03-19: qty 1

## 2014-03-19 MED ORDER — PROMETHAZINE HCL 25 MG RE SUPP: 25.0000 mg | Freq: Four times a day (QID) | RECTAL | Status: DC | PRN

## 2014-03-19 NOTE — Discharge Instructions (Signed)
Please call your doctor for a followup appointment within 24-48 hours. When you talk to your doctor please let them know that you were seen in the emergency department and have them acquire all of your records so that they can discuss the findings with you and formulate a treatment plan to fully care for your new and ongoing problems. ° ° °Emergency Department Resource Guide °1) Find a Doctor and Pay Out of Pocket °Although you won't have to find out who is covered by your insurance plan, it is a good idea to ask around and get recommendations. You will then need to call the office and see if the doctor you have chosen will accept you as a new patient and what types of options they offer for patients who are self-pay. Some doctors offer discounts or will set up payment plans for their patients who do not have insurance, but you will need to ask so you aren't surprised when you get to your appointment. ° °2) Contact Your Local Health Department °Not all health departments have doctors that can see patients for sick visits, but many do, so it is worth a call to see if yours does. If you don't know where your local health department is, you can check in your phone book. The CDC also has a tool to help you locate your state's health department, and many state websites also have listings of all of their local health departments. ° °3) Find a Walk-in Clinic °If your illness is not likely to be very severe or complicated, you may want to try a walk in clinic. These are popping up all over the country in pharmacies, drugstores, and shopping centers. They're usually staffed by nurse practitioners or physician assistants that have been trained to treat common illnesses and complaints. They're usually fairly quick and inexpensive. However, if you have serious medical issues or chronic medical problems, these are probably not your best option. ° °No Primary Care Doctor: °- Call [facility] at  832-8000 - they can help you  locate a primary care doctor that  accepts your insurance, provides certain services, etc. °- Physician Referral Service- 1-800-533-3463 ° °Chronic Pain Problems: °Organization         Address  Phone   Notes  °[facility]  (336) 297-2271 Patients need to be referred by their primary care doctor.  ° °Medication Assistance: °Organization         Address  Phone   Notes  °[place] [facility] [address] °[place], [place] [address] (336) 641-8030 --Must be a resident of [place] °-- Must have NO insurance coverage whatsoever (no Medicaid/ Medicare, etc.) °-- The pt. MUST have a primary care doctor that directs their care regularly and follows them in the community °  °[facility]  (866) 331-1348   °[employer]  (888) 892-1162   ° °Agencies that provide inexpensive medical care: °Organization         Address  Phone   Notes  °[facility]  (336) 832-8035   °[facility]    (336) 832-7272   °[facility] [address] °[place], [place] [address] (336) 832-4777   °[facility] [address], °[place] (336) 271-4999   °[facility]    (336) 373-0678   °[facility]    (336) 272-1050   °[facility] ° [address],  Phone:  (336)   832-4444, Fax:  (336) 832-4440 Hours of Operation:  9 am - 6 pm, M-F.  Also accepts Medicaid/Medicare and self-pay.  °[facility] ° [address], [place] Phone: (336) 832-3150, Fax: (336) [phone]. Hours of Operation:  8:30 am - 5:30 pm, M-F.  Also accepts Medicaid and self-pay.  °[facility] [place] [address], [place] Phone: (336) 878-6027   °[facility] [address], [place], [place] (336)723-1848, Ext. 123 Mondays & Thursdays: 7-9 AM.  First 15 patients are seen on a first come, first serve basis. °  ° °Medicaid-accepting [place]  Providers: ° °Organization         Address  Phone   Notes  °[facility] [address], [place] (336) 641-2100 Also accepts self-pay patients.  °[facility] [address], [place] ° (336) 856-9996   °[facility] [address], [place] (336) 288-8857   °[facility] [address] [place] [address], [place] (336) 299-7000   °Veita Bland [address], [place]  ° (336) 373-1557 Only accepts [place] Access Medicaid patients after they have their name applied to their card.  ° °Self-Pay (no insurance) in [place]: ° °Organization         Address  Phone   Notes  °Sickle Cell Patients, [facility] [address], [place] (336) 832-1970   °[facility] [address], [place] (336) 832-4400   °[facility] ° [address], [place] (336) 992-4800   °[facility]/Dr. Osei-Bonsu ° [address] [place] [address], [place] or [address], [place] (336) 841-8500 Phone number for both [place] and [place] locations is the same.  °[facility] [address], [place] (336) 299-0000   °[facility] [address] [place] [address], [place] or [address] (336) 852-7530 °(336) 878-2260   °[facility] [address], [place] (336) 350-1642, phone; (336) 294-5005, fax Sees patients 1st and 3rd Saturday of every month.  Must not qualify for public or private insurance (i.e. Medicaid, Medicare, [place] Health Choice, Veterans' Benefits) • Household income should be no more than 200% of the poverty level •The clinic cannot treat you if you are pregnant or think you are pregnant • Sexually transmitted diseases are not treated at the clinic.  ° ° °Dental Care: °Organization         Address  Phone  Notes  °[facility] [address], [place] (336) 641-6152 Accepts children up to age 21 who are enrolled in Medicaid or [place] Health Choice; pregnant women with a Medicaid card; and children who have applied for Medicaid or [place] Health Choice, but were declined, whose parents can pay a reduced fee at time of service.  °[facility] [place]  [address], [place] (336) 641-7733 Accepts children up to age 21 who are enrolled in Medicaid or  Health Choice; pregnant women with a Medicaid card; and children who have applied for Medicaid or  Health Choice, but were declined, whose parents can pay a reduced fee at time of service.  °[facility] ° [address], [place] (336) 641-4533 Patients are seen by appointment only. Walk-ins are   not accepted. [facility] will see patients 18 years of age and older. °Monday - Tuesday (8am-5pm) °Most Wednesdays (8:30-5pm) °$30 per visit, cash only  °[facility] ° [address], [place] (336) 641-4533 Patients are seen by appointment only. Walk-ins are not accepted. [facility] will see patients 18 years of age and older. °One Wednesday Evening (Monthly: Volunteer Based).  $30 per visit, cash only  °[facility]  (919) 537-3737 for adults; Children under age 4, call Graduate Pediatric Dentistry at ([phone]) [phone]. Children aged 4-14, please call ([phone]) [phone] to request a pediatric application. ° Dental services are provided in all areas of dental care including fillings, crowns and bridges, complete and partial dentures, implants, gum treatment, root canals, and extractions. Preventive care is also provided. Treatment is provided to both adults and children. °Patients are selected via a lottery and there is often a waiting list. °  °[facility] [address], °[place] ° (336) 763-8833 [email or web address] °  °[facility]  [address], [place], [place] (336)723-1848, Ext. 123 Second and Fourth Thursday of each month, opens at 6:30 AM; Clinic ends at 9 AM.  Patients are seen on a first-come first-served basis, and a limited number are seen during each clinic.  ° °[facility] ° [address], [place], [place] (336) 723-7904   Eligibility Requirements °You must have lived in [place], [place], or [place] counties for at least the last three months. °  You cannot be eligible for state or federal sponsored healthcare insurance, including Veterans Administration, Medicaid, or Medicare. °  You generally cannot be eligible for healthcare insurance through your employer.  °  How to apply: °Eligibility screenings are held every Tuesday and Wednesday afternoon from 1:00 pm until 4:00 pm. You do not need an appointment for the interview!  °[facility] [address], [place], [place] 336-631-2330   °[facility]  336-342-8273   °[facility]  336-703-3100   °[facility]  336-570-6415   ° °Behavioral Health Resources in the Community: °Intensive Outpatient Programs °Organization         Address  Phone  Notes  °[facility] [address], [place], [place] 336-878-6098   °[facility] [address], [place], [place] 336-832-9800   °[facility] [address], [place], [place] ° 336-882-2125   °[facility] [address],  °[place], [place] 1-800-853-5163 or 336-641-4981   °Substance Abuse Resources °Organization         Address  Phone  Notes  °[facility]  336-882-2125   °[facility]  336-784-9470   °[facility]  336-285-9073   °[facility]  336-845-3988   °Residential & Outpatient Substance Abuse Program  1-800-659-3381   °Psychological Services °Organization         Address  Phone  Notes  °[facility]  336- 832-9600     °[facility]  336- 378-7881   °[facility] [address], [place] 1-800-853-5163 or 336-641-4981   ° °Mobile Crisis Teams °Organization         Address  Phone  Notes  °[facility], Mobile Crisis Care Unit  1-877-626-1772   °Assertive °[facility] ° [address] [place], [place] 336-834-9664   °Sharon DeEsch [address] ° [place] 336-554-5454   ° °  Self-Help/Support Groups Organization         Address  Phone             Notes  [facility] [place] - variety of support groups  [place] Call for more information  Narcotics Anonymous (NA), [facility] [address], [employer] [place]  2 meetings at this location   Special educational needs teacher         Address  Phone  Notes  [facility] [place],    [place]  1-607-539-2173   [facility]  [address], [place] [address], [place], [place]   [place], [place] [phone] Admissions: 8am-3pm M-F  [facility] [place] [address]    [place], [place] [phone]   [facility] [address] [place], [place], [place]   [facility] [address]  [place], [place]   [facility] Intensive Outpatient [address], [place], [place]   [facility] ([place] [place]  [place], [place] 1-864-031-3877 or (607)417-3392   [facility] [address], [place], [place] Accepts Medicaid  [facility] [address],  [place] 1-(740) 015-9159 Substance Abuse/Addiction Treatment   [facility]         Address  Phone  Notes  [facility]  301 686 2936   Domenic Schwab, PhD [address] Arlis [place]a [place], [place]   510-642-0779 or [phone]    [facility] [place], [place] 617-347-5606   [facility] [address], [place], [place] 520-709-0134 Insurance/Medicaid/sponsorship through [facility] [address]                                    [place], [place] ([phone] [facility] [address] [place] [address], [place] [phone]    Dr. Adele Schilder  [phone]   [facility] Dept. 1) [address] [place] 2) [place] 3)  [address], [place] ([phone]) [phone]  [phone]   [place] 330-205-2259 or [phone] (After Hours)

## 2014-03-19 NOTE — ED Provider Notes (Signed)
CSN: [id number]     Arrival date & time 03/19/14  1235 History   First MD Initiated Contact with Patient 03/19/14 1330     Chief Complaint  Patient presents with  . Flank Pain     (Consider location/radiation/quality/duration/timing/severity/associated sxs/prior Treatment) HPI Comments: The patient is a 38 year old female with chronic pain including chronic flank pain abdominal pain and multiple episodes of nausea vomiting, , seen in the last several hours by myself and Dr. Reather Converse, had unremarkable workup, complete control of her symptoms and was discharged home.  She presents stating that she has ongoing abdominal pain and multiple episodes of vomiting at home. The patient has no other changes in her symptoms, she denies throwing up blood for me. There has been no blood in her stools.  Patient is a 38 y.o. female presenting with flank pain. The history is provided by the patient.  Flank Pain    Past Medical History  Diagnosis Date  . Diabetes mellitus   . Headache(784.0)   . Seasonal allergies   . Abnormal Pap smear   . Gout   . Depression   . Anxiety   . UTI (urinary tract infection)   . Bipolar 1 disorder   . Kidney calculi   . Anemia    Past Surgical History  Procedure Laterality Date  . Cholecystectomy    . Esophagogastroduodenoscopy N/A 09/07/2012    Procedure: ESOPHAGOGASTRODUODENOSCOPY (EGD);  Surgeon: Juanita Craver, MD;  Location: [facility] ENDOSCOPY;  Service: Endoscopy;  Laterality: N/A;  . Cesarean section     Family History  Problem Relation Age of Onset  . Hypertension Mother   . Diabetes Mother   . Hypertension Maternal Aunt   . Diabetes Maternal Aunt   . Hypertension Maternal Uncle   . Diabetes Maternal Uncle   . Hypertension Maternal Grandmother    History  Substance Use Topics  . Smoking status: Current Every Day Smoker -- 0.50 packs/day for 25 years    Types: Cigarettes  . Smokeless tobacco: Never Used  . Alcohol Use: No   OB History   Grav Para Term  Preterm Abortions TAB SAB Ect Mult Living   8 8 7 1  0  0   7     Review of Systems  Genitourinary: Positive for flank pain.  All other systems reviewed and are negative.     Allergies  Reglan; Ketorolac; Reglan; and Zofran  Home Medications   Prior to Admission medications   Medication Sig Start Date End Date Taking? Authorizing Provider  alprazolam Duanne Moron) 2 MG tablet Take 2 mg by mouth 3 (three) times daily as needed for sleep or anxiety.   Yes Historical Provider, MD  amphetamine-dextroamphetamine (ADDERALL) 30 MG tablet Take 30 mg by mouth 2 (two) times daily.    Yes Historical Provider, MD  insulin aspart (NOVOLOG) 100 UNIT/ML injection Inject 10-20 Units into the skin 3 (three) times daily before meals. Sliding scale 05/07/13  Yes Shanker Kristeen Mans, MD  Insulin Glargine (LANTUS SOLOSTAR) 100 UNIT/ML SOPN Inject 80 Units into the skin at bedtime. 05/07/13  Yes Shanker Kristeen Mans, MD  oxyCODONE-acetaminophen (PERCOCET) 10-325 MG per tablet Take 1 tablet by mouth every 6 (six) hours as needed for pain. 03/12/13  Yes Robbie Lis, MD  promethazine (PHENERGAN) 25 MG tablet Take 1 tablet (25 mg total) by mouth every 6 (six) hours as needed for nausea or vomiting. 03/19/14  Yes Johnna Acosta, MD   BP 190/86  Pulse 79  Temp(Src) 99.7  F (37.6 C) (Oral)  SpO2 100%  LMP 03/05/2014 Physical Exam  Nursing note and vitals reviewed. Constitutional: She appears well-developed and well-nourished.  HENT:  Head: Normocephalic and atraumatic.  Eyes: Conjunctivae are normal. Right eye exhibits no discharge. Left eye exhibits no discharge.  Pulmonary/Chest: Effort normal. No respiratory distress.  Abdominal: Soft. She exhibits no distension. There is tenderness ( mild diffuse tenderness, no guarding).  Neurological: She is alert. Coordination normal.  Skin: Skin is warm and dry. No rash noted. She is not diaphoretic. No erythema.  Psychiatric: She has a normal mood and affect.    ED  Course  Procedures (including critical care time) Labs Review Labs Reviewed - No data to display  Imaging Review No results found.    MDM   Final diagnoses:  None    The patient was offered rectal Phenergan, she refuses, states that she wants to go home instead. At this time the patient appears hemodynamically stable, she was medically cleared this morning on prior exam and workup.  The pt refused to stay for treatment and left without paperwork AMA    Johnna Acosta, MD 03/19/14 1900

## 2014-03-19 NOTE — Discharge Instructions (Signed)
If you were given medicines take as directed.  If you are on coumadin or contraceptives realize their levels and effectiveness is altered by many different medicines.  If you have any reaction (rash, tongues swelling, other) to the medicines stop taking and see a physician.   Please follow up as directed and return to the ER or see a physician for new or worsening symptoms.  Thank you. Filed Vitals:   03/19/14 0523 03/19/14 0530 03/19/14 0725  BP: 109/58  104/54  Pulse: 105  102  Temp: 98.3 F (36.8 C)    TempSrc: Oral    Resp:   16  Height: 5\' 6"  (1.676 m)    Weight:  180 lb (81.647 kg)   SpO2: 100%  100%

## 2014-03-19 NOTE — ED Notes (Signed)
Ambulated patient in hall. Patient able to walk but seemed unsteady on her feet.

## 2014-03-19 NOTE — ED Notes (Signed)
Pt very sleepy, had to wake up multiple times to attempt to take potassium tablets. Pt is attempting to talk but words are incomprehensible.

## 2014-03-19 NOTE — ED Notes (Signed)
CBG 213 

## 2014-03-19 NOTE — ED Notes (Signed)
Pt arrive POV from home with right flank pain. Just discharged this AM for same. Began throwing up blood at home and returned. Pt appears lethargic. VSS at present.

## 2014-03-19 NOTE — ED Notes (Signed)
While in with patient recording vitals, patients speech pattern seemed slurred to the point that it was very difficult to understand anything she was saying. However, when a Curator entered the room, I noticed the patients speech became much clearer and I was able to understand all that she was saying. The patients speech then reverted back to a slurred pattern as the Curator left the room.

## 2014-03-19 NOTE — ED Notes (Signed)
Dr. Reather Converse back at the bedside.

## 2014-03-19 NOTE — ED Notes (Signed)
Pt informed that discharge paperwork being prepared per her request; refused to wait for instructions, since "they were the same thing" she got earlier today; pt states pain 10/10. Pt drowsy with slightly slurred speech at discharge; left with daughter.

## 2014-03-19 NOTE — ED Notes (Signed)
Pt. C/o of right flank pain, nausea/vomiting, havent eaten in two days

## 2014-03-19 NOTE — ED Provider Notes (Signed)
CSN: [id number]     Arrival date & time 03/19/14  0510 History   First MD Initiated Contact with Patient 03/19/14 [phone]     Chief Complaint  Patient presents with  . Flank Pain     (Consider location/radiation/quality/duration/timing/severity/associated sxs/prior Treatment) HPI Comments: 38 year old female with high blood pressure, diabetes, pancreatitis, tobacco abuse, narcotic abuse, MRSA, C. difficile colitis, gastroparesis presents with right flank pain, nausea and vomiting for the past 2-3 days. Patient said similar in the past. Patient has not tolerated by mouth in over 2 days. No blood in the vomit or diarrhea. Patient C. difficile couple months prior. No current antibiotics. Patient has kidney stones in the right kidney however has never passed on that she knows of. No urinary symptoms or fevers.  Patient is a 38 y.o. female presenting with flank pain. The history is provided by the patient.  Flank Pain Associated symptoms include abdominal pain. Pertinent negatives include no chest pain, no headaches and no shortness of breath.    Past Medical History  Diagnosis Date  . Diabetes mellitus   . Headache(784.0)   . Seasonal allergies   . Abnormal Pap smear   . Gout   . Depression   . Anxiety   . UTI (urinary tract infection)   . Bipolar 1 disorder   . Kidney calculi   . Anemia    Past Surgical History  Procedure Laterality Date  . Cholecystectomy    . Esophagogastroduodenoscopy N/A 09/07/2012    Procedure: ESOPHAGOGASTRODUODENOSCOPY (EGD);  Surgeon: Juanita Craver, MD;  Location: [facility] ENDOSCOPY;  Service: Endoscopy;  Laterality: N/A;  . Cesarean section     Family History  Problem Relation Age of Onset  . Hypertension Mother   . Diabetes Mother   . Hypertension Maternal Aunt   . Diabetes Maternal Aunt   . Hypertension Maternal Uncle   . Diabetes Maternal Uncle   . Hypertension Maternal Grandmother    History  Substance Use Topics  . Smoking status: Current Every Day  Smoker -- 0.50 packs/day for 25 years    Types: Cigarettes  . Smokeless tobacco: Never Used  . Alcohol Use: No   OB History   Grav Para Term Preterm Abortions TAB SAB Ect Mult Living   8 8 7 1  0  0   7     Review of Systems  Constitutional: Positive for appetite change. Negative for fever and chills.  HENT: Negative for congestion.   Eyes: Negative for visual disturbance.  Respiratory: Negative for shortness of breath.   Cardiovascular: Negative for chest pain.  Gastrointestinal: Positive for nausea, vomiting, abdominal pain and diarrhea. Negative for blood in stool.  Genitourinary: Positive for flank pain. Negative for dysuria.  Musculoskeletal: Negative for back pain, neck pain and neck stiffness.  Skin: Negative for rash.  Neurological: Positive for light-headedness. Negative for headaches.      Allergies  Reglan; Reglan; and Zofran  Home Medications   Prior to Admission medications   Medication Sig Start Date End Date Taking? Authorizing Provider  alprazolam Duanne Moron) 2 MG tablet Take 2 mg by mouth 3 (three) times daily as needed for sleep or anxiety.    Historical Provider, MD  amphetamine-dextroamphetamine (ADDERALL) 30 MG tablet Take 30 mg by mouth 2 (two) times daily. Take 30 mg by mouth 2 times daily.    Historical Provider, MD  insulin aspart (NOVOLOG) 100 UNIT/ML injection Inject 10-20 Units into the skin 3 (three) times daily before meals. Sliding scale 05/07/13   Shanker  Kristeen Mans, MD  Insulin Glargine (LANTUS SOLOSTAR) 100 UNIT/ML SOPN Inject 80 Units into the skin at bedtime. 05/07/13   Shanker Kristeen Mans, MD  oxyCODONE-acetaminophen (PERCOCET) 10-325 MG per tablet Take 1 tablet by mouth every 6 (six) hours as needed for pain. 03/12/13   Robbie Lis, MD  promethazine (PHENERGAN) 25 MG tablet Take 1 tablet (25 mg total) by mouth every 6 (six) hours as needed for nausea or vomiting. 03/17/14   Merryl Hacker, MD   BP 109/58  Pulse 105  Temp(Src) 98.3 F (36.8 C)  (Oral)  Ht 5\' 6"  (1.676 m)  Wt 180 lb (81.647 kg)  BMI 29.07 kg/m2  SpO2 100%  LMP 03/05/2014 Physical Exam  Nursing note and vitals reviewed. Constitutional: She is oriented to person, place, and time. She appears well-developed and well-nourished.  HENT:  Head: Normocephalic and atraumatic.  Mild dry mucous membranes  Eyes: Conjunctivae are normal. Right eye exhibits no discharge. Left eye exhibits no discharge.  Neck: Normal range of motion. Neck supple. No tracheal deviation present.  Cardiovascular: Regular rhythm.  Tachycardia present.   Pulmonary/Chest: Effort normal and breath sounds normal.  Abdominal: Soft. She exhibits no distension. There is tenderness (mild right flank). There is no guarding.  Musculoskeletal: She exhibits no edema.  Neurological: She is alert and oriented to person, place, and time.  Skin: Skin is warm. No rash noted.  Psychiatric: She has a normal mood and affect.    ED Course  Procedures (including critical care time) Emergency Focused Ultrasound Exam Limited retroperitoneal ultrasound of kidneys  Performed and interpreted by Dr. Reather Converse Indication: flank pain Focused abdominal ultrasound with both kidneys imaged in transverse and longitudinal planes in real-time. Interpretation: No ydronephrosis visualized.   Images archived electronically  Labs Review Labs Reviewed  URINALYSIS, ROUTINE W REFLEX MICROSCOPIC - Abnormal; Notable for the following:    APPearance HAZY (*)    Glucose, UA >1000 (*)    All other components within normal limits  COMPREHENSIVE METABOLIC PANEL - Abnormal; Notable for the following:    Sodium 134 (*)    Potassium 3.2 (*)    Glucose, Bld 447 (*)    All other components within normal limits  CBC WITH DIFFERENTIAL - Abnormal; Notable for the following:    Hemoglobin 10.0 (*)    HCT 32.3 (*)    MCV 67.4 (*)    MCH 20.9 (*)    RDW 19.0 (*)    All other components within normal limits  LIPASE, BLOOD - Abnormal;  Notable for the following:    Lipase 72 (*)    All other components within normal limits  URINE MICROSCOPIC-ADD ON - Abnormal; Notable for the following:    Squamous Epithelial / LPF MANY (*)    Bacteria, UA FEW (*)    All other components within normal limits  CBG MONITORING, ED - Abnormal; Notable for the following:    Glucose-Capillary 378 (*)    All other components within normal limits  CBG MONITORING, ED - Abnormal; Notable for the following:    Glucose-Capillary 213 (*)    All other components within normal limits  PREGNANCY, URINE  MAGNESIUM    Imaging Review No results found. Ct Abdomen Pelvis W Contrast  03/17/2014   CLINICAL DATA:  Left flank pain.  Emesis.  Hyperglycemia.  EXAM: CT ABDOMEN AND PELVIS WITH CONTRAST  TECHNIQUE: Multidetector CT imaging of the abdomen and pelvis was performed using the standard protocol following bolus administration of intravenous  contrast.  CONTRAST:  171mL OMNIPAQUE IOHEXOL 300 MG/ML  SOLN  COMPARISON:  10/18/2013  FINDINGS: BODY WALL: Evidence of small papule along the left mons pubis. Lower abdominal wall scarring, likely correlating to history of cesarean section.  LOWER CHEST: Mild scarring along the lower right major fissure.  ABDOMEN/PELVIS:  Liver: No focal abnormality.  Biliary: Cholecystectomy. No evidence of biliary obstruction or stone.  Pancreas: Unremarkable.  Spleen: Unremarkable.  Adrenals: Unremarkable.  Kidneys and ureters: 9 mm stone in the interpolar right kidney. Punctate calculus in the lower pole right kidney. No hydronephrosis. No evidence of renal infection.  Bladder: Unremarkable.  Reproductive: 2.7 cm sub serosal fibroid from the uterine fundus. Intramural right cornual fibroid, measuring nearly 3 cm.  Bowel: No obstruction. Normal appendix.  Retroperitoneum: No mass or adenopathy.  Peritoneum: No ascites or pneumoperitoneum.  Vascular: No acute abnormality.  OSSEOUS: No acute abnormalities.  IMPRESSION: 1. No acute  intra-abdominal findings. 2. Right nephrolithiasis. 3. Fibroid uterus.   Electronically Signed   By: Jorje Guild M.D.   On: 03/17/2014 02:13     EKG Interpretation None      MDM   Final diagnoses:  Hypokalemia  Right flank pain  Hyperglycemia  Non-intractable vomiting with nausea, vomiting of unspecified type   Patient multiple medical problems and recurrent visits for abdominal pain and flank pain presents with right flank pain and persistent nausea and vomiting. Mild dehydration clinically and mild tachycardia. IV fluids, antiemetics and pain meds ordered. Urinalysis pending. Bedside ultrasound no significant hydronephrosis. Patient has had multiple CT scans the last 5 years with radiation risk and similar to previous I do not feel a CT scan is warranted. Lipase pending. Repeat glu pending.  Patient signed out to ED provider for final dispo.  Filed Vitals:   03/19/14 0530 03/19/14 0725 03/19/14 0837 03/19/14 0930  BP:  104/54 105/59 111/62  Pulse:  102    Temp:   99.2 F (37.3 C) 98.3 F (36.8 C)  TempSrc:   Oral Oral  Resp:  16 16 25   Height:      Weight: 180 lb (81.647 kg)     SpO2:  100% 100% 99%         Mariea Clonts, MD 03/21/14 [phone]

## 2014-03-19 NOTE — ED Provider Notes (Signed)
Care assumed at change of shift, patient has improved with pain and nausea medications, results of blood sugar improving after being dosed with medications, ambulatory with minimal difficulty, has a 39 year old son who is sober and waiting to drive her home. She requests more medication prior to discharge, she will be given a prescription for Phenergan, she is in agreement with this plan.    New Prescriptions   PROMETHAZINE (PHENERGAN) 25 MG TABLET    Take 1 tablet (25 mg total) by mouth every 6 (six) hours as needed for nausea or vomiting.      Johnna Acosta, MD 03/19/14 [phone]

## 2014-03-24 ENCOUNTER — Encounter (HOSPITAL_COMMUNITY): Payer: Self-pay | Admitting: Emergency Medicine

## 2014-03-30 ENCOUNTER — Encounter: Payer: Self-pay | Admitting: Cardiovascular Disease

## 2014-03-30 ENCOUNTER — Ambulatory Visit (INDEPENDENT_AMBULATORY_CARE_PROVIDER_SITE_OTHER): Payer: Medicare Other | Admitting: Cardiovascular Disease

## 2014-03-30 VITALS — BP 138/80 | HR 92 | Ht 66.0 in | Wt 201.0 lb

## 2014-03-30 DIAGNOSIS — S92919A Unspecified fracture of unspecified toe(s), initial encounter for closed fracture: Secondary | ICD-10-CM | POA: Insufficient documentation

## 2014-03-30 DIAGNOSIS — S92911A Unspecified fracture of right toe(s), initial encounter for closed fracture: Secondary | ICD-10-CM

## 2014-03-30 NOTE — Progress Notes (Signed)
03/30/2014 [name]   1976-01-24  [id number]  Primary Physician Pcp Not In System Primary Cardiologist: Melinda Harp MD [name]   HPI:  [name] is a 38 year old moderately overweight single African American female mother of 8 children referred by [name] for peripheral vascular evaluation because of purplish discolored slightly swollen right fourth toe. Her primary care physician is [name]. She has a history of diabetes, hypertension, and tobacco abuse. She apparently developed a discolored right fourth toe approximately 6 weeks ago and saw her podiatrist who referred her here for further evaluation. She denies claudication. She does smoke one pack per day. She's never had a heart attack or stroke. She denies chest pain or shortness of breath.   Current Outpatient Prescriptions  Medication Sig Dispense Refill  . alprazolam (XANAX) 2 MG tablet Take 2 mg by mouth 3 (three) times daily as needed for sleep or anxiety.      Marland Kitchen amphetamine-dextroamphetamine (ADDERALL) 30 MG tablet Take 30 mg by mouth 2 (two) times daily.       . insulin aspart (NOVOLOG) 100 UNIT/ML injection Inject 10-20 Units into the skin 3 (three) times daily before meals. Sliding scale  1 vial  2  . Insulin Glargine (LANTUS SOLOSTAR) 100 UNIT/ML SOPN Inject 80 Units into the skin at bedtime.  5 pen  2  . oxyCODONE-acetaminophen (PERCOCET) 10-325 MG per tablet Take 1 tablet by mouth every 6 (six) hours as needed for pain.  120 tablet  0  . promethazine (PHENERGAN) 25 MG suppository Place 1 suppository (25 mg total) rectally every 6 (six) hours as needed for nausea or vomiting.  12 suppository  1  . promethazine (PHENERGAN) 25 MG tablet Take 1 tablet (25 mg total) by mouth every 6 (six) hours as needed for nausea or vomiting.  12 tablet  0  . promethazine (PHENERGAN) 25 MG tablet Take 1 tablet (25 mg total) by mouth every 6 (six) hours as needed for nausea or vomiting.  12 tablet  0  .  [DISCONTINUED] insulin regular (NOVOLIN R,HUMULIN R) 100 units/mL injection Inject 70 Units into the skin 2 (two) times daily before a meal.       No current facility-administered medications for this visit.    Allergies  Allergen Reactions  . Reglan [Metoclopramide] Shortness Of Breath  . Ketorolac Hives  . Reglan [Metoclopramide] Hives  . Reglan [Metoclopramide]   . Zofran [Ondansetron Hcl] Hives and Nausea And Vomiting  . Zofran [Ondansetron Hcl]     History   Social History  . Marital Status: Single    Spouse Name: N/A    Number of Children: N/A  . Years of Education: N/A   Occupational History  . Not on file.   Social History Main Topics  . Smoking status: Current Every Day Smoker -- 0.50 packs/day for 25 years    Types: Cigarettes  . Smokeless tobacco: Never Used  . Alcohol Use: No  . Drug Use: No  . Sexual Activity: Yes    Birth Control/ Protection: None   Other Topics Concern  . Not on file   Social History Narrative   ** Merged History Encounter **       ** Merged History Encounter **         Review of Systems: General: negative for chills, fever, night sweats or weight changes.  Cardiovascular: negative for chest pain, dyspnea on exertion, edema, orthopnea, palpitations, paroxysmal nocturnal dyspnea or shortness of breath  Dermatological: negative for rash Respiratory: negative for cough or wheezing Urologic: negative for hematuria Abdominal: negative for nausea, vomiting, diarrhea, bright red blood per rectum, melena, or hematemesis Neurologic: negative for visual changes, syncope, or dizziness All other systems reviewed and are otherwise negative except as noted above.    Blood pressure 138/80, pulse 92, height 5\' 6"  (1.676 m), weight 201 lb (91.173 kg), last menstrual period 03/05/2014.  General appearance: alert and no distress Neck: no adenopathy, no carotid bruit, no JVD, supple, symmetrical, trachea midline and thyroid not enlarged,  symmetric, no tenderness/mass/nodules Lungs: clear to auscultation bilaterally Heart: regular rate and rhythm, S1, S2 normal, no murmur, click, rub or gallop Extremities: extremities normal, atraumatic, no cyanosis or edema and 2+ pedal pulses bilaterally, purplish discolored right fourth toe with mild edema.  EKG not performed today  ASSESSMENT AND PLAN:   Broken toe The patient was 3 by [name], podiatrist, for a purplish discolored slightly swollen right fourth toe. She is a diabetic. She has no history of peripheral arterial disease or diabetic peripheral neuropathy. She has 2+ pedal pulses on that side. I do not believe this is a vascular issue and no further workup is necessary.      [name] 03/30/2014 11:15 AM

## 2014-03-30 NOTE — Patient Instructions (Signed)
Follow up with Dr Berry as needed.  

## 2014-03-30 NOTE — Assessment & Plan Note (Signed)
The patient was 3 by Dr. Trellis Paganini, podiatrist, for a purplish discolored slightly swollen right fourth toe. She is a diabetic. She has no history of peripheral arterial disease or diabetic peripheral neuropathy. She has 2+ pedal pulses on that side. I do not believe this is a vascular issue and no further workup is necessary.

## 2014-03-31 ENCOUNTER — Ambulatory Visit: Payer: Self-pay | Admitting: Cardiovascular Disease

## 2014-04-26 ENCOUNTER — Encounter: Payer: Self-pay | Admitting: Cardiovascular Disease

## 2014-04-27 ENCOUNTER — Inpatient Hospital Stay (HOSPITAL_COMMUNITY)
Admission: EM | Admit: 2014-04-27 | Discharge: 2014-04-28 | DRG: 392 | Discharge status: Against Medical Advice | Payer: Medicare Other | Attending: Internal Medicine | Admitting: Internal Medicine

## 2014-04-27 ENCOUNTER — Encounter (HOSPITAL_COMMUNITY): Payer: Self-pay

## 2014-04-27 ENCOUNTER — Observation Stay (HOSPITAL_COMMUNITY): Payer: Medicare Other

## 2014-04-27 DIAGNOSIS — Z765 Malingerer [conscious simulation]: Secondary | ICD-10-CM

## 2014-04-27 DIAGNOSIS — F419 Anxiety disorder, unspecified: Secondary | ICD-10-CM | POA: Diagnosis present

## 2014-04-27 DIAGNOSIS — R112 Nausea with vomiting, unspecified: Secondary | ICD-10-CM | POA: Diagnosis present

## 2014-04-27 DIAGNOSIS — Z87442 Personal history of urinary calculi: Secondary | ICD-10-CM

## 2014-04-27 DIAGNOSIS — L03311 Cellulitis of abdominal wall: Secondary | ICD-10-CM | POA: Diagnosis present

## 2014-04-27 DIAGNOSIS — M109 Gout, unspecified: Secondary | ICD-10-CM | POA: Diagnosis present

## 2014-04-27 DIAGNOSIS — Z888 Allergy status to other drugs, medicaments and biological substances status: Secondary | ICD-10-CM

## 2014-04-27 DIAGNOSIS — K5909 Other constipation: Principal | ICD-10-CM | POA: Diagnosis present

## 2014-04-27 DIAGNOSIS — Z85810 Personal history of malignant neoplasm of tongue: Secondary | ICD-10-CM

## 2014-04-27 DIAGNOSIS — D571 Sickle-cell disease without crisis: Secondary | ICD-10-CM | POA: Diagnosis present

## 2014-04-27 DIAGNOSIS — Z9049 Acquired absence of other specified parts of digestive tract: Secondary | ICD-10-CM | POA: Diagnosis present

## 2014-04-27 DIAGNOSIS — M549 Dorsalgia, unspecified: Secondary | ICD-10-CM | POA: Diagnosis present

## 2014-04-27 DIAGNOSIS — F17210 Nicotine dependence, cigarettes, uncomplicated: Secondary | ICD-10-CM | POA: Diagnosis present

## 2014-04-27 DIAGNOSIS — R319 Hematuria, unspecified: Secondary | ICD-10-CM | POA: Diagnosis present

## 2014-04-27 DIAGNOSIS — G43A1 Cyclical vomiting, intractable: Secondary | ICD-10-CM

## 2014-04-27 DIAGNOSIS — Z794 Long term (current) use of insulin: Secondary | ICD-10-CM

## 2014-04-27 DIAGNOSIS — K3184 Gastroparesis: Secondary | ICD-10-CM

## 2014-04-27 DIAGNOSIS — Z7289 Other problems related to lifestyle: Secondary | ICD-10-CM

## 2014-04-27 DIAGNOSIS — G8929 Other chronic pain: Secondary | ICD-10-CM | POA: Diagnosis present

## 2014-04-27 DIAGNOSIS — T402X5A Adverse effect of other opioids, initial encounter: Secondary | ICD-10-CM | POA: Diagnosis present

## 2014-04-27 DIAGNOSIS — K5903 Drug induced constipation: Secondary | ICD-10-CM

## 2014-04-27 DIAGNOSIS — Z79899 Other long term (current) drug therapy: Secondary | ICD-10-CM

## 2014-04-27 DIAGNOSIS — Z8614 Personal history of Methicillin resistant Staphylococcus aureus infection: Secondary | ICD-10-CM

## 2014-04-27 DIAGNOSIS — F319 Bipolar disorder, unspecified: Secondary | ICD-10-CM | POA: Diagnosis present

## 2014-04-27 DIAGNOSIS — E1165 Type 2 diabetes mellitus with hyperglycemia: Secondary | ICD-10-CM | POA: Diagnosis present

## 2014-04-27 LAB — CBC WITH DIFFERENTIAL/PLATELET
BASOS ABS: 0 10*3/uL (ref 0.0–0.1)
Basophils Relative: 0 % (ref 0–1)
EOS PCT: 0 % (ref 0–5)
Eosinophils Absolute: 0 10*3/uL (ref 0.0–0.7)
HCT: 37 % (ref 36.0–46.0)
Hemoglobin: 11.7 g/dL — ABNORMAL LOW (ref 12.0–15.0)
Lymphocytes Relative: 10 % — ABNORMAL LOW (ref 12–46)
Lymphs Abs: 0.8 10*3/uL (ref 0.7–4.0)
MCH: 21.3 pg — AB (ref 26.0–34.0)
MCHC: 31.6 g/dL (ref 30.0–36.0)
MCV: 67.3 fL — ABNORMAL LOW (ref 78.0–100.0)
MONO ABS: 0.3 10*3/uL (ref 0.1–1.0)
Monocytes Relative: 4 % (ref 3–12)
NEUTROS PCT: 86 % — AB (ref 43–77)
Neutro Abs: 7.1 10*3/uL (ref 1.7–7.7)
PLATELETS: 266 10*3/uL (ref 150–400)
RBC: 5.5 MIL/uL — ABNORMAL HIGH (ref 3.87–5.11)
RDW: 17.9 % — ABNORMAL HIGH (ref 11.5–15.5)
WBC: 8.2 10*3/uL (ref 4.0–10.5)

## 2014-04-27 LAB — COMPREHENSIVE METABOLIC PANEL
ALBUMIN: 3.9 g/dL (ref 3.5–5.2)
ALT: 11 U/L (ref 0–35)
AST: 14 U/L (ref 0–37)
Alkaline Phosphatase: 98 U/L (ref 39–117)
Anion gap: 16 — ABNORMAL HIGH (ref 5–15)
BUN: 6 mg/dL (ref 6–23)
CALCIUM: 9.6 mg/dL (ref 8.4–10.5)
CO2: 25 mEq/L (ref 19–32)
CREATININE: 0.44 mg/dL — AB (ref 0.50–1.10)
Chloride: 98 mEq/L (ref 96–112)
GFR calc Af Amer: >90 mL/min (ref >90)
GFR calc non Af Amer: >90 mL/min (ref >90)
Glucose, Bld: 362 mg/dL — ABNORMAL HIGH (ref 70–99)
Potassium: 3.8 mEq/L (ref 3.7–5.3)
Sodium: 139 mEq/L (ref 137–147)
Total Bilirubin: 0.5 mg/dL (ref 0.3–1.2)
Total Protein: 9 g/dL — ABNORMAL HIGH (ref 6.0–8.3)

## 2014-04-27 LAB — GLUCOSE, CAPILLARY
GLUCOSE-CAPILLARY: 329 mg/dL — AB (ref 70–99)
Glucose-Capillary: 352 mg/dL — ABNORMAL HIGH (ref 70–99)
Glucose-Capillary: 338 mg/dL — ABNORMAL HIGH (ref 70–99)
Glucose-Capillary: 234 mg/dL — ABNORMAL HIGH (ref 70–99)

## 2014-04-27 LAB — URINALYSIS, ROUTINE W REFLEX MICROSCOPIC
Bilirubin Urine: NEGATIVE
Glucose, UA: >1000 mg/dL — AB
Ketones, ur: 40 mg/dL — AB
LEUKOCYTES UA: NEGATIVE
NITRITE: NEGATIVE
PROTEIN: 100 mg/dL — AB
SPECIFIC GRAVITY, URINE: 1.044 — AB (ref 1.005–1.030)
UROBILINOGEN UA: 0.2 mg/dL (ref 0.0–1.0)
pH: 6 (ref 5.0–8.0)

## 2014-04-27 LAB — URINE MICROSCOPIC-ADD ON

## 2014-04-27 LAB — LIPASE, BLOOD: Lipase: 37 U/L (ref 11–59)

## 2014-04-27 LAB — I-STAT TROPONIN, ED: Troponin i, poc: 0 ng/mL (ref 0.00–0.08)

## 2014-04-27 MED ORDER — PROMETHAZINE HCL 25 MG/ML IJ SOLN
25.0000 mg | Freq: Once | INTRAMUSCULAR | Status: AC
  Administered 2014-04-27: 25 mg via INTRAVENOUS
  Filled 2014-04-27: qty 1

## 2014-04-27 MED ORDER — SODIUM CHLORIDE 0.9 % IV SOLN
INTRAVENOUS | Status: DC
  Administered 2014-04-27: 11:00:00 via INTRAVENOUS

## 2014-04-27 MED ORDER — NALOXONE HCL 0.4 MG/ML IJ SOLN
0.4000 mg | INTRAMUSCULAR | Status: DC | PRN
  Administered 2014-04-27: 0.4 mg via INTRAVENOUS
  Filled 2014-04-27: qty 1

## 2014-04-27 MED ORDER — MORPHINE SULFATE (CONCENTRATE) 10 MG/0.5ML PO SOLN
5.0000 mg | ORAL | Status: DC | PRN
  Administered 2014-04-27 – 2014-04-28 (×5): 5 mg via SUBLINGUAL
  Filled 2014-04-27 (×4): qty 0.5
  Filled 2014-04-27: qty 5
  Filled 2014-04-27 (×2): qty 0.5

## 2014-04-27 MED ORDER — MORPHINE SULFATE 10 MG/5ML PO SOLN: 5.0000 mg | ORAL | Status: DC | PRN

## 2014-04-27 MED ORDER — INSULIN GLARGINE 100 UNIT/ML ~~LOC~~ SOLN
40.0000 [IU] | Freq: Every day | SUBCUTANEOUS | Status: DC
  Administered 2014-04-27: 40 [IU] via SUBCUTANEOUS
  Filled 2014-04-27 (×2): qty 0.4

## 2014-04-27 MED ORDER — HEPARIN SODIUM (PORCINE) 5000 UNIT/ML IJ SOLN
5000.0000 [IU] | Freq: Three times a day (TID) | INTRAMUSCULAR | Status: DC
  Filled 2014-04-27 (×6): qty 1

## 2014-04-27 MED ORDER — CLINDAMYCIN PHOSPHATE 600 MG/50ML IV SOLN
600.0000 mg | Freq: Once | INTRAVENOUS | Status: AC
  Administered 2014-04-27: 600 mg via INTRAVENOUS
  Filled 2014-04-27: qty 50

## 2014-04-27 MED ORDER — ACETAMINOPHEN 325 MG PO TABS: 650.0000 mg | ORAL_TABLET | Freq: Four times a day (QID) | ORAL | Status: DC | PRN

## 2014-04-27 MED ORDER — FENTANYL CITRATE 0.05 MG/ML IJ SOLN
50.0000 ug | Freq: Once | INTRAMUSCULAR | Status: AC
Start: 2014-04-27 — End: 2014-04-27
  Administered 2014-04-27: 50 ug via INTRAVENOUS
  Filled 2014-04-27: qty 2

## 2014-04-27 MED ORDER — INSULIN ASPART 100 UNIT/ML ~~LOC~~ SOLN
0.0000 [IU] | SUBCUTANEOUS | Status: DC
  Administered 2014-04-27: 11 [IU] via SUBCUTANEOUS
  Administered 2014-04-27: 15 [IU] via SUBCUTANEOUS
  Administered 2014-04-27: 11 [IU] via SUBCUTANEOUS
  Administered 2014-04-27: 5 [IU] via SUBCUTANEOUS
  Administered 2014-04-28: 8 [IU] via SUBCUTANEOUS

## 2014-04-27 MED ORDER — SODIUM CHLORIDE 0.9 % IV SOLN
Freq: Once | INTRAVENOUS | Status: AC
  Administered 2014-04-27: 05:00:00 via INTRAVENOUS

## 2014-04-27 MED ORDER — PEG 3350-KCL-NA BICARB-NACL 420 G PO SOLR
4000.0000 mL | Freq: Once | ORAL | Status: AC
  Administered 2014-04-27: 4000 mL via ORAL

## 2014-04-27 MED ORDER — ACETAMINOPHEN 650 MG RE SUPP: 650.0000 mg | Freq: Four times a day (QID) | RECTAL | Status: DC | PRN

## 2014-04-27 MED ORDER — HYDROMORPHONE HCL 1 MG/ML IJ SOLN
1.0000 mg | Freq: Once | INTRAMUSCULAR | Status: AC
  Administered 2014-04-27: 1 mg via INTRAVENOUS
  Filled 2014-04-27: qty 1

## 2014-04-27 MED ORDER — PROCHLORPERAZINE EDISYLATE 5 MG/ML IJ SOLN
10.0000 mg | Freq: Four times a day (QID) | INTRAMUSCULAR | Status: DC | PRN
  Administered 2014-04-27 – 2014-04-28 (×4): 10 mg via INTRAVENOUS
  Filled 2014-04-27 (×4): qty 2

## 2014-04-27 MED ORDER — LIDOCAINE 5 % EX OINT
TOPICAL_OINTMENT | Freq: Four times a day (QID) | CUTANEOUS | Status: DC | PRN
  Filled 2014-04-27: qty 35.44

## 2014-04-27 MED ORDER — LUBIPROSTONE 24 MCG PO CAPS
24.0000 ug | ORAL_CAPSULE | Freq: Two times a day (BID) | ORAL | Status: DC
  Administered 2014-04-27 – 2014-04-28 (×2): 24 ug via ORAL
  Filled 2014-04-27 (×5): qty 1

## 2014-04-27 NOTE — Progress Notes (Signed)
Pt threw up in basin smelt like fruit punch, pt had been drinking said she was thirsty.cup was on table and told her she wasn't suppose to drink anything

## 2014-04-27 NOTE — ED Notes (Signed)
Patient repeatedly pushing call bell to ask for pain medication Patient will then get out of bed, walk in ED hallways, following this nurse around asking for pain medication This nurse was in the process of discharging another ED patient when patient attempted to walk into room asking for pain medication This nurse informed patient that she needed to go back and stay in her room

## 2014-04-27 NOTE — Progress Notes (Addendum)
Pt requested pain medication. Upon entering the room pt was hard to arouse, once alert pt fell back to sleep without response to my requests to take medications.  Suspicion that pt may have taken narcotics from home/family members. Overheard family members discussing how to hide medications in the pts room.  Spoke with pt about taking medications from home and pt denied taking anything, informed her of the risks/consequences of taking home medications with hospital prescribed medications.  PRN pain medication held.  Pt remains lethargic. Will continue to monitor.

## 2014-04-27 NOTE — ED Provider Notes (Signed)
CSN: [id number]     Arrival date & time 04/27/14  0236 History   First MD Initiated Contact with Patient 04/27/14 0350     Chief Complaint  Patient presents with  . Emesis  . Diarrhea  . Nausea     (Consider location/radiation/quality/duration/timing/severity/associated sxs/prior Treatment) HPI Comments: Pt comes in with cc of abd pain, nausea, vomiting, and abscess. Hx of DM, gastroparesis. Pt has been having abd pain, diffuse x 2 days, with nausea and emesis x 14, bilious, non bloody. Pt has had 10+ loose BM as well, no bloody. Pt also reports that she is having lesions to her stomach and her buttock over the past few days, with some of them draining purulent material, only to reform. There is no fevers, chills. PT reports last pain med use was just few hours ago, but she is throwing all her meds up.  Patient is a 38 y.o. female presenting with vomiting and diarrhea. The history is provided by the patient.  Emesis Associated symptoms: abdominal pain and diarrhea   Associated symptoms: no headaches   Diarrhea Associated symptoms: abdominal pain and vomiting   Associated symptoms: no fever and no headaches     Past Medical History  Diagnosis Date  . Tongue cancer   . Diabetes mellitus without complication   . Sickle cell disease   . Diabetes mellitus   . Headache(784.0)   . Seasonal allergies   . Abnormal Pap smear   . Gout   . Depression   . Anxiety   . UTI (urinary tract infection)   . Bipolar 1 disorder   . Kidney calculi   . Anemia   . Purple toe syndrome     patient has a slightly swollen purplish discolored right fourth toe   Past Surgical History  Procedure Laterality Date  . Cholecystectomy    . Esophagogastroduodenoscopy N/A 09/07/2012    Procedure: ESOPHAGOGASTRODUODENOSCOPY (EGD);  Surgeon: Juanita Craver, MD;  Location: [facility] ENDOSCOPY;  Service: Endoscopy;  Laterality: N/A;  . Cesarean section     Family History  Problem Relation Age of Onset  . Hypertension  Mother   . Diabetes Mother   . Hypertension Maternal Aunt   . Diabetes Maternal Aunt   . Hypertension Maternal Uncle   . Diabetes Maternal Uncle   . Hypertension Maternal Grandmother    History  Substance Use Topics  . Smoking status: Current Every Day Smoker -- 0.50 packs/day for 25 years    Types: Cigarettes  . Smokeless tobacco: Never Used  . Alcohol Use: No   OB History    Gravida Para Term Preterm AB TAB SAB Ectopic Multiple Living   8 8 7 1  0  0   7     Review of Systems  Constitutional: Positive for activity change. Negative for fever.  Respiratory: Negative for shortness of breath.   Cardiovascular: Negative for chest pain.  Gastrointestinal: Positive for vomiting, abdominal pain and diarrhea. Negative for nausea.  Genitourinary: Negative for dysuria.  Musculoskeletal: Negative for neck pain.  Skin: Positive for rash.  Allergic/Immunologic: Negative for immunocompromised state.  Neurological: Negative for headaches.  All other systems reviewed and are negative.     Allergies  Reglan; Ketorolac; and Zofran  Home Medications   Prior to Admission medications   Medication Sig Start Date End Date Taking? Authorizing Provider  alprazolam Duanne Moron) 2 MG tablet Take 2 mg by mouth 3 (three) times daily as needed for sleep or anxiety.   Yes Historical Provider, MD  amphetamine-dextroamphetamine (ADDERALL) 30 MG tablet Take 30 mg by mouth 2 (two) times daily.    Yes Historical Provider, MD  insulin aspart (NOVOLOG) 100 UNIT/ML injection Inject 10-20 Units into the skin 3 (three) times daily before meals. Sliding scale 05/07/13  Yes Shanker Kristeen Mans, MD  Insulin Glargine (LANTUS SOLOSTAR) 100 UNIT/ML SOPN Inject 80 Units into the skin at bedtime. 05/07/13  Yes Shanker Kristeen Mans, MD  oxyCODONE-acetaminophen (PERCOCET) 10-325 MG per tablet Take 1 tablet by mouth every 6 (six) hours as needed for pain. 03/12/13  Yes Robbie Lis, MD  promethazine (PHENERGAN) 25 MG suppository  Place 1 suppository (25 mg total) rectally every 6 (six) hours as needed for nausea or vomiting. 03/19/14   Johnna Acosta, MD  promethazine (PHENERGAN) 25 MG tablet Take 1 tablet (25 mg total) by mouth every 6 (six) hours as needed for nausea or vomiting. 03/19/14   Johnna Acosta, MD  promethazine (PHENERGAN) 25 MG tablet Take 1 tablet (25 mg total) by mouth every 6 (six) hours as needed for nausea or vomiting. 03/19/14   Johnna Acosta, MD   BP 169/68 mmHg  Pulse 80  Temp(Src) 98.6 F (37 C) (Oral)  Resp 16  SpO2 100% Physical Exam  Constitutional: She is oriented to person, place, and time. She appears well-developed and well-nourished.  HENT:  Head: Normocephalic and atraumatic.  Eyes: EOM are normal. Pupils are equal, round, and reactive to light.  Neck: Neck supple.  Cardiovascular: Normal rate, regular rhythm and normal heart sounds.   No murmur heard. Pulmonary/Chest: Effort normal. No respiratory distress.  Abdominal: Soft. She exhibits no distension. There is tenderness. There is no rebound and no guarding.  LLQ and groin region has 2 distinct lesion with surrounding erythema. There is tenderness to palpation. No fluctuance, no crepitus.  Neurological: She is alert and oriented to person, place, and time.  Skin: Skin is warm and dry.  2-3 lesion to the L gluteal lesion, erythematous, raised, with tenderness to palpation ,no fluctuance, no drainage.  Nursing note and vitals reviewed.   ED Course  Procedures (including critical care time) Labs Review Labs Reviewed  CBC WITH DIFFERENTIAL - Abnormal; Notable for the following:    RBC 5.50 (*)    Hemoglobin 11.7 (*)    MCV 67.3 (*)    MCH 21.3 (*)    RDW 17.9 (*)    Neutrophils Relative % 86 (*)    Lymphocytes Relative 10 (*)    All other components within normal limits  COMPREHENSIVE METABOLIC PANEL - Abnormal; Notable for the following:    Glucose, Bld 362 (*)    Creatinine, Ser 0.44 (*)    Total Protein 9.0 (*)     Anion gap 16 (*)    All other components within normal limits  URINALYSIS, ROUTINE W REFLEX MICROSCOPIC - Abnormal; Notable for the following:    Specific Gravity, Urine 1.044 (*)    Glucose, UA >1000 (*)    Hgb urine dipstick MODERATE (*)    Ketones, ur 40 (*)    Protein, ur 100 (*)    All other components within normal limits  LIPASE, BLOOD  URINE MICROSCOPIC-ADD ON  I-STAT TROPOININ, ED    Imaging Review No results found.   EKG Interpretation None     6:59 AM PO challenge failed. Will admit. MDM   Final diagnoses:  Cellulitis, abdominal wall  Nausea and vomiting in adult  Gastroparesis    Pt comes in with cc  of abd pain, n/v/diarrhea. 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 She also has lesions in her abdomen x 2, and i on gluteal region. Reports that there has been purulent discharge. Pt has some cellulitis findings, but no abscess currently. Iv clinda given. Labs show ketones in the urine with mild hyperglycemia and mild gap. Iv hydration provided. PO challenge started, and pt continues to have bilious emesis. Gastroparesis vs. Opioid withdrawal is what we think as more likely. Given that she cant keep any meds down, and has infection, with MRSA hx, and iddm, will admit for optimization, as i fear she will go into DKA otherwise, or have worsening of her sx.      Varney Biles, MD 04/27/14 [phone]

## 2014-04-27 NOTE — ED Notes (Signed)
Patient resting in position of comfort with eyes closed RR WNL--even and unlabored with equal rise and fall of chest Patient in NAD Side rails up, call bell in reach  

## 2014-04-27 NOTE — ED Notes (Signed)
Patient now demanding that ED staff provide breakfast for her daughter Patient informed that we do not give out meals to family members Patient continues to ask for medication and continues to exhibit med seeking behavior ED charge aware Room assignment posted--will call report

## 2014-04-27 NOTE — ED Notes (Signed)
Patient transported to CXR Patient alert, but lethargic from previously given medications--see MAR

## 2014-04-27 NOTE — Progress Notes (Signed)
Clinical Social Work  CSW spoke with Mudlogger who reports when she was completing rounds that family voiced concern about patient abusing prescription pain medication. CSW reviewed chart and attempted to meet with patient at bedside. Patient sleeping and would not awaken for assessment at this time. CSW will continue to follow.  [place], [place] [phone]

## 2014-04-27 NOTE — ED Notes (Signed)
EMS reports patient called 911 with complaints of nausea/vomiting/diarrhea x 2 days.

## 2014-04-27 NOTE — Progress Notes (Signed)
Upon leadership rounding, patient asleep. Daughter, Kathlee Nations, approached me to state that she wanted to speak with the doctor. I asked if I could relay a message to the doctor for her. 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 She also stated that her mother takes several percocet per day and was here due to withdrawals. I text paged Dr. Wyline Copas to make him aware and he stated to continue monitoring patient.

## 2014-04-27 NOTE — ED Notes (Addendum)
Patient back from CXR Patient demanding additional pain and nausea medications Patient remains alert, but lethargic from previous administrations of medications--see [facility] Patient then got out of bed and walked halfway down the hallway to the bathroom, turned around and walked back to her room, stating that she needed a wheelchair Patient brought to bathroom in wheelchair While exiting bathroom, EDP informed patient that she needed to walk back to her room, since she did not need the wheelchair--patient has NO Neuro and/or musculoskeletal issues that require the use of a wheelchair Patient then asked this nurse, "Are you the nurse now?"  This nursing reminded patient again that I was assigned to her and that we had previously met Patient then informed this nurse that she wanted additional pain and nausea medication Patient then attempting to make herself vomit by coughing forcefully and sticking fingers down her throat Patient with hx of narcotic abuse EDP aware of ongoing issues with patient Dr. Earlie Counts to see patient

## 2014-04-27 NOTE — ED Notes (Signed)
Bed: KM63 Expected date:  Expected time:  Means of arrival:  Comments: EMS 38yo F N/V/D

## 2014-04-27 NOTE — H&P (Signed)
[facility] History and Physical  ROSELL KHOURI SFK:[id number] DOB: May 03, 1976 DOA: 04/27/2014  Referring physician: Emergency department PCP: Pcp Not In System  Specialists:   Chief Complaint: Intractable N/V  HPI: [name] is a 38 y.o. female  With a hx of substance abuse, drug seeking behavior, DM who presents with intractable n/v. Glucose noted to be in the 300's. No elevated GAP. Pt complained of continued nausea, was given IV phenergan. Also complained of pain in nonspecific parts of body - initially claimed she had kidney stones, but when confronted with abd xray results of no stones seen, pt says "oh, ok." Pt also noted and documented to be sticking her finger down her throat to induce vomiting. Hospitalist was consulted. Of note, abd xray with findings c/w increased stool burdon  Review of Systems:  Per above, the remainder of the 10pt ros reviewed and are neg  Past Medical History  Diagnosis Date  . Tongue cancer   . Diabetes mellitus without complication   . Sickle cell disease   . Diabetes mellitus   . Headache(784.0)   . Seasonal allergies   . Abnormal Pap smear   . Gout   . Depression   . Anxiety   . UTI (urinary tract infection)   . Bipolar 1 disorder   . Kidney calculi   . Anemia   . Purple toe syndrome     patient has a slightly swollen purplish discolored right fourth toe   Past Surgical History  Procedure Laterality Date  . Cholecystectomy    . Esophagogastroduodenoscopy N/A 09/07/2012    Procedure: ESOPHAGOGASTRODUODENOSCOPY (EGD);  Surgeon: Juanita Craver, MD;  Location: [facility] ENDOSCOPY;  Service: Endoscopy;  Laterality: N/A;  . Cesarean section     Social History:  reports that she has been smoking Cigarettes.  She has a 12.5 pack-year smoking history. She has never used smokeless tobacco. She reports that she does not drink alcohol or use illicit drugs.  where does patient live--home, ALF, SNF? and with whom if at home?  Can patient  participate in ADLs?  Allergies  Allergen Reactions  . Reglan [Metoclopramide] Hives and Shortness Of Breath  . Ketorolac Hives  . Zofran [Ondansetron Hcl] Hives and Nausea And Vomiting    Family History  Problem Relation Age of Onset  . Hypertension Mother   . Diabetes Mother   . Hypertension Maternal Aunt   . Diabetes Maternal Aunt   . Hypertension Maternal Uncle   . Diabetes Maternal Uncle   . Hypertension Maternal Grandmother     (be sure to complete)  Prior to Admission medications   Medication Sig Start Date End Date Taking? Authorizing Provider  alprazolam Duanne Moron) 2 MG tablet Take 2 mg by mouth 3 (three) times daily as needed for sleep or anxiety.   Yes Historical Provider, MD  amphetamine-dextroamphetamine (ADDERALL) 30 MG tablet Take 30 mg by mouth 2 (two) times daily.    Yes Historical Provider, MD  insulin aspart (NOVOLOG) 100 UNIT/ML injection Inject 10-20 Units into the skin 3 (three) times daily before meals. Sliding scale 05/07/13  Yes Shanker Kristeen Mans, MD  Insulin Glargine (LANTUS SOLOSTAR) 100 UNIT/ML SOPN Inject 80 Units into the skin at bedtime. 05/07/13  Yes Shanker Kristeen Mans, MD  oxyCODONE-acetaminophen (PERCOCET) 10-325 MG per tablet Take 1 tablet by mouth every 6 (six) hours as needed for pain. 03/12/13  Yes Robbie Lis, MD  promethazine (PHENERGAN) 25 MG suppository Place 1 suppository (25 mg total) rectally every 6 (  six) hours as needed for nausea or vomiting. 03/19/14   Johnna Acosta, MD  promethazine (PHENERGAN) 25 MG tablet Take 1 tablet (25 mg total) by mouth every 6 (six) hours as needed for nausea or vomiting. 03/19/14   Johnna Acosta, MD  promethazine (PHENERGAN) 25 MG tablet Take 1 tablet (25 mg total) by mouth every 6 (six) hours as needed for nausea or vomiting. 03/19/14   Johnna Acosta, MD   Physical Exam: Filed Vitals:   04/27/14 5784 04/27/14 0242 04/27/14 0433 04/27/14 0522  BP:  156/58  169/68  Pulse:  71 78 80  Temp:  98.6 F (37 C)     TempSrc:  Oral    Resp:  16    SpO2: 98% 100% 100% 100%     General:  Awake, in nad  Eyes: PERRL B  ENT: membranes moist, dentition fair  Neck: trachea midline, neck supple,   Cardiovascular: regular, s1, s2  Respiratory: normal resp effort, no wheezing  Abdomen: soft,nondistended, decreased BS  Skin: normal skin turgor, no abnormal skin lesions seen  Musculoskeletal: perfused, no clubbing  Psychiatric: mood/affect normal//no auditory/visual hallucinations  Neurologic: cn2-12 grossly intact, strength/sesnation intact  Labs on Admission:  Basic Metabolic Panel:  Recent Labs Lab 04/27/14 0344  NA 139  K 3.8  CL 98  CO2 25  GLUCOSE 362*  BUN 6  CREATININE 0.44*  CALCIUM 9.6   Liver Function Tests:  Recent Labs Lab 04/27/14 0344  AST 14  ALT 11  ALKPHOS 98  BILITOT 0.5  PROT 9.0*  ALBUMIN 3.9    Recent Labs Lab 04/27/14 0344  LIPASE 37   No results for input(s): AMMONIA in the last 168 hours. CBC:  Recent Labs Lab 04/27/14 0344  WBC 8.2  NEUTROABS 7.1  HGB 11.7*  HCT 37.0  MCV 67.3*  PLT 266   Cardiac Enzymes: No results for input(s): CKTOTAL, CKMB, CKMBINDEX, TROPONINI in the last 168 hours.  BNP (last 3 results) No results for input(s): PROBNP in the last 8760 hours. CBG: No results for input(s): GLUCAP in the last 168 hours.  Radiological Exams on Admission: Dg Abd Acute W/chest  04/27/2014   CLINICAL DATA:  Nausea vomiting and diarrhea. Mid abdominal pain extending to the back.  EXAM: ACUTE ABDOMEN SERIES (ABDOMEN 2 VIEW & CHEST 1 VIEW)  COMPARISON:  None.  FINDINGS: The heart size is normal. Mild bibasilar airspace disease is worse on the scratch the minimal bibasilar airspace disease is evident. There is no edema or effusion to suggest failure.  Supine and upright views of the abdomen demonstrate a nonspecific bowel gas pattern. There is a relatively gasless abdomen. Gas and stool are present throughout the colon. Surgical clips  are present in the gallbladder fossa. No radiopaque stones are evident. The axial skeleton is within normal limits.  IMPRESSION: No acute abnormality of the chest or abdomen.   Electronically Signed   By: Lawrence Santiago M.D.   On: 04/27/2014 08:14    Assessment/Plan Active Problems:   Cellulitis, abdominal wall   Intractable nausea and vomiting  1. N/v 1. Pt observed to be inducing emesis by ED staff 2. Pt does have large stool burden per my read of abd film 3. Will cont IVF hydration, give amitiza, consider other cathartics 2. Chronic pain 1. Drug seeking behavior noted in the ED 2. Would be very hesitant to prescribe further narcotics in the patient unless there is an obvious need for aggressive analgesics 3. Constipation 1.  Amitizia 2. Consider other cathartics if needed 4. DM 1. Cont on 1/2 of lantus dose as pt is now NPO 2. Cont SSI coverage 5. DVT prophylaxis 1. Heparin subQ  Code Status: full Family Communication: Pt in room  Disposition Plan: Pending (indicate anticipated LOS)  Time spent: 52min  Severa Jeremiah, [place] [facility] Pager [phone]  If 7PM-7AM, please contact night-coverage [email or web address] Password [email or web address] 04/27/2014, 8:41 AM

## 2014-04-27 NOTE — ED Notes (Signed)
Patient voices concern about a few abscesses located on her left lower abdomen and left upper thigh.  After vomiting moderate amount of emesis, she fell asleep.

## 2014-04-27 NOTE — ED Notes (Signed)
Patient is a poor historian.  States her stomach has been hurting for "a day or two."  Immediately states, "You know I'm allergic to Zofran and Reglan, right?"  Unable to remember if her pain or N/V started first.

## 2014-04-27 NOTE — ED Notes (Signed)
Patient repeatedly removes her blood pressure cuff despite staff reminding her to keep it in place.  She is demanding additional pain medication at this time.  Dr. Kathrynn Humble made aware.

## 2014-04-28 DIAGNOSIS — T402X5A Adverse effect of other opioids, initial encounter: Secondary | ICD-10-CM | POA: Diagnosis present

## 2014-04-28 DIAGNOSIS — E1165 Type 2 diabetes mellitus with hyperglycemia: Secondary | ICD-10-CM | POA: Diagnosis present

## 2014-04-28 DIAGNOSIS — Z9049 Acquired absence of other specified parts of digestive tract: Secondary | ICD-10-CM | POA: Diagnosis present

## 2014-04-28 DIAGNOSIS — F319 Bipolar disorder, unspecified: Secondary | ICD-10-CM | POA: Diagnosis present

## 2014-04-28 DIAGNOSIS — K3184 Gastroparesis: Secondary | ICD-10-CM | POA: Diagnosis present

## 2014-04-28 DIAGNOSIS — M109 Gout, unspecified: Secondary | ICD-10-CM | POA: Diagnosis present

## 2014-04-28 DIAGNOSIS — F1910 Other psychoactive substance abuse, uncomplicated: Secondary | ICD-10-CM

## 2014-04-28 DIAGNOSIS — Z79899 Other long term (current) drug therapy: Secondary | ICD-10-CM | POA: Diagnosis not present

## 2014-04-28 DIAGNOSIS — Z85810 Personal history of malignant neoplasm of tongue: Secondary | ICD-10-CM | POA: Diagnosis not present

## 2014-04-28 DIAGNOSIS — Z8614 Personal history of Methicillin resistant Staphylococcus aureus infection: Secondary | ICD-10-CM | POA: Diagnosis not present

## 2014-04-28 DIAGNOSIS — F17210 Nicotine dependence, cigarettes, uncomplicated: Secondary | ICD-10-CM | POA: Diagnosis present

## 2014-04-28 DIAGNOSIS — R112 Nausea with vomiting, unspecified: Secondary | ICD-10-CM | POA: Insufficient documentation

## 2014-04-28 DIAGNOSIS — R319 Hematuria, unspecified: Secondary | ICD-10-CM | POA: Diagnosis present

## 2014-04-28 DIAGNOSIS — G8929 Other chronic pain: Secondary | ICD-10-CM | POA: Diagnosis present

## 2014-04-28 DIAGNOSIS — Z87442 Personal history of urinary calculi: Secondary | ICD-10-CM | POA: Diagnosis not present

## 2014-04-28 DIAGNOSIS — K5909 Other constipation: Secondary | ICD-10-CM | POA: Diagnosis present

## 2014-04-28 DIAGNOSIS — L03311 Cellulitis of abdominal wall: Secondary | ICD-10-CM | POA: Diagnosis present

## 2014-04-28 DIAGNOSIS — K5903 Drug induced constipation: Secondary | ICD-10-CM

## 2014-04-28 DIAGNOSIS — Z794 Long term (current) use of insulin: Secondary | ICD-10-CM | POA: Diagnosis not present

## 2014-04-28 DIAGNOSIS — D571 Sickle-cell disease without crisis: Secondary | ICD-10-CM | POA: Diagnosis present

## 2014-04-28 DIAGNOSIS — F419 Anxiety disorder, unspecified: Secondary | ICD-10-CM | POA: Diagnosis present

## 2014-04-28 DIAGNOSIS — Z7289 Other problems related to lifestyle: Secondary | ICD-10-CM | POA: Diagnosis not present

## 2014-04-28 DIAGNOSIS — Z888 Allergy status to other drugs, medicaments and biological substances status: Secondary | ICD-10-CM | POA: Diagnosis not present

## 2014-04-28 LAB — CBC
HEMATOCRIT: 35.8 % — AB (ref 36.0–46.0)
Hemoglobin: 11.4 g/dL — ABNORMAL LOW (ref 12.0–15.0)
MCH: 21.4 pg — ABNORMAL LOW (ref 26.0–34.0)
MCHC: 31.8 g/dL (ref 30.0–36.0)
MCV: 67.3 fL — ABNORMAL LOW (ref 78.0–100.0)
PLATELETS: 242 10*3/uL (ref 150–400)
RBC: 5.32 MIL/uL — ABNORMAL HIGH (ref 3.87–5.11)
RDW: 17.9 % — AB (ref 11.5–15.5)
WBC: 9.9 10*3/uL (ref 4.0–10.5)

## 2014-04-28 LAB — COMPREHENSIVE METABOLIC PANEL
ALBUMIN: 3.7 g/dL (ref 3.5–5.2)
ALK PHOS: 84 U/L (ref 39–117)
ALT: 7 U/L (ref 0–35)
AST: 14 U/L (ref 0–37)
Anion gap: 15 (ref 5–15)
BUN: 7 mg/dL (ref 6–23)
CHLORIDE: 101 meq/L (ref 96–112)
CO2: 23 meq/L (ref 19–32)
Calcium: 8.8 mg/dL (ref 8.4–10.5)
Creatinine, Ser: 0.42 mg/dL — ABNORMAL LOW (ref 0.50–1.10)
GFR calc Af Amer: >90 mL/min (ref >90)
Glucose, Bld: 298 mg/dL — ABNORMAL HIGH (ref 70–99)
POTASSIUM: 3.3 meq/L — AB (ref 3.7–5.3)
Sodium: 139 mEq/L (ref 137–147)
Total Bilirubin: 0.5 mg/dL (ref 0.3–1.2)
Total Protein: 8.3 g/dL (ref 6.0–8.3)

## 2014-04-28 LAB — GLUCOSE, CAPILLARY
Glucose-Capillary: 279 mg/dL — ABNORMAL HIGH (ref 70–99)
Glucose-Capillary: 250 mg/dL — ABNORMAL HIGH (ref 70–99)
Glucose-Capillary: 205 mg/dL — ABNORMAL HIGH (ref 70–99)

## 2014-04-28 MED ORDER — PROMETHAZINE HCL 25 MG RE SUPP: 25.0000 mg | Freq: Four times a day (QID) | RECTAL | Status: DC | PRN

## 2014-04-28 MED ORDER — INSULIN GLARGINE 100 UNIT/ML ~~LOC~~ SOLN
60.0000 [IU] | Freq: Every day | SUBCUTANEOUS | Status: DC
  Administered 2014-04-28: 60 [IU] via SUBCUTANEOUS
  Filled 2014-04-28: qty 0.6

## 2014-04-28 MED ORDER — TRAMADOL HCL 50 MG PO TABS: 100.0000 mg | ORAL_TABLET | Freq: Four times a day (QID) | ORAL | Status: DC | PRN

## 2014-04-28 MED ORDER — INSULIN ASPART 100 UNIT/ML ~~LOC~~ SOLN: 0.0000 [IU] | Freq: Every day | SUBCUTANEOUS | Status: DC

## 2014-04-28 MED ORDER — OXYCODONE-ACETAMINOPHEN 5-325 MG PO TABS
1.0000 | ORAL_TABLET | Freq: Four times a day (QID) | ORAL | Status: DC | PRN
  Administered 2014-04-28: 1 via ORAL
  Filled 2014-04-28: qty 1

## 2014-04-28 MED ORDER — POTASSIUM CHLORIDE IN NACL 40-0.9 MEQ/L-% IV SOLN
INTRAVENOUS | Status: DC
  Administered 2014-04-28: 75 mL/h via INTRAVENOUS
  Filled 2014-04-28: qty 1000

## 2014-04-28 MED ORDER — OXYCODONE-ACETAMINOPHEN 5-325 MG PO TABS: 2.0000 | ORAL_TABLET | Freq: Four times a day (QID) | ORAL | Status: DC | PRN

## 2014-04-28 MED ORDER — INSULIN ASPART 100 UNIT/ML ~~LOC~~ SOLN
0.0000 [IU] | Freq: Three times a day (TID) | SUBCUTANEOUS | Status: DC
  Administered 2014-04-28: 5 [IU] via SUBCUTANEOUS

## 2014-04-28 MED ORDER — BISACODYL 10 MG RE SUPP
10.0000 mg | Freq: Once | RECTAL | Status: AC
  Administered 2014-04-28: 10 mg via RECTAL
  Filled 2014-04-28: qty 1

## 2014-04-28 MED ORDER — INSULIN ASPART 100 UNIT/ML ~~LOC~~ SOLN
5.0000 [IU] | Freq: Three times a day (TID) | SUBCUTANEOUS | Status: DC
  Administered 2014-04-28: 5 [IU] via SUBCUTANEOUS

## 2014-04-28 MED ORDER — PROMETHAZINE HCL 25 MG PO TABS: 25.0000 mg | ORAL_TABLET | Freq: Four times a day (QID) | ORAL | Status: DC | PRN

## 2014-04-28 NOTE — Progress Notes (Signed)
[facility] PROGRESS NOTE  BRYNDLE CORREDOR HEN:[id number] DOB: 02-May-1976 DOA: 04/27/2014 PCP: Pcp Not In System  Brief Summary  With a hx of substance abuse, drug seeking behavior, DM who presented with intractable n/v. Glucose noted to be in the 300's. No elevated GAP. Pt complained of continued nausea, was given IV phenergan. Also complained of pain in nonspecific parts of body - initially claimed she had kidney stones, but when confronted with abd xray results of no stones seen, pt says "oh, ok." Pt also noted and documented to be sticking her finger down her throat to induce vomiting. Hospitalist was consulted. Of note, abd xray with findings c/w increased stool burden.    Assessment/Plan  Nausea and vomiting, may be secondary to constipation  -  X-ray of abdomen and pelvis unremarkable except for stool -  Liver function tests and lipase within normal limits -  Urinalysis with concentrated urine, elevated glucose, moderate hemoglobin with 21-50 WBC -  Continue antiemetics -  Advance diet -  Plan to discharge patient once she has a good BM and tolerating CLD  Constipation, still no BM -  S/p nulytely -  Continue amitiza -  Bisacodyl suppository  Chronic pain, family heard discussing where to hide narcotics in the patient's room and patient found nearly obtunded requiring narcan last night despite not receiving high dose narcotics from RN. -  Minimize narcotic pain medication:  D/c morphine -  Start ultram  DM with hyperglycemia -  Increase to lantus 60 units -  Add 5 units aspart with meals -  Continue mod dose SSI  Diet:  CLD Access:  PIV IVF:  yes Proph:  heparin  Code Status: full Family Communication: patient and two daughters Disposition Plan: likely home later today once having BMs and tolerating CLD   Consultants:  None  Procedures:  KUB  Antibiotics:  Clindamycin 11/3   HPI/Subjective:  Asking for IV pain medication, still having nausea and  vomiting.  Squirts of stool now   Objective: Filed Vitals:   04/27/14 1417 04/27/14 1821 04/27/14 2107 04/28/14 0608  BP: 159/72 185/81 168/64 180/81  Pulse: 85 91 81 77  Temp: 98 F (36.7 C)  98.4 F (36.9 C) 99.7 F (37.6 C)  TempSrc: Oral  Oral Oral  Resp: 20  18 16   SpO2: 98% 91% 99% 98%    Intake/Output Summary (Last 24 hours) at 04/28/14 0710 Last data filed at 04/27/14 1658  Gross per 24 hour  Intake      0 ml  Output      1 ml  Net     -1 ml   There were no vitals filed for this visit.  Exam:   General:  BF, No acute distress  HEENT:  NCAT, MMM  Cardiovascular:  RRR, nl S1, S2 no mrg, 2+ pulses, warm extremities  Respiratory:  CTAB, no increased WOB  Abdomen:   NABS, soft, NT/ND  MSK:   Normal tone and bulk, no LEE  Neuro:  Grossly intact  Data Reviewed: Basic Metabolic Panel:  Recent Labs Lab 04/27/14 0344 04/28/14 0428  NA 139 139  K 3.8 3.3*  CL 98 101  CO2 25 23  GLUCOSE 362* 298*  BUN 6 7  CREATININE 0.44* 0.42*  CALCIUM 9.6 8.8   Liver Function Tests:  Recent Labs Lab 04/27/14 0344 04/28/14 0428  AST 14 14  ALT 11 7  ALKPHOS 98 84  BILITOT 0.5 0.5  PROT 9.0* 8.3  ALBUMIN 3.9 3.7  Recent Labs Lab 04/27/14 0344  LIPASE 37   No results for input(s): AMMONIA in the last 168 hours. CBC:  Recent Labs Lab 04/27/14 0344 04/28/14 0428  WBC 8.2 9.9  NEUTROABS 7.1  --   HGB 11.7* 11.4*  HCT 37.0 35.8*  MCV 67.3* 67.3*  PLT 266 242   Cardiac Enzymes: No results for input(s): CKTOTAL, CKMB, CKMBINDEX, TROPONINI in the last 168 hours. BNP (last 3 results) No results for input(s): PROBNP in the last 8760 hours. CBG:  Recent Labs Lab 04/27/14 0959 04/27/14 1123 04/27/14 1653 04/27/14 1945  GLUCAP 352* 338* 329* 234*    No results found for this or any previous visit (from the past 240 hour(s)).   Studies: Dg Abd Acute W/chest  04/27/2014   CLINICAL DATA:  Nausea vomiting and diarrhea. Mid abdominal pain  extending to the back.  EXAM: ACUTE ABDOMEN SERIES (ABDOMEN 2 VIEW & CHEST 1 VIEW)  COMPARISON:  None.  FINDINGS: The heart size is normal. Mild bibasilar airspace disease is worse on the scratch the minimal bibasilar airspace disease is evident. There is no edema or effusion to suggest failure.  Supine and upright views of the abdomen demonstrate a nonspecific bowel gas pattern. There is a relatively gasless abdomen. Gas and stool are present throughout the colon. Surgical clips are present in the gallbladder fossa. No radiopaque stones are evident. The axial skeleton is within normal limits.  IMPRESSION: No acute abnormality of the chest or abdomen.   Electronically Signed   By: Lawrence Santiago M.D.   On: 04/27/2014 08:14    Scheduled Meds: . heparin  5,000 Units Subcutaneous 3 times per day  . insulin aspart  0-15 Units Subcutaneous 6 times per day  . insulin glargine  40 Units Subcutaneous Daily  . lubiprostone  24 mcg Oral BID WC   Continuous Infusions: . sodium chloride 125 mL/hr at 04/27/14 1128    Principal Problem:   Intractable nausea and vomiting Active Problems:   Backache   Nausea and vomiting   Cellulitis, abdominal wall   Drug-seeking behavior    Time spent: 30 min    Amisadai Woodford, [place] [facility] Pager [phone]. If 7PM-7AM, please contact night-coverage at [email or web address], password [facility] 04/28/2014, 7:10 AM  LOS: 1 day

## 2014-04-28 NOTE — Progress Notes (Signed)
Clinical Social Work  CSW attempted to meet with patient to complete psychosocial assessment. Patient sleeping when CSW arrived and would not wake up when CSW called her name or touched her hand. CSW will continue to follow and will complete full assessment at later time.  [place],  [phone]

## 2014-04-28 NOTE — Progress Notes (Signed)
This pt was supplying cigarettes to pt in room 1503, security in to intervene, packet of cigarette taken from pt. Pt. Threatening to leave AMA, MD notified. MD in to evaluate pt who is  insisting on leaving AMA. Pt signed leaving hospital against medical advice and left the unit.

## 2014-04-28 NOTE — Progress Notes (Signed)
Patient was able to tolerate drinking 4L of nulytely yesterday, but then today is stating [name] cannot drink anything due to nausea and vomiting.  [name] states [name] has vomited 4 times today, however, her vomit container was completely clean the first half of the day when [name] was given oral pain medication, [name] vomited once (actual vomiting not witnessed), and [name] vomited up her pill.  [name] previously was able to tolerate other pills.  Offered to try again with oral medication, but [name] repeatedly asked for IV pain medication.  Calling to the nursing station every few minutes with same request.   Declined her request at this time because her pain is chronic and [name] was recently able to tolerate copious oral fluids.  Now that her bowels are moving, I think [name] will be able to tolerate additional liquids by mouth without the need for IV hydration.  Would like to minimize her narcotics because of her recent narcotic bowel.  Her IV infiltrated and was removed.  Advised her to try to drink some fluids, but [name] refused and stated [name] wanted IV fluids and IV pain medication and IV phenergan.  Told her to try to push fluids this evening and we would offer oral and suppository nausea medication.  Her vital signs were stable, [name] clinically does not appear dehydrated.  If [name] develops signs of dehydration, will reassess need for IV.

## 2014-04-28 NOTE — Progress Notes (Signed)
UR completed 

## 2014-04-29 NOTE — Discharge Summary (Addendum)
Physician Discharge Summary  [name] YQI:[id number] DOB: 06/08/1976 DOA: 04/27/2014  PCP: Pcp Not In System  Admit date: 04/27/2014 Patient left AMA on  04/28/2014  Discharge Diagnoses:  Principal Problem:   Intractable nausea and vomiting Active Problems:   Backache   Nausea and vomiting   Cellulitis, abdominal wall   Drug-seeking behavior   Constipation due to opioid therapy   Nausea and vomiting in adult   Wt Readings from Last 3 Encounters:  03/30/14 91.173 kg (201 lb)  03/19/14 81.647 kg (180 lb)  05/07/13 91.173 kg (201 lb)    History of present illness:  [name] is a 38 y.o. female with a hx of substance abuse, drug seeking behavior, DM who presented with intractable n/v. Glucose noted to be in the 300's. No elevated GAP. Pt complained of continued nausea, was given IV phenergan. Also complained of pain in nonspecific parts of body - initially claimed she had kidney stones, but when confronted with abd xray results of no stones seen, pt says "oh, ok." Pt also noted and documented to be sticking her finger down her throat to induce vomiting. Hospitalist was consulted. Of note, abd xray with findings c/w increased stool burdon  Hospital Course:  Nausea and vomiting, may have been secondary to constipation  - X-ray of abdomen and pelvis unremarkable except for stool - Liver function tests and lipase within normal limits - Urinalysis with concentrated urine, elevated glucose, moderate hemoglobin with 21-50 WBC - Continued antiemetics - Advanced diet  Constipation, resolved after nulytely and amitiza per patient, although none of stools were observed by RN  Chronic pain, family heard discussing where to hide narcotics in the patient's room and patient found nearly obtunded requiring narcan previous night despite not receiving high dose narcotics from RN.  Multiple prescription narcotics and benzos that were not prescribed to the patient were found by security  when they searched her belongings.  D/c'd morphine and started ultram.  DM with hyperglycemia - Increased to lantus 60 units - Added 5 units aspart with meals - Continued mod dose SSI  Hematuria, may be suggestive of stone  -  Recommend f/u with urology at discharge  Consultants:  None  Procedures:  KUB  Antibiotics:  Clindamycin 11/3  Discharge Exam: Filed Vitals:   04/28/14 1405  BP: 159/80  Pulse: 112  Temp:   Resp: 18   Filed Vitals:   04/27/14 1821 04/27/14 2107 04/28/14 0608 04/28/14 1405  BP: 185/81 168/64 180/81 159/80  Pulse: 91 81 77 112  Temp:  98.4 F (36.9 C) 99.7 F (37.6 C)   TempSrc:  Oral Oral   Resp:  18 16 18   SpO2: 91% 99% 98% 97%     General: BF, No acute distress  HEENT: NCAT, MMM  Cardiovascular: RRR, nl S1, S2 no mrg, 2+ pulses, warm extremities  Respiratory: CTAB, no increased WOB  Abdomen: NABS, soft, NT/ND  MSK: Normal tone and bulk, no LEE  Neuro: Grossly intact  Discharge Instructions     Medication List    ASK your doctor about these medications        alprazolam 2 MG tablet  Commonly known as:  XANAX  Take 2 mg by mouth 3 (three) times daily as needed for sleep or anxiety.     amphetamine-dextroamphetamine 30 MG tablet  Commonly known as:  ADDERALL  Take 30 mg by mouth 2 (two) times daily.     insulin aspart 100 UNIT/ML injection  Commonly known  as:  novoLOG  Inject 10-20 Units into the skin 3 (three) times daily before meals. Sliding scale     Insulin Glargine 100 UNIT/ML Solostar Pen  Commonly known as:  LANTUS SOLOSTAR  Inject 80 Units into the skin at bedtime.     oxyCODONE-acetaminophen 10-325 MG per tablet  Commonly known as:  PERCOCET  Take 1 tablet by mouth every 6 (six) hours as needed for pain.     promethazine 25 MG tablet  Commonly known as:  PHENERGAN  Take 1 tablet (25 mg total) by mouth every 6 (six) hours as needed for nausea or vomiting.     promethazine 25 MG tablet   Commonly known as:  PHENERGAN  Take 1 tablet (25 mg total) by mouth every 6 (six) hours as needed for nausea or vomiting.     promethazine 25 MG suppository  Commonly known as:  PHENERGAN  Place 1 suppository (25 mg total) rectally every 6 (six) hours as needed for nausea or vomiting.          The results of significant diagnostics from this hospitalization (including imaging, microbiology, ancillary and laboratory) are listed below for reference.    Significant Diagnostic Studies: Dg Abd Acute W/chest  04/27/2014   CLINICAL DATA:  Nausea vomiting and diarrhea. Mid abdominal pain extending to the back.  EXAM: ACUTE ABDOMEN SERIES (ABDOMEN 2 VIEW & CHEST 1 VIEW)  COMPARISON:  None.  FINDINGS: The heart size is normal. Mild bibasilar airspace disease is worse on the scratch the minimal bibasilar airspace disease is evident. There is no edema or effusion to suggest failure.  Supine and upright views of the abdomen demonstrate a nonspecific bowel gas pattern. There is a relatively gasless abdomen. Gas and stool are present throughout the colon. Surgical clips are present in the gallbladder fossa. No radiopaque stones are evident. The axial skeleton is within normal limits.  IMPRESSION: No acute abnormality of the chest or abdomen.   Electronically Signed   By: Lawrence Santiago M.D.   On: 04/27/2014 08:14    Microbiology: No results found for this or any previous visit (from the past 240 hour(s)).   Labs: Basic Metabolic Panel:  Recent Labs Lab 04/27/14 0344 04/28/14 0428  NA 139 139  K 3.8 3.3*  CL 98 101  CO2 25 23  GLUCOSE 362* 298*  BUN 6 7  CREATININE 0.44* 0.42*  CALCIUM 9.6 8.8   Liver Function Tests:  Recent Labs Lab 04/27/14 0344 04/28/14 0428  AST 14 14  ALT 11 7  ALKPHOS 98 84  BILITOT 0.5 0.5  PROT 9.0* 8.3  ALBUMIN 3.9 3.7    Recent Labs Lab 04/27/14 0344  LIPASE 37   No results for input(s): AMMONIA in the last 168 hours. CBC:  Recent Labs Lab  04/27/14 0344 04/28/14 0428  WBC 8.2 9.9  NEUTROABS 7.1  --   HGB 11.7* 11.4*  HCT 37.0 35.8*  MCV 67.3* 67.3*  PLT 266 242   Cardiac Enzymes: No results for input(s): CKTOTAL, CKMB, CKMBINDEX, TROPONINI in the last 168 hours. BNP: BNP (last 3 results) No results for input(s): PROBNP in the last 8760 hours. CBG:  Recent Labs Lab 04/27/14 1653 04/27/14 1945 04/28/14 0736 04/28/14 1138 04/28/14 1650  GLUCAP 329* 234* 279* 250* 205*    Time coordinating discharge: 35 minutes  Signed:  Abir Eroh  [facility] 04/29/2014, 7:01 AM

## 2014-05-26 ENCOUNTER — Other Ambulatory Visit (HOSPITAL_COMMUNITY): Payer: Self-pay | Admitting: Podiatry

## 2014-05-26 DIAGNOSIS — M792 Neuralgia and neuritis, unspecified: Secondary | ICD-10-CM

## 2014-06-02 ENCOUNTER — Encounter (HOSPITAL_COMMUNITY): Payer: Medicare Other

## 2014-06-15 ENCOUNTER — Encounter (HOSPITAL_COMMUNITY): Payer: Self-pay | Admitting: Emergency Medicine

## 2014-06-15 ENCOUNTER — Emergency Department (HOSPITAL_COMMUNITY)
Admission: EM | Admit: 2014-06-15 | Discharge: 2014-06-15 | Discharge status: Absent without leave | Payer: Medicare Other

## 2014-06-15 ENCOUNTER — Encounter (HOSPITAL_COMMUNITY): Payer: Self-pay

## 2014-06-15 ENCOUNTER — Emergency Department (HOSPITAL_COMMUNITY)
Admission: EM | Admit: 2014-06-15 | Discharge: 2014-06-15 | Disposition: A | Discharge status: Home / Self Care | Payer: Medicare Other | Source: Home / Self Care | Attending: Emergency Medicine | Admitting: Emergency Medicine

## 2014-06-15 ENCOUNTER — Emergency Department (HOSPITAL_COMMUNITY)
Admission: EM | Admit: 2014-06-15 | Discharge: 2014-06-15 | Disposition: A | Discharge status: Home / Self Care | Payer: Medicare Other | Attending: Emergency Medicine | Admitting: Emergency Medicine

## 2014-06-15 DIAGNOSIS — R112 Nausea with vomiting, unspecified: Secondary | ICD-10-CM | POA: Insufficient documentation

## 2014-06-15 DIAGNOSIS — F419 Anxiety disorder, unspecified: Secondary | ICD-10-CM

## 2014-06-15 DIAGNOSIS — F329 Major depressive disorder, single episode, unspecified: Secondary | ICD-10-CM | POA: Insufficient documentation

## 2014-06-15 DIAGNOSIS — R6883 Chills (without fever): Secondary | ICD-10-CM | POA: Diagnosis not present

## 2014-06-15 DIAGNOSIS — Z8679 Personal history of other diseases of the circulatory system: Secondary | ICD-10-CM | POA: Insufficient documentation

## 2014-06-15 DIAGNOSIS — R1084 Generalized abdominal pain: Secondary | ICD-10-CM | POA: Insufficient documentation

## 2014-06-15 DIAGNOSIS — Z794 Long term (current) use of insulin: Secondary | ICD-10-CM

## 2014-06-15 DIAGNOSIS — Z720 Tobacco use: Secondary | ICD-10-CM | POA: Insufficient documentation

## 2014-06-15 DIAGNOSIS — Z9049 Acquired absence of other specified parts of digestive tract: Secondary | ICD-10-CM | POA: Diagnosis not present

## 2014-06-15 DIAGNOSIS — Z9889 Other specified postprocedural states: Secondary | ICD-10-CM | POA: Diagnosis not present

## 2014-06-15 DIAGNOSIS — F319 Bipolar disorder, unspecified: Secondary | ICD-10-CM | POA: Diagnosis not present

## 2014-06-15 DIAGNOSIS — Z87442 Personal history of urinary calculi: Secondary | ICD-10-CM

## 2014-06-15 DIAGNOSIS — E119 Type 2 diabetes mellitus without complications: Secondary | ICD-10-CM | POA: Insufficient documentation

## 2014-06-15 DIAGNOSIS — Z8739 Personal history of other diseases of the musculoskeletal system and connective tissue: Secondary | ICD-10-CM | POA: Insufficient documentation

## 2014-06-15 DIAGNOSIS — Z85810 Personal history of malignant neoplasm of tongue: Secondary | ICD-10-CM

## 2014-06-15 DIAGNOSIS — Z862 Personal history of diseases of the blood and blood-forming organs and certain disorders involving the immune mechanism: Secondary | ICD-10-CM

## 2014-06-15 DIAGNOSIS — Z87440 Personal history of urinary (tract) infections: Secondary | ICD-10-CM | POA: Insufficient documentation

## 2014-06-15 DIAGNOSIS — R1011 Right upper quadrant pain: Secondary | ICD-10-CM | POA: Diagnosis present

## 2014-06-15 LAB — COMPREHENSIVE METABOLIC PANEL
ALBUMIN: 4.4 g/dL (ref 3.5–5.2)
ALT: 13 U/L (ref 0–35)
ANION GAP: 12 (ref 5–15)
AST: 20 U/L (ref 0–37)
Alkaline Phosphatase: 91 U/L (ref 39–117)
BUN: 11 mg/dL (ref 6–23)
CO2: 23 mmol/L (ref 19–32)
CREATININE: 0.52 mg/dL (ref 0.50–1.10)
Calcium: 9.2 mg/dL (ref 8.4–10.5)
Chloride: 99 mEq/L (ref 96–112)
GFR calc Af Amer: >90 mL/min (ref >90)
GFR calc non Af Amer: >90 mL/min (ref >90)
Glucose, Bld: 267 mg/dL — ABNORMAL HIGH (ref 70–99)
Potassium: 4 mmol/L (ref 3.5–5.1)
Sodium: 134 mmol/L — ABNORMAL LOW (ref 135–145)
Total Bilirubin: 0.6 mg/dL (ref 0.3–1.2)
Total Protein: 8.7 g/dL — ABNORMAL HIGH (ref 6.0–8.3)

## 2014-06-15 LAB — URINALYSIS, ROUTINE W REFLEX MICROSCOPIC
Bilirubin Urine: NEGATIVE
Glucose, UA: >1000 mg/dL — AB
Ketones, ur: 40 mg/dL — AB
LEUKOCYTES UA: NEGATIVE
NITRITE: NEGATIVE
PH: 6.5 (ref 5.0–8.0)
Protein, ur: >300 mg/dL — AB
SPECIFIC GRAVITY, URINE: 1.035 — AB (ref 1.005–1.030)
UROBILINOGEN UA: 0.2 mg/dL (ref 0.0–1.0)

## 2014-06-15 LAB — URINE MICROSCOPIC-ADD ON

## 2014-06-15 LAB — CBC WITH DIFFERENTIAL/PLATELET
BASOS ABS: 0 10*3/uL (ref 0.0–0.1)
Basophils Relative: 0 % (ref 0–1)
EOS ABS: 0 10*3/uL (ref 0.0–0.7)
Eosinophils Relative: 0 % (ref 0–5)
HEMATOCRIT: 40.9 % (ref 36.0–46.0)
Hemoglobin: 13.3 g/dL (ref 12.0–15.0)
LYMPHS ABS: 0.8 10*3/uL (ref 0.7–4.0)
LYMPHS PCT: 6 % — AB (ref 12–46)
MCH: 22.1 pg — ABNORMAL LOW (ref 26.0–34.0)
MCHC: 32.5 g/dL (ref 30.0–36.0)
MCV: 67.9 fL — ABNORMAL LOW (ref 78.0–100.0)
Monocytes Absolute: 0.3 10*3/uL (ref 0.1–1.0)
Monocytes Relative: 2 % — ABNORMAL LOW (ref 3–12)
NEUTROS ABS: 11.5 10*3/uL — AB (ref 1.7–7.7)
Neutrophils Relative %: 92 % — ABNORMAL HIGH (ref 43–77)
PLATELETS: 266 10*3/uL (ref 150–400)
RBC: 6.02 MIL/uL — ABNORMAL HIGH (ref 3.87–5.11)
RDW: 19 % — ABNORMAL HIGH (ref 11.5–15.5)
WBC: 12.6 10*3/uL — ABNORMAL HIGH (ref 4.0–10.5)

## 2014-06-15 LAB — CBG MONITORING, ED: GLUCOSE-CAPILLARY: 273 mg/dL — AB (ref 70–99)

## 2014-06-15 LAB — LIPASE, BLOOD: Lipase: 20 U/L (ref 11–59)

## 2014-06-15 MED ORDER — DIPHENHYDRAMINE HCL 50 MG/ML IJ SOLN
50.0000 mg | Freq: Once | INTRAMUSCULAR | Status: AC
  Administered 2014-06-15: 50 mg via INTRAMUSCULAR
  Filled 2014-06-15: qty 1

## 2014-06-15 MED ORDER — PROMETHAZINE HCL 25 MG/ML IJ SOLN
25.0000 mg | INTRAMUSCULAR | Status: AC
  Administered 2014-06-15: 25 mg via INTRAVENOUS
  Filled 2014-06-15: qty 1

## 2014-06-15 MED ORDER — CLONIDINE HCL 0.2 MG PO TABS
0.2000 mg | ORAL_TABLET | Freq: Once | ORAL | Status: AC
  Administered 2014-06-15: 0.2 mg via ORAL
  Filled 2014-06-15: qty 1

## 2014-06-15 MED ORDER — SODIUM CHLORIDE 0.9 % IV SOLN
Freq: Once | INTRAVENOUS | Status: AC
  Administered 2014-06-15: 17:00:00 via INTRAVENOUS
  Filled 2014-06-15: qty 50

## 2014-06-15 MED ORDER — SODIUM CHLORIDE 0.9 % IV BOLUS (SEPSIS)
1000.0000 mL | Freq: Once | INTRAVENOUS | Status: AC
  Administered 2014-06-15: 1000 mL via INTRAVENOUS

## 2014-06-15 MED ORDER — DICYCLOMINE HCL 10 MG/ML IM SOLN
20.0000 mg | Freq: Once | INTRAMUSCULAR | Status: AC
  Administered 2014-06-15: 20 mg via INTRAMUSCULAR
  Filled 2014-06-15: qty 2

## 2014-06-15 MED ORDER — DICYCLOMINE HCL 20 MG PO TABS: 20.0000 mg | ORAL_TABLET | Freq: Two times a day (BID) | ORAL | Status: DC

## 2014-06-15 MED ORDER — CLONIDINE HCL 0.2 MG PO TABS: 0.2000 mg | ORAL_TABLET | Freq: Two times a day (BID) | ORAL | Status: DC

## 2014-06-15 MED ORDER — HALOPERIDOL LACTATE 5 MG/ML IJ SOLN
5.0000 mg | Freq: Once | INTRAMUSCULAR | Status: AC
  Administered 2014-06-15: 5 mg via INTRAMUSCULAR
  Filled 2014-06-15: qty 1

## 2014-06-15 MED ORDER — PROMETHAZINE HCL 25 MG PO TABS
25.0000 mg | ORAL_TABLET | Freq: Four times a day (QID) | ORAL | Status: DC | PRN
Start: 2014-06-15 — End: 2014-12-15

## 2014-06-15 NOTE — Discharge Instructions (Signed)
Please follow the directions provided.  Be sure to follow-up with your primary care provider to ensure you are getting better.  Use the dicyclomine as directed for pain and the phenergan for nausea.  Slowly re-introduce clear liquids.  Don't hesitate to return for new, worsening or concerning symptoms.     SEEK IMMEDIATE MEDICAL CARE IF:  Your pain does not go away within 2 hours.  You keep throwing up (vomiting).  Your pain is felt only in portions of the abdomen, such as the right side or the left lower portion of the abdomen.  You pass bloody or black tarry stools.    Emergency Department Resource Guide 1) Find a Doctor and Pay Out of Pocket Although you won't have to find out who is covered by your insurance plan, it is a good idea to ask around and get recommendations. You will then need to call the office and see if the doctor you have chosen will accept you as a new patient and what types of options they offer for patients who are self-pay. Some doctors offer discounts or will set up payment plans for their patients who do not have insurance, but you will need to ask so you aren't surprised when you get to your appointment.  2) Contact Your Local Health Department Not all health departments have doctors that can see patients for sick visits, but many do, so it is worth a call to see if yours does. If you don't know where your local health department is, you can check in your phone book. The CDC also has a tool to help you locate your state's health department, and many state websites also have listings of all of their local health departments.  3) Find a [facility] If your illness is not likely to be very severe or complicated, you may want to try a walk in clinic. These are popping up all over the country in pharmacies, drugstores, and shopping centers. They're usually staffed by nurse practitioners or physician assistants that have been trained to treat common illnesses and  complaints. They're usually fairly quick and inexpensive. However, if you have serious medical issues or chronic medical problems, these are probably not your best option.  No Primary Care Doctor: - Call Health Connect at  [phone] - they can help you locate a primary care doctor that  accepts your insurance, provides certain services, etc. - Physician Referral Service- [phone]  Chronic Pain Problems: Organization         Address     Phone             Notes  [facility]  [phone] Patients need to be referred by their primary care doctor.   Medication Assistance: Organization         Address     Phone             Notes  [facility] [address] [place], [place] [address] [phone] --Must be a resident of [facility] -- Must have NO insurance coverage whatsoever (no Medicaid/ Medicare, etc.) -- The pt. MUST have a primary care doctor that directs their care regularly and follows them in the community   [facility]  [phone]   [employer]  [phone]    Agencies that provide inexpensive medical care: Financial planner  Notes  [place]  [phone]   [facility]    [phone]   [facility] [place], [place] [address] [phone]   [place] [address], [place] [phone]   Planned Parenthood    [phone]   [facility]    [phone]   [place] and [place] [address], [place] Phone:  [phone], Fax:  [phone] Hours of Operation:  9 am - 6 pm, M-F.  Also accepts Medicaid/Medicare and self-pay.  [facility] for [place] [address], [address], [place] Phone: [phone], Fax: [phone]. Hours of Operation:  8:30 am - 5:30 pm, M-F.  Also accepts Medicaid and self-pay.   [facility] [address] [place] Phone: [phone]   [place], [place], [place] [phone], Ext. [phone] Mondays & Thursdays: 7-9 AM.  First 15 patients are seen on a first come, first serve basis.   [facility] [place] [address] [place] [address] [phone] Accepts Medicaid   [place] Providers:  Organization         Address     Phone             Notes  [facility] [address], [place] [phone] Also accepts self-pay patients.  [facility] [address], [place]  [phone]   [place], [address], [place] [phone]   [facility] [address], [place] [phone]   Lucianne Lei [address], [place]   [phone] Only accepts [place] Access [place] patients after they have their name applied to their card.   Self-Pay (no insurance) in [facility]:  Organization         Address     Phone             Notes  Sickle Cell Patients, [facility] [place] [phone]   [facility] [place] [phone]   [facility] [place]  [place], [place], [place] [phone]   [facility]/Dr. Osei-Bonsu  [address] [place] or [address], [place] [phone] Phone number for both [place] and [place] locations is the same.  Urgent Medical and [facility] [address] [place] [phone]   [facility] [address], [place] or [address] [phone]   [facility] [address] [place] [phone], phone; [phone], fax Sees patients 1st and 3rd Saturday of every month.  Must not  qualify for public or private insurance (i.e. Medicaid, Medicare, [place] Health Choice, Veterans' Benefits)  Household income should be no more than 200% of the poverty level The clinic cannot treat you if you are pregnant or think you are pregnant  Sexually transmitted diseases are not treated at the clinic.    Dental Care:  Organization         Address     Phone             Notes  [facility]  of [facility] [address] [place] [phone] Accepts children up to age 69 who are enrolled in [place] or [place]; pregnant women with a Medicaid card; and children who have applied for Medicaid or [place] Health Choice, but were declined, whose parents can pay a reduced fee at time of service.  [facility] Department of [facility]  [address], [place] [phone] Accepts children up to age 21 who are enrolled in [place] or [place]; pregnant women with a Medicaid card; and children who have applied for Medicaid or  Health Choice, but were declined, whose parents can pay a reduced fee at time of service.  [facility] Access PRO[facility]  [place] [phone] Patients are seen by appointment only. Walk-ins are not accepted. [place] will see patients 66 years of age and older. Monday - Tuesday (8am-5pm) Most Wednesdays (8:30-5pm) $30 per visit, cash only  [facility] Adult Dental Access PROGRAM  [address], [facility] [phone] Patients are seen by appointment only. Walk-ins are not accepted. [place] will see patients 57 years of age and older. One Wednesday Evening (Monthly: Volunteer Based).  $30 per visit, cash only  [place]  [phone] for adults; Children under age 71, call Graduate Pediatric Dentistry at [phone]. Children aged 39-14, please call [phone] to request a pediatric application.  Dental  services are provided in all areas of dental care including fillings, crowns and bridges, complete and partial dentures, implants, gum treatment, root canals, and extractions. Preventive care is also provided. Treatment is provided to both adults and children. Patients are selected via a lottery and there is often a waiting list.   [facility] [address] [place]  [phone] [email or web address]   [facility] [address] [place], [place] [phone], Ext. [phone] Second and Fourth Thursday of each month, opens at 6:30 AM; Clinic ends at 9 AM.  Patients are seen on a first-come first-served basis, and a limited number are seen during each clinic.   [facility]  [address] Hillard Danker [place], [place] [phone]   Eligibility Requirements You must have lived in [place], [place], or [place] counties for at least the last three months.   You cannot be eligible for state or federal sponsored [employer], including [employer], [place], or [employer].   You generally cannot be eligible for healthcare insurance through your employer.    How to apply: Eligibility screenings are held every Tuesday and Wednesday afternoon from 1:00 pm until 4:00 pm. You do not need an appointment for the interview!  [facility] [address] [place], [place]   [place]  [place] Department  [place]  [phone]    Behavioral Health Resources in the Community: Intensive Outpatient Programs Organization         Address     Phone             Notes  [place]. [address] [place], [place] [phone]   [facility] Outpatient [address], [place], [place]   ADS: Alcohol & Drug Svcs [address] [place], [place]   [place] [address],  [place], [place] or [phone]  Substance Abuse Resources Organization         Address     Phone             Notes  Alcohol and Drug Services  [place]  [phone]   [facility] [place]  [phone]   Chinita Pester  [phone]   Residential & Outpatient Substance Abuse Program  [phone]   Psychological Services Organization         Address     Phone             Notes  [facility]  [place]  [phone]   [place] [address] [place] or [phone]    Mobile Crisis Teams Organization         Address     Phone             Notes  [facility], Mobile Crisis Care Unit  [phone]   [facility]  [address] [place], [place]   [place] [address], [place] [phone]    Self-Help/Support Groups Organization         Address     Phone             Notes  [place]. of [place] - variety of support groups  [place] Call for more information  Narcotics Anonymous (NA), [facility] [address], [employer] [place]  2 meetings at this location   Special educational needs teacher         Address     Phone             Notes  ASAP Residential Treatment [place],    [place]  [phone]   [facility]  [address], [place] [phone], [place], [place]   [place], [place] [phone] Admissions: 8am-3pm M-F  Incentives Substance [place] [address],    [place], [place] [phone]   [facility] [address] [place], [place], [place]   [facility] [address]  [place], [place]   [facility] Programs - Intensive Outpatient [address]een [address], [place], [place]   [facility] ([place] [address] [address]  [place], [place] [phone] or [phone]   [facility] ([facility]) [address], [place], [place] Accepts Medicaid  [facility] [place] [address]  [place] [phone] Substance Abuse/Addiction Treatment   [facility] Organization         Address     Phone             Notes  [facility]  [phone]   Domenic Schwab, PhD [address] [place], [place]   [phone] or [phone]   [place], [place] [phone]   [place] [address], [place], [place] [phone] Insurance/Medicaid/sponsorship through [facility] and Families [address] [place]                                    [place], [place] [phone] [place] [address] [place] [phone]    Dr. Adele Schilder  [phone]   [facility] Dept. 1) [address] [place] 2) [place] 3)  [place] [address], [place] [phone]  [phone]   [place] [phone] or [phone] (After Hours)

## 2014-06-15 NOTE — ED Notes (Signed)
Per EMS- Patient c/o RUQ pain and N/V since 0600. Patient also reports that she starated  Her period today. Patient has a history of sickle cell. Patient did not take any of her morning meds.

## 2014-06-15 NOTE — ED Provider Notes (Signed)
CSN: [id number]     Arrival date & time 06/15/14  2029 History   First MD Initiated Contact with Patient 06/15/14 2245     Chief Complaint  Patient presents with  . Abdominal Pain     (Consider location/radiation/quality/duration/timing/severity/associated sxs/prior Treatment) HPI Comments: Patient presents to the ER for abdominal pain. Patient was seen at [facility] long for the same problem today. She reports that she received medication and was discharged. After she left she started having nausea and vomiting again.  Since significant other also reports that the patient has a Percocet addiction and he would like help for her with detox from [place].  Patient is a 38 y.o. female presenting with abdominal pain.  Abdominal Pain Associated symptoms: nausea and vomiting     Past Medical History  Diagnosis Date  . Tongue cancer   . Diabetes mellitus without complication   . Sickle cell disease   . Diabetes mellitus   . Headache(784.0)   . Seasonal allergies   . Abnormal Pap smear   . Gout   . Depression   . Anxiety   . UTI (urinary tract infection)   . Bipolar 1 disorder   . Kidney calculi   . Anemia   . Purple toe syndrome     patient has a slightly swollen purplish discolored right fourth toe   Past Surgical History  Procedure Laterality Date  . Cholecystectomy    . Esophagogastroduodenoscopy N/A 09/07/2012    Procedure: ESOPHAGOGASTRODUODENOSCOPY (EGD);  Surgeon: Juanita Craver, MD;  Location: [facility] ENDOSCOPY;  Service: Endoscopy;  Laterality: N/A;  . Cesarean section     Family History  Problem Relation Age of Onset  . Hypertension Mother   . Diabetes Mother   . Hypertension Maternal Aunt   . Diabetes Maternal Aunt   . Hypertension Maternal Uncle   . Diabetes Maternal Uncle   . Hypertension Maternal Grandmother    History  Substance Use Topics  . Smoking status: Current Every Day Smoker -- 0.50 packs/day for 25 years    Types: Cigarettes  . Smokeless tobacco: Never  Used  . Alcohol Use: No   OB History    Gravida Para Term Preterm AB TAB SAB Ectopic Multiple Living   8 8 7 1  0  0   7     Review of Systems  Gastrointestinal: Positive for nausea, vomiting and abdominal pain.  All other systems reviewed and are negative.     Allergies  Reglan; Bentyl; Compazine; Ketorolac; Morphine and related; and Zofran  Home Medications   Prior to Admission medications   Medication Sig Start Date End Date Taking? Authorizing Provider  alprazolam Duanne Moron) 2 MG tablet Take 2 mg by mouth 3 (three) times daily as needed for sleep or anxiety.   Yes Historical Provider, MD  amphetamine-dextroamphetamine (ADDERALL) 30 MG tablet Take 30 mg by mouth 2 (two) times daily.    Yes Historical Provider, MD  insulin aspart (NOVOLOG) 100 UNIT/ML injection Inject 10-20 Units into the skin 3 (three) times daily before meals. Sliding scale 05/07/13  Yes Shanker Kristeen Mans, MD  Insulin Glargine (LANTUS SOLOSTAR) 100 UNIT/ML SOPN Inject 80 Units into the skin at bedtime. 05/07/13  Yes Shanker Kristeen Mans, MD  oxyCODONE-acetaminophen (PERCOCET) 10-325 MG per tablet Take 1 tablet by mouth every 6 (six) hours as needed for pain. 03/12/13  Yes Robbie Lis, MD  dicyclomine (BENTYL) 20 MG tablet Take 1 tablet (20 mg total) by mouth 2 (two) times daily. Patient not taking:  Reported on 06/15/2014 06/15/14   Britt Bottom, NP  promethazine (PHENERGAN) 25 MG suppository Place 1 suppository (25 mg total) rectally every 6 (six) hours as needed for nausea or vomiting. Patient not taking: Reported on 06/15/2014 03/19/14   Johnna Acosta, MD  promethazine (PHENERGAN) 25 MG tablet Take 1 tablet (25 mg total) by mouth every 6 (six) hours as needed for nausea or vomiting. Patient not taking: Reported on 06/15/2014 03/19/14   Johnna Acosta, MD  promethazine (PHENERGAN) 25 MG tablet Take 1 tablet (25 mg total) by mouth every 6 (six) hours as needed for nausea or vomiting. Patient not taking: Reported  on 06/15/2014 03/19/14   Johnna Acosta, MD  promethazine (PHENERGAN) 25 MG tablet Take 1 tablet (25 mg total) by mouth every 6 (six) hours as needed for nausea or vomiting. 06/15/14   Britt Bottom, NP   BP 191/89 mmHg  Pulse 98  Temp(Src) 98.3 F (36.8 C) (Oral)  Resp 20  Ht 5\' 6"  (1.676 m)  Wt 200 lb (90.719 kg)  BMI 32.30 kg/m2  SpO2 100%  LMP 06/15/2014 Physical Exam  Constitutional: She is oriented to person, place, and time. She appears well-developed and well-nourished. No distress.  HENT:  Head: Normocephalic and atraumatic.  Right Ear: Hearing normal.  Left Ear: Hearing normal.  Nose: Nose normal.  Mouth/Throat: Oropharynx is clear and moist and mucous membranes are normal.  Eyes: Conjunctivae and EOM are normal. Pupils are equal, round, and reactive to light.  Neck: Normal range of motion. Neck supple.  Cardiovascular: Regular rhythm, S1 normal and S2 normal.  Exam reveals no gallop and no friction rub.   No murmur heard. Pulmonary/Chest: Effort normal and breath sounds normal. No respiratory distress. She exhibits no tenderness.  Abdominal: Soft. Normal appearance and bowel sounds are normal. There is no hepatosplenomegaly. There is tenderness. There is no rebound, no guarding, no tenderness at McBurney's point and negative Murphy's sign. No hernia.  Musculoskeletal: Normal range of motion.  Neurological: She is alert and oriented to person, place, and time. She has normal strength. No cranial nerve deficit or sensory deficit. Coordination normal. GCS eye subscore is 4. GCS verbal subscore is 5. GCS motor subscore is 6.  Skin: Skin is warm, dry and intact. No rash noted. No cyanosis.  Psychiatric: She has a normal mood and affect. Her speech is normal and behavior is normal. Thought content normal.  Nursing note and vitals reviewed.   ED Course  Procedures (including critical care time) Labs Review Labs Reviewed  CBG MONITORING, ED - Abnormal; Notable for the  following:    Glucose-Capillary 273 (*)    All other components within normal limits    Imaging Review No results found.   EKG Interpretation None      MDM   Final diagnoses:  None   abdominal pain  Vomiting  Patient presents to the ER for evaluation of abdominal pain. Patient literally was just discharged from [facility] for evaluation of the same problem and called an ambulance to be brought to this ER. I suspect she did this because she did not get any narcotic analgesia at [facility]. Patient well-known to the ER for multiple visits. Lab work was unremarkable at previous visit. She does not require any further workup. Patient reports that she is still vomiting, will be treated with Haldol. She is allergic to most antibiotics. I suspect that patient might be having some withdrawal symptoms, has not had any Percocet since this  morning. She has elevated blood pressure but otherwise vital signs are stable. Normal oxygenation, no fever. Patient treated with clonidine for blood pressure and possible narcotic withdrawal.    Orpah Greek, MD 06/15/14 2326

## 2014-06-15 NOTE — ED Notes (Signed)
Patient was being escorted to discharge window and pushed Probation officer and waas heading to the physician's office. Patient instructed that she could not go back into the physician's office again. According to the [facility] patient had been in the office x 2. Dr. Vanita Panda notified. Dr. Vanita Panda talked with patient in her room and patient left after receiving reprinted discharge instructions.

## 2014-06-15 NOTE — ED Notes (Signed)
Bed: KC12 Expected date:  Expected time:  Means of arrival:  Comments: EMS- abdominal pain, n/v

## 2014-06-15 NOTE — ED Provider Notes (Signed)
CSN: [id number]     Arrival date & time 06/15/14  1458 History   First MD Initiated Contact with Patient 06/15/14 1506     Chief Complaint  Patient presents with  . Abdominal Pain  . Emesis   (Consider location/radiation/quality/duration/timing/severity/associated sxs/prior Treatment) HPI  [name] is a 38 yo female presenting with report of nausea and vomiting onset this am.  She also reports RUQ abd pain onset this am also. Nothing seems to make the symptoms better or worse. She reports feeling chills at times when vomiting but denies any measured fever.  She describes the pain as sharp and rates as 10/10.  She is currently on her menstrual cycle.  Her last bowel movement was this am and was normal.  She denies any bloody/bilious emesis, or diarrhea.  She reports a history of kidney stones in the past and reports she is allergic to " a lot" and the only med that works for her "starts with a D."  Past Medical History  Diagnosis Date  . Tongue cancer   . Diabetes mellitus without complication   . Sickle cell disease   . Diabetes mellitus   . Headache(784.0)   . Seasonal allergies   . Abnormal Pap smear   . Gout   . Depression   . Anxiety   . UTI (urinary tract infection)   . Bipolar 1 disorder   . Kidney calculi   . Anemia   . Purple toe syndrome     patient has a slightly swollen purplish discolored right fourth toe   Past Surgical History  Procedure Laterality Date  . Cholecystectomy    . Esophagogastroduodenoscopy N/A 09/07/2012    Procedure: ESOPHAGOGASTRODUODENOSCOPY (EGD);  Surgeon: Juanita Craver, MD;  Location: [facility] ENDOSCOPY;  Service: Endoscopy;  Laterality: N/A;  . Cesarean section     Family History  Problem Relation Age of Onset  . Hypertension Mother   . Diabetes Mother   . Hypertension Maternal Aunt   . Diabetes Maternal Aunt   . Hypertension Maternal Uncle   . Diabetes Maternal Uncle   . Hypertension Maternal Grandmother    History  Substance Use  Topics  . Smoking status: Current Every Day Smoker -- 0.50 packs/day for 25 years    Types: Cigarettes  . Smokeless tobacco: Never Used  . Alcohol Use: No   OB History    Gravida Para Term Preterm AB TAB SAB Ectopic Multiple Living   8 8 7 1  0  0   7     Review of Systems  Constitutional: Positive for chills. Negative for fever.  HENT: Negative for sore throat.   Eyes: Negative for visual disturbance.  Respiratory: Negative for cough and shortness of breath.   Cardiovascular: Negative for chest pain and leg swelling.  Gastrointestinal: Positive for nausea, vomiting and abdominal pain. Negative for diarrhea.  Genitourinary: Negative for dysuria.  Musculoskeletal: Negative for myalgias.  Skin: Negative for rash.  Neurological: Negative for weakness, numbness and headaches.    Allergies  Reglan; Ketorolac; and Zofran  Home Medications   Prior to Admission medications   Medication Sig Start Date End Date Taking? Authorizing Provider  alprazolam Duanne Moron) 2 MG tablet Take 2 mg by mouth 3 (three) times daily as needed for sleep or anxiety.    Historical Provider, MD  amphetamine-dextroamphetamine (ADDERALL) 30 MG tablet Take 30 mg by mouth 2 (two) times daily.     Historical Provider, MD  insulin aspart (NOVOLOG) 100 UNIT/ML injection Inject 10-20  Units into the skin 3 (three) times daily before meals. Sliding scale 05/07/13   Jonetta Osgood, MD  Insulin Glargine (LANTUS SOLOSTAR) 100 UNIT/ML SOPN Inject 80 Units into the skin at bedtime. 05/07/13   Shanker Kristeen Mans, MD  oxyCODONE-acetaminophen (PERCOCET) 10-325 MG per tablet Take 1 tablet by mouth every 6 (six) hours as needed for pain. 03/12/13   Robbie Lis, MD  promethazine (PHENERGAN) 25 MG suppository Place 1 suppository (25 mg total) rectally every 6 (six) hours as needed for nausea or vomiting. 03/19/14   Johnna Acosta, MD  promethazine (PHENERGAN) 25 MG tablet Take 1 tablet (25 mg total) by mouth every 6 (six) hours as  needed for nausea or vomiting. 03/19/14   Johnna Acosta, MD  promethazine (PHENERGAN) 25 MG tablet Take 1 tablet (25 mg total) by mouth every 6 (six) hours as needed for nausea or vomiting. 03/19/14   Johnna Acosta, MD   There were no vitals taken for this visit. Physical Exam  Constitutional: She is oriented to person, place, and time. She appears well-developed and well-nourished. No distress.  HENT:  Head: Normocephalic and atraumatic.  Mouth/Throat: Oropharynx is clear and moist. No oropharyngeal exudate.  Eyes: Conjunctivae are normal.  Neck: Neck supple. No thyromegaly present.  Cardiovascular: Normal rate, regular rhythm and intact distal pulses.   Pulmonary/Chest: Effort normal and breath sounds normal. No respiratory distress. She has no wheezes. She has no rales. She exhibits no tenderness.  Abdominal: Soft. Bowel sounds are normal. She exhibits no distension and no mass. There is generalized tenderness. There is no rigidity, no rebound, no guarding, no CVA tenderness, no tenderness at McBurney's point and negative Murphy's sign.  Musculoskeletal: She exhibits no tenderness.  Lymphadenopathy:    She has no cervical adenopathy.  Neurological: She is alert and oriented to person, place, and time.  Skin: Skin is warm and dry. No rash noted. She is not diaphoretic.  Psychiatric: She has a normal mood and affect.  Nursing note and vitals reviewed.   ED Course  Procedures (including critical care time) Labs Review Labs Reviewed  CBC WITH DIFFERENTIAL - Abnormal; Notable for the following:    WBC 12.6 (*)    RBC 6.02 (*)    MCV 67.9 (*)    MCH 22.1 (*)    RDW 19.0 (*)    Neutrophils Relative % 92 (*)    Lymphocytes Relative 6 (*)    Monocytes Relative 2 (*)    Neutro Abs 11.5 (*)    All other components within normal limits  COMPREHENSIVE METABOLIC PANEL - Abnormal; Notable for the following:    Sodium 134 (*)    Glucose, Bld 267 (*)    Total Protein 8.7 (*)    All other  components within normal limits  URINALYSIS, ROUTINE W REFLEX MICROSCOPIC - Abnormal; Notable for the following:    APPearance CLOUDY (*)    Specific Gravity, Urine 1.035 (*)    Glucose, UA >1000 (*)    Hgb urine dipstick MODERATE (*)    Ketones, ur 40 (*)    Protein, ur >300 (*)    All other components within normal limits  URINE MICROSCOPIC-ADD ON - Abnormal; Notable for the following:    Bacteria, UA FEW (*)    All other components within normal limits  LIPASE, BLOOD    Imaging Review No results found.   EKG Interpretation None      MDM   Final diagnoses:  Generalized abdominal  pain   38 yo with recurrent n/v/abd pain.  She has 7 ED visits in the last 6 months.  Her last abd CT was 3 months ago with no acute abnormality.  It did show a 1 mm stone that was non-obstructing at that time.  Will check labs, give IV fluids and meds for nausea, but will avoid opioids for pain. Discussed case with Dr. Vanita Panda. Pt is well-appearing throughout her visit in the ED, ambulating around department, skin is warm and dry, no acute distress noted.  Pt received IV fluids and Iv phenergan.  Labs reviewed, WBC mildly elevated 12.6, but pt afebrile throughout ED visit.  Despite reports of vomiting, pt has no anion gap and only significant abnormality is blood sugar of 267, but pt has not taken her blood pressure or diabetes meds today.   Dicyclomine IM ordered for pain prior to discharge. Pt is well-appearing, in no acute distress and vital signs are stable.  They appear safe to be discharged.  Discharge include follow-up with their PCP.  Prescriptions for bentyl PO and Phenergan provided. Return precautions provided.    Filed Vitals:   06/15/14 1526 06/15/14 1728  BP: 175/84 157/75  Pulse: 78 70  Temp: 97.9 F (36.6 C) 98.3 F (36.8 C)  TempSrc: Oral Oral  Resp: 16 16  SpO2: 99% 100%   Meds given in ED:  Medications  sodium chloride 0.9 % bolus 1,000 mL (1,000 mLs Intravenous New  Bag/Given 06/15/14 1635)  dicyclomine (BENTYL) injection 20 mg (not administered)  promethazine (PHENERGAN) injection 25 mg (25 mg Intravenous Given 06/15/14 1708)  sodium chloride 0.9 % 50 mL with promethazine (PHENERGAN) 25 mg infusion ( Intravenous Stopped 06/15/14 1708)    New Prescriptions   DICYCLOMINE (BENTYL) 20 MG TABLET    Take 1 tablet (20 mg total) by mouth 2 (two) times daily.   PROMETHAZINE (PHENERGAN) 25 MG TABLET    Take 1 tablet (25 mg total) by mouth every 6 (six) hours as needed for nausea or vomiting.      Britt Bottom, NP 06/16/14 1131  Carmin Muskrat, MD 06/16/14 [phone]

## 2014-06-15 NOTE — Discharge Instructions (Signed)
Abdominal Pain °Many things can cause abdominal pain. Usually, abdominal pain is not caused by a disease and will improve without treatment. It can often be observed and treated at home. Your health care provider will do a physical exam and possibly order blood tests and X-rays to help determine the seriousness of your pain. However, in many cases, more time must pass before a clear cause of the pain can be found. Before that point, your health care provider may not know if you need more testing or further treatment. °HOME CARE INSTRUCTIONS  °Monitor your abdominal pain for any changes. The following actions may help to alleviate any discomfort you are experiencing: °· Only take over-the-counter or prescription medicines as directed by your health care provider. °· Do not take laxatives unless directed to do so by your health care provider. °· Try a clear liquid diet (broth, tea, or water) as directed by your health care provider. Slowly move to a bland diet as tolerated. °SEEK MEDICAL CARE IF: °· You have unexplained abdominal pain. °· You have abdominal pain associated with nausea or diarrhea. °· You have pain when you urinate or have a bowel movement. °· You experience abdominal pain that wakes you in the night. °· You have abdominal pain that is worsened or improved by eating food. °· You have abdominal pain that is worsened with eating fatty foods. °· You have a fever. °SEEK IMMEDIATE MEDICAL CARE IF:  °· Your pain does not go away within 2 hours. °· You keep throwing up (vomiting). °· Your pain is felt only in portions of the abdomen, such as the right side or the left lower portion of the abdomen. °· You pass bloody or black tarry stools. °MAKE SURE YOU: °· Understand these instructions.   °· Will watch your condition.   °· Will get help right away if you are not doing well or get worse.   °Document Released: 03/21/2005 Document Revised: 06/16/2013 Document Reviewed: 02/18/2013 °ExitCare® Patient Information  ©2015 ExitCare, [employer]. This information is not intended to replace advice given to you by your health care provider. Make sure you discuss any questions you have with your health care provider. ° °Nausea and Vomiting °Nausea is a sick feeling that often comes before throwing up (vomiting). Vomiting is a reflex where stomach contents come out of your mouth. Vomiting can cause severe loss of body fluids (dehydration). Children and elderly adults can become dehydrated quickly, especially if they also have diarrhea. Nausea and vomiting are symptoms of a condition or disease. It is important to find the cause of your symptoms. °CAUSES  °· Direct irritation of the stomach lining. This irritation can result from increased acid production (gastroesophageal reflux disease), infection, food poisoning, taking certain medicines (such as nonsteroidal anti-inflammatory drugs), alcohol use, or tobacco use. °· Signals from the brain. These signals could be caused by a headache, heat exposure, an inner ear disturbance, increased pressure in the brain from injury, infection, a tumor, or a concussion, pain, emotional stimulus, or metabolic problems. °· An obstruction in the gastrointestinal tract (bowel obstruction). °· Illnesses such as diabetes, hepatitis, gallbladder problems, appendicitis, kidney problems, cancer, sepsis, atypical symptoms of a heart attack, or eating disorders. °· Medical treatments such as chemotherapy and radiation. °· Receiving medicine that makes you sleep (general anesthetic) during surgery. °DIAGNOSIS °Your caregiver may ask for tests to be done if the problems do not improve after a few days. Tests may also be done if symptoms are severe or if the reason for the nausea   and vomiting is not clear. Tests may include:  Urine tests.  Blood tests.  Stool tests.  Cultures (to look for evidence of infection).  X-rays or other imaging studies. Test results can help your caregiver make decisions about  treatment or the need for additional tests. TREATMENT You need to stay well hydrated. Drink frequently but in small amounts.You may wish to drink water, sports drinks, clear broth, or eat frozen ice pops or gelatin dessert to help stay hydrated.When you eat, eating slowly may help prevent nausea.There are also some antinausea medicines that may help prevent nausea. HOME CARE INSTRUCTIONS   Take all medicine as directed by your caregiver.  If you do not have an appetite, do not force yourself to eat. However, you must continue to drink fluids.  If you have an appetite, eat a normal diet unless your caregiver tells you differently.  Eat a variety of complex carbohydrates (rice, wheat, potatoes, bread), lean meats, yogurt, fruits, and vegetables.  Avoid high-fat foods because they are more difficult to digest.  Drink enough water and fluids to keep your urine clear or pale yellow.  If you are dehydrated, ask your caregiver for specific rehydration instructions. Signs of dehydration may include:  Severe thirst.  Dry lips and mouth.  Dizziness.  Dark urine.  Decreasing urine frequency and amount.  Confusion.  Rapid breathing or pulse. SEEK IMMEDIATE MEDICAL CARE IF:   You have blood or brown flecks (like coffee grounds) in your vomit.  You have black or bloody stools.  You have a severe headache or stiff neck.  You are confused.  You have severe abdominal pain.  You have chest pain or trouble breathing.  You do not urinate at least once every 8 hours.  You develop cold or clammy skin.  You continue to vomit for longer than 24 to 48 hours.  You have a fever. MAKE SURE YOU:   Understand these instructions.  Will watch your condition.  Will get help right away if you are not doing well or get worse. Document Released: 06/11/2005 Document Revised: 09/03/2011 Document Reviewed: 11/08/2010 [facility] Patient Information 2015 [place], [place]. This information is not  intended to replace advice given to you by your health care provider. Make sure you discuss any questions you have with your health care provider.   Emergency Department Resource Guide 1) Find a Doctor and Pay Out of Pocket Although you won't have to find out who is covered by your insurance plan, it is a good idea to ask around and get recommendations. You will then need to call the office and see if the doctor you have chosen will accept you as a new patient and what types of options they offer for patients who are self-pay. Some doctors offer discounts or will set up payment plans for their patients who do not have insurance, but you will need to ask so you aren't surprised when you get to your appointment.  2) Contact Your Local Health Department Not all health departments have doctors that can see patients for sick visits, but many do, so it is worth a call to see if yours does. If you don't know where your local health department is, you can check in your phone book. The CDC also has a tool to help you locate your state's health department, and many state websites also have listings of all of their local health departments.  3) Find a [facility] If your illness is not likely to be very severe  or complicated, you may want to try a walk in clinic. These are popping up all over the country in pharmacies, drugstores, and shopping centers. They're usually staffed by nurse practitioners or physician assistants that have been trained to treat common illnesses and complaints. They're usually fairly quick and inexpensive. However, if you have serious medical issues or chronic medical problems, these are probably not your best option.  No Primary Care Doctor: - Call Health Connect at  [phone] - they can help you locate a primary care doctor that  accepts your insurance, provides certain services, etc. - Physician Referral Service- [phone]  Chronic Pain Problems: Organization          Address  Phone   Notes  [facility]  [phone] Patients need to be referred by their primary care doctor.   Medication Assistance: Organization         Address  Phone   Notes  [facility] Medication [facility] [address] [place], [place] [address] [phone] --Must be a resident of [facility] -- Must have NO insurance coverage whatsoever (no Medicaid/ Medicare, etc.) -- The pt. MUST have a primary care doctor that directs their care regularly and follows them in the community   [facility]ist  [phone]   [employer]  [phone]    Agencies that provide inexpensive medical care: Organization         Address  Phone   Notes  [place]  [phone]   [facility]    [phone]   [facility] [place], [place] [address] [phone]   [place] [address] [place] [phone]   Planned Parenthood    [phone]   [facility]    [phone]   [place] and [place] [address], [place] Phone:  [phone], Fax:  [phone] Hours of Operation:  9 am - 6 pm, M-F.  Also accepts Medicaid/Medicare and self-pay.  [facility] for [place], [address], [place] Phone: [phone], Fax: [phone]. Hours of Operation:  8:30 am - 5:30 pm, M-F.  Also accepts Medicaid and self-pay.  [facility] [address] [place] Phone: [phone]   [place], [place], [place] [phone], Ext. [phone] Mondays & Thursdays: 7-9 AM.  First 15 patients are seen on a first come, first serve basis.    [place] Providers:  Organization         Address  Phone   Notes  [facility], [place] [address], [place] [phone] Also accepts self-pay patients.  [facility] [address], [place]  [phone]   [facility], [address], [place] [phone]   [facility] [address], [place] [phone]   Lucianne Lei [address], [place]   [phone] Only accepts [place] Access [place] patients after they have their name applied to their card.   Self-Pay (no insurance) in [facility]:  Organization         Address  Phone   Notes  Sickle Cell Patients, [facility] [place] [phone]   [facility] [address]  [place] [phone]   [facility] [place]  [place], [place], [place] [phone]   [facility]/Dr. Osei-Bonsu  [address], [place] or [address], [place] [phone] Phone number for both [place] and [place] locations is the same.  [facility] [address], [place] [phone]   [facility] [address] [place] or [address] [phone]   [facility] [address] [place] [phone], phone; [phone], fax Sees patients 1st and 3rd Saturday of every month.  Must not qualify for public or private insurance (i.e. Medicaid, Medicare, [place] Health Choice, Veterans' Benefits)  Household income should be no more than 200% of the poverty level The clinic cannot treat you if you are pregnant or think you are pregnant  Sexually transmitted diseases are not treated at the clinic.    Dental Care: Organization         Address  Phone  Notes  [facility] Department of [facility] [phone] Accepts children up to age 18 who are enrolled in [place] or [place]; pregnant women with a Medicaid card; and  children who have applied for Medicaid or [place] Health Choice, but were declined, whose parents can pay a reduced fee at time of service.  [facility] Department of [facility]  [address], [place] [phone] Accepts children up to age 69 who are enrolled in [place] or [place]; pregnant women with a Medicaid card; and children who have applied for Medicaid or [place] Health Choice, but were declined, whose parents can pay a reduced fee at time of service.  [place] Adult Dental Access PROGRAM  [place] [phone] Patients are seen by appointment only. Walk-ins are not accepted. [place] will see patients 40 years of age and older. Monday - Tuesday (8am-5pm) Most Wednesdays (8:30-5pm) $30 per visit, cash only  [facility] Adult Dental Access PROGRAM  [address], [facility] [phone] Patients are seen by appointment only. Walk-ins are not accepted. [place] will see patients 8 years of age and older. One Wednesday Evening (Monthly: Volunteer Based).  $30 per visit, cash only  [place]  [phone] for adults; Children under age 29, call Graduate Pediatric Dentistry at [phone]. Children aged 74-14, please call [phone] to request a pediatric application.  Dental services are provided in all areas of dental care including fillings, crowns and bridges, complete and partial dentures, implants, gum treatment, root canals, and extractions. Preventive care is also provided. Treatment is provided to both adults and children. Patients are selected via a lottery and there is often a waiting list.   [facility] [address] [place]  [phone] [email or web address]   [facility] [address] [place], [place] [phone], Ext. [phone] Second and Fourth Thursday of each month, opens at 6:30 AM; Clinic ends at 9 AM.  Patients are seen on a first-come first-served  basis, and a limited number are seen during each clinic.   [facility]  [address] Hillard Danker [place], [place] [phone]   Eligibility Requirements You must have lived in [place], [place], or [place] counties for at least the last three months.   You cannot  be eligible for state or federal sponsored [employer], including [employer], [place], or [employer].   You generally cannot be eligible for healthcare insurance through your employer.    How to apply: Eligibility screenings are held every Tuesday and Wednesday afternoon from 1:00 pm until 4:00 pm. You do not need an appointment for the interview!  [facility] [address], [place], [place]   [place]  [place] Department  [place]  [phone]    Behavioral Health Resources in the Community: Intensive Outpatient Programs Organization         Address  Phone  Notes  [place]. [address] [place], [place] [phone]   [facility]patient [address], [place], [place]   ADS: Alcohol & Drug Svcs [address] [place], [place]   [place] [address],  [place], [place] or [phone]   Substance Abuse Resources Organization         Address  Phone  Notes  Alcohol and Drug Services  [phone]   [place]  [phone]   [facility]   Chinita Pester  [phone]   Residential & Outpatient Substance Abuse Program  [phone]   Psychological Services Organization         Address  Phone  Notes  [facility] [place]  [place]  [phone]   [place] [address], [place] or [phone]    Mobile Crisis Teams Organization          Address  Phone  Notes  [facility], Mobile Crisis Care Unit  [phone]   [facility]  [address] [place], [place]   [place] [address] [place] [phone]    Self-Help/Support Groups Organization         Address  Phone             Notes  [place]. of [place] - variety of support groups  [place] Call for more information  Narcotics Anonymous (NA), [facility] [address], [employer] [place]  2 meetings at this location   Special educational needs teacher         Address  Phone  Notes  [facility] Residential Treatment [place],    [place]  [phone]   [facility]  [address], [place] [address], [place], [place]   [place], [place] [phone] Admissions: 8am-3pm M-F  Incentives Substance [place] [address]    [place], [place] [phone]   [facility] [address] [place], [place], [place]   [facility] [address],  [place], [place]   [facility] Programs - Intensive Outpatient [address], [place], [place]   [facility] ([place] [place].,  [place], [place] [phone] or [phone]   [facility] (RTS) [address] [place], [place] Accepts Medicaid  [facility] [place] [address],  [place] [phone] Substance Abuse/Addiction Treatment   [facility] Organization         Address  Phone  Notes  [facility]  [phone]   Domenic Schwab, PhD [place]  [place], [place]   [phone] or [phone]   [facility]   [address] [place], [place] [phone]   [address], [place], [place] [phone] Insurance/Medicaid/sponsorship  through [facility] and Families [address]                                    [place], [place] [phone] [place] [address] [place], [place] [phone]    Dr. Adele Schilder  [phone]   [facility] Dept. 1) [address], [place] 2) [place] 3)  [address], [place] [phone]  [phone]   [place] [phone] or [phone] (After Hours)

## 2014-06-15 NOTE — ED Notes (Signed)
Pt. reports lower abdominal pain with nausea and vomitting , pt. was just seen at [facility] ER for the same complaints  , blood tests and urine test done and  discharged with prescriptions.

## 2014-06-15 NOTE — ED Notes (Signed)
Attempts x 2 to start IV unable to do so.Charge nurse notified Will place IV Team consult.

## 2014-09-28 ENCOUNTER — Emergency Department (HOSPITAL_COMMUNITY)
Admission: EM | Admit: 2014-09-28 | Discharge: 2014-09-28 | Discharge status: Against Medical Advice | Payer: Medicare Other

## 2014-11-18 ENCOUNTER — Encounter: Payer: Self-pay | Admitting: Emergency Medicine

## 2014-11-18 ENCOUNTER — Emergency Department
Admission: EM | Admit: 2014-11-18 | Discharge: 2014-11-18 | Disposition: A | Discharge status: Home / Self Care | Payer: Medicare Other | Attending: Emergency Medicine | Admitting: Emergency Medicine

## 2014-11-18 DIAGNOSIS — I10 Essential (primary) hypertension: Secondary | ICD-10-CM | POA: Insufficient documentation

## 2014-11-18 DIAGNOSIS — Z79899 Other long term (current) drug therapy: Secondary | ICD-10-CM | POA: Diagnosis not present

## 2014-11-18 DIAGNOSIS — E1140 Type 2 diabetes mellitus with diabetic neuropathy, unspecified: Secondary | ICD-10-CM | POA: Insufficient documentation

## 2014-11-18 DIAGNOSIS — M79672 Pain in left foot: Secondary | ICD-10-CM | POA: Insufficient documentation

## 2014-11-18 DIAGNOSIS — D5700 Hb-SS disease with crisis, unspecified: Secondary | ICD-10-CM | POA: Insufficient documentation

## 2014-11-18 DIAGNOSIS — F1120 Opioid dependence, uncomplicated: Secondary | ICD-10-CM | POA: Diagnosis not present

## 2014-11-18 DIAGNOSIS — Z794 Long term (current) use of insulin: Secondary | ICD-10-CM | POA: Diagnosis not present

## 2014-11-18 DIAGNOSIS — Z765 Malingerer [conscious simulation]: Secondary | ICD-10-CM | POA: Insufficient documentation

## 2014-11-18 DIAGNOSIS — R109 Unspecified abdominal pain: Secondary | ICD-10-CM | POA: Diagnosis present

## 2014-11-18 MED ORDER — PROMETHAZINE HCL 25 MG PO TABS
ORAL_TABLET | ORAL | Status: AC
  Filled 2014-11-18: qty 1

## 2014-11-18 MED ORDER — PROMETHAZINE HCL 25 MG PO TABS: 25.0000 mg | ORAL_TABLET | Freq: Once | ORAL | Status: DC

## 2014-11-18 NOTE — ED Notes (Signed)
MD made aware of pt's HR at time of discharger. Per Jimmye Norman, pt cleared for discharged home at this time. Pt ambulatory without difficulty towards lobby.

## 2014-11-18 NOTE — Discharge Instructions (Signed)
Polysubstance Abuse °When people abuse more than one drug or type of drug it is called polysubstance or polydrug abuse. For example, many smokers also drink alcohol. This is one form of polydrug abuse. Polydrug abuse also refers to the use of a drug to counteract an unpleasant effect produced by another drug. It may also be used to help with withdrawal from another drug. People who take stimulants may become agitated. Sometimes this agitation is countered with a tranquilizer. This helps protect against the unpleasant side effects. Polydrug abuse also refers to the use of different drugs at the same time.  °Anytime drug use is interfering with normal living activities, it has become abuse. This includes problems with family and friends. Psychological dependence has developed when your mind tells you that the drug is needed. This is usually followed by physical dependence which has developed when continuing increases of drug are required to get the same feeling or "high". This is known as addiction or chemical dependency. A person's risk is much higher if there is a history of chemical dependency in the family. °SIGNS OF CHEMICAL DEPENDENCY °· You have been told by friends or family that drugs have become a problem. °· You fight when using drugs. °· You are having blackouts (not remembering what you do while using). °· You feel sick from using drugs but continue using. °· You lie about use or amounts of drugs (chemicals) used. °· You need chemicals to get you going. °· You are suffering in work performance or in school because of drug use. °· You get sick from use of drugs but continue to use anyway. °· You need drugs to relate to people or feel comfortable in social situations. °· You use drugs to forget problems. °"Yes" answered to any of the above signs of chemical dependency indicates there are problems. The longer the use of drugs continues, the greater the problems will become. °If there is a family history of  drug or alcohol use, it is best not to experiment with these drugs. Continual use leads to tolerance. After tolerance develops more of the drug is needed to get the same feeling. This is followed by addiction. With addiction, drugs become the most important part of life. It becomes more important to take drugs than participate in the other usual activities of life. This includes relating to friends and family. Addiction is followed by dependency. Dependency is a condition where drugs are now needed not just to get high, but to feel normal. °Addiction cannot be cured but it can be stopped. This often requires outside help and the care of professionals. Treatment centers are listed in the yellow pages under: Cocaine, Narcotics, and Alcoholics Anonymous. Most hospitals and clinics can refer you to a specialized care center. Talk to your caregiver if you need help. °Document Released: 01/31/2005 Document Revised: 09/03/2011 Document Reviewed: 06/11/2005 °ExitCare® Patient Information ©2015 ExitCare, LLC. This information is not intended to replace advice given to you by your health care provider. Make sure you discuss any questions you have with your health care provider. ° °

## 2014-11-18 NOTE — BHH Counselor (Signed)
Per request of ER MD (Dr. Jimmye Norman), writer provided the pt. with information and instructions on how to access Out Pt. Mental Health Treatment ([facility] and [employer]).

## 2014-11-18 NOTE — ED Provider Notes (Signed)
[facility] Emergency Department Provider Note     Time seen: ----------------------------------------- 7:07 PM on 11/18/2014 -----------------------------------------    I have reviewed the triage vital signs and the nursing notes.   HISTORY  Chief Complaint Abdominal Pain; Emesis; and Sickle Cell Pain Crisis    HPI [name] is a 39 y.o. female who presents ER for vomiting and abdominal pain.Reportedly with a history of sickle cell disease. Patient reports of pain all over her body, rule reported history of vomiting as well as inability to keep anything down. She also has pain in her left foot from our cinderblock crushed it. States it's Black. Pain is severe tablet 10 nothing makes it better or worse.   Past Medical History  Diagnosis Date  . Tongue cancer   . Diabetes mellitus without complication   . Sickle cell disease   . Diabetes mellitus   . Headache(784.0)   . Seasonal allergies   . Abnormal Pap smear   . Gout   . Depression   . Anxiety   . UTI (urinary tract infection)   . Bipolar 1 disorder   . Kidney calculi   . Anemia   . Purple toe syndrome     patient has a slightly swollen purplish discolored right fourth toe    Patient Active Problem List   Diagnosis Date Noted  . Constipation due to opioid therapy 04/28/2014  . Nausea and vomiting in adult   . Cellulitis, abdominal wall 04/27/2014  . Intractable nausea and vomiting 04/27/2014  . Drug-seeking behavior 04/27/2014  . Broken toe 03/30/2014  . Abdominal pain, other specified site 04/12/2013  . Diabetes mellitus, new onset 04/12/2013  . Diarrhea 04/12/2013  . Clostridium difficile colitis 03/22/2013  . HTN (hypertension) 03/22/2013  . History of MRSA infection with recurrent vulvar abscesses 03/10/2013  . Vaginal candidiasis 03/10/2013  . Nausea and vomiting 03/07/2013  . Hypokalemia 03/07/2013  . Acute pancreatitis 09/05/2012  . Nausea & vomiting 09/02/2012  .  Abdominal pain 09/02/2012  . Narcotic abuse 07/04/2012  . DIABETES MELLITUS, TYPE II 05/05/2007  . DIABETIC PERIPHERAL NEUROPATHY 05/05/2007  . Microcytic anemia 05/05/2007  . ANXIETY 05/05/2007  . TOBACCO ABUSE 05/05/2007  . GERD 05/05/2007  . Backache 05/05/2007    Past Surgical History  Procedure Laterality Date  . Cholecystectomy    . Esophagogastroduodenoscopy N/A 09/07/2012    Procedure: ESOPHAGOGASTRODUODENOSCOPY (EGD);  Surgeon: Juanita Craver, MD;  Location: [facility] ENDOSCOPY;  Service: Endoscopy;  Laterality: N/A;  . Cesarean section      Current Outpatient Rx  Name  Route  Sig  Dispense  Refill  . alprazolam (XANAX) 2 MG tablet   Oral   Take 2 mg by mouth 3 (three) times daily as needed for sleep or anxiety.         Marland Kitchen amphetamine-dextroamphetamine (ADDERALL) 30 MG tablet   Oral   Take 30 mg by mouth 2 (two) times daily.          . cloNIDine (CATAPRES) 0.2 MG tablet   Oral   Take 1 tablet (0.2 mg total) by mouth 2 (two) times daily.   10 tablet   0   . dicyclomine (BENTYL) 20 MG tablet   Oral   Take 1 tablet (20 mg total) by mouth 2 (two) times daily. Patient not taking: Reported on 06/15/2014   20 tablet   0   . insulin aspart (NOVOLOG) 100 UNIT/ML injection   Subcutaneous   Inject 10-20 Units into the  skin 3 (three) times daily before meals. Sliding scale   1 vial   2   . Insulin Glargine (LANTUS SOLOSTAR) 100 UNIT/ML SOPN   Subcutaneous   Inject 80 Units into the skin at bedtime.   5 pen   2   . oxyCODONE-acetaminophen (PERCOCET) 10-325 MG per tablet   Oral   Take 1 tablet by mouth every 6 (six) hours as needed for pain.   120 tablet   0   . promethazine (PHENERGAN) 25 MG suppository   Rectal   Place 1 suppository (25 mg total) rectally every 6 (six) hours as needed for nausea or vomiting. Patient not taking: Reported on 06/15/2014   12 suppository   1   . promethazine (PHENERGAN) 25 MG tablet   Oral   Take 1 tablet (25 mg total) by mouth  every 6 (six) hours as needed for nausea or vomiting. Patient not taking: Reported on 06/15/2014   12 tablet   0   . promethazine (PHENERGAN) 25 MG tablet   Oral   Take 1 tablet (25 mg total) by mouth every 6 (six) hours as needed for nausea or vomiting. Patient not taking: Reported on 06/15/2014   12 tablet   0   . promethazine (PHENERGAN) 25 MG tablet   Oral   Take 1 tablet (25 mg total) by mouth every 6 (six) hours as needed for nausea or vomiting.   12 tablet   0     Allergies Reglan; Bentyl; Compazine; Ketorolac; Morphine and related; and Zofran  Family History  Problem Relation Age of Onset  . Hypertension Mother   . Diabetes Mother   . Hypertension Maternal Aunt   . Diabetes Maternal Aunt   . Hypertension Maternal Uncle   . Diabetes Maternal Uncle   . Hypertension Maternal Grandmother     Social History History  Substance Use Topics  . Smoking status: Current Every Day Smoker -- 0.50 packs/day for 25 years    Types: Cigarettes  . Smokeless tobacco: Never Used  . Alcohol Use: No    Review of Systems Constitutional: Negative for fever. Eyes: Negative for visual changes. ENT: Negative for sore throat. Cardiovascular: Negative for chest pain. Respiratory: Negative for shortness of breath. Gastrointestinal: Positive for abdominal pain or vomiting Genitourinary: Negative for dysuria. Musculoskeletal: Negative for back pain. Positive left foot pain Skin: Negative for rash. Neurological: Positive for weakness numbness.  10-point ROS otherwise negative.  ____________________________________________   PHYSICAL EXAM:  VITAL SIGNS: ED Triage Vitals  Enc Vitals Group     BP 11/18/14 1850 122/73 mmHg     Pulse Rate 11/18/14 1850 117     Resp 11/18/14 1850 20     Temp 11/18/14 1850 98.4 F (36.9 C)     Temp Source 11/18/14 1850 Oral     SpO2 11/18/14 1850 100 %     Weight 11/18/14 1850 190 lb (86.183 kg)     Height 11/18/14 1850 5\' 6"  (1.676 m)      Head Cir --      Peak Flow --      Pain Score 11/18/14 1851 10     Pain Loc --      Pain Edu? --      Excl. in [place]? --     Constitutional: Alert and oriented. Well appearing and in no distress. Eyes: Conjunctivae are normal. PERRL. Normal extraocular movements. ENT   Head: Normocephalic and atraumatic.   Nose: No congestion/rhinnorhea.   Mouth/Throat: Mucous membranes  are moist.   Neck: No stridor. Hematological/Lymphatic/Immunilogical: No cervical lymphadenopathy. Cardiovascular: Normal rate, regular rhythm. Normal and symmetric distal pulses are present in all extremities. No murmurs, rubs, or gallops. Respiratory: Normal respiratory effort without tachypnea nor retractions. Breath sounds are clear and equal bilaterally. No wheezes/rales/rhonchi. Gastrointestinal: Soft and nontender. No distention. No abdominal bruits. There is no CVA tenderness. Musculoskeletal: Tender left foot, normal range of motion. Neurologic:  Normal speech and language. No gross focal neurologic deficits are appreciated. Speech is normal. No gait instability. Skin:  Skin is warm, dry and intact. No rash noted. Psychiatric: Mood and affect are normal. Speech and behavior are normal. Patient exhibits appropriate insight and judgment. Patient does admit to having narcotic addiction and chronic pain  ____________________________________________    LABS (pertinent positives/negatives)  Labs Reviewed  CBC WITH DIFFERENTIAL/PLATELET  COMPREHENSIVE METABOLIC PANEL  LIPASE, BLOOD  URINALYSIS COMPLETEWITH MICROSCOPIC ([place])  RETICULOCYTES   ____________________________________________  ED COURSE:  Pertinent labs & imaging results that were available during my care of the patient were reviewed by me and considered in my medical decision making (see chart for details).  We'll offer patient outpatient detox for narcotic addiction which the patient admits that she  has. ____________________________________________   RADIOLOGY  None  ____________________________________________    FINAL ASSESSMENT AND PLAN  Narcotic addiction and drug-seeking behavior  Plan: Patient given outpatient referral to detox centers. We'll give antiemetics as needed.    Earleen Newport, MD   Earleen Newport, MD 11/18/14 [phone]

## 2014-11-18 NOTE — ED Notes (Signed)
Patient to ED from [facility] with report of vomiting and abdominal pain.

## 2014-12-15 ENCOUNTER — Encounter (HOSPITAL_COMMUNITY): Payer: Self-pay | Admitting: Emergency Medicine

## 2014-12-15 ENCOUNTER — Emergency Department (HOSPITAL_COMMUNITY)
Admission: EM | Admit: 2014-12-15 | Discharge: 2014-12-15 | Disposition: A | Discharge status: Home / Self Care | Payer: Medicare Other | Attending: Emergency Medicine | Admitting: Emergency Medicine

## 2014-12-15 DIAGNOSIS — Z87440 Personal history of urinary (tract) infections: Secondary | ICD-10-CM | POA: Diagnosis not present

## 2014-12-15 DIAGNOSIS — E119 Type 2 diabetes mellitus without complications: Secondary | ICD-10-CM | POA: Diagnosis not present

## 2014-12-15 DIAGNOSIS — M791 Myalgia: Secondary | ICD-10-CM | POA: Insufficient documentation

## 2014-12-15 DIAGNOSIS — Z87442 Personal history of urinary calculi: Secondary | ICD-10-CM | POA: Diagnosis not present

## 2014-12-15 DIAGNOSIS — Z85810 Personal history of malignant neoplasm of tongue: Secondary | ICD-10-CM | POA: Diagnosis not present

## 2014-12-15 DIAGNOSIS — F419 Anxiety disorder, unspecified: Secondary | ICD-10-CM | POA: Diagnosis not present

## 2014-12-15 DIAGNOSIS — R112 Nausea with vomiting, unspecified: Secondary | ICD-10-CM | POA: Diagnosis present

## 2014-12-15 DIAGNOSIS — Z794 Long term (current) use of insulin: Secondary | ICD-10-CM | POA: Insufficient documentation

## 2014-12-15 DIAGNOSIS — F1193 Opioid use, unspecified with withdrawal: Secondary | ICD-10-CM

## 2014-12-15 DIAGNOSIS — F1123 Opioid dependence with withdrawal: Secondary | ICD-10-CM | POA: Diagnosis not present

## 2014-12-15 DIAGNOSIS — R109 Unspecified abdominal pain: Secondary | ICD-10-CM | POA: Insufficient documentation

## 2014-12-15 DIAGNOSIS — Z720 Tobacco use: Secondary | ICD-10-CM | POA: Insufficient documentation

## 2014-12-15 DIAGNOSIS — Z8739 Personal history of other diseases of the musculoskeletal system and connective tissue: Secondary | ICD-10-CM | POA: Insufficient documentation

## 2014-12-15 DIAGNOSIS — Z3202 Encounter for pregnancy test, result negative: Secondary | ICD-10-CM | POA: Insufficient documentation

## 2014-12-15 DIAGNOSIS — G8929 Other chronic pain: Secondary | ICD-10-CM | POA: Insufficient documentation

## 2014-12-15 DIAGNOSIS — Z862 Personal history of diseases of the blood and blood-forming organs and certain disorders involving the immune mechanism: Secondary | ICD-10-CM | POA: Diagnosis not present

## 2014-12-15 DIAGNOSIS — F319 Bipolar disorder, unspecified: Secondary | ICD-10-CM | POA: Diagnosis not present

## 2014-12-15 DIAGNOSIS — M545 Low back pain: Secondary | ICD-10-CM | POA: Diagnosis not present

## 2014-12-15 LAB — COMPREHENSIVE METABOLIC PANEL
ALK PHOS: 79 U/L (ref 38–126)
ALT: 11 U/L — ABNORMAL LOW (ref 14–54)
AST: 22 U/L (ref 15–41)
Albumin: 4.7 g/dL (ref 3.5–5.0)
Anion gap: 11 (ref 5–15)
BILIRUBIN TOTAL: 0.7 mg/dL (ref 0.3–1.2)
BUN: 8 mg/dL (ref 6–20)
CHLORIDE: 98 mmol/L — AB (ref 101–111)
CO2: 26 mmol/L (ref 22–32)
Calcium: 9.6 mg/dL (ref 8.9–10.3)
Creatinine, Ser: 0.74 mg/dL (ref 0.44–1.00)
Glucose, Bld: 366 mg/dL — ABNORMAL HIGH (ref 65–99)
Potassium: 3.7 mmol/L (ref 3.5–5.1)
Sodium: 135 mmol/L (ref 135–145)
Total Protein: 9.2 g/dL — ABNORMAL HIGH (ref 6.5–8.1)

## 2014-12-15 LAB — CBC WITH DIFFERENTIAL/PLATELET
BASOS ABS: 0 10*3/uL (ref 0.0–0.1)
BASOS PCT: 0 % (ref 0–1)
Eosinophils Absolute: 0.1 10*3/uL (ref 0.0–0.7)
Eosinophils Relative: 1 % (ref 0–5)
HEMATOCRIT: 38.3 % (ref 36.0–46.0)
HEMOGLOBIN: 11.6 g/dL — AB (ref 12.0–15.0)
LYMPHS ABS: 1.2 10*3/uL (ref 0.7–4.0)
Lymphocytes Relative: 9 % — ABNORMAL LOW (ref 12–46)
MCH: 20 pg — ABNORMAL LOW (ref 26.0–34.0)
MCHC: 30.3 g/dL (ref 30.0–36.0)
MCV: 66.1 fL — AB (ref 78.0–100.0)
MONOS PCT: 5 % (ref 3–12)
Monocytes Absolute: 0.7 10*3/uL (ref 0.1–1.0)
NEUTROS ABS: 11.1 10*3/uL — AB (ref 1.7–7.7)
Neutrophils Relative %: 85 % — ABNORMAL HIGH (ref 43–77)
Platelets: 500 10*3/uL — ABNORMAL HIGH (ref 150–400)
RBC: 5.79 MIL/uL — ABNORMAL HIGH (ref 3.87–5.11)
RDW: 29 % — ABNORMAL HIGH (ref 11.5–15.5)
WBC: 13.1 10*3/uL — AB (ref 4.0–10.5)

## 2014-12-15 LAB — URINALYSIS, ROUTINE W REFLEX MICROSCOPIC
Bilirubin Urine: NEGATIVE
Ketones, ur: 15 mg/dL — AB
Leukocytes, UA: NEGATIVE
Nitrite: NEGATIVE
Protein, ur: 100 mg/dL — AB
SPECIFIC GRAVITY, URINE: 1.045 — AB (ref 1.005–1.030)
Urobilinogen, UA: 0.2 mg/dL (ref 0.0–1.0)
pH: 6.5 (ref 5.0–8.0)

## 2014-12-15 LAB — URINE MICROSCOPIC-ADD ON

## 2014-12-15 LAB — LIPASE, BLOOD: Lipase: 20 U/L — ABNORMAL LOW (ref 22–51)

## 2014-12-15 LAB — CBG MONITORING, ED
GLUCOSE-CAPILLARY: 348 mg/dL — AB (ref 65–99)
Glucose-Capillary: 345 mg/dL — ABNORMAL HIGH (ref 65–99)

## 2014-12-15 LAB — PREGNANCY, URINE: PREG TEST UR: NEGATIVE

## 2014-12-15 MED ORDER — PROMETHAZINE HCL 25 MG PO TABS
25.0000 mg | ORAL_TABLET | Freq: Once | ORAL | Status: AC
  Administered 2014-12-15: 25 mg via ORAL
  Filled 2014-12-15: qty 1

## 2014-12-15 MED ORDER — INSULIN ASPART 100 UNIT/ML ~~LOC~~ SOLN
10.0000 [IU] | Freq: Once | SUBCUTANEOUS | Status: AC
  Administered 2014-12-15: 10 [IU] via SUBCUTANEOUS
  Filled 2014-12-15: qty 1

## 2014-12-15 MED ORDER — PROMETHAZINE HCL 25 MG RE SUPP: 25.0000 mg | Freq: Four times a day (QID) | RECTAL | Status: DC | PRN

## 2014-12-15 MED ORDER — PROMETHAZINE HCL 25 MG RE SUPP
25.0000 mg | Freq: Once | RECTAL | Status: AC
  Administered 2014-12-15: 25 mg via RECTAL
  Filled 2014-12-15: qty 1

## 2014-12-15 MED ORDER — CLONIDINE HCL 0.1 MG PO TABS
0.2000 mg | ORAL_TABLET | Freq: Once | ORAL | Status: AC
  Administered 2014-12-15: 0.2 mg via ORAL
  Filled 2014-12-15: qty 2

## 2014-12-15 NOTE — ED Provider Notes (Signed)
CSN: [id number]     Arrival date & time 12/15/14  0092 History   First MD Initiated Contact with Patient 12/15/14 [phone]     Chief Complaint  Patient presents with  . Emesis     (Consider location/radiation/quality/duration/timing/severity/associated sxs/prior Treatment) HPI History with history of chronic abdominal pain, narcotic addiction presents with diffuse abdominal cramping and vomiting. Incarcerated yesterday and started complaining of symptoms 2 hours after incarceration. She's had multiple episodes of vomiting since onset. She denies any diarrhea. She last took Percocet 2 days ago. She denies any fever or chills. No urinary or vaginal symptoms. Also complains of chronic ongoing low back pain. This is unchanged. No radiation. No incontinence. No focal weakness or numbness. Past Medical History  Diagnosis Date  . Tongue cancer   . Diabetes mellitus without complication   . Diabetes mellitus   . Headache(784.0)   . Seasonal allergies   . Abnormal Pap smear   . Gout   . Depression   . Anxiety   . UTI (urinary tract infection)   . Bipolar 1 disorder   . Kidney calculi   . Anemia   . Purple toe syndrome     patient has a slightly swollen purplish discolored right fourth toe   Past Surgical History  Procedure Laterality Date  . Cholecystectomy    . Esophagogastroduodenoscopy N/A 09/07/2012    Procedure: ESOPHAGOGASTRODUODENOSCOPY (EGD);  Surgeon: Juanita Craver, MD;  Location: [facility] ENDOSCOPY;  Service: Endoscopy;  Laterality: N/A;  . Cesarean section     Family History  Problem Relation Age of Onset  . Hypertension Mother   . Diabetes Mother   . Hypertension Maternal Aunt   . Diabetes Maternal Aunt   . Hypertension Maternal Uncle   . Diabetes Maternal Uncle   . Hypertension Maternal Grandmother    History  Substance Use Topics  . Smoking status: Current Every Day Smoker -- 0.50 packs/day for 25 years    Types: Cigarettes  . Smokeless tobacco: Never Used  . Alcohol Use:  No   OB History    Gravida Para Term Preterm AB TAB SAB Ectopic Multiple Living   8 8 7 1  0  0   7     Review of Systems  Constitutional: Negative for fever and chills.  Respiratory: Negative for shortness of breath.   Cardiovascular: Negative for chest pain.  Gastrointestinal: Positive for nausea, vomiting and abdominal pain. Negative for diarrhea and blood in stool.  Genitourinary: Negative for dysuria, frequency, flank pain, difficulty urinating and pelvic pain.  Musculoskeletal: Positive for myalgias and back pain. Negative for neck pain and neck stiffness.  Skin: Negative for rash and wound.  Neurological: Negative for dizziness, weakness, light-headedness, numbness and headaches.  All other systems reviewed and are negative.     Allergies  Reglan; Bentyl; Compazine; Ketorolac; Morphine and related; and Zofran  Home Medications   Prior to Admission medications   Medication Sig Start Date End Date Taking? Authorizing Provider  Acetaminophen (TYLENOL 8 HOUR PO) Take 1 tablet by mouth once.   Yes Historical Provider, MD  alprazolam Duanne Moron) 2 MG tablet Take 2 mg by mouth 3 (three) times daily as needed for sleep or anxiety.   Yes Historical Provider, MD  amphetamine-dextroamphetamine (ADDERALL) 30 MG tablet Take 30 mg by mouth 3 (three) times daily.    Yes Historical Provider, MD  insulin aspart (NOVOLOG) 100 UNIT/ML injection Inject 10-20 Units into the skin 3 (three) times daily before meals. Sliding scale Patient taking differently:  Inject 10-40 Units into the skin 3 (three) times daily before meals. Sliding scale 05/07/13  Yes Shanker Kristeen Mans, MD  Insulin Glargine (LANTUS SOLOSTAR) 100 UNIT/ML SOPN Inject 80 Units into the skin at bedtime. 05/07/13  Yes Shanker Kristeen Mans, MD  meclizine (ANTIVERT) 25 MG tablet Take 25 mg by mouth once.   Yes Historical Provider, MD  oxyCODONE-acetaminophen (PERCOCET) 10-325 MG per tablet Take 1 tablet by mouth every 6 (six) hours as needed  for pain. 03/12/13  Yes Robbie Lis, MD  cloNIDine (CATAPRES) 0.2 MG tablet Take 1 tablet (0.2 mg total) by mouth 2 (two) times daily. Patient not taking: Reported on 12/15/2014 06/15/14   Orpah Greek, MD  dicyclomine (BENTYL) 20 MG tablet Take 1 tablet (20 mg total) by mouth 2 (two) times daily. Patient not taking: Reported on 06/15/2014 06/15/14   Britt Bottom, NP  promethazine (PHENERGAN) 25 MG suppository Place 1 suppository (25 mg total) rectally every 6 (six) hours as needed for nausea or vomiting. 12/15/14   Julianne Rice, MD   BP 164/72 mmHg  Pulse 93  Temp(Src) 98 F (36.7 C) (Oral)  Resp 18  Ht 5\' 7"  (1.702 m)  Wt 190 lb (86.183 kg)  BMI 29.75 kg/m2  SpO2 98%  LMP 11/11/2014 Physical Exam  Constitutional: She is oriented to person, place, and time. She appears well-developed and well-nourished. No distress.  Patient is well-appearing. Accompanied by guards. Pt is in wrist and ankle restraints  HENT:  Head: Normocephalic and atraumatic.  Mouth/Throat: Oropharynx is clear and moist.  Eyes: EOM are normal. Pupils are equal, round, and reactive to light.  Neck: Normal range of motion. Neck supple.  Cardiovascular: Normal rate and regular rhythm.   Pulmonary/Chest: Effort normal and breath sounds normal. No respiratory distress. She has no wheezes. She has no rales. She exhibits no tenderness.  Abdominal: Soft. Bowel sounds are normal. She exhibits no distension and no mass. There is no tenderness. There is no rebound and no guarding.  Musculoskeletal: Normal range of motion. She exhibits tenderness. She exhibits no edema.  Patient has mild bilateral paraspinal lumbar tenderness. No midline tenderness. No CVA tenderness bilaterally.  Neurological: She is alert and oriented to person, place, and time.  Moves all extremities without deficit. Sensation is grossly intact.  Skin: Skin is warm and dry. No rash noted. No erythema.  Psychiatric: She has a normal mood  and affect. Her behavior is normal.  Nursing note and vitals reviewed.   ED Course  Procedures (including critical care time) Labs Review Labs Reviewed  CBC WITH DIFFERENTIAL/PLATELET - Abnormal; Notable for the following:    WBC 13.1 (*)    RBC 5.79 (*)    Hemoglobin 11.6 (*)    MCV 66.1 (*)    MCH 20.0 (*)    RDW 29.0 (*)    Platelets 500 (*)    Neutrophils Relative % 85 (*)    Lymphocytes Relative 9 (*)    Neutro Abs 11.1 (*)    All other components within normal limits  COMPREHENSIVE METABOLIC PANEL - Abnormal; Notable for the following:    Chloride 98 (*)    Glucose, Bld 366 (*)    Total Protein 9.2 (*)    ALT 11 (*)    All other components within normal limits  LIPASE, BLOOD - Abnormal; Notable for the following:    Lipase 20 (*)    All other components within normal limits  URINALYSIS, ROUTINE W REFLEX MICROSCOPIC (NOT AT  [facility]) - Abnormal; Notable for the following:    Specific Gravity, Urine 1.045 (*)    Glucose, UA >1000 (*)    Hgb urine dipstick TRACE (*)    Ketones, ur 15 (*)    Protein, ur 100 (*)    All other components within normal limits  CBG MONITORING, ED - Abnormal; Notable for the following:    Glucose-Capillary 345 (*)    All other components within normal limits  CBG MONITORING, ED - Abnormal; Notable for the following:    Glucose-Capillary 348 (*)    All other components within normal limits  PREGNANCY, URINE  URINE MICROSCOPIC-ADD ON    Imaging Review No results found.   EKG Interpretation None      MDM   Final diagnoses:  Narcotic withdrawal    Patient's symptoms likely due to narcotic withdrawal. She has multiple allergies to most antiemetics. She is not actively vomiting. She is in no distress. We'll treat with clonidine and check basic electrolytes. Anticipate [facility] back to [facility].   Abdominal exam is benign.  We'll discharge home with PR Phenergan for nausea. Return precautions given.   Julianne Rice, MD 12/16/14  [phone]

## 2014-12-15 NOTE — ED Notes (Signed)
Pt had 2 emesis episodes since taking PO medications per Designer, industrial/product. MD aware. Pt given phenergan 25mg  pr.

## 2014-12-15 NOTE — ED Notes (Signed)
Per night shift nurse pt given ice water and encouraged to take sips; pt "chugged" water resulting in active vomiting.

## 2014-12-15 NOTE — ED Notes (Signed)
Pt transported by staff from [facility] with reports of abd pain, n/v onset 1700 yesterday. 2 hours after being incarcerated.  Nurse gave Tylenol and Meclizine at midnight and SS insulin 10 Units for BS of 470 Per [facility] note 11/20/14, pt has been ruled out for SS.

## 2014-12-15 NOTE — ED Notes (Signed)
Pt ambulated to room 25 with 2 correctional officers.

## 2014-12-15 NOTE — ED Notes (Signed)
Pt request soft drink. Informed MD prefers her to drink sips of water. When given water, she proceeds to take big gulps instead.

## 2014-12-15 NOTE — Discharge Instructions (Signed)
Opioid Withdrawal  Opioids are a group of narcotic drugs. They include the street drug heroin. They also include pain medicines, such as morphine, hydrocodone, oxycodone, and fentanyl. Opioid withdrawal is a group of characteristic physical and mental signs and symptoms. It typically occurs if you have been using opioids daily for several weeks or longer and stop using or rapidly decrease use. Opioid withdrawal can also occur if you have used opioids daily for a long time and are given a medicine to block the effect.   SIGNS AND SYMPTOMS  Opioid withdrawal includes three or more of the following symptoms:   · Depressed, anxious, or irritable mood.  · Nausea or vomiting.  · Muscle aches or spasms.    · Watery eyes.     · Runny nose.  · Dilated pupils, sweating, or hairs standing on end.  · Diarrhea or intestinal cramping.  · Yawning.    · Fever.  · Increased blood pressure.  · Fast pulse.  · Restlessness or trouble sleeping.  These signs and symptoms occur within several hours of stopping or reducing short-acting opioids, such as heroin. They can occur within 3 days of stopping or reducing long-acting opioids, such as methadone. Withdrawal begins within minutes of receiving a drug that blocks the effects of opioids, such as naltrexone or naloxone.  DIAGNOSIS   Opioid use disorder is diagnosed by your health care provider. You will be asked about your symptoms, drug and alcohol use, medical history, and use of medicines. A physical exam may be done. Lab tests may be ordered. Your health care provider may have you see a mental health professional.   TREATMENT   The treatment for opioid withdrawal is usually provided by medical doctors with special training in substance use disorders (addiction specialists). The following medicines may be included in treatment:  · Opioids given in place of the abused opioid. They turn on opioid receptors in the brain and lessen or prevent withdrawal symptoms. They are gradually  decreased (opioid substitution and taper).  · Non-opioids that can lessen certain opioid withdrawal symptoms. They may be used alone or with opioid substitution and taper.  Successful long-term recovery usually requires medicine, counseling, and group support.  HOME CARE INSTRUCTIONS   · Take medicines only as directed by your health care provider.  · Check with your health care provider before starting new medicines.  · Keep all follow-up visits as directed by your health care provider.  SEEK MEDICAL CARE IF:  · You are not able to take your medicines as directed.  · Your symptoms get worse.  · You relapse.  SEEK IMMEDIATE MEDICAL CARE IF:  · You have serious thoughts about hurting yourself or others.  · You have a seizure.  · You lose consciousness.  Document Released: 06/14/2003 Document Revised: 10/26/2013 Document Reviewed: 06/24/2013  [employer]® Patient Information ©2015 [employer]. This information is not intended to replace advice given to you by your health care provider. Make sure you discuss any questions you have with your health care provider.

## 2015-06-05 ENCOUNTER — Encounter (HOSPITAL_COMMUNITY): Payer: Self-pay

## 2015-06-05 ENCOUNTER — Emergency Department (HOSPITAL_COMMUNITY)
Admission: EM | Admit: 2015-06-05 | Discharge: 2015-06-05 | Disposition: A | Discharge status: Home / Self Care | Payer: Medicare Other | Attending: Emergency Medicine | Admitting: Emergency Medicine

## 2015-06-05 DIAGNOSIS — Z87442 Personal history of urinary calculi: Secondary | ICD-10-CM | POA: Diagnosis not present

## 2015-06-05 DIAGNOSIS — Z794 Long term (current) use of insulin: Secondary | ICD-10-CM | POA: Insufficient documentation

## 2015-06-05 DIAGNOSIS — R112 Nausea with vomiting, unspecified: Secondary | ICD-10-CM | POA: Insufficient documentation

## 2015-06-05 DIAGNOSIS — M25572 Pain in left ankle and joints of left foot: Secondary | ICD-10-CM | POA: Diagnosis not present

## 2015-06-05 DIAGNOSIS — F17210 Nicotine dependence, cigarettes, uncomplicated: Secondary | ICD-10-CM | POA: Insufficient documentation

## 2015-06-05 DIAGNOSIS — Z79899 Other long term (current) drug therapy: Secondary | ICD-10-CM | POA: Diagnosis not present

## 2015-06-05 DIAGNOSIS — Z862 Personal history of diseases of the blood and blood-forming organs and certain disorders involving the immune mechanism: Secondary | ICD-10-CM | POA: Insufficient documentation

## 2015-06-05 DIAGNOSIS — R1012 Left upper quadrant pain: Secondary | ICD-10-CM | POA: Diagnosis present

## 2015-06-05 DIAGNOSIS — Z87440 Personal history of urinary (tract) infections: Secondary | ICD-10-CM | POA: Diagnosis not present

## 2015-06-05 DIAGNOSIS — Z85810 Personal history of malignant neoplasm of tongue: Secondary | ICD-10-CM | POA: Insufficient documentation

## 2015-06-05 DIAGNOSIS — F419 Anxiety disorder, unspecified: Secondary | ICD-10-CM | POA: Insufficient documentation

## 2015-06-05 DIAGNOSIS — F319 Bipolar disorder, unspecified: Secondary | ICD-10-CM | POA: Diagnosis not present

## 2015-06-05 DIAGNOSIS — R739 Hyperglycemia, unspecified: Secondary | ICD-10-CM

## 2015-06-05 DIAGNOSIS — Z8679 Personal history of other diseases of the circulatory system: Secondary | ICD-10-CM | POA: Insufficient documentation

## 2015-06-05 DIAGNOSIS — Z3202 Encounter for pregnancy test, result negative: Secondary | ICD-10-CM | POA: Diagnosis not present

## 2015-06-05 DIAGNOSIS — Z9049 Acquired absence of other specified parts of digestive tract: Secondary | ICD-10-CM | POA: Insufficient documentation

## 2015-06-05 DIAGNOSIS — E1165 Type 2 diabetes mellitus with hyperglycemia: Secondary | ICD-10-CM | POA: Diagnosis not present

## 2015-06-05 LAB — CBC
HEMATOCRIT: 33.7 % — AB (ref 36.0–46.0)
Hemoglobin: 9.7 g/dL — ABNORMAL LOW (ref 12.0–15.0)
MCH: 16 pg — ABNORMAL LOW (ref 26.0–34.0)
MCHC: 28.8 g/dL — ABNORMAL LOW (ref 30.0–36.0)
MCV: 55.5 fL — AB (ref 78.0–100.0)
PLATELETS: 213 10*3/uL (ref 150–400)
RBC: 6.07 MIL/uL — AB (ref 3.87–5.11)
RDW: 21.8 % — AB (ref 11.5–15.5)
WBC: 8.8 10*3/uL (ref 4.0–10.5)

## 2015-06-05 LAB — URINALYSIS, ROUTINE W REFLEX MICROSCOPIC
Bilirubin Urine: NEGATIVE
KETONES UR: 40 mg/dL — AB
LEUKOCYTES UA: NEGATIVE
NITRITE: NEGATIVE
PROTEIN: 100 mg/dL — AB
Specific Gravity, Urine: >1.046 — ABNORMAL HIGH (ref 1.005–1.030)
pH: 6.5 (ref 5.0–8.0)

## 2015-06-05 LAB — COMPREHENSIVE METABOLIC PANEL
ALT: 18 U/L (ref 14–54)
AST: 25 U/L (ref 15–41)
Albumin: 3.5 g/dL (ref 3.5–5.0)
Alkaline Phosphatase: 88 U/L (ref 38–126)
Anion gap: 9 (ref 5–15)
BILIRUBIN TOTAL: 0.5 mg/dL (ref 0.3–1.2)
BUN: 7 mg/dL (ref 6–20)
CHLORIDE: 102 mmol/L (ref 101–111)
CO2: 22 mmol/L (ref 22–32)
CREATININE: 0.66 mg/dL (ref 0.44–1.00)
Calcium: 8.7 mg/dL — ABNORMAL LOW (ref 8.9–10.3)
Glucose, Bld: 382 mg/dL — ABNORMAL HIGH (ref 65–99)
POTASSIUM: 3.1 mmol/L — AB (ref 3.5–5.1)
Sodium: 133 mmol/L — ABNORMAL LOW (ref 135–145)
TOTAL PROTEIN: 7.6 g/dL (ref 6.5–8.1)

## 2015-06-05 LAB — URINE MICROSCOPIC-ADD ON: Bacteria, UA: NONE SEEN

## 2015-06-05 LAB — LIPASE, BLOOD: LIPASE: 45 U/L (ref 11–51)

## 2015-06-05 LAB — POC URINE PREG, ED: Preg Test, Ur: NEGATIVE

## 2015-06-05 MED ORDER — FENTANYL CITRATE (PF) 100 MCG/2ML IJ SOLN: 25.0000 ug | Freq: Once | INTRAMUSCULAR | Status: DC

## 2015-06-05 MED ORDER — PROMETHAZINE HCL 25 MG/ML IJ SOLN
25.0000 mg | Freq: Once | INTRAMUSCULAR | Status: AC
  Administered 2015-06-05: 25 mg via INTRAVENOUS
  Filled 2015-06-05: qty 1

## 2015-06-05 MED ORDER — SODIUM CHLORIDE 0.9 % IV BOLUS (SEPSIS)
1000.0000 mL | Freq: Once | INTRAVENOUS | Status: AC
  Administered 2015-06-05: 1000 mL via INTRAVENOUS

## 2015-06-05 NOTE — ED Provider Notes (Signed)
CSN: [id number]     Arrival date & time 06/05/15  1826 History   First MD Initiated Contact with Patient 06/05/15 1858     Chief Complaint  Patient presents with  . Abdominal Pain  . Leg Injury     (Consider location/radiation/quality/duration/timing/severity/associated sxs/prior Treatment) HPI  [name] is a 39 y.o. female with a PMH of bipolar disorder, opioid abuse, diabetes, who presents to the emergency department with left upper abdominal pain. Reports a 1 day history of LUQ abdominal pain, dull, constant, non-radiating. Eating worsens the pain, nothing improves the pain. Associated with nausea, vomiting, increased urinary urgency and frequency. Patient reports taking two percocet that she had at home for the pain prior to arrival and they only helped the pain a small amount. Denies fever, chills, chest pain, shortness of breath, back pain, hematuria. Reports history of kidney stones but states this does not feel similar. States she has been taking her insulin as prescribed. Last BM today.   Past Medical History  Diagnosis Date  . Tongue cancer ([place])   . Diabetes mellitus without complication ([place])   . Diabetes mellitus   . Headache(784.0)   . Seasonal allergies   . Abnormal Pap smear   . Gout   . Depression   . Anxiety   . UTI (urinary tract infection)   . Bipolar 1 disorder ([place])   . Kidney calculi   . Anemia   . 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 Cholecystectomy    . Esophagogastroduodenoscopy N/A 09/07/2012    Procedure: ESOPHAGOGASTRODUODENOSCOPY (EGD);  Surgeon: Juanita Craver, MD;  Location: [facility] ENDOSCOPY;  Service: Endoscopy;  Laterality: N/A;  . Cesarean section     Family History  Problem Relation Age of Onset  . Hypertension Mother   . Diabetes Mother   . Hypertension Maternal Aunt   . Diabetes Maternal Aunt   . Hypertension Maternal  Uncle   . Diabetes Maternal Uncle   . Hypertension Maternal Grandmother    Social History  Substance Use Topics  . Smoking status: Current Every Day Smoker -- 0.50 packs/day for 25 years    Types: Cigarettes  . Smokeless tobacco: Never Used  . Alcohol Use: No   OB History    Gravida Para Term Preterm AB TAB SAB Ectopic Multiple Living   8 8 7 1  0  0   7     Review of Systems  Constitutional: Negative for fever and appetite change.  HENT: Negative for congestion.   Respiratory: Negative for cough, chest tightness and shortness of breath.   Cardiovascular: Negative for chest pain.  Gastrointestinal: Positive for nausea, vomiting and abdominal pain. Negative for diarrhea and blood in stool.  Genitourinary: Positive for urgency and frequency. Negative for dysuria, hematuria and flank pain.  Musculoskeletal: Negative for back pain.  Skin: Negative for rash.  Neurological: Negative for dizziness, seizures, weakness and light-headedness.  Psychiatric/Behavioral: Negative for behavioral problems.      Allergies  Reglan; Bentyl; Compazine; Ketorolac; Morphine and related; and Zofran  Home Medications   Prior to Admission medications   Medication Sig Start Date End Date Taking? Authorizing Provider  Acetaminophen (TYLENOL 8 HOUR PO) Take 1 tablet by mouth once.    Historical Provider, MD  alprazolam Duanne Moron) 2 MG tablet Take 2 mg by mouth 3 (three) times daily as needed for sleep or anxiety.  Historical Provider, MD  amphetamine-dextroamphetamine (ADDERALL) 30 MG tablet Take 30 mg by mouth 3 (three) times daily.     Historical Provider, MD  cloNIDine (CATAPRES) 0.2 MG tablet Take 1 tablet (0.2 mg total) by mouth 2 (two) times daily. Patient not taking: Reported on 12/15/2014 06/15/14   Orpah Greek, MD  dicyclomine (BENTYL) 20 MG tablet Take 1 tablet (20 mg total) by mouth 2 (two) times daily. Patient not taking: Reported on 06/15/2014 06/15/14   Britt Bottom, NP   insulin aspart (NOVOLOG) 100 UNIT/ML injection Inject 10-20 Units into the skin 3 (three) times daily before meals. Sliding scale Patient taking differently: Inject 10-40 Units into the skin 3 (three) times daily before meals. Sliding scale 05/07/13   Jonetta Osgood, MD  Insulin Glargine (LANTUS SOLOSTAR) 100 UNIT/ML SOPN Inject 80 Units into the skin at bedtime. 05/07/13   Shanker Kristeen Mans, MD  meclizine (ANTIVERT) 25 MG tablet Take 25 mg by mouth once.    Historical Provider, MD  oxyCODONE-acetaminophen (PERCOCET) 10-325 MG per tablet Take 1 tablet by mouth every 6 (six) hours as needed for pain. 03/12/13   Robbie Lis, MD  promethazine (PHENERGAN) 25 MG suppository Place 1 suppository (25 mg total) rectally every 6 (six) hours as needed for nausea or vomiting. 12/15/14   Julianne Rice, MD   BP 138/79 mmHg  Pulse 102  Temp(Src) 98.4 F (36.9 C) (Oral)  Resp 28  Ht 5\' 7"  (1.702 m)  Wt 90.719 kg  BMI 31.32 kg/m2  SpO2 97%  LMP 05/28/2015 Physical Exam  Constitutional: She is oriented to person, place, and time. She appears well-developed and well-nourished.  HENT:  Head: Atraumatic.  Mouth/Throat: Oropharynx is clear and moist.  Eyes: Conjunctivae and EOM are normal. Pupils are equal, round, and reactive to light.  Neck: Normal range of motion. No JVD present.  Cardiovascular: Normal rate, regular rhythm, normal heart sounds and intact distal pulses.   Pulmonary/Chest: Effort normal and breath sounds normal. No respiratory distress. She has no wheezes. She exhibits no tenderness.  Abdominal: Soft. Bowel sounds are normal. She exhibits no distension. There is tenderness in the left upper quadrant. There is no rigidity, no rebound, no guarding, no CVA tenderness, no tenderness at McBurney's point and negative Murphy's sign.  Musculoskeletal: Normal range of motion.  House arrest bracelet on the L ankle  Neurological: She is alert and oriented to person, place, and time.  Skin:  Skin is warm. No pallor.  Psychiatric: She has a normal mood and affect.    ED Course  Procedures (including critical care time) Labs Review Labs Reviewed  COMPREHENSIVE METABOLIC PANEL - Abnormal; Notable for the following:    Sodium 133 (*)    Potassium 3.1 (*)    Glucose, Bld 382 (*)    Calcium 8.7 (*)    All other components within normal limits  CBC - Abnormal; Notable for the following:    RBC 6.07 (*)    Hemoglobin 9.7 (*)    HCT 33.7 (*)    MCV 55.5 (*)    MCH 16.0 (*)    MCHC 28.8 (*)    RDW 21.8 (*)    All other components within normal limits  URINALYSIS, ROUTINE W REFLEX MICROSCOPIC (NOT AT [facility]) - Abnormal; Notable for the following:    Specific Gravity, Urine >1.046 (*)    Glucose, UA >1000 (*)    Hgb urine dipstick MODERATE (*)    Ketones, ur 40 (*)  Protein, ur 100 (*)    All other components within normal limits  URINE MICROSCOPIC-ADD ON - Abnormal; Notable for the following:    Squamous Epithelial / LPF 0-5 (*)    All other components within normal limits  LIPASE, BLOOD  POC URINE PREG, ED    Imaging Review No results found. I have personally reviewed and evaluated these images and lab results as part of my medical decision-making.   EKG Interpretation None      MDM   Final diagnoses:  Left upper quadrant pain  Hyperglycemia   [name] is a 39 y.o. female with a PMH of bipolar disorder, opioid abuse, diabetes, who presents to the emergency department with left upper abdominal pain associated with nausea, vomiting, urinary urgency and frequency. Patient on arrival is somnolent, but arousable and responding to questions appropriately. Slurring her speech and falling asleep throughout the discussion. Reports taking 2 percocet prior to arrival, which she states is prescribed to her for chronic pain and sickle cell disease. When confronting the patient that she does not have sickle cell disease, she admits that she does not and that it's for  chronic pain. Per review in the chart, the last percocet prescribed were in 2014 with 0 refills.   Patient in no acute distress. AF. VSS. Exam as above, notable for benign abdominal exam with mild LUQ TTP, no signs of peritonitis. Differential diagnosis includes, pyelonephritis, gastroparesis, pancreatitis, gastritis, PUD, chronic pain. Pregnancy test negative. Lipase 45. Glu elevated at 382 without signs of DKA or hyperosmolar hyperglycemic syndrome. No significant electrolyte abnormalities. No leukocytosis. UA without signs of infectious process.   Patient given IVF and antiemetics. Most likely secondary to gastroparesis and hyperglycemia. Patient's daughter reports that she drinks approximately 1-2 L soda daily or a 24 pack of mountain due. Discussed diabetes at length with the patient and daughter, discussed increasing water intake and decreasing soda intake. Patient stable for discharge home. Discussed strict return precautions for worsening nausea, vomiting, somnolence for return to the emergency department. Discussed follow up with PCP in the next 1-2 days for re-evaluation of hyperglycemia. Patient and daughter expressed understanding. Patient discharged home in stable condition.     Nathaniel Man, MD 06/06/15 [place], MD 06/08/15 [phone]

## 2015-06-05 NOTE — ED Notes (Signed)
Patient' daughter is alert and orientedx4.  Patient's daughter was explained discharge instructions and they understood them with no questions.  [name], the patient's daughter is taking her home.

## 2015-06-05 NOTE — ED Notes (Signed)
Pt. Coming from home c/o abd. Pain and non-healing sore on the back of the left ankle. Pt. Unable to make it to the bathroom to urinate for the past week. Abd. Pain 9/10 started yesterday. Pt. Hx of diabetes. Pt. Security monitor located right above non-healing sore. Pt. Took percocet before coming to ED. Pt. Very drowsy on assessment. Pt. Also endorses nausea, but no vomiting.

## 2015-08-15 ENCOUNTER — Emergency Department (HOSPITAL_COMMUNITY)
Admission: EM | Admit: 2015-08-15 | Discharge: 2015-08-16 | Disposition: A | Discharge status: Other Acute Inpatient Hospital | Payer: Medicare Other | Attending: Emergency Medicine | Admitting: Emergency Medicine

## 2015-08-15 ENCOUNTER — Encounter (HOSPITAL_COMMUNITY): Payer: Self-pay | Admitting: Family Medicine

## 2015-08-15 DIAGNOSIS — Z8679 Personal history of other diseases of the circulatory system: Secondary | ICD-10-CM | POA: Diagnosis not present

## 2015-08-15 DIAGNOSIS — Z85810 Personal history of malignant neoplasm of tongue: Secondary | ICD-10-CM | POA: Insufficient documentation

## 2015-08-15 DIAGNOSIS — R443 Hallucinations, unspecified: Secondary | ICD-10-CM | POA: Diagnosis not present

## 2015-08-15 DIAGNOSIS — F1120 Opioid dependence, uncomplicated: Secondary | ICD-10-CM | POA: Diagnosis not present

## 2015-08-15 DIAGNOSIS — F319 Bipolar disorder, unspecified: Secondary | ICD-10-CM | POA: Insufficient documentation

## 2015-08-15 DIAGNOSIS — F419 Anxiety disorder, unspecified: Secondary | ICD-10-CM | POA: Diagnosis not present

## 2015-08-15 DIAGNOSIS — F17210 Nicotine dependence, cigarettes, uncomplicated: Secondary | ICD-10-CM | POA: Insufficient documentation

## 2015-08-15 DIAGNOSIS — F1910 Other psychoactive substance abuse, uncomplicated: Secondary | ICD-10-CM

## 2015-08-15 DIAGNOSIS — Z87440 Personal history of urinary (tract) infections: Secondary | ICD-10-CM | POA: Diagnosis not present

## 2015-08-15 DIAGNOSIS — Z8739 Personal history of other diseases of the musculoskeletal system and connective tissue: Secondary | ICD-10-CM | POA: Diagnosis not present

## 2015-08-15 DIAGNOSIS — Z043 Encounter for examination and observation following other accident: Secondary | ICD-10-CM | POA: Diagnosis present

## 2015-08-15 DIAGNOSIS — F321 Major depressive disorder, single episode, moderate: Secondary | ICD-10-CM | POA: Diagnosis not present

## 2015-08-15 DIAGNOSIS — Z87442 Personal history of urinary calculi: Secondary | ICD-10-CM | POA: Diagnosis not present

## 2015-08-15 DIAGNOSIS — F1310 Sedative, hypnotic or anxiolytic abuse, uncomplicated: Secondary | ICD-10-CM | POA: Insufficient documentation

## 2015-08-15 DIAGNOSIS — E119 Type 2 diabetes mellitus without complications: Secondary | ICD-10-CM | POA: Diagnosis not present

## 2015-08-15 DIAGNOSIS — Z862 Personal history of diseases of the blood and blood-forming organs and certain disorders involving the immune mechanism: Secondary | ICD-10-CM | POA: Diagnosis not present

## 2015-08-15 DIAGNOSIS — Z794 Long term (current) use of insulin: Secondary | ICD-10-CM | POA: Insufficient documentation

## 2015-08-15 LAB — COMPREHENSIVE METABOLIC PANEL
ALT: 16 U/L (ref 14–54)
AST: 19 U/L (ref 15–41)
Albumin: 4.1 g/dL (ref 3.5–5.0)
Alkaline Phosphatase: 74 U/L (ref 38–126)
Anion gap: 9 (ref 5–15)
BUN: 9 mg/dL (ref 6–20)
CHLORIDE: 100 mmol/L — AB (ref 101–111)
CO2: 24 mmol/L (ref 22–32)
CREATININE: 0.6 mg/dL (ref 0.44–1.00)
Calcium: 9.5 mg/dL (ref 8.9–10.3)
GFR calc Af Amer: >60 mL/min (ref >60)
Glucose, Bld: 463 mg/dL — ABNORMAL HIGH (ref 65–99)
Potassium: 3.9 mmol/L (ref 3.5–5.1)
Sodium: 133 mmol/L — ABNORMAL LOW (ref 135–145)
Total Bilirubin: 0.5 mg/dL (ref 0.3–1.2)
Total Protein: 8.1 g/dL (ref 6.5–8.1)

## 2015-08-15 LAB — ACETAMINOPHEN LEVEL: Acetaminophen (Tylenol), Serum: <10 ug/mL — ABNORMAL LOW (ref 10–30)

## 2015-08-15 LAB — CBC
HEMATOCRIT: 32.3 % — AB (ref 36.0–46.0)
HEMOGLOBIN: 8.8 g/dL — AB (ref 12.0–15.0)
MCH: 15.5 pg — AB (ref 26.0–34.0)
MCHC: 27.2 g/dL — ABNORMAL LOW (ref 30.0–36.0)
MCV: 57.1 fL — AB (ref 78.0–100.0)
Platelets: 428 10*3/uL — ABNORMAL HIGH (ref 150–400)
RBC: 5.66 MIL/uL — AB (ref 3.87–5.11)
RDW: 21.3 % — ABNORMAL HIGH (ref 11.5–15.5)
WBC: 7.3 10*3/uL (ref 4.0–10.5)

## 2015-08-15 LAB — RAPID URINE DRUG SCREEN, HOSP PERFORMED
AMPHETAMINES: NOT DETECTED
Barbiturates: NOT DETECTED
Benzodiazepines: POSITIVE — AB
Cocaine: NOT DETECTED
Opiates: NOT DETECTED
TETRAHYDROCANNABINOL: NOT DETECTED

## 2015-08-15 LAB — ETHANOL

## 2015-08-15 LAB — SALICYLATE LEVEL: Salicylate Lvl: <4 mg/dL (ref 2.8–30.0)
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
## 2015-08-16 ENCOUNTER — Inpatient Hospital Stay (HOSPITAL_COMMUNITY)
Admission: AD | Admit: 2015-08-16 | Discharge: 2015-08-18 | DRG: 885 | Disposition: A | Discharge status: Home / Self Care | Payer: Medicare Other | Attending: Psychiatry | Admitting: Psychiatry

## 2015-08-16 ENCOUNTER — Encounter (HOSPITAL_COMMUNITY): Payer: Self-pay

## 2015-08-16 ENCOUNTER — Encounter (HOSPITAL_COMMUNITY): Payer: Self-pay | Admitting: Emergency Medicine

## 2015-08-16 DIAGNOSIS — F329 Major depressive disorder, single episode, unspecified: Secondary | ICD-10-CM | POA: Diagnosis present

## 2015-08-16 DIAGNOSIS — F1114 Opioid abuse with opioid-induced mood disorder: Secondary | ICD-10-CM | POA: Diagnosis present

## 2015-08-16 DIAGNOSIS — F1310 Sedative, hypnotic or anxiolytic abuse, uncomplicated: Secondary | ICD-10-CM | POA: Diagnosis present

## 2015-08-16 DIAGNOSIS — F1120 Opioid dependence, uncomplicated: Secondary | ICD-10-CM | POA: Diagnosis not present

## 2015-08-16 DIAGNOSIS — E119 Type 2 diabetes mellitus without complications: Secondary | ICD-10-CM | POA: Diagnosis present

## 2015-08-16 DIAGNOSIS — F17210 Nicotine dependence, cigarettes, uncomplicated: Secondary | ICD-10-CM | POA: Diagnosis present

## 2015-08-16 DIAGNOSIS — F1010 Alcohol abuse, uncomplicated: Secondary | ICD-10-CM | POA: Diagnosis present

## 2015-08-16 DIAGNOSIS — F321 Major depressive disorder, single episode, moderate: Secondary | ICD-10-CM | POA: Diagnosis not present

## 2015-08-16 DIAGNOSIS — F331 Major depressive disorder, recurrent, moderate: Principal | ICD-10-CM | POA: Diagnosis present

## 2015-08-16 DIAGNOSIS — Z85810 Personal history of malignant neoplasm of tongue: Secondary | ICD-10-CM

## 2015-08-16 DIAGNOSIS — G4700 Insomnia, unspecified: Secondary | ICD-10-CM | POA: Diagnosis present

## 2015-08-16 LAB — CBG MONITORING, ED
Glucose-Capillary: 382 mg/dL — ABNORMAL HIGH (ref 65–99)
Glucose-Capillary: 307 mg/dL — ABNORMAL HIGH (ref 65–99)
Glucose-Capillary: 349 mg/dL — ABNORMAL HIGH (ref 65–99)

## 2015-08-16 LAB — GLUCOSE, CAPILLARY
Glucose-Capillary: 403 mg/dL — ABNORMAL HIGH (ref 65–99)
Glucose-Capillary: 350 mg/dL — ABNORMAL HIGH (ref 65–99)

## 2015-08-16 MED ORDER — VITAMIN B-1 100 MG PO TABS
100.0000 mg | ORAL_TABLET | Freq: Every day | ORAL | Status: DC
  Administered 2015-08-17 – 2015-08-18 (×2): 100 mg via ORAL
  Filled 2015-08-16 (×5): qty 1

## 2015-08-16 MED ORDER — INSULIN ASPART 100 UNIT/ML ~~LOC~~ SOLN
8.0000 [IU] | Freq: Once | SUBCUTANEOUS | Status: AC
  Administered 2015-08-16: 8 [IU] via SUBCUTANEOUS
  Filled 2015-08-16: qty 1

## 2015-08-16 MED ORDER — DIPHENHYDRAMINE HCL 25 MG PO CAPS
25.0000 mg | ORAL_CAPSULE | Freq: Once | ORAL | Status: AC
  Administered 2015-08-16: 25 mg via ORAL
  Filled 2015-08-16: qty 1

## 2015-08-16 MED ORDER — INSULIN ASPART 100 UNIT/ML ~~LOC~~ SOLN
0.0000 [IU] | SUBCUTANEOUS | Status: DC
  Administered 2015-08-16: 11 [IU] via SUBCUTANEOUS
  Administered 2015-08-16: 15 [IU] via SUBCUTANEOUS

## 2015-08-16 MED ORDER — LOPERAMIDE HCL 2 MG PO CAPS: 2.0000 mg | ORAL_CAPSULE | ORAL | Status: DC | PRN

## 2015-08-16 MED ORDER — LORAZEPAM 1 MG PO TABS: 1.0000 mg | ORAL_TABLET | Freq: Four times a day (QID) | ORAL | Status: DC | PRN

## 2015-08-16 MED ORDER — ACETAMINOPHEN 325 MG PO TABS: 650.0000 mg | ORAL_TABLET | Freq: Four times a day (QID) | ORAL | Status: DC | PRN

## 2015-08-16 MED ORDER — FLUOXETINE HCL 10 MG PO CAPS
10.0000 mg | ORAL_CAPSULE | Freq: Every day | ORAL | Status: DC
  Administered 2015-08-17 – 2015-08-18 (×2): 10 mg via ORAL
  Filled 2015-08-16 (×5): qty 1

## 2015-08-16 MED ORDER — INSULIN ASPART 100 UNIT/ML ~~LOC~~ SOLN
10.0000 [IU] | Freq: Three times a day (TID) | SUBCUTANEOUS | Status: DC
  Filled 2015-08-16 (×2): qty 1

## 2015-08-16 MED ORDER — INSULIN ASPART 100 UNIT/ML ~~LOC~~ SOLN
0.0000 [IU] | Freq: Three times a day (TID) | SUBCUTANEOUS | Status: DC
  Administered 2015-08-17: 20 [IU] via SUBCUTANEOUS

## 2015-08-16 MED ORDER — CHLORDIAZEPOXIDE HCL 25 MG PO CAPS
25.0000 mg | ORAL_CAPSULE | Freq: Four times a day (QID) | ORAL | Status: DC | PRN
  Administered 2015-08-17 – 2015-08-18 (×2): 25 mg via ORAL
  Filled 2015-08-16 (×2): qty 1

## 2015-08-16 MED ORDER — CHLORDIAZEPOXIDE HCL 25 MG PO CAPS
25.0000 mg | ORAL_CAPSULE | Freq: Three times a day (TID) | ORAL | Status: DC
  Administered 2015-08-18 (×2): 25 mg via ORAL
  Filled 2015-08-16 (×2): qty 1

## 2015-08-16 MED ORDER — CLONIDINE HCL 0.1 MG PO TABS
0.1000 mg | ORAL_TABLET | ORAL | Status: DC
  Filled 2015-08-16 (×3): qty 1

## 2015-08-16 MED ORDER — FLUOXETINE HCL 10 MG PO CAPS
10.0000 mg | ORAL_CAPSULE | Freq: Every day | ORAL | Status: DC
  Administered 2015-08-16: 10 mg via ORAL
  Filled 2015-08-16: qty 1

## 2015-08-16 MED ORDER — MAGNESIUM HYDROXIDE 400 MG/5ML PO SUSP: 30.0000 mL | Freq: Every day | ORAL | Status: DC | PRN

## 2015-08-16 MED ORDER — CLONIDINE HCL 0.1 MG PO TABS
0.1000 mg | ORAL_TABLET | Freq: Four times a day (QID) | ORAL | Status: DC
  Administered 2015-08-16 – 2015-08-18 (×8): 0.1 mg via ORAL
  Filled 2015-08-16 (×14): qty 1

## 2015-08-16 MED ORDER — CHLORDIAZEPOXIDE HCL 25 MG PO CAPS: 25.0000 mg | ORAL_CAPSULE | Freq: Every day | ORAL | Status: DC

## 2015-08-16 MED ORDER — NICOTINE 21 MG/24HR TD PT24
21.0000 mg | MEDICATED_PATCH | Freq: Every day | TRANSDERMAL | Status: DC
  Administered 2015-08-16 – 2015-08-17 (×2): 21 mg via TRANSDERMAL
  Filled 2015-08-16 (×6): qty 1

## 2015-08-16 MED ORDER — TRAZODONE HCL 100 MG PO TABS: 100.0000 mg | ORAL_TABLET | Freq: Every evening | ORAL | Status: DC | PRN

## 2015-08-16 MED ORDER — ACETAMINOPHEN 325 MG PO TABS: 650.0000 mg | ORAL_TABLET | ORAL | Status: DC | PRN

## 2015-08-16 MED ORDER — ACETAMINOPHEN 325 MG PO TABS
650.0000 mg | ORAL_TABLET | ORAL | Status: DC | PRN
  Filled 2015-08-16: qty 2

## 2015-08-16 MED ORDER — INSULIN ASPART 100 UNIT/ML ~~LOC~~ SOLN
20.0000 [IU] | Freq: Once | SUBCUTANEOUS | Status: AC
  Administered 2015-08-16: 20 [IU] via SUBCUTANEOUS

## 2015-08-16 MED ORDER — THIAMINE HCL 100 MG/ML IJ SOLN: 100.0000 mg | Freq: Once | INTRAMUSCULAR | Status: DC

## 2015-08-16 MED ORDER — ADULT MULTIVITAMIN W/MINERALS CH
1.0000 | ORAL_TABLET | Freq: Every day | ORAL | Status: DC
  Administered 2015-08-16 – 2015-08-18 (×3): 1 via ORAL
  Filled 2015-08-16 (×7): qty 1

## 2015-08-16 MED ORDER — INSULIN ASPART 100 UNIT/ML ~~LOC~~ SOLN: 0.0000 [IU] | SUBCUTANEOUS | Status: DC

## 2015-08-16 MED ORDER — METHOCARBAMOL 500 MG PO TABS
500.0000 mg | ORAL_TABLET | Freq: Three times a day (TID) | ORAL | Status: DC | PRN
  Administered 2015-08-17 (×2): 500 mg via ORAL
  Filled 2015-08-16 (×2): qty 1

## 2015-08-16 MED ORDER — HYDROXYZINE HCL 25 MG PO TABS
25.0000 mg | ORAL_TABLET | Freq: Four times a day (QID) | ORAL | Status: DC | PRN
  Administered 2015-08-17 – 2015-08-18 (×3): 25 mg via ORAL
  Filled 2015-08-16 (×4): qty 1

## 2015-08-16 MED ORDER — CLONIDINE HCL 0.1 MG PO TABS: 0.1000 mg | ORAL_TABLET | Freq: Every day | ORAL | Status: DC

## 2015-08-16 MED ORDER — INSULIN GLARGINE 100 UNIT/ML ~~LOC~~ SOLN
80.0000 [IU] | Freq: Every day | SUBCUTANEOUS | Status: DC
  Filled 2015-08-16: qty 0.8

## 2015-08-16 MED ORDER — TRAZODONE HCL 100 MG PO TABS
100.0000 mg | ORAL_TABLET | Freq: Every evening | ORAL | Status: DC | PRN
  Administered 2015-08-16 – 2015-08-17 (×2): 100 mg via ORAL
  Filled 2015-08-16 (×2): qty 1

## 2015-08-16 MED ORDER — INSULIN ASPART 100 UNIT/ML ~~LOC~~ SOLN: 10.0000 [IU] | Freq: Three times a day (TID) | SUBCUTANEOUS | Status: DC

## 2015-08-16 MED ORDER — HYDROXYZINE HCL 25 MG PO TABS: 25.0000 mg | ORAL_TABLET | Freq: Four times a day (QID) | ORAL | Status: DC | PRN

## 2015-08-16 MED ORDER — CHLORDIAZEPOXIDE HCL 25 MG PO CAPS: 25.0000 mg | ORAL_CAPSULE | ORAL | Status: DC

## 2015-08-16 MED ORDER — CHLORDIAZEPOXIDE HCL 25 MG PO CAPS
25.0000 mg | ORAL_CAPSULE | Freq: Four times a day (QID) | ORAL | Status: AC
  Administered 2015-08-16 – 2015-08-17 (×6): 25 mg via ORAL
  Filled 2015-08-16 (×6): qty 1

## 2015-08-16 NOTE — BH Assessment (Signed)
[facility] Assessment Progress Note  Per Corena Pilgrim, MD, this pt requires psychiatric hospitalization at this time.  Pt is under IVC initiated by her father and upheld by Dr Darleene Cleaver.  Letitia Libra, RN, [facility] has assigned pt to [facility] Rm 302-1. IVC documents have been faxed to [facility].  Pt's nurse, Nena Jordan, has been notified, and agrees to call report to [phone].  Pt is to be transported via [employer].  Jalene Mullet, [place] Triage Specialist [phone]

## 2015-08-16 NOTE — Tx Team (Signed)
Initial Interdisciplinary Treatment Plan   PATIENT STRESSORS: Medication change or noncompliance Substance abuse   PATIENT STRENGTHS: Average or above average intelligence Supportive family/friends   PROBLEM LIST: Problem List/Patient Goals Date to be addressed Date deferred Reason deferred Estimated date of resolution  "I want help with my pain." 08/16/2015     "I want help with my anxiety 08/16/2015     Opiate misuse 08/16/2015                                          DISCHARGE CRITERIA:  Improved stabilization in mood, thinking, and/or behavior  PRELIMINARY DISCHARGE PLAN: Outpatient therapy  PATIENT/FAMIILY INVOLVEMENT: This treatment plan has been presented to and reviewed with the patient, [name], and/or family member.  The patient and family have been given the opportunity to ask questions and make suggestions.  Marya Landry 08/16/2015, 4:46 PM

## 2015-08-16 NOTE — ED Notes (Signed)
Bed: [facility] Expected date: 08/16/15 Expected time: 1:51 AM Means of arrival:  Comments: Hold for triage 4

## 2015-08-16 NOTE — Consult Note (Signed)
[place] Psychiatry Consult   Reason for Consult:  Auditory and Visual hallucination, Benzodiazepine abuse Referring Physician:  EDP Patient Identification: [name] MRN:  [id number] Principal Diagnosis: Benzodiazepine abuse, continuous Diagnosis:   Patient Active Problem List   Diagnosis Date Noted  . Benzodiazepine abuse, continuous [F13.10] 08/16/2015    Priority: High  . Opioid use disorder, moderate, dependence ([place]) [F11.20] 08/16/2015  . Major depressive disorder, single episode, moderate ([place]) [F32.1] 08/16/2015  . Constipation due to opioid therapy [K59.03, T40.2X5A] 04/28/2014  . Nausea and vomiting in adult [R11.0]   . Cellulitis, abdominal wall [L03.311] 04/27/2014  . Intractable nausea and vomiting [R11.10] 04/27/2014  . Drug-seeking behavior [F19.10] 04/27/2014  . Broken toe [S92.919A] 03/30/2014  . Abdominal pain, other specified site [R10.9] 04/12/2013  . Diabetes mellitus, new onset ([place]) [E11.9] 04/12/2013  . Diarrhea [R19.7] 04/12/2013  . Clostridium difficile colitis [A04.7] 03/22/2013  . HTN (hypertension) [I10] 03/22/2013  . History of MRSA infection with recurrent vulvar abscesses [Z86.14] 03/10/2013  . Vaginal candidiasis [B37.3] 03/10/2013  . Nausea and vomiting [R11.2] 03/07/2013  . Hypokalemia [E87.6] 03/07/2013  . Acute pancreatitis [K85.90] 09/05/2012  . Nausea & vomiting [R11.2] 09/02/2012  . Abdominal pain [R10.9] 09/02/2012  . Narcotic abuse [F11.10] 07/04/2012  . DIABETES MELLITUS, TYPE II [E11.9] 05/05/2007  . 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 Microcytic anemia [D50.9] 05/05/2007  . ANXIETY [F41.1] 05/05/2007  . TOBACCO ABUSE [F17.200] 05/05/2007  . GERD [K21.9] 05/05/2007  . Backache [M54.9] 05/05/2007    Total Time spent with patient: 45 minutes  Subjective:   [name] is a 40 y.o. female patient admitted with Auditory and Visual hallucination, Benzodiazepine abuse  HPI:  AA female, 40 years  old was evaluated this morning IVC by her father for abusing Benzodiazepine and Opiates and driving while under the influence.   IVC paper also states that she is seeing and hearing things that are not there.   She was sleeping and driving while under the influence.  Patient admits to abusing both medications and stated that they were prescribed by her PMD.  Patient admits to a diagnosis of Bipolar disorder and states she has not been taking any medications in the last one year.  She denies previous inpatient [place] hospitalization.  She denies SI/HI/AVH.  She has been accepted for admission and we will be seeking placement at any facility with availble bed.  Past Psychiatric History:   Bipolar disorder.  Risk to Self: Is patient at risk for suicide?: No, but patient needs Medical Clearance Risk to Others:   Prior Inpatient Therapy:   Prior Outpatient Therapy:    Past Medical History:  Past Medical History  Diagnosis Date  . Tongue cancer ([place])   . Diabetes mellitus without complication ([place])   . Diabetes mellitus   . Headache(784.0)   . Seasonal allergies   . Abnormal Pap smear   . Gout   . Depression   . Anxiety   . UTI (urinary tract infection)   . Bipolar 1 disorder ([place])   . Kidney calculi   . Anemia   . 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 Cholecystectomy    . Esophagogastroduodenoscopy N/A 09/07/2012    Procedure: ESOPHAGOGASTRODUODENOSCOPY (EGD);  Surgeon: Juanita Craver, MD;  Location: [facility] ENDOSCOPY;  Service: Endoscopy;  Laterality: N/A;  . Cesarean section  Family History:  Family History  Problem Relation Age of Onset  . Hypertension Mother   . Diabetes Mother   . Hypertension Maternal Aunt   . Diabetes Maternal Aunt   . Hypertension Maternal Uncle   . Diabetes Maternal Uncle   . Hypertension Maternal Grandmother    Family Psychiatric  History:   Denies. Social History:  History  Alcohol Use No     History  Drug Use No    Social History   Social History  . Marital Status: Single    Spouse Name: N/A  . Number of Children: N/A  . Years of Education: N/A   Social History Main Topics  . Smoking status: Current Every Day Smoker -- 0.50 packs/day for 25 years    Types: Cigarettes  . Smokeless tobacco: Never Used  . Alcohol Use: No  . Drug Use: No  . Sexual Activity: Not Asked   Other Topics Concern  . None   Social History Narrative   ** Merged History Encounter **       ** Merged History Encounter **       Additional Social History:    Allergies:   Allergies  Allergen Reactions  . Reglan [Metoclopramide] Hives and Shortness Of Breath  . Bentyl [Dicyclomine Hcl] Hives  . Compazine [Prochlorperazine Edisylate] Hives  . Ketorolac Hives  . Morphine And Related Hives  . Zofran [Ondansetron Hcl] Hives and Nausea And Vomiting    Labs:  Results for orders placed or performed during the hospital encounter of 08/15/15 (from the past 48 hour(s))  Comprehensive metabolic panel     Status: Abnormal   Collection Time: 08/15/15  8:54 PM  Result Value Ref Range   Sodium 133 (L) 135 - 145 mmol/L   Potassium 3.9 3.5 - 5.1 mmol/L   Chloride 100 (L) 101 - 111 mmol/L   CO2 24 22 - 32 mmol/L   Glucose, Bld 463 (H) 65 - 99 mg/dL   BUN 9 6 - 20 mg/dL   Creatinine, Ser 0.60 0.44 - 1.00 mg/dL   Calcium 9.5 8.9 - 10.3 mg/dL   Total Protein 8.1 6.5 - 8.1 g/dL   Albumin 4.1 3.5 - 5.0 g/dL   AST 19 15 - 41 U/L   ALT 16 14 - 54 U/L   Alkaline Phosphatase 74 38 - 126 U/L   Total Bilirubin 0.5 0.3 - 1.2 mg/dL   GFR calc non Af Amer >60 >60 mL/min   GFR calc Af Amer >60 >60 mL/min    Comment: (NOTE) The eGFR has been calculated using the CKD EPI equation. This calculation has not been validated in all clinical situations. eGFR's persistently <60 mL/min signify possible Chronic Kidney Disease.    Anion gap 9 5 - 15  Ethanol  (ETOH)     Status: None   Collection Time: 08/15/15  8:54 PM  Result Value Ref Range   Alcohol, Ethyl (B) <5 <5 mg/dL    Comment:        LOWEST DETECTABLE LIMIT FOR SERUM ALCOHOL IS 5 mg/dL FOR MEDICAL PURPOSES ONLY   Salicylate level     Status: None   Collection Time: 08/15/15  8:54 PM  Result Value Ref Range   Salicylate Lvl <0.8 2.8 - 30.0 mg/dL  Acetaminophen level     Status: Abnormal   Collection Time: 08/15/15  8:54 PM  Result Value Ref Range   Acetaminophen (Tylenol), Serum <10 (L) 10 - 30 ug/mL    Comment:  THERAPEUTIC CONCENTRATIONS VARY SIGNIFICANTLY. A RANGE OF 10-30 ug/mL MAY BE AN EFFECTIVE CONCENTRATION FOR MANY PATIENTS. HOWEVER, SOME ARE BEST TREATED AT CONCENTRATIONS OUTSIDE THIS RANGE. ACETAMINOPHEN CONCENTRATIONS >150 ug/mL AT 4 HOURS AFTER INGESTION AND >50 ug/mL AT 12 HOURS AFTER INGESTION ARE OFTEN ASSOCIATED WITH TOXIC REACTIONS.   CBC     Status: Abnormal   Collection Time: 08/15/15  8:54 PM  Result Value Ref Range   WBC 7.3 4.0 - 10.5 K/uL    Comment: REPEATED TO VERIFY WHITE COUNT CONFIRMED ON SMEAR    RBC 5.66 (H) 3.87 - 5.11 MIL/uL   Hemoglobin 8.8 (L) 12.0 - 15.0 g/dL   HCT 32.3 (L) 36.0 - 46.0 %   MCV 57.1 (L) 78.0 - 100.0 fL   MCH 15.5 (L) 26.0 - 34.0 pg   MCHC 27.2 (L) 30.0 - 36.0 g/dL   RDW 21.3 (H) 11.5 - 15.5 %   Platelets 428 (H) 150 - 400 K/uL    Comment: REPEATED TO VERIFY SPECIMEN CHECKED FOR CLOTS PLATELET COUNT CONFIRMED BY SMEAR   Urine rapid drug screen (hosp performed) (Not at [facility])     Status: Abnormal   Collection Time: 08/15/15  8:59 PM  Result Value Ref Range   Opiates NONE DETECTED NONE DETECTED   Cocaine NONE DETECTED NONE DETECTED   Benzodiazepines POSITIVE (A) NONE DETECTED   Amphetamines NONE DETECTED NONE DETECTED   Tetrahydrocannabinol NONE DETECTED NONE DETECTED   Barbiturates NONE DETECTED NONE DETECTED    Comment:        DRUG SCREEN FOR MEDICAL PURPOSES ONLY.  IF CONFIRMATION IS NEEDED FOR  ANY PURPOSE, NOTIFY LAB WITHIN 5 DAYS.        LOWEST DETECTABLE LIMITS FOR URINE DRUG SCREEN Drug Class       Cutoff (ng/mL) Amphetamine      1000 Barbiturate      200 Benzodiazepine   191 Tricyclics       478 Opiates          300 Cocaine          300 THC              50   CBG monitoring, ED     Status: Abnormal   Collection Time: 08/16/15  1:49 AM  Result Value Ref Range   Glucose-Capillary 382 (H) 65 - 99 mg/dL  CBG monitoring, ED     Status: Abnormal   Collection Time: 08/16/15  7:57 AM  Result Value Ref Range   Glucose-Capillary 307 (H) 65 - 99 mg/dL  CBG monitoring, ED     Status: Abnormal   Collection Time: 08/16/15 12:30 PM  Result Value Ref Range   Glucose-Capillary 349 (H) 65 - 99 mg/dL    Current Facility-Administered Medications  Medication Dose Route Frequency Provider Last Rate Last Dose  . acetaminophen (TYLENOL) tablet 650 mg  650 mg Oral Q4H PRN April Palumbo, MD      . FLUoxetine (PROZAC) capsule 10 mg  10 mg Oral Daily Garielle Mroz, MD   10 mg at 08/16/15 1059  . insulin aspart (novoLOG) injection 0-15 Units  0-15 Units Subcutaneous 6 times per day Lacretia Leigh, MD   11 Units at 08/16/15 0847  . insulin aspart (novoLOG) injection 10-20 Units  10-20 Units Subcutaneous TID [facility] April Palumbo, MD   10 Units at 08/16/15 [phone]  . LORazepam (ATIVAN) tablet 1 mg  1 mg Oral Q6H PRN Corena Pilgrim, MD      . traZODone (DESYREL) tablet  100 mg  100 mg Oral QHS PRN Thedore Mins, MD       Current Outpatient Prescriptions  Medication Sig Dispense Refill  . Insulin Glargine (LANTUS SOLOSTAR) 100 UNIT/ML SOPN Inject 80 Units into the skin at bedtime. 5 pen 2  . cloNIDine (CATAPRES) 0.2 MG tablet Take 1 tablet (0.2 mg total) by mouth 2 (two) times daily. (Patient not taking: Reported on 12/15/2014) 10 tablet 0  . dicyclomine (BENTYL) 20 MG tablet Take 1 tablet (20 mg total) by mouth 2 (two) times daily. (Patient not taking: Reported on 06/15/2014) 20 tablet 0  . insulin  aspart (NOVOLOG) 100 UNIT/ML injection Inject 10-20 Units into the skin 3 (three) times daily before meals. Sliding scale (Patient taking differently: Inject 10-40 Units into the skin 3 (three) times daily before meals. Sliding scale) 1 vial 2  . oxyCODONE-acetaminophen (PERCOCET) 10-325 MG per tablet Take 1 tablet by mouth every 6 (six) hours as needed for pain. (Patient not taking: Reported on 08/15/2015) 120 tablet 0  . oxyCODONE-acetaminophen (PERCOCET) 10-325 MG tablet Take 1 tablet by mouth every 12 (twelve) hours.    . promethazine (PHENERGAN) 25 MG suppository Place 1 suppository (25 mg total) rectally every 6 (six) hours as needed for nausea or vomiting. (Patient not taking: Reported on 08/15/2015) 12 suppository 1  . [DISCONTINUED] insulin regular (NOVOLIN R,HUMULIN R) 100 units/mL injection Inject 70 Units into the skin 2 (two) times daily before a meal.      Musculoskeletal: Strength & Muscle Tone: within normal limits Gait & Station: normal Patient leans: N/A  Psychiatric Specialty Exam: Review of Systems  Constitutional: Negative.   HENT: Negative.   Eyes: Negative.   Respiratory: Negative.   Cardiovascular: Negative.   Gastrointestinal: Negative.   Genitourinary: Negative.   Musculoskeletal: Negative.   Skin: Negative.   Neurological: Negative.   Endo/Heme/Allergies: Negative.     Blood pressure 131/76, pulse 83, temperature 98 F (36.7 C), temperature source Oral, resp. rate 16, height 5\' 6"  (1.676 m), weight 88.905 kg (196 lb), last menstrual period 08/08/2015, SpO2 97 %.Body mass index is 31.65 kg/(m^2).  General Appearance: Casual  Eye Contact::  Good  Speech:  Clear and Coherent and Normal Rate  Volume:  Normal  Mood:  Anxious  Affect:  Congruent  Thought Process:  Coherent, Goal Directed and Intact  Orientation:  Full (Time, Place, and Person)  Thought Content:  Hallucinations: Auditory Visual  Suicidal Thoughts:  No  Homicidal Thoughts:  No  Memory:   Immediate;   Fair Recent;   Fair Remote;   Fair  Judgement:  Poor  Insight:  Fair  Psychomotor Activity:  Normal  Concentration:  Fair  Recall:  NA  Fund of Knowledge:Fair  Language: Good  Akathisia:  NA  Handed:  Right  AIMS (if indicated):     Assets:  Desire for Improvement  ADL's:  Intact  Cognition: WNL  Sleep:      Treatment Plan Summary: Daily contact with patient to assess and evaluate symptoms and progress in treatment and Medication management  Disposition: Accepted for admission, We will use Ativan 1 mg every 8 hours as needed for Benzo withdrawal symptoms and agitation, Prozac 10 mg po daily for depression, Trazodone 100 mg po at bed time for sleep and will offer her home medications.  [phone], NP   PMHNP-BC 08/16/2015 12:45 PM Patient seen face-to-face for psychiatric evaluation, chart reviewed and case discussed with the physician extender and developed treatment plan. Reviewed the information  documented and agree with the treatment plan. Corena Pilgrim, MD

## 2015-08-16 NOTE — Progress Notes (Signed)
Patient ID: [name], female   DOB: 01/04/1976, 40 y.o.   MRN: [id number] PER STATE REGULATIONS 482.30  THIS CHART WAS REVIEWED FOR MEDICAL NECESSITY WITH RESPECT TO THE PATIENT'S ADMISSION/DURATION OF STAY.  NEXT REVIEW DATE: 08/20/15  Roma Schanz, RN, BSN CASE MANAGER

## 2015-08-16 NOTE — ED Notes (Signed)
Pt discharged with [employer].  She went calm and cooperative.  All belongings were returned to patient.

## 2015-08-16 NOTE — Progress Notes (Signed)
Admission Note Yaquelin was admitted IVC to adult unit. Per report, pt was IVC'd after he and pt's sister became concerned that patient was abusing benzos and Percocet, including driving while under the influence and falling asleep. She denies this and says her family members were making it up. She has a previous dx of bipolar d/o but doesn't treat it. She is DM with diabetic neuropathy. She reports chronic back and right-sided pain. She denies alcohol or other illicit drug use and reports smoking about 1/2 PPD. Skin assessment unremarkable. Pt says she does not know why she is here. Report given to primary RN, Hulan Amato.

## 2015-08-16 NOTE — BH Assessment (Signed)
[place] Assessment Progress Note   While attempting to assess patient, she sleeps soundly and cannot be awoken long enough to participate in assessment.  Will attempt to assess when patient is more alert and oriented.

## 2015-08-16 NOTE — Progress Notes (Signed)
Adult Psychoeducational Group Note  Date:  08/16/2015 Time:  8:20 PM  Group Topic/Focus:  Wrap-Up Group:   The focus of this group is to help patients review their daily goal of treatment and discuss progress on daily workbooks.  Participation Level:  Minimal  Participation Quality:  Appropriate  Affect:  Appropriate  Cognitive:  Appropriate  Insight: Appropriate  Engagement in Group:  Engaged  Modes of Intervention:  Discussion  Additional Comments:  Pt was tardy to wrap-up group. Pt rated her overall day a 3 out of 10 and stated that her day was "alright". Pt reported that she did not achieve her goal for the day, which was to go home, however pt noted that the highlight of her day was seeing her family.   Lincoln Brigham 08/16/2015, 9:08 PM

## 2015-08-16 NOTE — ED Notes (Signed)
Patient appears flat. Denies SI, HI, AVH. Reports pain to right side and back. Declines offer of Tylenol. Requests snack and sleep aid. Reports feelings of anxiety. Denies any depression at present.   Encouragement offered.   Q 15 safety checks continue.

## 2015-08-17 ENCOUNTER — Encounter (HOSPITAL_COMMUNITY): Payer: Self-pay | Admitting: Psychiatry

## 2015-08-17 DIAGNOSIS — F1010 Alcohol abuse, uncomplicated: Secondary | ICD-10-CM | POA: Diagnosis present

## 2015-08-17 DIAGNOSIS — F1310 Sedative, hypnotic or anxiolytic abuse, uncomplicated: Secondary | ICD-10-CM

## 2015-08-17 DIAGNOSIS — F1114 Opioid abuse with opioid-induced mood disorder: Secondary | ICD-10-CM

## 2015-08-17 DIAGNOSIS — F331 Major depressive disorder, recurrent, moderate: Principal | ICD-10-CM

## 2015-08-17 LAB — BASIC METABOLIC PANEL
Anion gap: 13 (ref 5–15)
BUN: 13 mg/dL (ref 6–20)
CHLORIDE: 95 mmol/L — AB (ref 101–111)
CO2: 23 mmol/L (ref 22–32)
CREATININE: 0.71 mg/dL (ref 0.44–1.00)
Calcium: 9.9 mg/dL (ref 8.9–10.3)
GFR calc Af Amer: >60 mL/min (ref >60)
GFR calc non Af Amer: >60 mL/min (ref >60)
GLUCOSE: 356 mg/dL — AB (ref 65–99)
Potassium: 4.2 mmol/L (ref 3.5–5.1)
Sodium: 131 mmol/L — ABNORMAL LOW (ref 135–145)

## 2015-08-17 LAB — GLUCOSE, CAPILLARY
GLUCOSE-CAPILLARY: 321 mg/dL — AB (ref 65–99)
GLUCOSE-CAPILLARY: 344 mg/dL — AB (ref 65–99)
GLUCOSE-CAPILLARY: 321 mg/dL — AB (ref 65–99)
Glucose-Capillary: 449 mg/dL — ABNORMAL HIGH (ref 65–99)
Glucose-Capillary: 505 mg/dL — ABNORMAL HIGH (ref 65–99)
Glucose-Capillary: 442 mg/dL — ABNORMAL HIGH (ref 65–99)
Glucose-Capillary: 505 mg/dL — ABNORMAL HIGH (ref 65–99)

## 2015-08-17 MED ORDER — INSULIN GLARGINE 100 UNIT/ML ~~LOC~~ SOLN: 40.0000 [IU] | Freq: Every day | SUBCUTANEOUS | Status: DC

## 2015-08-17 MED ORDER — INSULIN ASPART 100 UNIT/ML ~~LOC~~ SOLN
10.0000 [IU] | Freq: Three times a day (TID) | SUBCUTANEOUS | Status: DC
  Administered 2015-08-18 (×2): 10 [IU] via SUBCUTANEOUS

## 2015-08-17 MED ORDER — INSULIN GLARGINE 100 UNIT/ML ~~LOC~~ SOLN
6.0000 [IU] | Freq: Once | SUBCUTANEOUS | Status: AC
  Administered 2015-08-17: 6 [IU] via SUBCUTANEOUS

## 2015-08-17 MED ORDER — INSULIN ASPART 100 UNIT/ML ~~LOC~~ SOLN
0.0000 [IU] | Freq: Every day | SUBCUTANEOUS | Status: DC
  Administered 2015-08-17: 4 [IU] via SUBCUTANEOUS

## 2015-08-17 MED ORDER — INSULIN GLARGINE 100 UNIT/ML ~~LOC~~ SOLN: 12.0000 [IU] | Freq: Every day | SUBCUTANEOUS | Status: DC

## 2015-08-17 MED ORDER — INSULIN ASPART 100 UNIT/ML ~~LOC~~ SOLN
20.0000 [IU] | Freq: Once | SUBCUTANEOUS | Status: AC
  Administered 2015-08-17: 20 [IU] via SUBCUTANEOUS

## 2015-08-17 MED ORDER — METFORMIN HCL 500 MG PO TABS
500.0000 mg | ORAL_TABLET | Freq: Two times a day (BID) | ORAL | Status: DC
  Administered 2015-08-17 (×2): 500 mg via ORAL
  Filled 2015-08-17 (×6): qty 1

## 2015-08-17 MED ORDER — INSULIN ASPART 100 UNIT/ML ~~LOC~~ SOLN
0.0000 [IU] | Freq: Three times a day (TID) | SUBCUTANEOUS | Status: DC
  Administered 2015-08-17 (×2): 15 [IU] via SUBCUTANEOUS
  Administered 2015-08-18: 20 [IU] via SUBCUTANEOUS
  Administered 2015-08-18: 11 [IU] via SUBCUTANEOUS

## 2015-08-17 MED ORDER — METFORMIN HCL 500 MG PO TABS
1000.0000 mg | ORAL_TABLET | Freq: Two times a day (BID) | ORAL | Status: DC
  Administered 2015-08-18: 1000 mg via ORAL
  Filled 2015-08-17 (×5): qty 2

## 2015-08-17 NOTE — Progress Notes (Signed)
Recreation Therapy Notes  Date: 02.22.2017 Time: 9:30am Location: [address] Group Room   Group Topic: Stress Management  Goal Area(s) Addresses:  Patient will actively participate in stress management techniques presented during session.   Behavioral Response: Did not attend.   Laureen Ochs Emonni Depasquale, LRT/CTRS  Musab Wingard L 08/17/2015 10:16 AM
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
## 2015-08-17 NOTE — Progress Notes (Signed)
Notified [facility], NP regarding CBG this am of 505.  Patient received scheduled dose of 6 U Lantus.  NP stated she will contact hospitalist for review of patient's chart.  Patient asymptomatic at this time.

## 2015-08-17 NOTE — Progress Notes (Signed)
D   Pt is cooperative and pleasant    She requested her daughter be allowed to get her cell phone out of her locker to take it back home with her   Her interactions are limited but she is compliant with treatment A   Verbal support given   Medications administered and effectiveness monitored   Q 15 min checks R   Pt safe at present

## 2015-08-17 NOTE — Progress Notes (Signed)
Patient's CBG rechecked.  Patient is at 12.  Will notify NP.

## 2015-08-17 NOTE — Progress Notes (Signed)
Pt did not attend evening NA group.  

## 2015-08-17 NOTE — Progress Notes (Signed)
Received telephone verbal order for Melinda Lo, NP for 20 U novalog one dose.  Patient continues to have 505 CBG.  Will recheck in 15 minutes.  Patient is also noncompliant with her diet.  Will page diabetic nurse for review of patient's chart.

## 2015-08-17 NOTE — Progress Notes (Signed)
D: Patient's CBGs have been running high.  It is now down to 321.  She states she is sleeping poorly and her energy is low.  She complains of withdrawal symptoms such as agitation, runny nose and nausea.  She is on the clonidine and librium protocol.  She is focused on going home.  Patient denies SI/HI/AVH.  She rates her depression and anxiety as a 5; hopelessness as a 10.  Patient appears preoccupied and cautious.  She does not seem to be responding to internal stimuli.  Patient at times can be labile. A: Continue to monitor medication management and MD orders.  Safety checks completed every 15 minutes per protocol.  Offer support and encouragement as needed. R: Patient is receptive to staff; her behavior is appropriate.

## 2015-08-17 NOTE — Progress Notes (Signed)
Inpatient Diabetes Program Recommendations  AACE/ADA: New Consensus Statement on Inpatient Glycemic Control (2015)  Target Ranges:  Prepandial:   less than 140 mg/dL      Peak postprandial:   less than 180 mg/dL (1-2 hours)      Critically ill patients:  140 - 180 mg/dL   Review of Glycemic Control  Diabetes history: DM2 Outpatient Diabetes medications: Lantus 80 units QHS, Novolog 10-40 units tidwc Current orders for Inpatient glycemic control: Novolog resistant tidwc and hs, Lantus 6 units QHS  Results for [name], [name] (MRN [id number]) as of 08/17/2015 17:51  Ref. Range 08/17/2015 07:55 08/17/2015 08:30 08/17/2015 09:14 08/17/2015 11:32 08/17/2015 16:49  Glucose-Capillary Latest Ref Range: 65-99 mg/dL 505 (H) 505 (H) 442 (H) 321 (H) 344 (H)   HgbA1C pending.  Inpatient Diabetes Program Recommendations:    Increase Lantus to at least half of home dose - 40 units QHS Add Novolog 10 units tidwc for meal coverage insulin. Continue with Novolog resistant tidwc and hs. Increase metformin to 1000 mg bid  Will follow. Have attempted to call RN to discuss.  Thank you. Lorenda Peck, RD, LDN, CDE Inpatient Diabetes Coordinator [phone]
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
## 2015-08-17 NOTE — BHH Suicide Risk Assessment (Signed)
[facility] Admission Suicide Risk Assessment   Nursing information obtained from:    Demographic factors:    Current Mental Status:    Loss Factors:    Historical Factors:    Risk Reduction Factors:     Total Time spent with patient: 45 minutes Principal Problem: Depression, major, recurrent, moderate ([place]) Diagnosis:   Patient Active Problem List   Diagnosis Date Noted  . Opioid abuse with opioid-induced mood disorder ([place]) [F11.14] 08/17/2015  . Alcohol abuse [F10.10] 08/17/2015  . Depression, major, recurrent, moderate ([place]) [F33.1] 08/17/2015  . Benzodiazepine abuse, continuous [F13.10] 08/16/2015  . Opioid use disorder, moderate, dependence ([place]) [F11.20] 08/16/2015  . Major depressive disorder, single episode, moderate ([place]) [F32.1] 08/16/2015  . Benzodiazepine abuse [F13.10] 08/16/2015  . Constipation due to opioid therapy [K59.03, T40.2X5A] 04/28/2014  . Nausea and vomiting in adult [R11.0]   . Cellulitis, abdominal wall [L03.311] 04/27/2014  . Intractable nausea and vomiting [R11.10] 04/27/2014  . Drug-seeking behavior [F19.10] 04/27/2014  . Broken toe [S92.919A] 03/30/2014  . Abdominal pain, other specified site [R10.9] 04/12/2013  . Diabetes mellitus, new onset ([place]) [E11.9] 04/12/2013  . Diarrhea [R19.7] 04/12/2013  . Clostridium difficile colitis [A04.7] 03/22/2013  . HTN (hypertension) [I10] 03/22/2013  . History of MRSA infection with recurrent vulvar abscesses [Z86.14] 03/10/2013  . Vaginal candidiasis [B37.3] 03/10/2013  . Nausea and vomiting [R11.2] 03/07/2013  . Hypokalemia [E87.6] 03/07/2013  . Acute pancreatitis [K85.90] 09/05/2012  . Nausea & vomiting [R11.2] 09/02/2012  . Abdominal pain [R10.9] 09/02/2012  . Narcotic abuse [F11.10] 07/04/2012  . DIABETES MELLITUS, TYPE II [E11.9] 05/05/2007  . 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 Microcytic anemia [D50.9] 05/05/2007  . ANXIETY [F41.1] 05/05/2007  . TOBACCO ABUSE [F17.200] 05/05/2007  .  GERD [K21.9] 05/05/2007  . Backache [M54.9] 05/05/2007   Subjective Data: see admission H and P  Continued Clinical Symptoms:  Alcohol Use Disorder Identification Test Final Score (AUDIT): 0 The "Alcohol Use Disorders Identification Test", Guidelines for Use in Primary Care, Second Edition.  World Pharmacologist [facility]). Score between 0-7:  no or low risk or alcohol related problems. Score between 8-15:  moderate risk of alcohol related problems. Score between 16-19:  high risk of alcohol related problems. Score 20 or above:  warrants further diagnostic evaluation for alcohol dependence and treatment.   CLINICAL FACTORS:   Depression:   Comorbid alcohol abuse/dependence Alcohol/Substance Abuse/Dependencies  Psychiatric Specialty Exam: ROS  Blood pressure 114/73, pulse 99, temperature 98.4 F (36.9 C), temperature source Oral, resp. rate 16, height 5\' 7"  (1.702 m), weight 91.627 kg (202 lb), last menstrual period 08/08/2015.Body mass index is 31.63 kg/(m^2).   COGNITIVE FEATURES THAT CONTRIBUTE TO RISK:  Closed-mindedness, Polarized thinking and Thought constriction (tunnel vision)    SUICIDE RISK:   Moderate:  Frequent suicidal ideation with limited intensity, and duration, some specificity in terms of plans, no associated intent, good self-control, limited dysphoria/symptomatology, some risk factors present, and identifiable protective factors, including available and accessible social support.  PLAN OF CARE: see admission H and P  I certify that inpatient services furnished can reasonably be expected to improve the patient's condition.   Nicholaus Bloom, MD 08/17/2015, 4:05 PM

## 2015-08-18 LAB — HEMOGLOBIN A1C
Hgb A1c MFr Bld: 12.9 % — ABNORMAL HIGH (ref 4.8–5.6)
Mean Plasma Glucose: 324 mg/dL

## 2015-08-18 LAB — GLUCOSE, CAPILLARY
GLUCOSE-CAPILLARY: 372 mg/dL — AB (ref 65–99)
Glucose-Capillary: 292 mg/dL — ABNORMAL HIGH (ref 65–99)

## 2015-08-18 MED ORDER — FLUOXETINE HCL 10 MG PO CAPS: 10.0000 mg | ORAL_CAPSULE | Freq: Every day | ORAL | Status: DC

## 2015-08-18 MED ORDER — NICOTINE 21 MG/24HR TD PT24: 21.0000 mg | MEDICATED_PATCH | Freq: Every day | TRANSDERMAL | Status: AC

## 2015-08-18 MED ORDER — HYDROXYZINE HCL 25 MG PO TABS: 25.0000 mg | ORAL_TABLET | Freq: Four times a day (QID) | ORAL | Status: DC | PRN

## 2015-08-18 MED ORDER — INSULIN GLARGINE 100 UNIT/ML SOLOSTAR PEN: 40.0000 [IU] | PEN_INJECTOR | Freq: Every day | SUBCUTANEOUS | Status: DC

## 2015-08-18 MED ORDER — INSULIN ASPART 100 UNIT/ML ~~LOC~~ SOLN: 10.0000 [IU] | Freq: Three times a day (TID) | SUBCUTANEOUS | Status: DC

## 2015-08-18 MED ORDER — METFORMIN HCL 1000 MG PO TABS: 1000.0000 mg | ORAL_TABLET | Freq: Two times a day (BID) | ORAL | Status: AC

## 2015-08-18 MED ORDER — TRAZODONE HCL 100 MG PO TABS: 100.0000 mg | ORAL_TABLET | Freq: Every evening | ORAL | Status: DC | PRN

## 2015-08-18 NOTE — Progress Notes (Signed)
D: Patient presents with sad, depressed affect.  She did not want to come up for morning meds.  Patient states she has back pain that is not being addressed.  Refused tylenol, states, "It doesn't do anything for me."  She denies SI/HI/AVH.  She is focused on discharge.  Her daughter wants patient to go to a long term facility for her alcohol and drug addictions.  She remains on librium and clonidine protocol. A: Continue to monitor medication management and MD orders.  Safety checks completed every 15 minutes per protocol.  Offer support and encouragement as needed. R: Patient remains isolative to room.

## 2015-08-18 NOTE — BHH Suicide Risk Assessment (Signed)
[facility] Discharge Suicide Risk Assessment   Principal Problem: Depression, major, recurrent, moderate ([place]) Discharge Diagnoses:  Patient Active Problem List   Diagnosis Date Noted  . Opioid abuse with opioid-induced mood disorder ([place]) [F11.14] 08/17/2015  . Alcohol abuse [F10.10] 08/17/2015  . Depression, major, recurrent, moderate ([place]) [F33.1] 08/17/2015  . Benzodiazepine abuse, continuous [F13.10] 08/16/2015  . Opioid use disorder, moderate, dependence ([place]) [F11.20] 08/16/2015  . Major depressive disorder, single episode, moderate ([place]) [F32.1] 08/16/2015  . Benzodiazepine abuse [F13.10] 08/16/2015  . Constipation due to opioid therapy [K59.03, T40.2X5A] 04/28/2014  . Nausea and vomiting in adult [R11.0]   . Cellulitis, abdominal wall [L03.311] 04/27/2014  . Intractable nausea and vomiting [R11.10] 04/27/2014  . Drug-seeking behavior [F19.10] 04/27/2014  . Broken toe [S92.919A] 03/30/2014  . Abdominal pain, other specified site [R10.9] 04/12/2013  . Diabetes mellitus, new onset ([place]) [E11.9] 04/12/2013  . Diarrhea [R19.7] 04/12/2013  . Clostridium difficile colitis [A04.7] 03/22/2013  . HTN (hypertension) [I10] 03/22/2013  . History of MRSA infection with recurrent vulvar abscesses [Z86.14] 03/10/2013  . Vaginal candidiasis [B37.3] 03/10/2013  . Nausea and vomiting [R11.2] 03/07/2013  . Hypokalemia [E87.6] 03/07/2013  . Acute pancreatitis [K85.90] 09/05/2012  . Nausea & vomiting [R11.2] 09/02/2012  . Abdominal pain [R10.9] 09/02/2012  . Narcotic abuse [F11.10] 07/04/2012  . DIABETES MELLITUS, TYPE II [E11.9] 05/05/2007  . 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 Microcytic anemia [D50.9] 05/05/2007  . ANXIETY [F41.1] 05/05/2007  . TOBACCO ABUSE [F17.200] 05/05/2007  . GERD [K21.9] 05/05/2007  . Backache [M54.9] 05/05/2007    Total Time spent with patient: 15 minutes  Musculoskeletal: Strength & Muscle Tone: within normal limits Gait & Station:  normal Patient leans: normal  Psychiatric Specialty Exam: Review of Systems  Constitutional: Negative.   HENT: Negative.   Eyes: Negative.   Respiratory: Negative.   Cardiovascular: Negative.   Gastrointestinal: Negative.   Genitourinary: Negative.   Musculoskeletal: Negative.   Skin: Negative.   Neurological: Negative.   Endo/Heme/Allergies: Negative.   Psychiatric/Behavioral: Positive for substance abuse.    Blood pressure 110/61, pulse 73, temperature 99 F (37.2 C), temperature source Oral, resp. rate 16, height 5\' 7"  (1.702 m), weight 91.627 kg (202 lb), last menstrual period 08/08/2015.Body mass index is 31.63 kg/(m^2).  General Appearance: Fairly Groomed  Engineer, water::  Fair  Speech:  Clear and [id number]  Volume:  Normal  Mood:  Euthymic  Affect:  Appropriate  Thought Process:  Coherent and Goal Directed  Orientation:  Full (Time, Place, and Person)  Thought Content:  plans as she moves on, relapse prevention plan  Suicidal Thoughts:  No  Homicidal Thoughts:  No  Memory:  Immediate;   Fair Recent;   Fair Remote;   Fair  Judgement:  Fair  Insight:  Present  Psychomotor Activity:  Normal  Concentration:  Fair  Recall:  [employer] of Knowledge:Fair  Language: Fair  Akathisia:  No  Handed:  Right  AIMS (if indicated):     Assets:  Desire for Improvement Housing  Sleep:  Number of Hours: 5.5  Cognition: WNL  ADL's:  Intact  In full contact with reality. There are no active S/S of withdrawal. There are no active SI plans or intent. She is willing to pursue outpatient treatment. Daughter was contacted and she has no concerns about patient's safety Mental Status Per Nursing Assessment::   On Admission:     Demographic Factors:  none identified  Loss Factors: none identified  Historical Factors: none identified  Risk Reduction Factors:   Sense of responsibility to family and Living with another person, especially a relative  Continued Clinical  Symptoms:  Alcohol/Substance Abuse/Dependencies  Cognitive Features That Contribute To Risk:  None    Suicide Risk:  Minimal: No identifiable suicidal ideation.  Patients presenting with no risk factors but with morbid ruminations; may be classified as minimal risk based on the severity of the depressive symptoms  Follow-up Information    Follow up with [facility].   Why:  Referral made 08/18/15 for therapy and medication management services. Staff should call you to schedule appointment. Call office if you do not hear from staff by Tuesday Feb. 28th to schedule appointments.    Contact information:   [address] [place], [place] [address] Office: [phone]  Fax: [phone]       Plan Of Care/Follow-up recommendations:  Activity:  as tolerated Diet:  regular Follow up as above Sharyah Bostwick A, MD 08/18/2015, 1:21 PM

## 2015-08-18 NOTE — BHH Suicide Risk Assessment (Signed)
[place] INPATIENT:  Family/Significant Other Suicide Prevention Education  Suicide Prevention Education:  Education Completed; daughter Oris Drone [phone],  (name of family member/significant other) has been identified by the patient as the family member/significant other with whom the patient will be residing, and identified as the person(s) who will aid the patient in the event of a mental health crisis (suicidal ideations/suicide attempt).  With written consent from the patient, the family member/significant other has been provided the following suicide prevention education, prior to the and/or following the discharge of the patient.  The suicide prevention education provided includes the following:  Suicide risk factors  Suicide prevention and interventions  National Suicide Hotline telephone number  [facility] assessment telephone number  [facility] Emergency Assistance [place] and/or Residential Mobile Crisis Unit telephone number  Request made of family/significant other to:  Remove weapons (e.g., guns, rifles, knives), all items previously/currently identified as safety concern.    Remove drugs/medications (over-the-counter, prescriptions, illicit drugs), all items previously/currently identified as a safety concern.  The family member/significant other verbalizes understanding of the suicide prevention education information provided.  The family member/significant other agrees to remove the items of safety concern listed above.  Nellie Chevalier, Casimiro Needle 08/18/2015, 10:34 AM

## 2015-08-18 NOTE — Progress Notes (Signed)
D   Pt is cooperative and pleasant    She appeasars to be preoccupied with her thoughts and often just has a blank stare on her face   Her interactions are limited but she is compliant with treatment A   Verbal support given   Medications administered and effectiveness monitored   Q 15 min checks R   Pt safe at present
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
## 2015-08-18 NOTE — BHH Group Notes (Signed)
Late entry:          [facility] LCSW Group Therapy 08/17/15 1:15 PM   Type of Therapy: Group Therapy  Participation Level: Did Not Attend. Patient invited to participate but declined.   Tilden Fossa, MSW, [place] Clinical Social Worker [facility] [phone]
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
## 2015-08-18 NOTE — BHH Group Notes (Signed)
The focus of this group is to educate the patient on the purpose and policies of crisis stabilization and provide a format to answer questions about their admission.  The group details unit policies and expectations of patients while admitted.  Patient did not attend 0900 nurse education orientation group this morning.  Patient stayed in bed.   
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
## 2015-08-22 NOTE — Progress Notes (Signed)
  [facility] Adult Case Management Discharge Plan :  Will you be returning to the same living situation after discharge:  Yes,  patient plans to return home At discharge, do you have transportation home?: Yes,  patient provided with bus pass Do you have the ability to pay for your medications: Yes,  patient will be provided with prescriptions at discharge  Release of information consent forms completed and in the chart;  Patient's signature needed at discharge.  Patient to Follow up at: Follow-up Information    Follow up with [facility].   Why:  Referral made 08/18/15 for therapy and medication management services. Staff should call you to schedule appointment. Call office if you do not hear from staff by Tuesday Feb. 28th to schedule appointments.    Contact information:   [address] [place], [place] [address] Office: [phone]  Fax: [phone]       Next level of care provider has access to [facility] Link:No  Safety Planning and Suicide Prevention discussed: Yes,  with patient and daughter  Have you used any form of tobacco in the last 30 days? (Cigarettes, Smokeless Tobacco, Cigars, and/or Pipes): Yes  Has patient been referred to the Quitline?: Patient refused referral  Patient has been referred for addiction treatment: Pt. refused referral  Eliyah Bazzi, Casimiro Needle 08/22/2015, 8:42 AM

## 2018-08-10 ENCOUNTER — Emergency Department (HOSPITAL_COMMUNITY): Payer: Medicare Other

## 2018-08-10 ENCOUNTER — Emergency Department (HOSPITAL_COMMUNITY)
Admission: EM | Admit: 2018-08-10 | Discharge: 2018-08-11 | Disposition: A | Discharge status: Home / Self Care | Payer: Medicare Other | Source: Home / Self Care | Attending: Emergency Medicine | Admitting: Emergency Medicine

## 2018-08-10 ENCOUNTER — Encounter (HOSPITAL_COMMUNITY): Payer: Self-pay | Admitting: Emergency Medicine

## 2018-08-10 DIAGNOSIS — R0602 Shortness of breath: Secondary | ICD-10-CM

## 2018-08-10 DIAGNOSIS — F17210 Nicotine dependence, cigarettes, uncomplicated: Secondary | ICD-10-CM | POA: Insufficient documentation

## 2018-08-10 DIAGNOSIS — I10 Essential (primary) hypertension: Secondary | ICD-10-CM | POA: Insufficient documentation

## 2018-08-10 DIAGNOSIS — D649 Anemia, unspecified: Secondary | ICD-10-CM | POA: Diagnosis not present

## 2018-08-10 DIAGNOSIS — F319 Bipolar disorder, unspecified: Secondary | ICD-10-CM

## 2018-08-10 DIAGNOSIS — E119 Type 2 diabetes mellitus without complications: Secondary | ICD-10-CM | POA: Insufficient documentation

## 2018-08-10 DIAGNOSIS — Z7984 Long term (current) use of oral hypoglycemic drugs: Secondary | ICD-10-CM

## 2018-08-10 DIAGNOSIS — I110 Hypertensive heart disease with heart failure: Secondary | ICD-10-CM | POA: Diagnosis not present

## 2018-08-10 DIAGNOSIS — R2243 Localized swelling, mass and lump, lower limb, bilateral: Secondary | ICD-10-CM

## 2018-08-10 DIAGNOSIS — Z85810 Personal history of malignant neoplasm of tongue: Secondary | ICD-10-CM | POA: Insufficient documentation

## 2018-08-10 DIAGNOSIS — R600 Localized edema: Secondary | ICD-10-CM

## 2018-08-10 DIAGNOSIS — Z79899 Other long term (current) drug therapy: Secondary | ICD-10-CM | POA: Insufficient documentation

## 2018-08-10 LAB — CBC
HEMATOCRIT: 27.3 % — AB (ref 36.0–46.0)
HEMOGLOBIN: 7.6 g/dL — AB (ref 12.0–15.0)
MCH: 16.5 pg — AB (ref 26.0–34.0)
MCHC: 27.8 g/dL — AB (ref 30.0–36.0)
MCV: 59.1 fL — ABNORMAL LOW (ref 80.0–100.0)
Platelets: 298 10*3/uL (ref 150–400)
RBC: 4.62 MIL/uL (ref 3.87–5.11)
RDW: 21 % — ABNORMAL HIGH (ref 11.5–15.5)
WBC: 13.7 10*3/uL — ABNORMAL HIGH (ref 4.0–10.5)
nRBC: 0 % (ref 0.0–0.2)

## 2018-08-10 LAB — I-STAT BETA HCG BLOOD, ED (MC, WL, AP ONLY): I-stat hCG, quantitative: <5 m[IU]/mL (ref <5)

## 2018-08-10 LAB — COMPREHENSIVE METABOLIC PANEL
ALK PHOS: 63 U/L (ref 38–126)
ALT: 14 U/L (ref 0–44)
AST: 18 U/L (ref 15–41)
Albumin: 3.1 g/dL — ABNORMAL LOW (ref 3.5–5.0)
Anion gap: 10 (ref 5–15)
BILIRUBIN TOTAL: 0.4 mg/dL (ref 0.3–1.2)
BUN: 11 mg/dL (ref 6–20)
CO2: 20 mmol/L — AB (ref 22–32)
CREATININE: 0.96 mg/dL (ref 0.44–1.00)
Calcium: 8.5 mg/dL — ABNORMAL LOW (ref 8.9–10.3)
Chloride: 105 mmol/L (ref 98–111)
GFR calc non Af Amer: >60 mL/min (ref >60)
GLUCOSE: 128 mg/dL — AB (ref 70–99)
Potassium: 3.5 mmol/L (ref 3.5–5.1)
SODIUM: 135 mmol/L (ref 135–145)
TOTAL PROTEIN: 7 g/dL (ref 6.5–8.1)

## 2018-08-10 MED ORDER — HYDROCODONE-ACETAMINOPHEN 5-325 MG PO TABS
2.0000 | ORAL_TABLET | Freq: Once | ORAL | Status: AC
  Administered 2018-08-10: 2 via ORAL
  Filled 2018-08-10: qty 2

## 2018-08-10 MED ORDER — FUROSEMIDE 10 MG/ML IJ SOLN
40.0000 mg | Freq: Once | INTRAMUSCULAR | Status: AC
  Administered 2018-08-11: 40 mg via INTRAVENOUS
  Filled 2018-08-10: qty 4

## 2018-08-10 NOTE — ED Triage Notes (Addendum)
Pt presents with bilat lower extremities edema x 2 days, no hx of CHF per pt. Pt denies new shob or cough. Pt reports low back pain with no urinary complaints. Pt states pain unrelieved by APAP, Ibuprofen and Percocet.  Pt reports emesis today.

## 2018-08-10 NOTE — ED Provider Notes (Addendum)
[facility] EMERGENCY DEPARTMENT Provider Note   CSN: [id number] Arrival date & time: 08/10/18  2047     History   Chief Complaint Chief Complaint  Patient presents with  . Leg Swelling    HPI [name] is a 43 y.o. female.  Patient is a 43 year old female with past medical history of diabetes, bipolar, anxiety.  She presents today for evaluation of leg swelling.  She has had swelling to her ankles and feet for the past 2 days that began in the absence of any injury or trauma.  She also states that she feels somewhat short of breath.  She denies any fevers, chills, or cough.  The history is provided by the patient.    Past Medical History:  Diagnosis Date  . Abnormal Pap smear   . Anemia   . Anxiety   . Bipolar 1 disorder ([place])   . Depression   . Diabetes mellitus   . Diabetes mellitus without complication ([place])   . Gout   . Headache(784.0)   . Kidney calculi   . 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 Seasonal allergies   . Tongue cancer ([place])   . UTI (urinary tract infection)     Patient Active Problem List   Diagnosis Date Noted  . Opioid abuse with opioid-induced mood disorder ([place]) 08/17/2015  . Alcohol abuse 08/17/2015  . Depression, major, recurrent, moderate ([place]) 08/17/2015  . Benzodiazepine abuse, continuous ([place]) 08/16/2015  . Opioid use disorder, moderate, dependence ([place]) 08/16/2015  . Major depressive disorder, single episode, moderate ([place]) 08/16/2015  . Benzodiazepine abuse ([place]) 08/16/2015  . Constipation due to opioid therapy 04/28/2014  . Nausea and vomiting in adult   . Cellulitis, abdominal wall 04/27/2014  . Intractable nausea and vomiting 04/27/2014  . Drug-seeking behavior 04/27/2014  . Broken toe 03/30/2014  . Abdominal pain, other specified site 04/12/2013  . Diabetes mellitus, new onset ([place]) 04/12/2013  . Diarrhea 04/12/2013  . Clostridium  difficile colitis 03/22/2013  . HTN (hypertension) 03/22/2013  . History of MRSA infection with recurrent vulvar abscesses 03/10/2013  . Vaginal candidiasis 03/10/2013  . Nausea and vomiting 03/07/2013  . Hypokalemia 03/07/2013  . Acute pancreatitis 09/05/2012  . Nausea & vomiting 09/02/2012  . Abdominal pain 09/02/2012  . Narcotic abuse ([place]) 07/04/2012  . DIABETES MELLITUS, TYPE II 05/05/2007  . DIABETIC PERIPHERAL NEUROPATHY 05/05/2007  . Microcytic anemia 05/05/2007  . ANXIETY 05/05/2007  . TOBACCO ABUSE 05/05/2007  . GERD 05/05/2007  . Backache 05/05/2007    Past Surgical History:  Procedure Laterality Date  . CESAREAN SECTION    . CHOLECYSTECTOMY    . ESOPHAGOGASTRODUODENOSCOPY N/A 09/07/2012   Procedure: ESOPHAGOGASTRODUODENOSCOPY (EGD);  Surgeon: Juanita Craver, MD;  Location: [facility] ENDOSCOPY;  Service: Endoscopy;  Laterality: N/A;     OB History    Gravida  8   Para  8   Term  7   Preterm  1   AB  0   Living  7     SAB  0   TAB      Ectopic      Multiple      Live Births               Home Medications    Prior to Admission medications   Medication Sig Start Date End Date Taking? Authorizing Provider  FLUoxetine (PROZAC) 10 MG capsule Take 1 capsule (10 mg  total) by mouth daily. For depression 08/18/15   Lindell Spar I, NP  hydrOXYzine (ATARAX/VISTARIL) 25 MG tablet Take 1 tablet (25 mg total) by mouth every 6 (six) hours as needed for anxiety. 08/18/15   Lindell Spar I, NP  insulin aspart (NOVOLOG) 100 UNIT/ML injection Inject 10-20 Units into the skin 3 (three) times daily before meals. Sliding scale: For diabetic managment 08/18/15   Lindell Spar I, NP  Insulin Glargine (LANTUS SOLOSTAR) 100 UNIT/ML Solostar Pen Inject 40 Units into the skin at bedtime. For diabetes management 08/18/15   Lindell Spar I, NP  metFORMIN (GLUCOPHAGE) 1000 MG tablet Take 1 tablet (1,000 mg total) by mouth 2 (two) times daily with a meal. For diabetes management 08/18/15    Lindell Spar I, NP  nicotine (NICODERM CQ - DOSED IN MG/24 HOURS) 21 mg/24hr patch Place 1 patch (21 mg total) onto the skin daily. For smoking cessation 08/18/15   Lindell Spar I, NP  traZODone (DESYREL) 100 MG tablet Take 1 tablet (100 mg total) by mouth at bedtime as needed for sleep. 08/18/15   Lindell Spar I, NP  insulin regular (NOVOLIN R,HUMULIN R) 100 units/mL injection Inject 70 Units into the skin 2 (two) times daily before a meal.  05/12/12  [provider]    Family History Family History  Problem Relation Age of Onset  . Hypertension Mother   . Diabetes Mother   . Hypertension Maternal Aunt   . Diabetes Maternal Aunt   . Hypertension Maternal Uncle   . Diabetes Maternal Uncle   . Hypertension Maternal Grandmother     Social History Social History   Tobacco Use  . Smoking status: Current Every Day Smoker    Packs/day: 0.50    Years: 25.00    Pack years: 12.50    Types: Cigarettes  . Smokeless tobacco: Never Used  Substance Use Topics  . Alcohol use: No    Comment: Denies  . Drug use: No    Comment: Denies     Allergies   Reglan [metoclopramide]; Bentyl [dicyclomine hcl]; Compazine [prochlorperazine edisylate]; Ketorolac; Morphine and related; and Zofran [ondansetron hcl]   Review of Systems Review of Systems  All other systems reviewed and are negative.    Physical Exam Updated Vital Signs BP (!) 104/57 (BP Location: Right Arm)   Pulse (!) 112   Temp 98.2 F (36.8 C) (Oral)   Resp 19   SpO2 100%   Physical Exam Vitals signs and nursing note reviewed.  Constitutional:      General: She is not in acute distress.    Appearance: She is well-developed. She is not diaphoretic.  HENT:     Head: Normocephalic and atraumatic.  Neck:     Musculoskeletal: Normal range of motion and neck supple.  Cardiovascular:     Rate and Rhythm: Normal rate and regular rhythm.     Heart sounds: No murmur. No friction rub. No gallop.   Pulmonary:      Effort: Pulmonary effort is normal. No respiratory distress.     Breath sounds: Normal breath sounds. No wheezing.  Abdominal:     General: Bowel sounds are normal. There is no distension.     Palpations: Abdomen is soft.     Tenderness: There is no abdominal tenderness.  Musculoskeletal: Normal range of motion.     Right lower leg: Edema present.     Left lower leg: Edema present.     Comments: There is 2-3+ pitting edema of both lower  extremities.  There is no calf tenderness.  Homans sign is absent.  Skin:    General: Skin is warm and dry.  Neurological:     Mental Status: She is alert and oriented to person, place, and time.      ED Treatments / Results  Labs (all labs ordered are listed, but only abnormal results are displayed) Labs Reviewed  CBC - Abnormal; Notable for the following components:      Result Value   WBC 13.7 (*)    Hemoglobin 7.6 (*)    HCT 27.3 (*)    MCV 59.1 (*)    MCH 16.5 (*)    MCHC 27.8 (*)    RDW 21.0 (*)    All other components within normal limits  COMPREHENSIVE METABOLIC PANEL - Abnormal; Notable for the following components:   CO2 20 (*)    Glucose, Bld 128 (*)    Calcium 8.5 (*)    Albumin 3.1 (*)    All other components within normal limits  I-STAT BETA HCG BLOOD, ED ([facility], [facility], [facility] ONLY)    EKG None  Radiology Dg Chest 2 View  Result Date: 08/10/2018 CLINICAL DATA:  Bilateral lower extremity edema x3 days. History of diabetes and smoking. EXAM: CHEST - 2 VIEW COMPARISON:  04/27/2014 FINDINGS: Cardiomegaly with nonaneurysmal thoracic aorta is redemonstrated. Mild diffuse interstitial prominence, nonspecific in etiology with superimposed faint airspace opacities raise suspicion for stigmata mild interstitial edema. Superimposed pneumonia be difficult to entirely exclude especially in the left upper and right lung base. No effusion or pneumothorax. No aggressive osseous lesions nor acute osseous appearing abnormality. Nodular densities over  the hila and upper lung on the lateral view that may represent pulmonary vessel seen on end. Pulmonary nodules or masses are not apparent in two views favoring pulmonary vasculature. IMPRESSION: 1. Stable cardiomegaly with nonaneurysmal thoracic aorta. 2. Mild diffuse interstitial prominence, nonspecific in etiology with superimposed faint airspace opacities raise suspicion for stigmata of mild interstitial edema. Superimposed pneumonia be difficult to entirely exclude. Electronically Signed   By: Ashley Royalty M.D.   On: 08/10/2018 22:06    Procedures Procedures (including critical care time)  Medications Ordered in ED Medications  HYDROcodone-acetaminophen (NORCO/VICODIN) 5-325 MG per tablet 2 tablet (has no administration in time range)     Initial Impression / Assessment and Plan / ED Course  I have reviewed the triage vital signs and the nursing notes.  Pertinent labs & imaging results that were available during my care of the patient were reviewed by me and considered in my medical decision making (see chart for details).  Patient presents here with complaints of leg swelling, the etiology of which I am uncertain.  It appears as though there could be an element of CHF as she has findings of this on her x-ray and BNP is mildly elevated.  She has a hemoglobin of 7.6 that appears microcytic.  She has been anemic in the past.  Patient was given IV Lasix here in the ER and will be discharged with oral Lasix.  I feel as though she will require an echocardiogram, but feel as though this can be performed as an outpatient.  She will be given the follow-up information for cardiology who can see her in the clinic.  Patient also complaining of back pain here in the ER which appears to be chronic.  She was initially given two hydrocodone tablets, but these apparently were ineffective.  She has requested additional pain medication on multiple  occasions here in the ER.  Upon reviewing this patient's  medical record, she has a history of opiate addiction, opiate abuse, and from what I understand did jail time recently for forging prescriptions.  I am uncomfortable giving her additional medication here in the ER and I have no intention to prescribe any opiates.  This needs to be done as an outpatient.  I will prescribe her Flexeril and she can take ibuprofen as needed.  Final Clinical Impressions(s) / ED Diagnoses   Final diagnoses:  None    ED Discharge Orders    None       Veryl Speak, MD 08/11/18 [id number]    Veryl Speak, MD 08/11/18 [phone]

## 2018-08-11 LAB — BRAIN NATRIURETIC PEPTIDE: B Natriuretic Peptide: 165.6 pg/mL — ABNORMAL HIGH (ref 0.0–100.0)

## 2018-08-11 LAB — TROPONIN I: Troponin I: <0.03 ng/mL (ref <0.03)

## 2018-08-11 MED ORDER — CYCLOBENZAPRINE HCL 10 MG PO TABS
10.0000 mg | ORAL_TABLET | Freq: Once | ORAL | Status: AC
  Administered 2018-08-11: 10 mg via ORAL
  Filled 2018-08-11: qty 1

## 2018-08-11 MED ORDER — CYCLOBENZAPRINE HCL 10 MG PO TABS: 10.0000 mg | ORAL_TABLET | Freq: Two times a day (BID) | ORAL | 0 refills | Status: DC | PRN

## 2018-08-11 MED ORDER — INSULIN ASPART 100 UNIT/ML FLEXPEN: 30.0000 [IU] | PEN_INJECTOR | Freq: Two times a day (BID) | SUBCUTANEOUS | 2 refills | Status: AC

## 2018-08-11 MED ORDER — FUROSEMIDE 20 MG PO TABS: 20.0000 mg | ORAL_TABLET | Freq: Every day | ORAL | 0 refills | Status: DC

## 2018-08-11 NOTE — ED Notes (Signed)
Pt discharged from ED; instructions provided and scripts given; Pt encouraged to return to ED if symptoms worsen and to f/u with PCP; Pt verbalized understanding of all instructions 

## 2018-08-11 NOTE — Discharge Instructions (Signed)
Ibuprofen 600 mg every 6 hours as needed for pain.  Flexeril as prescribed as needed for pain not relieved with ibuprofen.  Begin taking Lasix 20 mg daily.  You are to follow-up with cardiology to discuss an echocardiogram.  The contact information for the [facility] cardiology clinic has been provided in this discharge summary for you to call and make these arrangements.

## 2018-08-12 ENCOUNTER — Emergency Department (HOSPITAL_COMMUNITY): Payer: Medicare Other

## 2018-08-12 ENCOUNTER — Encounter (HOSPITAL_COMMUNITY): Payer: Self-pay | Admitting: Emergency Medicine

## 2018-08-12 ENCOUNTER — Inpatient Hospital Stay (HOSPITAL_COMMUNITY)
Admission: EM | Admit: 2018-08-12 | Discharge: 2018-08-15 | DRG: 291 | Disposition: A | Discharge status: Home / Self Care | Payer: Medicare Other | Attending: Internal Medicine | Admitting: Internal Medicine

## 2018-08-12 DIAGNOSIS — N920 Excessive and frequent menstruation with regular cycle: Secondary | ICD-10-CM | POA: Diagnosis present

## 2018-08-12 DIAGNOSIS — G894 Chronic pain syndrome: Secondary | ICD-10-CM | POA: Diagnosis present

## 2018-08-12 DIAGNOSIS — D649 Anemia, unspecified: Secondary | ICD-10-CM

## 2018-08-12 DIAGNOSIS — F419 Anxiety disorder, unspecified: Secondary | ICD-10-CM | POA: Diagnosis present

## 2018-08-12 DIAGNOSIS — Z791 Long term (current) use of non-steroidal anti-inflammatories (NSAID): Secondary | ICD-10-CM

## 2018-08-12 DIAGNOSIS — E1142 Type 2 diabetes mellitus with diabetic polyneuropathy: Secondary | ICD-10-CM | POA: Diagnosis present

## 2018-08-12 DIAGNOSIS — D509 Iron deficiency anemia, unspecified: Secondary | ICD-10-CM | POA: Diagnosis present

## 2018-08-12 DIAGNOSIS — Z8249 Family history of ischemic heart disease and other diseases of the circulatory system: Secondary | ICD-10-CM

## 2018-08-12 DIAGNOSIS — Z87442 Personal history of urinary calculi: Secondary | ICD-10-CM

## 2018-08-12 DIAGNOSIS — G4489 Other headache syndrome: Secondary | ICD-10-CM

## 2018-08-12 DIAGNOSIS — Z765 Malingerer [conscious simulation]: Secondary | ICD-10-CM

## 2018-08-12 DIAGNOSIS — Z885 Allergy status to narcotic agent status: Secondary | ICD-10-CM

## 2018-08-12 DIAGNOSIS — Z9049 Acquired absence of other specified parts of digestive tract: Secondary | ICD-10-CM

## 2018-08-12 DIAGNOSIS — F1120 Opioid dependence, uncomplicated: Secondary | ICD-10-CM | POA: Diagnosis present

## 2018-08-12 DIAGNOSIS — F17210 Nicotine dependence, cigarettes, uncomplicated: Secondary | ICD-10-CM | POA: Diagnosis present

## 2018-08-12 DIAGNOSIS — M109 Gout, unspecified: Secondary | ICD-10-CM | POA: Diagnosis present

## 2018-08-12 DIAGNOSIS — I509 Heart failure, unspecified: Secondary | ICD-10-CM

## 2018-08-12 DIAGNOSIS — I110 Hypertensive heart disease with heart failure: Principal | ICD-10-CM | POA: Diagnosis present

## 2018-08-12 DIAGNOSIS — Z794 Long term (current) use of insulin: Secondary | ICD-10-CM

## 2018-08-12 DIAGNOSIS — I10 Essential (primary) hypertension: Secondary | ICD-10-CM | POA: Diagnosis present

## 2018-08-12 DIAGNOSIS — D569 Thalassemia, unspecified: Secondary | ICD-10-CM | POA: Diagnosis present

## 2018-08-12 DIAGNOSIS — Z79899 Other long term (current) drug therapy: Secondary | ICD-10-CM

## 2018-08-12 DIAGNOSIS — Z888 Allergy status to other drugs, medicaments and biological substances status: Secondary | ICD-10-CM

## 2018-08-12 DIAGNOSIS — I5021 Acute systolic (congestive) heart failure: Secondary | ICD-10-CM

## 2018-08-12 DIAGNOSIS — F319 Bipolar disorder, unspecified: Secondary | ICD-10-CM | POA: Diagnosis present

## 2018-08-12 DIAGNOSIS — Z833 Family history of diabetes mellitus: Secondary | ICD-10-CM

## 2018-08-12 DIAGNOSIS — K219 Gastro-esophageal reflux disease without esophagitis: Secondary | ICD-10-CM | POA: Diagnosis present

## 2018-08-12 DIAGNOSIS — Z85810 Personal history of malignant neoplasm of tongue: Secondary | ICD-10-CM

## 2018-08-12 DIAGNOSIS — R0902 Hypoxemia: Secondary | ICD-10-CM | POA: Diagnosis present

## 2018-08-12 DIAGNOSIS — E119 Type 2 diabetes mellitus without complications: Secondary | ICD-10-CM

## 2018-08-12 DIAGNOSIS — Z8614 Personal history of Methicillin resistant Staphylococcus aureus infection: Secondary | ICD-10-CM

## 2018-08-12 LAB — URINALYSIS, ROUTINE W REFLEX MICROSCOPIC
Bilirubin Urine: NEGATIVE
Glucose, UA: NEGATIVE mg/dL
Hgb urine dipstick: NEGATIVE
Ketones, ur: 5 mg/dL — AB
Nitrite: NEGATIVE
Protein, ur: 30 mg/dL — AB
SPECIFIC GRAVITY, URINE: 1.025 (ref 1.005–1.030)
pH: 6 (ref 5.0–8.0)

## 2018-08-12 LAB — LIPASE, BLOOD: LIPASE: 17 U/L (ref 11–51)

## 2018-08-12 LAB — CBC
HCT: 28.1 % — ABNORMAL LOW (ref 36.0–46.0)
Hemoglobin: 8.1 g/dL — ABNORMAL LOW (ref 12.0–15.0)
MCH: 16.5 pg — ABNORMAL LOW (ref 26.0–34.0)
MCHC: 28.8 g/dL — ABNORMAL LOW (ref 30.0–36.0)
MCV: 57.3 fL — ABNORMAL LOW (ref 80.0–100.0)
PLATELETS: 350 10*3/uL (ref 150–400)
RBC: 4.9 MIL/uL (ref 3.87–5.11)
RDW: 21.1 % — ABNORMAL HIGH (ref 11.5–15.5)
WBC: 12.6 10*3/uL — ABNORMAL HIGH (ref 4.0–10.5)
nRBC: 0 % (ref 0.0–0.2)

## 2018-08-12 LAB — COMPREHENSIVE METABOLIC PANEL
ALT: 16 U/L (ref 0–44)
AST: 27 U/L (ref 15–41)
Albumin: 2.9 g/dL — ABNORMAL LOW (ref 3.5–5.0)
Alkaline Phosphatase: 76 U/L (ref 38–126)
Anion gap: 13 (ref 5–15)
BUN: 6 mg/dL (ref 6–20)
CO2: 21 mmol/L — ABNORMAL LOW (ref 22–32)
Calcium: 8.4 mg/dL — ABNORMAL LOW (ref 8.9–10.3)
Chloride: 104 mmol/L (ref 98–111)
Creatinine, Ser: 0.61 mg/dL (ref 0.44–1.00)
GFR calc Af Amer: >60 mL/min (ref >60)
GFR calc non Af Amer: >60 mL/min (ref >60)
Glucose, Bld: 155 mg/dL — ABNORMAL HIGH (ref 70–99)
Potassium: 3.2 mmol/L — ABNORMAL LOW (ref 3.5–5.1)
Sodium: 138 mmol/L (ref 135–145)
Total Bilirubin: 0.7 mg/dL (ref 0.3–1.2)
Total Protein: 7.1 g/dL (ref 6.5–8.1)

## 2018-08-12 LAB — I-STAT BETA HCG BLOOD, ED (MC, WL, AP ONLY)

## 2018-08-12 MED ORDER — PROMETHAZINE HCL 25 MG/ML IJ SOLN
12.5000 mg | Freq: Once | INTRAMUSCULAR | Status: AC
  Administered 2018-08-13: 12.5 mg via INTRAVENOUS
  Filled 2018-08-12: qty 1

## 2018-08-12 MED ORDER — SODIUM CHLORIDE 0.9% FLUSH
3.0000 mL | Freq: Once | INTRAVENOUS | Status: AC
  Administered 2018-08-12: 3 mL via INTRAVENOUS

## 2018-08-12 MED ORDER — HYDROMORPHONE HCL 1 MG/ML IJ SOLN
0.5000 mg | Freq: Once | INTRAMUSCULAR | Status: AC
  Administered 2018-08-13: 0.5 mg via INTRAVENOUS
  Filled 2018-08-12: qty 1

## 2018-08-12 MED ORDER — FUROSEMIDE 10 MG/ML IJ SOLN
40.0000 mg | Freq: Once | INTRAMUSCULAR | Status: AC
  Administered 2018-08-13: 40 mg via INTRAVENOUS
  Filled 2018-08-12: qty 4

## 2018-08-12 NOTE — ED Triage Notes (Signed)
Pt reports h/a, n/v, and Sob x 1 day.

## 2018-08-12 NOTE — ED Provider Notes (Signed)
[facility] EMERGENCY DEPARTMENT Provider Note   CSN: [id number] Arrival date & time: 08/12/18  1920    History   Chief Complaint Chief Complaint  Patient presents with  . Shortness of Breath  . Nausea  . Emesis  . Headache    HPI [name] is a 43 y.o. female.     The history is provided by the patient.  Shortness of Breath  Severity:  Moderate Onset quality:  Gradual Duration:  4 days Timing:  Intermittent Progression:  Worsening Chronicity:  New Context: activity   Relieved by:  Rest and sitting up Worsened by:  Activity Ineffective treatments:  Diuretics Associated symptoms: cough, headaches, PND and vomiting   Associated symptoms: no abdominal pain, no chest pain, no fever, no sore throat and no syncope   Risk factors: tobacco use   Emesis  Associated symptoms: cough and headaches   Associated symptoms: no abdominal pain, no fever and no sore throat   Headache  Associated symptoms: cough, nausea and vomiting   Associated symptoms: no abdominal pain, no fever, no sore throat and no syncope   Patient reports onset of shortness of breath several days ago.  It is worse with exertion and improves with rest.  Also is worse with lying flat.  She also has noted increasing swelling in her legs and her abdomen. Denies chest pain/fever.  No sore throat. She does report dry cough. She reports associated nausea and vomiting with migraine headache that is typical for her and similar to prior No known history of CAD or CHF.  Denies active drug use. No recent travel. She was seen in the ER on February 16.  Started on Lasix for peripheral edema, which she reports she has not had significant urine output  Soc hx - smoker, denies vaping, no cocaine use Past Medical History:  Diagnosis Date  . Abnormal Pap smear   . Anemia   . Anxiety   . Bipolar 1 disorder ([place])   . Depression   . Diabetes mellitus   . Diabetes mellitus without complication ([place])    . Gout   . Headache(784.0)   . Kidney calculi   . 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 Seasonal allergies   . Tongue cancer ([place])   . UTI (urinary tract infection)     Patient Active Problem List   Diagnosis Date Noted  . Opioid abuse with opioid-induced mood disorder ([place]) 08/17/2015  . Alcohol abuse 08/17/2015  . Depression, major, recurrent, moderate ([place]) 08/17/2015  . Benzodiazepine abuse, continuous ([place]) 08/16/2015  . Opioid use disorder, moderate, dependence ([place]) 08/16/2015  . Major depressive disorder, single episode, moderate ([place]) 08/16/2015  . Benzodiazepine abuse ([place]) 08/16/2015  . Constipation due to opioid therapy 04/28/2014  . Nausea and vomiting in adult   . Cellulitis, abdominal wall 04/27/2014  . Intractable nausea and vomiting 04/27/2014  . Drug-seeking behavior 04/27/2014  . Broken toe 03/30/2014  . Abdominal pain, other specified site 04/12/2013  . Diabetes mellitus, new onset ([place]) 04/12/2013  . Diarrhea 04/12/2013  . Clostridium difficile colitis 03/22/2013  . HTN (hypertension) 03/22/2013  . History of MRSA infection with recurrent vulvar abscesses 03/10/2013  . Vaginal candidiasis 03/10/2013  . Nausea and vomiting 03/07/2013  . Hypokalemia 03/07/2013  . Acute pancreatitis 09/05/2012  . Nausea & vomiting 09/02/2012  . Abdominal pain 09/02/2012  . Narcotic abuse ([place]) 07/04/2012  . DIABETES MELLITUS, TYPE  II 05/05/2007  . DIABETIC PERIPHERAL NEUROPATHY 05/05/2007  . Microcytic anemia 05/05/2007  . ANXIETY 05/05/2007  . TOBACCO ABUSE 05/05/2007  . GERD 05/05/2007  . Backache 05/05/2007    Past Surgical History:  Procedure Laterality Date  . CESAREAN SECTION    . CHOLECYSTECTOMY    . ESOPHAGOGASTRODUODENOSCOPY N/A 09/07/2012   Procedure: ESOPHAGOGASTRODUODENOSCOPY (EGD);  Surgeon: Juanita Craver, MD;  Location: [facility] ENDOSCOPY;  Service: Endoscopy;  Laterality: N/A;     OB History     Gravida  8   Para  8   Term  7   Preterm  1   AB  0   Living  7     SAB  0   TAB      Ectopic      Multiple      Live Births               Home Medications    Prior to Admission medications   Medication Sig Start Date End Date Taking? Authorizing Provider  cyclobenzaprine (FLEXERIL) 10 MG tablet Take 1 tablet (10 mg total) by mouth 2 (two) times daily as needed for muscle spasms. 08/11/18   Veryl Speak, MD  FLUoxetine (PROZAC) 10 MG capsule Take 1 capsule (10 mg total) by mouth daily. For depression 08/18/15   Lindell Spar I, NP  furosemide (LASIX) 20 MG tablet Take 1 tablet (20 mg total) by mouth daily. 08/11/18   Veryl Speak, MD  hydrOXYzine (ATARAX/VISTARIL) 25 MG tablet Take 1 tablet (25 mg total) by mouth every 6 (six) hours as needed for anxiety. 08/18/15   Lindell Spar I, NP  insulin aspart (NOVOLOG FLEXPEN) 100 UNIT/ML FlexPen Inject 30 Units into the skin 2 (two) times daily with a meal. 08/11/18   Veryl Speak, MD  Insulin Glargine (LANTUS SOLOSTAR) 100 UNIT/ML Solostar Pen Inject 40 Units into the skin at bedtime. For diabetes management 08/18/15   Lindell Spar I, NP  metFORMIN (GLUCOPHAGE) 1000 MG tablet Take 1 tablet (1,000 mg total) by mouth 2 (two) times daily with a meal. For diabetes management 08/18/15   Lindell Spar I, NP  nicotine (NICODERM CQ - DOSED IN MG/24 HOURS) 21 mg/24hr patch Place 1 patch (21 mg total) onto the skin daily. For smoking cessation 08/18/15   Lindell Spar I, NP  traZODone (DESYREL) 100 MG tablet Take 1 tablet (100 mg total) by mouth at bedtime as needed for sleep. 08/18/15   Encarnacion Slates, NP    Family History Family History  Problem Relation Age of Onset  . Hypertension Mother   . Diabetes Mother   . Hypertension Maternal Aunt   . Diabetes Maternal Aunt   . Hypertension Maternal Uncle   . Diabetes Maternal Uncle   . Hypertension Maternal Grandmother     Social History Social History   Tobacco Use  . Smoking  status: Current Every Day Smoker    Packs/day: 0.50    Years: 25.00    Pack years: 12.50    Types: Cigarettes  . Smokeless tobacco: Never Used  Substance Use Topics  . Alcohol use: No    Comment: Denies  . Drug use: No    Comment: Denies     Allergies   Reglan [metoclopramide]; Bentyl [dicyclomine hcl]; Compazine [prochlorperazine edisylate]; Ketorolac; Morphine and related; and Zofran [ondansetron hcl]   Review of Systems Review of Systems  Constitutional: Negative for fever.  HENT: Negative for sore throat.   Respiratory: Positive for cough and shortness of breath.  Cardiovascular: Positive for PND. Negative for chest pain and syncope.  Gastrointestinal: Positive for nausea and vomiting. Negative for abdominal pain.  Neurological: Positive for headaches.  All other systems reviewed and are negative.    Physical Exam Updated Vital Signs BP (!) 144/72 (BP Location: Right Arm)   Pulse (!) 117   Temp 99.2 F (37.3 C) (Oral)   Resp 20   LMP 07/08/2018 (Approximate)   SpO2 93%   Physical Exam CONSTITUTIONAL: Well developed/well nourished HEAD: Normocephalic/atraumatic EYES: EOMI/PERRL ENMT: Mucous membranes moist, uvula midline without erythema or exudate NECK: supple no meningeal signs,+ JVD SPINE/BACK:entire spine nontender CV: S1/S2 noted, no murmurs/rubs/gallops noted LUNGS: Crackles bilaterally, mild tachypnea Pulse ox 92% on room air ABDOMEN: soft, nontender, no rebound or guarding, bowel sounds noted throughout abdomen GU:no cva tenderness NEURO: Pt is awake/alert/appropriate, moves all extremitiesx4.  No facial droop.  No arm or leg drift, mental status appropriate EXTREMITIES: pulses normal/equal, full ROM, calf tenderness, + LE edema noted SKIN: warm, color normal PSYCH: no abnormalities of mood noted, alert and oriented to situation  ED Treatments / Results  Labs (all labs ordered are listed, but only abnormal results are displayed) Labs Reviewed    COMPREHENSIVE METABOLIC PANEL - Abnormal; Notable for the following components:      Result Value   Potassium 3.2 (*)    CO2 21 (*)    Glucose, Bld 155 (*)    Calcium 8.4 (*)    Albumin 2.9 (*)    All other components within normal limits  CBC - Abnormal; Notable for the following components:   WBC 12.6 (*)    Hemoglobin 8.1 (*)    HCT 28.1 (*)    MCV 57.3 (*)    MCH 16.5 (*)    MCHC 28.8 (*)    RDW 21.1 (*)    All other components within normal limits  URINALYSIS, ROUTINE W REFLEX MICROSCOPIC - Abnormal; Notable for the following components:   APPearance CLOUDY (*)    Ketones, ur 5 (*)    Protein, ur 30 (*)    Leukocytes,Ua SMALL (*)    Bacteria, UA RARE (*)    All other components within normal limits  BRAIN NATRIURETIC PEPTIDE - Abnormal; Notable for the following components:   B Natriuretic Peptide 182.8 (*)    All other components within normal limits  RAPID URINE DRUG SCREEN, HOSP PERFORMED - Abnormal; Notable for the following components:   Opiates POSITIVE (*)    All other components within normal limits  LIPASE, BLOOD  I-STAT BETA HCG BLOOD, ED ([facility], [facility], [facility] ONLY)    EKG EKG Interpretation  Date/Time:  Tuesday August 12 2018 19:36:40 EST Ventricular Rate:  119 PR Interval:  172 QRS Duration: 96 QT Interval:  310 QTC Calculation: 436 R Axis:   85 Text Interpretation:  Sinus tachycardia Septal infarct , age undetermined Abnormal ECG No previous ECGs available Confirmed by Ripley Fraise [phone]) on 08/12/2018 11:26:45 PM   Radiology Dg Chest 2 View  Result Date: 08/12/2018 CLINICAL DATA:  Shortness of breath EXAM: CHEST - 2 VIEW COMPARISON:  08/10/2018 FINDINGS: Moderate-to-marked bilateral ground-glass opacity and consolidations, progressed since prior radiograph. No pleural effusion. Stable borderline to mild cardiomegaly. No pneumothorax. IMPRESSION: Moderate bilateral ground-glass opacity and consolidations which may reflect edema or bilateral pneumonia.  Borderline to mild cardiomegaly. Findings are progressed since 08/10/2018 Electronically Signed   By: Donavan Foil M.D.   On: 08/12/2018 20:01    Procedures Procedures  CRITICAL CARE Performed  by: Sharyon Cable Total critical care time: 33 minutes Critical care time was exclusive of separately billable procedures and treating other patients. Critical care was necessary to treat or prevent imminent or life-threatening deterioration. Critical care was time spent personally by me on the following activities: development of treatment plan with patient and/or surrogate as well as nursing, discussions with consultants, evaluation of patient's response to treatment, examination of patient, obtaining history from patient or surrogate, ordering and performing treatments and interventions, ordering and review of laboratory studies, ordering and review of radiographic studies, pulse oximetry and re-evaluation of patient's condition. Patient with new onset CHF requiring Lasix and nitroglycerin Patient is tachycardic to the 120s as well as mild hypoxia Patient will require admission  Medications Ordered in ED Medications  nitroGLYCERIN (NITROGLYN) 2 % ointment 0.5 inch (has no administration in time range)  sodium chloride flush (NS) 0.9 % injection 3 mL (3 mLs Intravenous Given 08/12/18 2359)  furosemide (LASIX) injection 40 mg (40 mg Intravenous Given 08/13/18 0004)  promethazine (PHENERGAN) injection 12.5 mg (12.5 mg Intravenous Given 08/13/18 0005)  HYDROmorphone (DILAUDID) injection 0.5 mg (0.5 mg Intravenous Given 08/13/18 0005)     Initial Impression / Assessment and Plan / ED Course  I have reviewed the triage vital signs and the nursing notes.  Pertinent labs & imaging results that were available during my care of the patient were reviewed by me and considered in my medical decision making (see chart for details).        11:53 PM Patient presents with increasing lower extremity edema  with shortness of breath.  She appears to have worsening heart failure, but denies previous diagnosis. There is no echocardiogram in her charts Patient also reports typical migraine headache that only resolves with Phenergan and Dilaudid as she has multiple allergies to all appropriate meds for HA We will treat headache for now, but then will likely need nitroglycerin. Anticipate admission for new onset CHF with failure to improve with Lasix at home Likely CHF rather than PNA (no fever, no sore throat/myalgias) 12:35 AM Patient has been given Lasix and nitroglycerin for CHF.  Her headache is also been treated, and she reports this is improved.  No focal weakness noted to suggest any acute neurologic emergency Plan will be to admit for new onset CHF.  I discussed the case with Dr. Hal Hope for admission   Pt denies pleuritic CP to suggest PE No acute EKG changes Final Clinical Impressions(s) / ED Diagnoses   Final diagnoses:  Acute systolic congestive heart failure ([place])  Other headache syndrome  Chronic anemia    ED Discharge Orders    None       Ripley Fraise, MD 08/13/18 [phone]

## 2018-08-13 ENCOUNTER — Inpatient Hospital Stay (HOSPITAL_COMMUNITY): Payer: Medicare Other

## 2018-08-13 ENCOUNTER — Other Ambulatory Visit: Payer: Self-pay

## 2018-08-13 ENCOUNTER — Encounter (HOSPITAL_COMMUNITY): Payer: Self-pay | Admitting: Internal Medicine

## 2018-08-13 DIAGNOSIS — Z85810 Personal history of malignant neoplasm of tongue: Secondary | ICD-10-CM | POA: Diagnosis not present

## 2018-08-13 DIAGNOSIS — F419 Anxiety disorder, unspecified: Secondary | ICD-10-CM | POA: Diagnosis present

## 2018-08-13 DIAGNOSIS — Z9049 Acquired absence of other specified parts of digestive tract: Secondary | ICD-10-CM | POA: Diagnosis not present

## 2018-08-13 DIAGNOSIS — I10 Essential (primary) hypertension: Secondary | ICD-10-CM

## 2018-08-13 DIAGNOSIS — D509 Iron deficiency anemia, unspecified: Secondary | ICD-10-CM | POA: Diagnosis not present

## 2018-08-13 DIAGNOSIS — F1120 Opioid dependence, uncomplicated: Secondary | ICD-10-CM | POA: Diagnosis present

## 2018-08-13 DIAGNOSIS — Z8249 Family history of ischemic heart disease and other diseases of the circulatory system: Secondary | ICD-10-CM | POA: Diagnosis not present

## 2018-08-13 DIAGNOSIS — M109 Gout, unspecified: Secondary | ICD-10-CM | POA: Diagnosis present

## 2018-08-13 DIAGNOSIS — K219 Gastro-esophageal reflux disease without esophagitis: Secondary | ICD-10-CM | POA: Diagnosis present

## 2018-08-13 DIAGNOSIS — E1142 Type 2 diabetes mellitus with diabetic polyneuropathy: Secondary | ICD-10-CM | POA: Diagnosis present

## 2018-08-13 DIAGNOSIS — I509 Heart failure, unspecified: Secondary | ICD-10-CM | POA: Diagnosis not present

## 2018-08-13 DIAGNOSIS — I5021 Acute systolic (congestive) heart failure: Secondary | ICD-10-CM | POA: Diagnosis present

## 2018-08-13 DIAGNOSIS — I340 Nonrheumatic mitral (valve) insufficiency: Secondary | ICD-10-CM | POA: Diagnosis not present

## 2018-08-13 DIAGNOSIS — E119 Type 2 diabetes mellitus without complications: Secondary | ICD-10-CM

## 2018-08-13 DIAGNOSIS — R0902 Hypoxemia: Secondary | ICD-10-CM | POA: Diagnosis present

## 2018-08-13 DIAGNOSIS — Z8614 Personal history of Methicillin resistant Staphylococcus aureus infection: Secondary | ICD-10-CM | POA: Diagnosis not present

## 2018-08-13 DIAGNOSIS — Z791 Long term (current) use of non-steroidal anti-inflammatories (NSAID): Secondary | ICD-10-CM | POA: Diagnosis not present

## 2018-08-13 DIAGNOSIS — F319 Bipolar disorder, unspecified: Secondary | ICD-10-CM | POA: Diagnosis present

## 2018-08-13 DIAGNOSIS — Z833 Family history of diabetes mellitus: Secondary | ICD-10-CM | POA: Diagnosis not present

## 2018-08-13 DIAGNOSIS — G894 Chronic pain syndrome: Secondary | ICD-10-CM | POA: Diagnosis present

## 2018-08-13 DIAGNOSIS — N920 Excessive and frequent menstruation with regular cycle: Secondary | ICD-10-CM | POA: Diagnosis present

## 2018-08-13 DIAGNOSIS — I110 Hypertensive heart disease with heart failure: Secondary | ICD-10-CM | POA: Diagnosis present

## 2018-08-13 DIAGNOSIS — F17210 Nicotine dependence, cigarettes, uncomplicated: Secondary | ICD-10-CM | POA: Diagnosis present

## 2018-08-13 DIAGNOSIS — Z885 Allergy status to narcotic agent status: Secondary | ICD-10-CM | POA: Diagnosis not present

## 2018-08-13 DIAGNOSIS — D649 Anemia, unspecified: Secondary | ICD-10-CM

## 2018-08-13 DIAGNOSIS — Z79899 Other long term (current) drug therapy: Secondary | ICD-10-CM | POA: Diagnosis not present

## 2018-08-13 DIAGNOSIS — Z888 Allergy status to other drugs, medicaments and biological substances status: Secondary | ICD-10-CM | POA: Diagnosis not present

## 2018-08-13 DIAGNOSIS — I361 Nonrheumatic tricuspid (valve) insufficiency: Secondary | ICD-10-CM | POA: Diagnosis not present

## 2018-08-13 DIAGNOSIS — D569 Thalassemia, unspecified: Secondary | ICD-10-CM | POA: Diagnosis present

## 2018-08-13 DIAGNOSIS — Z794 Long term (current) use of insulin: Secondary | ICD-10-CM | POA: Diagnosis not present

## 2018-08-13 LAB — RETICULOCYTES
Immature Retic Fract: 4.2 % (ref 2.3–15.9)
RBC.: 4.38 MIL/uL (ref 3.87–5.11)
Retic Count, Absolute: 45.6 10*3/uL (ref 19.0–186.0)
Retic Ct Pct: 1 % (ref 0.4–3.1)

## 2018-08-13 LAB — CBG MONITORING, ED
GLUCOSE-CAPILLARY: 87 mg/dL (ref 70–99)
GLUCOSE-CAPILLARY: 80 mg/dL (ref 70–99)
Glucose-Capillary: 79 mg/dL (ref 70–99)

## 2018-08-13 LAB — ECHOCARDIOGRAM COMPLETE
Height: 66 in
Weight: 3363.2 oz

## 2018-08-13 LAB — IRON AND TIBC
Iron: 12 ug/dL — ABNORMAL LOW (ref 28–170)
Saturation Ratios: 4 % — ABNORMAL LOW (ref 10.4–31.8)
TIBC: 312 ug/dL (ref 250–450)
UIBC: 300 ug/dL

## 2018-08-13 LAB — CBC
HCT: 26 % — ABNORMAL LOW (ref 36.0–46.0)
Hemoglobin: 7.6 g/dL — ABNORMAL LOW (ref 12.0–15.0)
MCH: 16.6 pg — ABNORMAL LOW (ref 26.0–34.0)
MCHC: 29.2 g/dL — ABNORMAL LOW (ref 30.0–36.0)
MCV: 56.6 fL — ABNORMAL LOW (ref 80.0–100.0)
Platelets: 306 10*3/uL (ref 150–400)
RBC: 4.59 MIL/uL (ref 3.87–5.11)
RDW: 20.6 % — AB (ref 11.5–15.5)
WBC: 12.8 10*3/uL — ABNORMAL HIGH (ref 4.0–10.5)
nRBC: 0 % (ref 0.0–0.2)

## 2018-08-13 LAB — RAPID URINE DRUG SCREEN, HOSP PERFORMED
Amphetamines: NOT DETECTED
Barbiturates: NOT DETECTED
Benzodiazepines: NOT DETECTED
Cocaine: NOT DETECTED
Opiates: POSITIVE — AB
Tetrahydrocannabinol: NOT DETECTED

## 2018-08-13 LAB — GLUCOSE, CAPILLARY
Glucose-Capillary: 124 mg/dL — ABNORMAL HIGH (ref 70–99)
Glucose-Capillary: 211 mg/dL — ABNORMAL HIGH (ref 70–99)

## 2018-08-13 LAB — PROCALCITONIN: Procalcitonin: <0.1 ng/mL

## 2018-08-13 LAB — ABO/RH: ABO/RH(D): B POS

## 2018-08-13 LAB — BASIC METABOLIC PANEL
Anion gap: 9 (ref 5–15)
BUN: 7 mg/dL (ref 6–20)
CO2: 24 mmol/L (ref 22–32)
Calcium: 8 mg/dL — ABNORMAL LOW (ref 8.9–10.3)
Chloride: 107 mmol/L (ref 98–111)
Creatinine, Ser: 0.61 mg/dL (ref 0.44–1.00)
GFR calc Af Amer: >60 mL/min (ref >60)
GFR calc non Af Amer: >60 mL/min (ref >60)
Glucose, Bld: 108 mg/dL — ABNORMAL HIGH (ref 70–99)
Potassium: 2.8 mmol/L — ABNORMAL LOW (ref 3.5–5.1)
SODIUM: 140 mmol/L (ref 135–145)

## 2018-08-13 LAB — TROPONIN I: Troponin I: <0.03 ng/mL (ref <0.03)

## 2018-08-13 LAB — VITAMIN B12: VITAMIN B 12: 515 pg/mL (ref 180–914)

## 2018-08-13 LAB — FERRITIN: Ferritin: 23 ng/mL (ref 11–307)

## 2018-08-13 LAB — BRAIN NATRIURETIC PEPTIDE: B Natriuretic Peptide: 182.8 pg/mL — ABNORMAL HIGH (ref 0.0–100.0)

## 2018-08-13 LAB — FOLATE: FOLATE: 8.2 ng/mL (ref >5.9)

## 2018-08-13 LAB — MAGNESIUM: Magnesium: 1.5 mg/dL — ABNORMAL LOW (ref 1.7–2.4)

## 2018-08-13 LAB — TSH: TSH: 1.882 u[IU]/mL (ref 0.350–4.500)

## 2018-08-13 LAB — HIV ANTIBODY (ROUTINE TESTING W REFLEX): HIV Screen 4th Generation wRfx: NONREACTIVE

## 2018-08-13 LAB — PREPARE RBC (CROSSMATCH)

## 2018-08-13 LAB — SEDIMENTATION RATE: SED RATE: 55 mm/h — AB (ref 0–22)

## 2018-08-13 MED ORDER — HYDROMORPHONE HCL 1 MG/ML IJ SOLN
0.5000 mg | Freq: Once | INTRAMUSCULAR | Status: AC
  Administered 2018-08-13: 0.5 mg via INTRAVENOUS
  Filled 2018-08-13: qty 1

## 2018-08-13 MED ORDER — POTASSIUM CHLORIDE CRYS ER 20 MEQ PO TBCR
40.0000 meq | EXTENDED_RELEASE_TABLET | Freq: Once | ORAL | Status: AC
  Administered 2018-08-13: 40 meq via ORAL
  Filled 2018-08-13: qty 2

## 2018-08-13 MED ORDER — NITROGLYCERIN 2 % TD OINT
0.5000 [in_us] | TOPICAL_OINTMENT | Freq: Once | TRANSDERMAL | Status: AC
  Administered 2018-08-13: 0.5 [in_us] via TOPICAL
  Filled 2018-08-13: qty 1

## 2018-08-13 MED ORDER — INSULIN GLARGINE 100 UNIT/ML ~~LOC~~ SOLN
60.0000 [IU] | Freq: Every day | SUBCUTANEOUS | Status: DC
  Administered 2018-08-13 – 2018-08-14 (×2): 60 [IU] via SUBCUTANEOUS
  Filled 2018-08-13 (×2): qty 0.6

## 2018-08-13 MED ORDER — LISINOPRIL 10 MG PO TABS
10.0000 mg | ORAL_TABLET | Freq: Every day | ORAL | Status: DC
  Administered 2018-08-13 – 2018-08-15 (×3): 10 mg via ORAL
  Filled 2018-08-13 (×3): qty 1

## 2018-08-13 MED ORDER — AMITRIPTYLINE HCL 75 MG PO TABS
150.0000 mg | ORAL_TABLET | Freq: Every day | ORAL | Status: DC
  Administered 2018-08-13 – 2018-08-14 (×2): 150 mg via ORAL
  Filled 2018-08-13: qty 2
  Filled 2018-08-13: qty 6
  Filled 2018-08-13: qty 2

## 2018-08-13 MED ORDER — PROMETHAZINE HCL 25 MG PO TABS
12.5000 mg | ORAL_TABLET | Freq: Four times a day (QID) | ORAL | Status: DC | PRN
  Administered 2018-08-13 (×2): 12.5 mg via ORAL
  Filled 2018-08-13 (×3): qty 1

## 2018-08-13 MED ORDER — INSULIN ASPART 100 UNIT/ML ~~LOC~~ SOLN: 40.0000 [IU] | Freq: Two times a day (BID) | SUBCUTANEOUS | Status: DC

## 2018-08-13 MED ORDER — INSULIN GLARGINE 100 UNIT/ML ~~LOC~~ SOLN
100.0000 [IU] | Freq: Every day | SUBCUTANEOUS | Status: DC
  Administered 2018-08-13: 100 [IU] via SUBCUTANEOUS
  Filled 2018-08-13: qty 1

## 2018-08-13 MED ORDER — SODIUM CHLORIDE 0.9% IV SOLUTION: Freq: Once | INTRAVENOUS | Status: DC

## 2018-08-13 MED ORDER — INSULIN ASPART 100 UNIT/ML ~~LOC~~ SOLN
0.0000 [IU] | Freq: Three times a day (TID) | SUBCUTANEOUS | Status: DC
  Administered 2018-08-13: 1 [IU] via SUBCUTANEOUS
  Administered 2018-08-14: 3 [IU] via SUBCUTANEOUS
  Administered 2018-08-14: 5 [IU] via SUBCUTANEOUS
  Administered 2018-08-14: 2 [IU] via SUBCUTANEOUS
  Administered 2018-08-15: 5 [IU] via SUBCUTANEOUS

## 2018-08-13 MED ORDER — ACETAMINOPHEN 325 MG PO TABS
650.0000 mg | ORAL_TABLET | Freq: Four times a day (QID) | ORAL | Status: DC | PRN
  Administered 2018-08-13 – 2018-08-14 (×2): 650 mg via ORAL
  Filled 2018-08-13 (×2): qty 2

## 2018-08-13 MED ORDER — PROMETHAZINE HCL 25 MG/ML IJ SOLN
12.5000 mg | Freq: Once | INTRAMUSCULAR | Status: AC
  Administered 2018-08-13: 12.5 mg via INTRAVENOUS
  Filled 2018-08-13: qty 1

## 2018-08-13 MED ORDER — FUROSEMIDE 10 MG/ML IJ SOLN
40.0000 mg | Freq: Two times a day (BID) | INTRAMUSCULAR | Status: DC
  Administered 2018-08-13 – 2018-08-14 (×3): 40 mg via INTRAVENOUS
  Filled 2018-08-13 (×3): qty 4

## 2018-08-13 MED ORDER — ENOXAPARIN SODIUM 40 MG/0.4ML ~~LOC~~ SOLN
40.0000 mg | SUBCUTANEOUS | Status: DC
  Filled 2018-08-13: qty 0.4

## 2018-08-13 MED ORDER — GABAPENTIN 300 MG PO CAPS
900.0000 mg | ORAL_CAPSULE | Freq: Three times a day (TID) | ORAL | Status: DC
  Administered 2018-08-13 – 2018-08-15 (×7): 900 mg via ORAL
  Filled 2018-08-13 (×10): qty 3

## 2018-08-13 MED ORDER — POTASSIUM CHLORIDE CRYS ER 20 MEQ PO TBCR
40.0000 meq | EXTENDED_RELEASE_TABLET | ORAL | Status: AC
  Administered 2018-08-13 (×3): 40 meq via ORAL
  Filled 2018-08-13 (×3): qty 2

## 2018-08-13 MED ORDER — ENOXAPARIN SODIUM 40 MG/0.4ML ~~LOC~~ SOLN
40.0000 mg | Freq: Every day | SUBCUTANEOUS | Status: DC
  Administered 2018-08-13: 40 mg via SUBCUTANEOUS
  Filled 2018-08-13 (×3): qty 0.4

## 2018-08-13 MED ORDER — ACETAMINOPHEN 650 MG RE SUPP: 650.0000 mg | Freq: Four times a day (QID) | RECTAL | Status: DC | PRN

## 2018-08-13 MED ORDER — PROMETHAZINE HCL 25 MG PO TABS
12.5000 mg | ORAL_TABLET | Freq: Once | ORAL | Status: AC
  Administered 2018-08-13: 12.5 mg via ORAL
  Filled 2018-08-13: qty 1

## 2018-08-13 NOTE — ED Provider Notes (Signed)
Pt requesting more meds for her HA while awaiting admit    Ripley Fraise, MD 08/13/18 [phone]

## 2018-08-13 NOTE — Progress Notes (Signed)
Dr. Broadus John notified of patient's belligerent behavior towards staff. Offered patient scheduled meds at this time in addition to 12.5 of phenergan po as ordered for stated but unobserved emesis.  Patient's tray was noted to have had a hamburger, fries and Mt Dew all consumed.  Patient states she is unable to take her meds due to nausea and is requesting IV medications and pain meds.  Will relay to patient per Dr. Broadus John that her orders will stand as they are with no changes. No IV meds,No narcotics.

## 2018-08-13 NOTE — Progress Notes (Signed)
  Echocardiogram 2D Echocardiogram has been performed.  [name] 08/13/2018, 3:26 PM

## 2018-08-13 NOTE — ED Provider Notes (Signed)
Patient gives permission to perform history and physical in front of family/friend    Ripley Fraise, MD 08/13/18 0127

## 2018-08-13 NOTE — ED Notes (Signed)
Patient requests pain medication each time I enter the room after awaking; will notified MD re: request.

## 2018-08-13 NOTE — ED Notes (Signed)
Pt reports feeling nauseous and reports she is not able to take oral phenergan, pt encouraged to try to take medicine to assist with nausea, pt provided emesis bag, will continue to monitor

## 2018-08-13 NOTE — ED Notes (Signed)
Patient has voided x2

## 2018-08-13 NOTE — ED Notes (Signed)
Arrived to patient's room and she was crying; "I need something for pain, can you call the dr.?, Can I get some more phenergan? Are you leaving? Please don't leave", etc. Attempted to console patient - pt continued to ask be to get in touch with the dr.

## 2018-08-13 NOTE — Progress Notes (Signed)
2D Echocardiogram has been performed.  [name] 08/13/2018, 3:25 PM

## 2018-08-13 NOTE — H&P (Addendum)
History and Physical    [name] HAL:[id number] DOB: 30-Mar-1976 DOA: 08/12/2018  PCP: Patient, No Pcp Per  Patient coming from: Home.  Chief Complaint: Shortness of breath.  HPI: [name] is a 43 y.o. female with history of diabetes mellitus type 2, hypertension presents to the ER for the second time in last 5 days with complaints of shortness of breath.  Had come to the ER 4 days ago with lower extremity edema and was discharged home on Lasix.  At the time also patient had complained of some shortness of breath.  Patient was taking Lasix despite which patient has been getting more short of breath with minimal exertion denies any chest pain.  Has been having some headache and nausea.  Denies any focal deficits.  Has some nonproductive cough denies any wheezing.  ED Course: Chest x-ray shows haziness concerning for edema versus infiltrates.  WBC count is around 12.  Hemoglobin is at baseline around 8.  BNP is 182.  EKG shows sinus tachycardia.  Mild lower extremity edema.  Patient was given 40 mg IV Lasix for CHF and admitted for further management.  Review of Systems: As per HPI, rest all negative.   Past Medical History:  Diagnosis Date  . Abnormal Pap smear   . Anemia   . Anxiety   . Bipolar 1 disorder ([place])   . Depression   . Diabetes mellitus   . Diabetes mellitus without complication ([place])   . Gout   . Headache(784.0)   . Kidney calculi   . 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 Seasonal allergies   . Tongue cancer ([place])   . UTI (urinary tract infection)     Past Surgical History:  Procedure Laterality Date  . CESAREAN SECTION    . CHOLECYSTECTOMY    . ESOPHAGOGASTRODUODENOSCOPY N/A 09/07/2012   Procedure: ESOPHAGOGASTRODUODENOSCOPY (EGD);  Surgeon: Juanita Craver, MD;  Location: [facility] ENDOSCOPY;  Service: Endoscopy;  Laterality: N/A;     reports that she has been smoking cigarettes. She has a 12.50  pack-year smoking history. She has never used smokeless tobacco. She reports that she does not drink alcohol or use drugs.  Allergies  Allergen Reactions  . Reglan [Metoclopramide] Hives and Shortness Of Breath  . Bentyl [Dicyclomine Hcl] Hives  . Compazine [Prochlorperazine Edisylate] Hives  . Ketorolac Hives  . Morphine And Related Hives  . Zofran [Ondansetron Hcl] Hives and Nausea And Vomiting    Family History  Problem Relation Age of Onset  . Hypertension Mother   . Diabetes Mother   . Hypertension Maternal Aunt   . Diabetes Maternal Aunt   . Hypertension Maternal Uncle   . Diabetes Maternal Uncle   . Hypertension Maternal Grandmother     Prior to Admission medications   Medication Sig Start Date End Date Taking? Authorizing Provider  amitriptyline (ELAVIL) 150 MG tablet Take 150 mg by mouth at bedtime. 07/28/18  Yes [provider]  furosemide (LASIX) 20 MG tablet Take 1 tablet (20 mg total) by mouth daily. 08/11/18  Yes Delo, Nathaneil Canary, MD  gabapentin (NEURONTIN) 300 MG capsule Take 900 mg by mouth 3 (three) times daily.   Yes [provider]  glipiZIDE (GLUCOTROL) 10 MG tablet Take 20 mg by mouth daily. 07/25/18  Yes [provider]  ibuprofen (ADVIL,MOTRIN) 800 MG tablet Take 800 mg by mouth 3 (three) times daily as needed for mild pain.  07/25/18  Yes [provider]  insulin aspart (NOVOLOG FLEXPEN) 100 UNIT/ML FlexPen Inject 30 Units into the skin 2 (two) times daily with a meal. Patient taking differently: Inject 40 Units into the skin 2 (two) times daily with a meal.  08/11/18  Yes Delo, Nathaneil Canary, MD  Insulin Glargine (LANTUS SOLOSTAR) 100 UNIT/ML Solostar Pen Inject 40 Units into the skin at bedtime. For diabetes management Patient taking differently: Inject 100 Units into the skin at bedtime.  08/18/15  Yes Lindell Spar I, NP  lisinopril (PRINIVIL,ZESTRIL) 10 MG tablet Take 10 mg by mouth daily. 07/25/18  Yes [provider]    cyclobenzaprine (FLEXERIL) 10 MG tablet Take 1 tablet (10 mg total) by mouth 2 (two) times daily as needed for muscle spasms. Patient not taking: Reported on 08/13/2018 08/11/18   Veryl Speak, MD  FLUoxetine (PROZAC) 10 MG capsule Take 1 capsule (10 mg total) by mouth daily. For depression Patient not taking: Reported on 08/13/2018 08/18/15   Lindell Spar I, NP  hydrOXYzine (ATARAX/VISTARIL) 25 MG tablet Take 1 tablet (25 mg total) by mouth every 6 (six) hours as needed for anxiety. Patient not taking: Reported on 08/13/2018 08/18/15   Lindell Spar I, NP  metFORMIN (GLUCOPHAGE) 1000 MG tablet Take 1 tablet (1,000 mg total) by mouth 2 (two) times daily with a meal. For diabetes management Patient not taking: Reported on 08/13/2018 08/18/15   Lindell Spar I, NP  nicotine (NICODERM CQ - DOSED IN MG/24 HOURS) 21 mg/24hr patch Place 1 patch (21 mg total) onto the skin daily. For smoking cessation Patient not taking: Reported on 08/13/2018 08/18/15   Lindell Spar I, NP  traZODone (DESYREL) 100 MG tablet Take 1 tablet (100 mg total) by mouth at bedtime as needed for sleep. Patient not taking: Reported on 08/13/2018 08/18/15   Lindell Spar I, NP    Physical Exam: Vitals:   08/12/18 2330 08/12/18 2345 08/13/18 0000 08/13/18 0018  BP: (!) 150/65 (!) 131/97 (!) 152/97   Pulse: (!) 111 (!) 119 (!) 127   Resp: (!) 27 (!) 35 (!) 31   Temp:      TempSrc:      SpO2: 94% 96% 93%   Weight:    98.6 kg      Constitutional: Moderately built and nourished. Vitals:   08/12/18 2330 08/12/18 2345 08/13/18 0000 08/13/18 0018  BP: (!) 150/65 (!) 131/97 (!) 152/97   Pulse: (!) 111 (!) 119 (!) 127   Resp: (!) 27 (!) 35 (!) 31   Temp:      TempSrc:      SpO2: 94% 96% 93%   Weight:    98.6 kg   Eyes: Anicteric no pallor. ENMT: No discharge from the ears eyes nose and mouth. Neck: No mass felt.  No neck rigidity. Respiratory: No rhonchi or crepitations. Cardiovascular: S1-S2 heard. Abdomen: Soft nontender bowel  sounds present. Musculoskeletal: Mild edema of the both lower extremity. Skin: No rash. Neurologic: Alert awake oriented to time place and person.  Moves all extremities. Psychiatric: Appears normal.  Normal affect.   Labs on Admission: I have personally reviewed following labs and imaging studies  CBC: Recent Labs  Lab 08/10/18 2132 08/12/18 1943  WBC 13.7* 12.6*  HGB 7.6* 8.1*  HCT 27.3* 28.1*  MCV 59.1* 57.3*  PLT 298 664   Basic Metabolic Panel: Recent Labs  Lab 08/10/18 2132 08/12/18 1943  NA 135 138  K 3.5 3.2*  CL 105 104  CO2 20* 21*  GLUCOSE 128* 155*  BUN 11 6  CREATININE 0.96 0.61  CALCIUM 8.5* 8.4*   GFR: CrCl cannot be calculated (Unknown ideal weight.). Liver Function Tests: Recent Labs  Lab 08/10/18 2132 08/12/18 1943  AST 18 27  ALT 14 16  ALKPHOS 63 76  BILITOT 0.4 0.7  PROT 7.0 7.1  ALBUMIN 3.1* 2.9*   Recent Labs  Lab 08/12/18 1943  LIPASE 17   No results for input(s): AMMONIA in the last 168 hours. Coagulation Profile: No results for input(s): INR, PROTIME in the last 168 hours. Cardiac Enzymes: Recent Labs  Lab 08/11/18 0013  TROPONINI <0.03   BNP (last 3 results) No results for input(s): PROBNP in the last 8760 hours. HbA1C: No results for input(s): HGBA1C in the last 72 hours. CBG: No results for input(s): GLUCAP in the last 168 hours. Lipid Profile: No results for input(s): CHOL, HDL, LDLCALC, TRIG, CHOLHDL, LDLDIRECT in the last 72 hours. Thyroid Function Tests: No results for input(s): TSH, T4TOTAL, FREET4, T3FREE, THYROIDAB in the last 72 hours. Anemia Panel: No results for input(s): VITAMINB12, FOLATE, FERRITIN, TIBC, IRON, RETICCTPCT in the last 72 hours. Urine analysis:    Component Value Date/Time   COLORURINE YELLOW 08/12/2018 1934   APPEARANCEUR CLOUDY (A) 08/12/2018 1934   LABSPEC 1.025 08/12/2018 1934   PHURINE 6.0 08/12/2018 1934   GLUCOSEU NEGATIVE 08/12/2018 1934   HGBUR NEGATIVE 08/12/2018 1934    BILIRUBINUR NEGATIVE 08/12/2018 1934   BILIRUBINUR neg 07/04/2012 1509   KETONESUR 5 (A) 08/12/2018 1934   PROTEINUR 30 (A) 08/12/2018 1934   UROBILINOGEN 0.2 12/15/2014 0428   NITRITE NEGATIVE 08/12/2018 1934   LEUKOCYTESUR SMALL (A) 08/12/2018 1934   Sepsis Labs: @LABRCNTIP (procalcitonin:4,lacticidven:4) )No results found for this or any previous visit (from the past 240 hour(s)).   Radiological Exams on Admission: Dg Chest 2 View  Result Date: 08/12/2018 CLINICAL DATA:  Shortness of breath EXAM: CHEST - 2 VIEW COMPARISON:  08/10/2018 FINDINGS: Moderate-to-marked bilateral ground-glass opacity and consolidations, progressed since prior radiograph. No pleural effusion. Stable borderline to mild cardiomegaly. No pneumothorax. IMPRESSION: Moderate bilateral ground-glass opacity and consolidations which may reflect edema or bilateral pneumonia. Borderline to mild cardiomegaly. Findings are progressed since 08/10/2018 Electronically Signed   By: Donavan Foil M.D.   On: 08/12/2018 20:01    EKG: Independently reviewed.  Sinus tachycardia.  Assessment/Plan Principal Problem:   Acute CHF (congestive heart failure) (HCC) Active Problems:   Microcytic anemia   HTN (hypertension)   Diabetes mellitus, new onset ([place])    1. Exertional dyspnea likely from acute CHF for which patient has been placed on Lasix 40 mg IV every 12.  Was placed on Nitropatch by the ER physician.  Check 2D echo patient is already on lisinopril.  Closely follow intake output metabolic panel.  Check Dopplers of the lower extremity.  Per radiology report differentials include pneumonia but no definite signs of any pneumonia at this time.  Check procalcitonin. 2. Chronic microcytic anemia.  Follow CBC check anemia panel. 3. Hypertension on lisinopril.  Follow metabolic panel since patient is on Lasix. 4. Diabetes mellitus type 2 on Lantus with sliding scale coverage. 5. Headache cause not clear appears nonfocal.  May have  to discontinue nitroglycerin patch if headache persists.  Check sed rate. 6. Chronic leukocytosis.   DVT prophylaxis: Lovenox. Code Status: Full code. Family Communication: Discussed with patient's husband. Disposition Plan: Home. Consults called: None. Admission status: Inpatient.   Rise Patience MD [facility] Pager [phone].  If 7PM-7AM, please contact night-coverage [email or web address] Password [facility]  08/13/2018, 12:53 AM

## 2018-08-13 NOTE — ED Notes (Signed)
Arrived to room to find patient in tears - inconsolable.

## 2018-08-13 NOTE — Progress Notes (Signed)
Inpatient Diabetes Program Recommendations  AACE/ADA: New Consensus Statement on Inpatient Glycemic Control (2015)  Target Ranges:  Prepandial:   less than 140 mg/dL      Peak postprandial:   less than 180 mg/dL (1-2 hours)      Critically ill patients:  140 - 180 mg/dL   Results for [name], [name] (MRN [id number]) as of 08/13/2018 13:57  Ref. Range 08/13/2018 08:07 08/13/2018 09:03 08/13/2018 12:26  Glucose-Capillary Latest Ref Range: 70 - 99 mg/dL 87  100 units LANTUS given at 1:45am in the ED 80 79    Admit with: SOB/ Acute CHF  History: DM  Home DM Meds: Lantus 100 units QHS       Novolog 40 units BID with meals       Glipizide 20 mg daily       Metformin 1000 mg BID (NOT taking)  Current Orders: Lantus 60 units QHS      Novolog Sensitive Correction Scale/ SSI (0-9 units) TID AC      Lantus 100 units given at 1:45am today.  Note that dose reduced to 60 units QHS for tonight.  Agree with reduction of dose for tonight since CBG this AM was only 87 mg/dl.  Will follow and assist daily if desired by medical team.     --Will follow patient during hospitalization--  Wyn Quaker RN, MSN, CDE Diabetes Coordinator Inpatient Glycemic Control Team Team Pager: [phone] (8a-5p)

## 2018-08-13 NOTE — ED Notes (Signed)
Pt tearful, declines having type and screen drawn now, pt encouraged to remain calm and we can reassess nausea level after PO meds had a chance to work, pt agrees and verbalizes understanding

## 2018-08-13 NOTE — ED Notes (Signed)
Attending MD returned call and will not give any medication other than Tylenol for pain, pt not to receive short acting insulin at this time but will receive Lantus at bedtime and sliding scale per verbal communication

## 2018-08-13 NOTE — ED Notes (Signed)
Patient requested pain meds again; advised Tylenol is all that is ordered and I will bring that

## 2018-08-13 NOTE — ED Notes (Signed)
Breakfast Tray Ordered. 

## 2018-08-13 NOTE — Progress Notes (Signed)
PROGRESS NOTE    [name]  XBM:[id number] DOB: Sep 29, 1975 DOA: 08/12/2018 PCP: Patient, No Pcp Per  Brief Narrative: Patient seen and examined, admitted earlier this morning by Dr. Hal Hope at this is a 43 year old female with history of type 2 diabetes mellitus mellitus, hypertension, history of narcotic abuse/addiction, anxiety, bipolar disorder, depression, she had been admitted to numerous hospitals in [place] namely [place] and [place] with false presentation of sickle cell crisis requesting IV pain medicines until it was subsequently determined that she did not carry of diagnosis of sickle cell disease. -Was seen in the emergency room treated 2 days ago with leg swelling, discharged home on Lasix and asked to follow-up with PCP -Reports taking this but despite that developed dyspnea on exertion and subsequently came to the emergency room. -For cardiomegaly and interstitial edema/CHF, in addition was noted to have severe iron deficiency anemia  Assessment & Plan:   Acute CHF -Possibly diastolic, 2D echocardiogram is pending -IV Lasix 40 mg every 12 -Clinically do not suspect pneumonia, monitor without antibiotics, procalcitonin is less than 0.1  Severe microcytic anemia -Due to iron deficiency and unspecified thalassemia per chart review from outside hospital -Will transfuse 1 unit of PRBC due to ongoing CHF, also give IV iron tomorrow -Patient admits that she is supposed to be on iron supplementation chronically however stopped taking this several months ago, also has a long history of menorrhagia -Advised follow-up with GYN to evaluate for fibroids  Diabetes mellitus -On an unusual regimen of Lantus 100 units daily and 30 units of NovoLog 3 times daily -CBG is in the 80s to 100 range now -Resumed Lantus, discontinue high-dose NovoLog, add sliding scale  Chronic pain/narcotic dependence -Long history of narcotic seeking behavior, malingering -Completed extensive chart  review she had been hospitalized on numerous occasions in the past stating sickle cell crisis until it was finally determined that she did not have sickle cell disease -Will not order narcotics for chronic pain, also reports that she is allergic to all antiemetics except Phenergan, constantly requesting this along with pain medicines -Add oral Phenergan -Managing expectations will continue to be a challenge -Resumed gabapentin -Tylenol PRN  DVT prophylaxis: lovenox Code Status: Full Code Family Communication: None at bedside Disposition Plan: Home penidng above workup and management  Consultants:   None   Procedures:   Antimicrobials:    Subjective: -Complains of headache, pain all over, mild dyspnea with activity  Objective: Vitals:   08/13/18 0925 08/13/18 1036 08/13/18 1037 08/13/18 1312  BP: (!) 126/59 124/67 124/67 127/61  Pulse: (!) 102  (!) 101 (!) 104  Resp: 18  14 18   Temp:      TempSrc:      SpO2:      Weight:       No intake or output data in the 24 hours ending 08/13/18 1338 Filed Weights   08/13/18 0018  Weight: 98.6 kg    Examination:  General exam: Appears calm and comfortable, no distress, Respiratory system: Few basilar crackles Cardiovascular system: S1 & S2 heard, RRR. No JVD, murmurs, rubs, gallops Gastrointestinal system: Abdomen is nondistended, soft and nontender.Normal bowel sounds heard. Central nervous system: Alert and oriented. No focal neurological deficits. Extremities: 1+ edema Skin: No rashes, lesions or ulcers Psychiatry: Judgement and insight appear normal. Mood & affect appropriate.     Data Reviewed:   CBC: Recent Labs  Lab 08/10/18 2132 08/12/18 1943 08/13/18 0444  WBC 13.7* 12.6* 12.8*  HGB 7.6* 8.1* 7.6*  HCT 27.3* 28.1* 26.0*  MCV 59.1* 57.3* 56.6*  PLT 298 350 756   Basic Metabolic Panel: Recent Labs  Lab 08/10/18 2132 08/12/18 1943 08/13/18 0444  NA 135 138 140  K 3.5 3.2* 2.8*  CL 105 104 107  CO2  20* 21* 24  GLUCOSE 128* 155* 108*  BUN 11 6 7   CREATININE 0.96 0.61 0.61  CALCIUM 8.5* 8.4* 8.0*  MG  --   --  1.5*   GFR: CrCl cannot be calculated (Unknown ideal weight.). Liver Function Tests: Recent Labs  Lab 08/10/18 2132 08/12/18 1943  AST 18 27  ALT 14 16  ALKPHOS 63 76  BILITOT 0.4 0.7  PROT 7.0 7.1  ALBUMIN 3.1* 2.9*   Recent Labs  Lab 08/12/18 1943  LIPASE 17   No results for input(s): AMMONIA in the last 168 hours. Coagulation Profile: No results for input(s): INR, PROTIME in the last 168 hours. Cardiac Enzymes: Recent Labs  Lab 08/11/18 0013 08/13/18 0444  TROPONINI <0.03 <0.03   BNP (last 3 results) No results for input(s): PROBNP in the last 8760 hours. HbA1C: No results for input(s): HGBA1C in the last 72 hours. CBG: Recent Labs  Lab 08/13/18 0807 08/13/18 0903 08/13/18 1226  GLUCAP 87 80 79   Lipid Profile: No results for input(s): CHOL, HDL, LDLCALC, TRIG, CHOLHDL, LDLDIRECT in the last 72 hours. Thyroid Function Tests: Recent Labs    08/13/18 0444  TSH 1.882   Anemia Panel: Recent Labs    08/13/18 0835  VITAMINB12 515  FOLATE 8.2  FERRITIN 23  TIBC 312  IRON 12*  RETICCTPCT 1.0   Urine analysis:    Component Value Date/Time   COLORURINE YELLOW 08/12/2018 1934   APPEARANCEUR CLOUDY (A) 08/12/2018 1934   LABSPEC 1.025 08/12/2018 1934   PHURINE 6.0 08/12/2018 1934   GLUCOSEU NEGATIVE 08/12/2018 1934   HGBUR NEGATIVE 08/12/2018 1934   BILIRUBINUR NEGATIVE 08/12/2018 1934   BILIRUBINUR neg 07/04/2012 1509   KETONESUR 5 (A) 08/12/2018 1934   PROTEINUR 30 (A) 08/12/2018 1934   UROBILINOGEN 0.2 12/15/2014 0428   NITRITE NEGATIVE 08/12/2018 1934   LEUKOCYTESUR SMALL (A) 08/12/2018 1934   Sepsis Labs: @LABRCNTIP (procalcitonin:4,lacticidven:4)  )No results found for this or any previous visit (from the past 240 hour(s)).       Radiology Studies: Dg Chest 2 View  Result Date: 08/12/2018 CLINICAL DATA:  Shortness of  breath EXAM: CHEST - 2 VIEW COMPARISON:  08/10/2018 FINDINGS: Moderate-to-marked bilateral ground-glass opacity and consolidations, progressed since prior radiograph. No pleural effusion. Stable borderline to mild cardiomegaly. No pneumothorax. IMPRESSION: Moderate bilateral ground-glass opacity and consolidations which may reflect edema or bilateral pneumonia. Borderline to mild cardiomegaly. Findings are progressed since 08/10/2018 Electronically Signed   By: Donavan Foil M.D.   On: 08/12/2018 20:01        Scheduled Meds: . sodium chloride   Intravenous Once  . amitriptyline  150 mg Oral QHS  . enoxaparin (LOVENOX) injection  40 mg Subcutaneous QHS  . furosemide  40 mg Intravenous Q12H  . gabapentin  900 mg Oral TID  . insulin aspart  0-9 Units Subcutaneous TID WC  . insulin glargine  100 Units Subcutaneous QHS  . lisinopril  10 mg Oral Daily  . potassium chloride  40 mEq Oral Q2H   Continuous Infusions:   LOS: 0 days    Time spent: 11min    Domenic Polite, MD [facility]  08/13/2018, 1:38 PM

## 2018-08-13 NOTE — ED Notes (Signed)
Patient requesting pain medication again, Md notified.

## 2018-08-13 NOTE — ED Notes (Signed)
Attending paged re: pts request for phenergan

## 2018-08-14 DIAGNOSIS — G4489 Other headache syndrome: Secondary | ICD-10-CM

## 2018-08-14 DIAGNOSIS — Z765 Malingerer [conscious simulation]: Secondary | ICD-10-CM

## 2018-08-14 DIAGNOSIS — D509 Iron deficiency anemia, unspecified: Secondary | ICD-10-CM

## 2018-08-14 LAB — BASIC METABOLIC PANEL
Anion gap: 13 (ref 5–15)
BUN: 7 mg/dL (ref 6–20)
CO2: 21 mmol/L — ABNORMAL LOW (ref 22–32)
Calcium: 8.6 mg/dL — ABNORMAL LOW (ref 8.9–10.3)
Chloride: 102 mmol/L (ref 98–111)
Creatinine, Ser: 0.7 mg/dL (ref 0.44–1.00)
GFR calc Af Amer: >60 mL/min (ref >60)
Glucose, Bld: 230 mg/dL — ABNORMAL HIGH (ref 70–99)
Potassium: 3.7 mmol/L (ref 3.5–5.1)
Sodium: 136 mmol/L (ref 135–145)

## 2018-08-14 LAB — BPAM RBC
Blood Product Expiration Date: 202003012359
ISSUE DATE / TIME: 202002191849
UNIT TYPE AND RH: 7300

## 2018-08-14 LAB — TYPE AND SCREEN
ABO/RH(D): B POS
Antibody Screen: NEGATIVE
Unit division: 0

## 2018-08-14 LAB — CBC
HCT: 30.2 % — ABNORMAL LOW (ref 36.0–46.0)
Hemoglobin: 9.1 g/dL — ABNORMAL LOW (ref 12.0–15.0)
MCH: 17.6 pg — ABNORMAL LOW (ref 26.0–34.0)
MCHC: 30.1 g/dL (ref 30.0–36.0)
MCV: 58.5 fL — ABNORMAL LOW (ref 80.0–100.0)
NRBC: 0.3 % — AB (ref 0.0–0.2)
Platelets: 339 10*3/uL (ref 150–400)
RBC: 5.16 MIL/uL — ABNORMAL HIGH (ref 3.87–5.11)
RDW: 23.9 % — ABNORMAL HIGH (ref 11.5–15.5)
WBC: 14 10*3/uL — ABNORMAL HIGH (ref 4.0–10.5)

## 2018-08-14 LAB — GLUCOSE, CAPILLARY
Glucose-Capillary: 177 mg/dL — ABNORMAL HIGH (ref 70–99)
Glucose-Capillary: 280 mg/dL — ABNORMAL HIGH (ref 70–99)
Glucose-Capillary: 215 mg/dL — ABNORMAL HIGH (ref 70–99)
Glucose-Capillary: 206 mg/dL — ABNORMAL HIGH (ref 70–99)

## 2018-08-14 MED ORDER — SODIUM CHLORIDE 0.9 % IV SOLN
510.0000 mg | Freq: Once | INTRAVENOUS | Status: AC
  Administered 2018-08-14: 510 mg via INTRAVENOUS
  Filled 2018-08-14: qty 17

## 2018-08-14 MED ORDER — METOPROLOL TARTRATE 25 MG PO TABS
25.0000 mg | ORAL_TABLET | Freq: Two times a day (BID) | ORAL | Status: DC
  Administered 2018-08-14 – 2018-08-15 (×3): 25 mg via ORAL
  Filled 2018-08-14 (×3): qty 1

## 2018-08-14 MED ORDER — FUROSEMIDE 10 MG/ML IJ SOLN
40.0000 mg | Freq: Two times a day (BID) | INTRAMUSCULAR | Status: AC
  Administered 2018-08-14 – 2018-08-15 (×2): 40 mg via INTRAVENOUS
  Filled 2018-08-14 (×2): qty 4

## 2018-08-14 MED ORDER — TRAMADOL HCL 50 MG PO TABS
50.0000 mg | ORAL_TABLET | Freq: Two times a day (BID) | ORAL | Status: DC | PRN
  Administered 2018-08-14 (×2): 50 mg via ORAL
  Filled 2018-08-14 (×2): qty 1

## 2018-08-14 MED ORDER — SODIUM CHLORIDE 0.9 % IV BOLUS
250.0000 mL | Freq: Once | INTRAVENOUS | Status: AC
  Administered 2018-08-14: 250 mL via INTRAVENOUS

## 2018-08-14 MED ORDER — INSULIN ASPART 100 UNIT/ML ~~LOC~~ SOLN
5.0000 [IU] | Freq: Three times a day (TID) | SUBCUTANEOUS | Status: DC
  Administered 2018-08-14 – 2018-08-15 (×2): 5 [IU] via SUBCUTANEOUS

## 2018-08-14 MED ORDER — POTASSIUM CHLORIDE CRYS ER 20 MEQ PO TBCR
40.0000 meq | EXTENDED_RELEASE_TABLET | Freq: Two times a day (BID) | ORAL | Status: AC
  Administered 2018-08-14 – 2018-08-15 (×3): 40 meq via ORAL
  Filled 2018-08-14 (×3): qty 2

## 2018-08-14 NOTE — Progress Notes (Signed)
Inpatient Diabetes Program Recommendations  AACE/ADA: New Consensus Statement on Inpatient Glycemic Control (2015)  Target Ranges:  Prepandial:   less than 140 mg/dL      Peak postprandial:   less than 180 mg/dL (1-2 hours)      Critically ill patients:  140 - 180 mg/dL   Lab Results  Component Value Date   GLUCAP 280 (H) 08/14/2018   HGBA1C 12.9 (H) 08/17/2015    Review of Glycemic Control Results for [name], HOPF (MRN [id number]) as of 08/14/2018 13:56  Ref. Range 08/13/2018 12:26 08/13/2018 16:56 08/13/2018 21:57 08/14/2018 08:37 08/14/2018 11:21  Glucose-Capillary Latest Ref Range: 70 - 99 mg/dL 79 124 (H) 211 (H) 177 (H) 280 (H)   Diabetes history: DM 2 Outpatient Diabetes medications: Lantus 100 units q HS, Novolog 40 units bid, Glucotrol 20 mg daily Current orders for Inpatient glycemic control:  Lantus 60 units q HS, Novolog 0-9 units tid with meals Inpatient Diabetes Program Recommendations:   May consider adding Novolog meal coverage 5 units tid with meals (hold if patient eats less than 50%).   Thanks,  Adah Perl, RN, BC-ADM Inpatient Diabetes Coordinator Pager [phone] (8a-5p)

## 2018-08-14 NOTE — Progress Notes (Signed)
Pt's HR is now back in the 120s. [facility] was paged and stated that pt is stable right now and to page if pt's HR reach over 130. Will continue to monitor.

## 2018-08-14 NOTE — Progress Notes (Signed)
Pt's HR was still sustaining in the 120s after receiving blood and pt's temp is 100.1. [facility] was paged and gave a verbal order for 250 ml bolus of NS and to give a dose of Tylenol. Will continue to monitor.

## 2018-08-14 NOTE — Progress Notes (Addendum)
Pt's HR was sustaining in the 120s-130s and pt was vomiting after starting blood. [facility] was paged and HR came down to the 120s. MD stated to watch the HR and page after the blood is finished if HR is still elevated and placed an order for one time dose of phenergan oral. Will continue to monitor.

## 2018-08-14 NOTE — Progress Notes (Signed)
PROGRESS NOTE    [name]  VOJ:[id number] DOB: 1976-06-14 DOA: 08/12/2018 PCP: Patient, No Pcp Per  Brief Narrative: Patient seen and examined, admitted earlier this morning by Dr. Hal Hope at this is a 43 year old female with history of type 2 diabetes mellitus mellitus, hypertension, history of narcotic abuse/addiction, anxiety, bipolar disorder, depression, she had been admitted to numerous hospitals in [place] namely [place] and [place] with false presentation of sickle cell crisis requesting IV pain medicines until it was subsequently determined that she did not carry of diagnosis of sickle cell disease. -Was seen in the emergency room treated 2 days ago with leg swelling, discharged home on Lasix and asked to follow-up with PCP -Reports taking this but despite that developed dyspnea on exertion and subsequently came to the emergency room. -For cardiomegaly and interstitial edema/CHF, in addition was noted to have severe iron deficiency anemia  Assessment & Plan:   Volume overload -I think this is high-output heart failure from worsening anemia -Echocardiogram notes normal EF, no diastolic dysfunction -Continue IV Lasix 1 more day -At discharge, will prescribe 20 mg daily Lasix as needed -Also do not suspect pneumonia at this time, monitor without antibiotics procalcitonin less than 0.1  Severe microcytic anemia -Due to iron deficiency and unspecified thalassemia per chart review from outside hospital -Transfused 1 unit of PRBC yesterday, will give IV iron today -Long history of menorrhagia, advised GYN follow-up -Supposed to be on iron supplementation chronically however stopped this several months ago  Diabetes mellitus -uncontrolled type 2 diabetes mellitus on a very unusual regimen of Lantus and high-dose NovoLog 3 times daily -Increase Lantus dose, diabetes coordinator consulted -Needs a PCP, case management consult  Chronic pain/narcotic dependence -Long history  of narcotic seeking behavior, malingering -Completed extensive chart review in [place] she had been hospitalized on numerous occasions in the past at Outside Hospitals stating sickle cell crisis until it was finally determined that she did not have sickle cell disease -Will not order narcotics for chronic pain, also reports that she is allergic to all antiemetics except Phenergan, constantly requesting this along with pain medicines -Continue oral Phenergan, add tramadol as needed, gabapentin restarted -Tylenol as needed -Continue to reiterate this with patient on numerous occasions that there is no indication for IV narcotics at this time  DVT prophylaxis: lovenox Code Status: Full Code Family Communication: None at bedside Disposition Plan: Home tomorrow if stable  Consultants:   None   Procedures:   Antimicrobials:    Subjective: -Complains of nausea, 1 episode of vomiting yesterday, breathing is improving, leg swelling significantly better  Objective: Vitals:   08/13/18 2337 08/14/18 0048 08/14/18 0558 08/14/18 0938  BP: 132/89 (!) 160/84 124/66 (!) 128/113  Pulse: (!) 115 (!) 122 (!) 125 (!) 125  Resp: 18 20 18    Temp: 100.1 F (37.8 C) 99.3 F (37.4 C) 98 F (36.7 C) 98.5 F (36.9 C)  TempSrc: Oral Oral Oral Oral  SpO2: 93% 100% 91%   Weight:   92.8 kg   Height:        Intake/Output Summary (Last 24 hours) at 08/14/2018 1119 Last data filed at 08/13/2018 1500 Gross per 24 hour  Intake 150 ml  Output -  Net 150 ml   Filed Weights   08/13/18 0018 08/13/18 1326 08/14/18 0558  Weight: 98.6 kg 95.3 kg 92.8 kg    Examination:  Gen: Awake, Alert, Oriented X 3, no distress HEENT: PERRLA, Neck supple, no JVD Lungs: Decreased breath sounds at  both bases, rest clear CVS: RRR,No Gallops,Rubs or new Murmurs Abd: soft, Non tender, non distended, BS present Extremities: Trace edema Skin: no new rashes Psychiatry: Judgement and insight appear normal. Mood &  affect appropriate.     Data Reviewed:   CBC: Recent Labs  Lab 08/10/18 2132 08/12/18 1943 08/13/18 0444 08/14/18 0349  WBC 13.7* 12.6* 12.8* 14.0*  HGB 7.6* 8.1* 7.6* 9.1*  HCT 27.3* 28.1* 26.0* 30.2*  MCV 59.1* 57.3* 56.6* 58.5*  PLT 298 350 306 967   Basic Metabolic Panel: Recent Labs  Lab 08/10/18 2132 08/12/18 1943 08/13/18 0444 08/14/18 0349  NA 135 138 140 136  K 3.5 3.2* 2.8* 3.7  CL 105 104 107 102  CO2 20* 21* 24 21*  GLUCOSE 128* 155* 108* 230*  BUN 11 6 7 7   CREATININE 0.96 0.61 0.61 0.70  CALCIUM 8.5* 8.4* 8.0* 8.6*  MG  --   --  1.5*  --    GFR: Estimated Creatinine Clearance: 104.1 mL/min (by C-G formula based on SCr of 0.7 mg/dL). Liver Function Tests: Recent Labs  Lab 08/10/18 2132 08/12/18 1943  AST 18 27  ALT 14 16  ALKPHOS 63 76  BILITOT 0.4 0.7  PROT 7.0 7.1  ALBUMIN 3.1* 2.9*   Recent Labs  Lab 08/12/18 1943  LIPASE 17   No results for input(s): AMMONIA in the last 168 hours. Coagulation Profile: No results for input(s): INR, PROTIME in the last 168 hours. Cardiac Enzymes: Recent Labs  Lab 08/11/18 0013 08/13/18 0444  TROPONINI <0.03 <0.03   BNP (last 3 results) No results for input(s): PROBNP in the last 8760 hours. HbA1C: No results for input(s): HGBA1C in the last 72 hours. CBG: Recent Labs  Lab 08/13/18 0903 08/13/18 1226 08/13/18 1656 08/13/18 2157 08/14/18 0837  GLUCAP 80 79 124* 211* 177*   Lipid Profile: No results for input(s): CHOL, HDL, LDLCALC, TRIG, CHOLHDL, LDLDIRECT in the last 72 hours. Thyroid Function Tests: Recent Labs    08/13/18 0444  TSH 1.882   Anemia Panel: Recent Labs    08/13/18 0835  VITAMINB12 515  FOLATE 8.2  FERRITIN 23  TIBC 312  IRON 12*  RETICCTPCT 1.0   Urine analysis:    Component Value Date/Time   COLORURINE YELLOW 08/12/2018 1934   APPEARANCEUR CLOUDY (A) 08/12/2018 1934   LABSPEC 1.025 08/12/2018 1934   PHURINE 6.0 08/12/2018 1934   GLUCOSEU NEGATIVE  08/12/2018 1934   HGBUR NEGATIVE 08/12/2018 1934   BILIRUBINUR NEGATIVE 08/12/2018 1934   BILIRUBINUR neg 07/04/2012 1509   KETONESUR 5 (A) 08/12/2018 1934   PROTEINUR 30 (A) 08/12/2018 1934   UROBILINOGEN 0.2 12/15/2014 0428   NITRITE NEGATIVE 08/12/2018 1934   LEUKOCYTESUR SMALL (A) 08/12/2018 1934   Sepsis Labs: @LABRCNTIP (procalcitonin:4,lacticidven:4)  )No results found for this or any previous visit (from the past 240 hour(s)).       Radiology Studies: Dg Chest 2 View  Result Date: 08/12/2018 CLINICAL DATA:  Shortness of breath EXAM: CHEST - 2 VIEW COMPARISON:  08/10/2018 FINDINGS: Moderate-to-marked bilateral ground-glass opacity and consolidations, progressed since prior radiograph. No pleural effusion. Stable borderline to mild cardiomegaly. No pneumothorax. IMPRESSION: Moderate bilateral ground-glass opacity and consolidations which may reflect edema or bilateral pneumonia. Borderline to mild cardiomegaly. Findings are progressed since 08/10/2018 Electronically Signed   By: Donavan Foil M.D.   On: 08/12/2018 20:01        Scheduled Meds: . sodium chloride   Intravenous Once  . amitriptyline  150 mg Oral  QHS  . enoxaparin (LOVENOX) injection  40 mg Subcutaneous QHS  . furosemide  40 mg Intravenous Q12H  . gabapentin  900 mg Oral TID  . insulin aspart  0-9 Units Subcutaneous TID WC  . insulin glargine  60 Units Subcutaneous QHS  . lisinopril  10 mg Oral Daily  . metoprolol tartrate  25 mg Oral BID  . potassium chloride  40 mEq Oral BID   Continuous Infusions: . ferumoxytol       LOS: 1 day    Time spent: 25min    Domenic Polite, MD [facility]  08/14/2018, 11:19 AM

## 2018-08-15 DIAGNOSIS — G4489 Other headache syndrome: Secondary | ICD-10-CM

## 2018-08-15 DIAGNOSIS — I5021 Acute systolic (congestive) heart failure: Secondary | ICD-10-CM

## 2018-08-15 DIAGNOSIS — D649 Anemia, unspecified: Secondary | ICD-10-CM

## 2018-08-15 LAB — CBC
HCT: 31 % — ABNORMAL LOW (ref 36.0–46.0)
Hemoglobin: 9.4 g/dL — ABNORMAL LOW (ref 12.0–15.0)
MCH: 17.9 pg — ABNORMAL LOW (ref 26.0–34.0)
MCHC: 30.3 g/dL (ref 30.0–36.0)
MCV: 59 fL — ABNORMAL LOW (ref 80.0–100.0)
Platelets: 345 10*3/uL (ref 150–400)
RBC: 5.25 MIL/uL — ABNORMAL HIGH (ref 3.87–5.11)
RDW: 23.9 % — ABNORMAL HIGH (ref 11.5–15.5)
WBC: 14.6 10*3/uL — ABNORMAL HIGH (ref 4.0–10.5)
nRBC: 0.5 % — ABNORMAL HIGH (ref 0.0–0.2)

## 2018-08-15 LAB — BASIC METABOLIC PANEL
Anion gap: 13 (ref 5–15)
BUN: 9 mg/dL (ref 6–20)
CO2: 20 mmol/L — ABNORMAL LOW (ref 22–32)
Calcium: 8.9 mg/dL (ref 8.9–10.3)
Chloride: 103 mmol/L (ref 98–111)
Creatinine, Ser: 0.7 mg/dL (ref 0.44–1.00)
GFR calc Af Amer: >60 mL/min (ref >60)
GFR calc non Af Amer: >60 mL/min (ref >60)
Glucose, Bld: 271 mg/dL — ABNORMAL HIGH (ref 70–99)
Potassium: 3.8 mmol/L (ref 3.5–5.1)
Sodium: 136 mmol/L (ref 135–145)

## 2018-08-15 LAB — GLUCOSE, CAPILLARY: Glucose-Capillary: 263 mg/dL — ABNORMAL HIGH (ref 70–99)

## 2018-08-15 MED ORDER — FERROUS FUMARATE 325 (106 FE) MG PO TABS: 1.0000 | ORAL_TABLET | Freq: Two times a day (BID) | ORAL | 1 refills | Status: AC

## 2018-08-15 MED ORDER — FUROSEMIDE 20 MG PO TABS: 20.0000 mg | ORAL_TABLET | ORAL | 0 refills | Status: AC | PRN

## 2018-08-15 MED ORDER — INSULIN GLARGINE 100 UNIT/ML SOLOSTAR PEN: 80.0000 [IU] | PEN_INJECTOR | Freq: Every day | SUBCUTANEOUS | Status: AC

## 2018-08-15 MED ORDER — METOPROLOL TARTRATE 25 MG PO TABS: 12.5000 mg | ORAL_TABLET | Freq: Two times a day (BID) | ORAL | 0 refills | Status: AC

## 2018-08-15 NOTE — Discharge Summary (Signed)
Physician Discharge Summary  [name] LFY:[id number] DOB: 10-15-1975 DOA: 08/12/2018  PCP: Patient, No Pcp Per  Admit date: 08/12/2018 Discharge date: 08/15/2018  Time spent: 45 minutes  Recommendations for Outpatient Follow-up:  1. PCP Dr.McEnzie in 1week 2. Follow-up with gynecology for chronic menorrhagia causing severe iron deficiency anemia 3. Please monitor CBC, iron studies   Discharge Diagnoses:    Volume overload   Severe iron deficiency anemia   Unspecified thalassemia   DOES NOT HAVE not have sickle cell disease   Chronic pain syndrome   H/o malingering   Bipolar disorder   Depression   Anxiety   HTN (hypertension)   Diabetes mellitus, new onset (HCC)   Chronic anemia   Other headache syndrome   Discharge Condition: Stable  Diet recommendation: Low-sodium, diabetic  Filed Weights   08/13/18 1326 08/14/18 0558 08/15/18 0513  Weight: 95.3 kg 92.8 kg 90.9 kg    History of present illness:  43 year old female with history of type 2 diabetes mellitus mellitus, hypertension, history of narcotic abuse/addiction, anxiety, bipolar disorder, depression, she had been admitted to numerous hospitals in [place] namely [place] and [place] with false presentation of sickle cell crisis requesting IV pain medicines until it was subsequently determined that she did not carry of diagnosis of sickle cell disease. -Was seen in the emergency room treated 2 days ago with leg swelling, discharged home on Lasix and asked to follow-up with PCP -Reports taking this but despite that developed dyspnea on exertion and subsequently came to the emergency room. -CXR noted cardiomegaly and interstitial edema/CHF, in addition was noted to have severe iron deficiency anemia   Hospital Course:   Volume overload -I think this is high-output heart failure from worsening iron deficiency anemia -Improved with IV Lasix, blood transfusion and iron  -echocardiogram notes normal EF, no  diastolic dysfunction -Clinically euvolemic after diuresis with IV Lasix for 1 day -Hemoglobin improved to 9 range at discharge, prescribed oral iron, gynecology follow-up recommended  -Discharged home on Lasix 20 mg daily as needed  -Advised close follow-up with PCP in 1 week  Severe microcytic anemia -Due to iron deficiency and unspecified thalassemia per chart review from outside hospital -Transfused 1 unit of PRBC and given IV iron -Long history of menorrhagia, advised GYN follow-up -Supposed to be on iron supplementation chronically however stopped this several months ago -Started oral iron at discharge  Diabetes mellitus -uncontrolled type 2 diabetes mellitus on a very unusual regimen of Lantus and high-dose NovoLog 3 times daily -Increase Lantus dose, diabetes coordinator consulted -Needs a PCP, case management consult  Chronic pain/narcotic dependence -Long history of narcotic seeking behavior, malingering -Completed extensive chart review in [place] she had been hospitalized on numerous occasions in the past at Outside [facility] and [place] stating sickle cell crisis until it was finally determined that she did not have sickle cell disease -Patient was educated that we would not order narcotics for chronic pain, despite this continued to request IV narcotics on a regular basis and then started requesting IV Phenergan for nausea despite lack of objective evidence of vomiting per staff  -At this time she is tolerating her diet -We used Tylenol and tramadol as needed inpatient  -Continued to reiterate this with patient on numerous occasions that there is no indication for oral or IV narcotics at this time  Procedures:  Discharge Exam: Vitals:   08/14/18 1923 08/15/18 0513  BP: (!) 138/56 137/66  Pulse: (!) 103 92  Resp: 18  16  Temp: 98 F (36.7 C) 98.8 F (37.1 C)  SpO2: 100% 96%    General: Alert awake oriented x3 Cardiovascular: S1S2 regular  rate rhythm Respiratory: Clear bilaterally  Discharge Instructions    Allergies as of 08/15/2018      Reactions   Reglan [metoclopramide] Hives, Shortness Of Breath   Bentyl [dicyclomine Hcl] Hives   Compazine [prochlorperazine Edisylate] Hives   Ketorolac Hives   Morphine And Related Hives   Zofran [ondansetron Hcl] Hives, Nausea And Vomiting      Medication List    STOP taking these medications   cyclobenzaprine 10 MG tablet Commonly known as:  FLEXERIL   FLUoxetine 10 MG capsule Commonly known as:  PROZAC   hydrOXYzine 25 MG tablet Commonly known as:  ATARAX/VISTARIL   ibuprofen 800 MG tablet Commonly known as:  ADVIL,MOTRIN   lisinopril 10 MG tablet Commonly known as:  PRINIVIL,ZESTRIL   traZODone 100 MG tablet Commonly known as:  DESYREL     TAKE these medications   amitriptyline 150 MG tablet Commonly known as:  ELAVIL Take 150 mg by mouth at bedtime.   ferrous fumarate 325 (106 Fe) MG Tabs tablet Commonly known as:  HEMOCYTE - 106 mg FE Take 1 tablet (106 mg of iron total) by mouth 2 (two) times daily with a meal.   furosemide 20 MG tablet Commonly known as:  LASIX Take 1 tablet (20 mg total) by mouth as needed for fluid or edema. What changed:    when to take this  reasons to take this   gabapentin 300 MG capsule Commonly known as:  NEURONTIN Take 900 mg by mouth 3 (three) times daily.   glipiZIDE 10 MG tablet Commonly known as:  GLUCOTROL Take 20 mg by mouth daily.   insulin aspart 100 UNIT/ML FlexPen Commonly known as:  NOVOLOG FLEXPEN Inject 30 Units into the skin 2 (two) times daily with a meal. What changed:  how much to take   Insulin Glargine 100 UNIT/ML Solostar Pen Commonly known as:  LANTUS SOLOSTAR Inject 80 Units into the skin at bedtime. For diabetes management What changed:  how much to take   metFORMIN 1000 MG tablet Commonly known as:  GLUCOPHAGE Take 1 tablet (1,000 mg total) by mouth 2 (two) times daily with a meal.  For diabetes management   metoprolol tartrate 25 MG tablet Commonly known as:  LOPRESSOR Take 0.5 tablets (12.5 mg total) by mouth 2 (two) times daily.   nicotine 21 mg/24hr patch Commonly known as:  NICODERM CQ - dosed in mg/24 hours Place 1 patch (21 mg total) onto the skin daily. For smoking cessation      Allergies  Allergen Reactions  . Reglan [Metoclopramide] Hives and Shortness Of Breath  . Bentyl [Dicyclomine Hcl] Hives  . Compazine [Prochlorperazine Edisylate] Hives  . Ketorolac Hives  . Morphine And Related Hives  . Zofran [Ondansetron Hcl] Hives and Nausea And Vomiting   Follow-up Information    [facility]   Contact information: [address] [place]  [phone]           The results of significant diagnostics from this hospitalization (including imaging, microbiology, ancillary and laboratory) are listed below for reference.    Significant Diagnostic Studies: Dg Chest 2 View  Result Date: 08/12/2018 CLINICAL DATA:  Shortness of breath EXAM: CHEST - 2 VIEW COMPARISON:  08/10/2018 FINDINGS: Moderate-to-marked bilateral ground-glass opacity and consolidations, progressed since prior radiograph. No pleural effusion.  Stable borderline to mild cardiomegaly. No pneumothorax. IMPRESSION: Moderate bilateral ground-glass opacity and consolidations which may reflect edema or bilateral pneumonia. Borderline to mild cardiomegaly. Findings are progressed since 08/10/2018 Electronically Signed   By: Donavan Foil M.D.   On: 08/12/2018 20:01   Dg Chest 2 View  Result Date: 08/10/2018 CLINICAL DATA:  Bilateral lower extremity edema x3 days. History of diabetes and smoking. EXAM: CHEST - 2 VIEW COMPARISON:  04/27/2014 FINDINGS: Cardiomegaly with nonaneurysmal thoracic aorta is redemonstrated. Mild diffuse interstitial prominence, nonspecific in etiology with superimposed faint airspace opacities raise suspicion  for stigmata mild interstitial edema. Superimposed pneumonia be difficult to entirely exclude especially in the left upper and right lung base. No effusion or pneumothorax. No aggressive osseous lesions nor acute osseous appearing abnormality. Nodular densities over the hila and upper lung on the lateral view that may represent pulmonary vessel seen on end. Pulmonary nodules or masses are not apparent in two views favoring pulmonary vasculature. IMPRESSION: 1. Stable cardiomegaly with nonaneurysmal thoracic aorta. 2. Mild diffuse interstitial prominence, nonspecific in etiology with superimposed faint airspace opacities raise suspicion for stigmata of mild interstitial edema. Superimposed pneumonia be difficult to entirely exclude. Electronically Signed   By: Ashley Royalty M.D.   On: 08/10/2018 22:06    Microbiology: No results found for this or any previous visit (from the past 240 hour(s)).   Labs: Basic Metabolic Panel: Recent Labs  Lab 08/10/18 2132 08/12/18 1943 08/13/18 0444 08/14/18 0349 08/15/18 0502  NA 135 138 140 136 136  K 3.5 3.2* 2.8* 3.7 3.8  CL 105 104 107 102 103  CO2 20* 21* 24 21* 20*  GLUCOSE 128* 155* 108* 230* 271*  BUN 11 6 7 7 9   CREATININE 0.96 0.61 0.61 0.70 0.70  CALCIUM 8.5* 8.4* 8.0* 8.6* 8.9  MG  --   --  1.5*  --   --    Liver Function Tests: Recent Labs  Lab 08/10/18 2132 08/12/18 1943  AST 18 27  ALT 14 16  ALKPHOS 63 76  BILITOT 0.4 0.7  PROT 7.0 7.1  ALBUMIN 3.1* 2.9*   Recent Labs  Lab 08/12/18 1943  LIPASE 17   No results for input(s): AMMONIA in the last 168 hours. CBC: Recent Labs  Lab 08/10/18 2132 08/12/18 1943 08/13/18 0444 08/14/18 0349 08/15/18 0502  WBC 13.7* 12.6* 12.8* 14.0* 14.6*  HGB 7.6* 8.1* 7.6* 9.1* 9.4*  HCT 27.3* 28.1* 26.0* 30.2* 31.0*  MCV 59.1* 57.3* 56.6* 58.5* 59.0*  PLT 298 350 306 339 345   Cardiac Enzymes: Recent Labs  Lab 08/11/18 0013 08/13/18 0444  TROPONINI <0.03 <0.03   BNP: BNP (last 3  results) Recent Labs    08/11/18 0013 08/12/18 2334  BNP 165.6* 182.8*    ProBNP (last 3 results) No results for input(s): PROBNP in the last 8760 hours.  CBG: Recent Labs  Lab 08/14/18 0837 08/14/18 1121 08/14/18 1639 08/14/18 2112 08/15/18 0755  GLUCAP 177* 280* 215* 206* 263*       Signed:  Domenic Polite MD.  [facility] 08/15/2018, 2:24 PM

## 2018-08-15 NOTE — Care Management Note (Signed)
Case Management Note  Patient Details  Name: [name] MRN: [id number] Date of Birth: 05-21-1976  Subjective/Objective:    CHF               Action/Plan: CM talked to patient at the bedside; PCP was Dr Alyson Ingles; pt stated now she plans to see Dr Jimmye Norman on [address], CM offered to assist her with making a follow up apt but patient stated that she will make her own apt; has private insurance with Medicare; pharmacy of choice is [facility]. She is independent ambulating hallway without any difficulty.  Expected Discharge Date:  08/15/18               Expected Discharge Plan:  Home/Self Care  Discharge planning Services  CM Consult  Status of Service:  In process, will continue to follow  Sherrilyn Rist [phone] 08/15/2018, 10:10 AM

## 2018-09-18 ENCOUNTER — Other Ambulatory Visit: Payer: Self-pay

## 2018-09-18 ENCOUNTER — Encounter (HOSPITAL_COMMUNITY): Payer: Self-pay | Admitting: Emergency Medicine

## 2018-09-18 ENCOUNTER — Emergency Department (HOSPITAL_COMMUNITY)
Admission: EM | Admit: 2018-09-18 | Discharge: 2018-09-19 | Disposition: A | Discharge status: Home / Self Care | Payer: Medicare Other | Attending: Emergency Medicine | Admitting: Emergency Medicine

## 2018-09-18 DIAGNOSIS — E1165 Type 2 diabetes mellitus with hyperglycemia: Secondary | ICD-10-CM | POA: Insufficient documentation

## 2018-09-18 DIAGNOSIS — R350 Frequency of micturition: Secondary | ICD-10-CM | POA: Diagnosis not present

## 2018-09-18 DIAGNOSIS — I509 Heart failure, unspecified: Secondary | ICD-10-CM | POA: Diagnosis not present

## 2018-09-18 DIAGNOSIS — F319 Bipolar disorder, unspecified: Secondary | ICD-10-CM | POA: Insufficient documentation

## 2018-09-18 DIAGNOSIS — Z79899 Other long term (current) drug therapy: Secondary | ICD-10-CM | POA: Insufficient documentation

## 2018-09-18 DIAGNOSIS — F17210 Nicotine dependence, cigarettes, uncomplicated: Secondary | ICD-10-CM | POA: Insufficient documentation

## 2018-09-18 DIAGNOSIS — R10817 Generalized abdominal tenderness: Secondary | ICD-10-CM | POA: Insufficient documentation

## 2018-09-18 DIAGNOSIS — R112 Nausea with vomiting, unspecified: Secondary | ICD-10-CM | POA: Diagnosis present

## 2018-09-18 DIAGNOSIS — Z794 Long term (current) use of insulin: Secondary | ICD-10-CM | POA: Diagnosis not present

## 2018-09-18 DIAGNOSIS — R631 Polydipsia: Secondary | ICD-10-CM | POA: Diagnosis not present

## 2018-09-18 DIAGNOSIS — I10 Essential (primary) hypertension: Secondary | ICD-10-CM | POA: Insufficient documentation

## 2018-09-18 DIAGNOSIS — Z87442 Personal history of urinary calculi: Secondary | ICD-10-CM | POA: Insufficient documentation

## 2018-09-18 DIAGNOSIS — E1142 Type 2 diabetes mellitus with diabetic polyneuropathy: Secondary | ICD-10-CM | POA: Diagnosis not present

## 2018-09-18 DIAGNOSIS — R739 Hyperglycemia, unspecified: Secondary | ICD-10-CM

## 2018-09-18 LAB — URINALYSIS, ROUTINE W REFLEX MICROSCOPIC
Bacteria, UA: NONE SEEN
Bilirubin Urine: NEGATIVE
Glucose, UA: >=500 mg/dL — AB
Hgb urine dipstick: NEGATIVE
Ketones, ur: 20 mg/dL — AB
Leukocytes,Ua: NEGATIVE
Nitrite: NEGATIVE
PH: 6 (ref 5.0–8.0)
Protein, ur: NEGATIVE mg/dL
Specific Gravity, Urine: 1.031 — ABNORMAL HIGH (ref 1.005–1.030)

## 2018-09-18 LAB — CBG MONITORING, ED: GLUCOSE-CAPILLARY: 537 mg/dL — AB (ref 70–99)

## 2018-09-18 LAB — PREGNANCY, URINE: Preg Test, Ur: NEGATIVE

## 2018-09-18 MED ORDER — HYDROCODONE-ACETAMINOPHEN 5-325 MG PO TABS
2.0000 | ORAL_TABLET | Freq: Once | ORAL | Status: AC
  Administered 2018-09-18: 2 via ORAL
  Filled 2018-09-18: qty 2

## 2018-09-18 MED ORDER — INSULIN ASPART 100 UNIT/ML ~~LOC~~ SOLN
10.0000 [IU] | Freq: Once | SUBCUTANEOUS | Status: AC
  Administered 2018-09-18: 10 [IU] via INTRAVENOUS

## 2018-09-18 MED ORDER — HYDROMORPHONE HCL 1 MG/ML IJ SOLN
0.5000 mg | Freq: Once | INTRAMUSCULAR | Status: AC
  Administered 2018-09-18: 0.5 mg via INTRAVENOUS
  Filled 2018-09-18: qty 1

## 2018-09-18 MED ORDER — SODIUM CHLORIDE 0.9 % IV BOLUS
1000.0000 mL | Freq: Once | INTRAVENOUS | Status: AC
  Administered 2018-09-18: 1000 mL via INTRAVENOUS

## 2018-09-18 MED ORDER — PROMETHAZINE HCL 25 MG/ML IJ SOLN
25.0000 mg | Freq: Once | INTRAMUSCULAR | Status: AC
  Administered 2018-09-18: 25 mg via INTRAVENOUS
  Filled 2018-09-18: qty 1

## 2018-09-18 NOTE — ED Triage Notes (Signed)
Pt c/o nausea/vomiting and "fluid build up". Denies chest pain/shortness of breath.

## 2018-09-18 NOTE — ED Provider Notes (Signed)
[facility] EMERGENCY DEPARTMENT Provider Note   CSN: [id number] Arrival date & time: 09/18/18  2136    History   Chief Complaint Chief Complaint  Patient presents with   Emesis    HPI [name] is a 43 y.o. female with history of bipolar, type 2 diabetes, polysubstance abuse, anxiety, anemia presents emergency department today with chief complaint of nausea with emesis x2 days.  Patient reports she has been unable to tolerate any p.o. intake.  Patient estimates that she has had 10 episodes of nonbloody nonbilious emesis.  Patient has taken her insulin in 2 days.  Patient is also reporting associated polydipsia, polyuria, and diffuse abdominal pain.  She describes the pain as cramping that is located throughout entire abdomen.  Is also reporting burning pain located in bilateral feet and lower back.  She has history of similar pain and states she has this pain when her blood sugar is high.  Patient takes gabapentin for her neuropathy.  She has not missed any doses.  Patient also takes daily Percocet for chronic pain.  She states these medications are not helping her symptoms. Pt has history of kidney stone.  Patient denies fever, chills, cough, chest pain, shortness of breath, hematuria, diarrhea. Denies any sick contacts and recent travel.       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[place])    patient has a slightly swollen purplish discolored right fourth toe   Seasonal allergies    Tongue cancer ([place])    UTI (urinary tract infection)     Patient Active Problem List   Diagnosis Date Noted   Chronic anemia    Other headache syndrome    Malingering 08/14/2018   Acute CHF (congestive heart failure) ([place])  08/13/2018   Opioid abuse with opioid-induced mood disorder ([place]) 08/17/2015   Alcohol abuse 08/17/2015   Depression, major, recurrent, moderate ([place]) 08/17/2015   Benzodiazepine abuse, continuous ([place]) 08/16/2015   Opioid use disorder, moderate, dependence ([place]) 08/16/2015   Major depressive disorder, single episode, moderate ([place]) 08/16/2015   Benzodiazepine abuse ([place]) 08/16/2015   Constipation due to opioid therapy 04/28/2014   Nausea and vomiting in adult    Cellulitis, abdominal wall 04/27/2014   Intractable nausea and vomiting 04/27/2014   Drug-seeking behavior 04/27/2014   Broken toe 03/30/2014   Abdominal pain, other specified site 04/12/2013   Diabetes mellitus, new onset ([place]) 04/12/2013   Diarrhea 04/12/2013   Clostridium difficile colitis 03/22/2013   HTN (hypertension) 03/22/2013   History of MRSA infection with recurrent vulvar abscesses 03/10/2013   Vaginal candidiasis 03/10/2013   Nausea and vomiting 03/07/2013   Hypokalemia 03/07/2013   Acute pancreatitis 09/05/2012   Nausea & vomiting 09/02/2012   Abdominal pain 09/02/2012   Narcotic abuse ([place]) 07/04/2012   DIABETES MELLITUS, TYPE II 05/05/2007   DIABETIC PERIPHERAL NEUROPATHY 05/05/2007   Microcytic anemia 05/05/2007   ANXIETY 05/05/2007   TOBACCO ABUSE 05/05/2007   GERD 05/05/2007   Backache 05/05/2007    Past Surgical History:  Procedure Laterality Date   CESAREAN SECTION     CHOLECYSTECTOMY     ESOPHAGOGASTRODUODENOSCOPY N/A 09/07/2012   Procedure: ESOPHAGOGASTRODUODENOSCOPY (EGD);  Surgeon:  Juanita Craver, MD;  Location: [facility];  Service: Endoscopy;  Laterality: N/A;     OB History    Gravida  8   Para  8   Term  7   Preterm  1   AB  0   Living  7     SAB  0   TAB      Ectopic      Multiple      Live Births               Home Medications    Prior to Admission medications   Medication Sig Start Date End Date Taking? Authorizing  Provider  amitriptyline (ELAVIL) 150 MG tablet Take 150 mg by mouth at bedtime. 07/28/18   [provider]  ferrous fumarate (HEMOCYTE - 106 MG FE) 325 (106 Fe) MG TABS tablet Take 1 tablet (106 mg of iron total) by mouth 2 (two) times daily with a meal. 08/15/18   Domenic Polite, MD  furosemide (LASIX) 20 MG tablet Take 1 tablet (20 mg total) by mouth as needed for fluid or edema. 08/15/18   Domenic Polite, MD  gabapentin (NEURONTIN) 300 MG capsule Take 900 mg by mouth 3 (three) times daily.    [provider]  glipiZIDE (GLUCOTROL) 10 MG tablet Take 20 mg by mouth daily. 07/25/18   [provider]  insulin aspart (NOVOLOG FLEXPEN) 100 UNIT/ML FlexPen Inject 30 Units into the skin 2 (two) times daily with a meal. Patient taking differently: Inject 40 Units into the skin 2 (two) times daily with a meal.  08/11/18   Veryl Speak, MD  Insulin Glargine (LANTUS SOLOSTAR) 100 UNIT/ML Solostar Pen Inject 80 Units into the skin at bedtime. For diabetes management 08/15/18   Domenic Polite, MD  metFORMIN (GLUCOPHAGE) 1000 MG tablet Take 1 tablet (1,000 mg total) by mouth 2 (two) times daily with a meal. For diabetes management Patient not taking: Reported on 08/13/2018 08/18/15   Lindell Spar I, NP  metoprolol tartrate (LOPRESSOR) 25 MG tablet Take 0.5 tablets (12.5 mg total) by mouth 2 (two) times daily. 08/15/18   Domenic Polite, MD  nicotine (NICODERM CQ - DOSED IN MG/24 HOURS) 21 mg/24hr patch Place 1 patch (21 mg total) onto the skin daily. For smoking cessation Patient not taking: Reported on 08/13/2018 08/18/15   Lindell Spar I, NP    Family History Family History  Problem Relation Age of Onset   Hypertension Mother    Diabetes Mother    Hypertension Maternal Aunt    Diabetes Maternal Aunt    Hypertension Maternal Uncle    Diabetes Maternal Uncle    Hypertension Maternal Grandmother     Social History Social History   Tobacco Use   Smoking status: Current  Every Day Smoker    Packs/day: 0.50    Years: 25.00    Pack years: 12.50    Types: Cigarettes   Smokeless tobacco: Never Used  Substance Use Topics   Alcohol use: No    Comment: Denies   Drug use: No    Comment: Denies     Allergies   Reglan [metoclopramide]; Bentyl [dicyclomine hcl]; Compazine [prochlorperazine edisylate]; Ketorolac; Morphine and related; and Zofran [ondansetron hcl]   Review of Systems Review of Systems  Constitutional: Negative for chills and fever.  HENT: Negative for congestion, ear discharge, ear pain, sinus pressure, sinus pain and sore throat.   Eyes: Negative for pain and redness.  Respiratory: Negative for cough and shortness of breath.  Cardiovascular: Negative for chest pain.  Gastrointestinal: Positive for nausea and vomiting. Negative for abdominal pain, constipation and diarrhea.  Endocrine: Positive for polydipsia and polyuria.  Genitourinary: Negative for dysuria and hematuria.  Musculoskeletal: Positive for back pain. Negative for neck pain.  Skin: Negative for wound.  Neurological: Negative for weakness, numbness and headaches.     Physical Exam Updated Vital Signs BP (!) 152/86    Pulse (!) 106    Resp 16    SpO2 99%   Physical Exam Vitals signs and nursing note reviewed.  Constitutional:      General: She is not in acute distress.    Appearance: She is well-developed. She is not toxic-appearing.  HENT:     Head: Normocephalic and atraumatic.     Nose: Nose normal.     Mouth/Throat:     Mouth: Mucous membranes are dry.     Pharynx: Oropharynx is clear.  Eyes:     General: No scleral icterus.       Right eye: No discharge.        Left eye: No discharge.     Extraocular Movements: Extraocular movements intact.     Conjunctiva/sclera: Conjunctivae normal.     Pupils: Pupils are equal, round, and reactive to light.  Neck:     Musculoskeletal: Normal range of motion.  Cardiovascular:     Rate and Rhythm: Regular rhythm.  Tachycardia present.     Pulses: Normal pulses.     Heart sounds: Normal heart sounds.  Pulmonary:     Effort: Pulmonary effort is normal.     Breath sounds: Normal breath sounds.  Abdominal:     General: Bowel sounds are normal. There is no distension.     Palpations: Abdomen is soft.     Tenderness: There is no guarding or rebound.     Comments: Pt with generalized tenderness on palpation. No peritoneal signs.  Musculoskeletal: Normal range of motion.     Comments: Nontender to palpation of cervical, thoracic, lumbar, sacral spine.  Full range of motion with flexion and extension.  No surrounding erythema or warmth.  Non tender to palpation of paraspinal muscles of lumbar spine  Skin:    General: Skin is warm and dry.     Capillary Refill: Capillary refill takes less than 2 seconds.     Comments: No lacerations or wounds noted on bilateral feet.   Neurological:     Mental Status: She is oriented to person, place, and time.     Comments: Speech is clear and goal oriented. Normal strength in lower extremities bilaterally including dorsiflexion and plantar flexion. Sensation normal to light and sharp touch in bialteral lower extremities.   Psychiatric:        Behavior: Behavior normal.      ED Treatments / Results  Labs (all labs ordered are listed, but only abnormal results are displayed) Labs Reviewed  COMPREHENSIVE METABOLIC PANEL - Abnormal; Notable for the following components:      Result Value   Sodium 129 (*)    CO2 18 (*)    Glucose, Bld 502 (*)    All other components within normal limits  CBC WITH DIFFERENTIAL/PLATELET - Abnormal; Notable for the following components:   WBC 13.8 (*)    RBC 6.59 (*)    MCV 63.3 (*)    MCH 19.0 (*)    RDW 26.3 (*)    Neutro Abs 10.8 (*)    All other components within normal limits  URINALYSIS,  ROUTINE W REFLEX MICROSCOPIC - Abnormal; Notable for the following components:   Color, Urine STRAW (*)    Specific Gravity, Urine  1.031 (*)    Glucose, UA >=500 (*)    Ketones, ur 20 (*)    All other components within normal limits  CBG MONITORING, ED - Abnormal; Notable for the following components:   Glucose-Capillary 537 (*)    All other components within normal limits  PREGNANCY, URINE    EKG None  Radiology No results found.  Procedures Procedures (including critical care time)  Medications Ordered in ED Medications  sodium chloride 0.9 % bolus 1,000 mL (0 mLs Intravenous Stopped 09/19/18 0052)  insulin aspart (novoLOG) injection 10 Units (10 Units Intravenous Given 09/18/18 2231)  HYDROcodone-acetaminophen (NORCO/VICODIN) 5-325 MG per tablet 2 tablet (2 tablets Oral Given 09/18/18 2237)  promethazine (PHENERGAN) injection 25 mg (25 mg Intravenous Given 09/18/18 2344)  HYDROmorphone (DILAUDID) injection 0.5 mg (0.5 mg Intravenous Given 09/18/18 2349)  sodium chloride 0.9 % bolus 1,000 mL (1,000 mLs Intravenous New Bag/Given 09/19/18 0107)     Initial Impression / Assessment and Plan / ED Course  I have reviewed the triage vital signs and the nursing notes.  Pertinent labs & imaging results that were available during my care of the patient were reviewed by me and considered in my medical decision making (see chart for details).    Patient is tachycardic on arrival to the 130s.  She appears uncomfortable secondary to back pain and nausea.  She has not been compliant with her insulin and has this x2 days.  DDX includes hypercalcemia, DKA secondary to noncompliance.  The back pain is burning and consistent with her history of neuropathy.  Will give home dose of gabapentin.  Patient does not have any wounds to suggest diabetic ulcer or cellulitis.  Also pain control with 0.5 mcg of dilaudid. Pt has history of drug seeking behavior. Discussed a one time dose of dilaudid while here. Will reassess and discuss other treatment options.  Work-up today shows UA with glucose >500 and 20 ketones.  CMP with glucose of  502.  Patient does not have an elevated anion gap.  However she does have bicarb of 18 and hyponatremia of 129.  While she does not appear to be in DKA it is possible she will be very soon.  Plan is to give 2 L IV fluids and 10 units regular insulin.  Will recheck glucose and BMP to see if her bicarb has improved and what her anion gap is at that time.  Her tachycardia has improved after 1 L of fluids, plan to reassess after the second liter is in. CBC shows leukocytosis of 13.8.  She has had multiple episodes of vomiting, indicating possible stress induced increase.  At shift change care was transferred to K. Humes PA who will follow pending studies, re-evaulate and determine disposition.  Depending on repeat BMP, glucose, and vitals to determine if appropriate to discharge home or if she needs admission.  Vitals:   09/19/18 0045 09/19/18 0118  BP: (!) 152/86 137/82  Pulse: (!) 106 (!) 107  Resp:  16  Temp:  98.1 F (36.7 C)  SpO2: 99% 100%   This note was prepared with assistance of Dragon voice recognition software. Occasional wrong-word or sound-a-like substitutions may have occurred due to the inherent limitations of voice recognition software.  Final Clinical Impressions(s) / ED Diagnoses   Final diagnoses:  None    ED Discharge Orders  None       Flint Melter 09/19/18 0142    Merrily Pew, MD 09/22/18 [phone]

## 2018-09-19 DIAGNOSIS — E1165 Type 2 diabetes mellitus with hyperglycemia: Secondary | ICD-10-CM | POA: Diagnosis not present

## 2018-09-19 LAB — CBC WITH DIFFERENTIAL/PLATELET
Abs Immature Granulocytes: 0.06 10*3/uL (ref 0.00–0.07)
BASOS ABS: 0.1 10*3/uL (ref 0.0–0.1)
Basophils Relative: 1 %
Eosinophils Absolute: 0.3 10*3/uL (ref 0.0–0.5)
Eosinophils Relative: 2 %
HCT: 41.7 % (ref 36.0–46.0)
HEMOGLOBIN: 12.5 g/dL (ref 12.0–15.0)
Immature Granulocytes: 0 %
LYMPHS ABS: 2.1 10*3/uL (ref 0.7–4.0)
Lymphocytes Relative: 15 %
MCH: 19 pg — ABNORMAL LOW (ref 26.0–34.0)
MCHC: 30 g/dL (ref 30.0–36.0)
MCV: 63.3 fL — ABNORMAL LOW (ref 80.0–100.0)
Monocytes Absolute: 0.6 10*3/uL (ref 0.1–1.0)
Monocytes Relative: 4 %
Neutro Abs: 10.8 10*3/uL — ABNORMAL HIGH (ref 1.7–7.7)
Neutrophils Relative %: 78 %
Platelets: 282 10*3/uL (ref 150–400)
RBC: 6.59 MIL/uL — ABNORMAL HIGH (ref 3.87–5.11)
RDW: 26.3 % — ABNORMAL HIGH (ref 11.5–15.5)
WBC: 13.8 10*3/uL — ABNORMAL HIGH (ref 4.0–10.5)
nRBC: 0 % (ref 0.0–0.2)

## 2018-09-19 LAB — BASIC METABOLIC PANEL
Anion gap: 10 (ref 5–15)
BUN: 12 mg/dL (ref 6–20)
CO2: 19 mmol/L — ABNORMAL LOW (ref 22–32)
Calcium: 8.6 mg/dL — ABNORMAL LOW (ref 8.9–10.3)
Chloride: 104 mmol/L (ref 98–111)
Creatinine, Ser: 0.79 mg/dL (ref 0.44–1.00)
GFR calc Af Amer: >60 mL/min (ref >60)
GFR calc non Af Amer: >60 mL/min (ref >60)
Glucose, Bld: 411 mg/dL — ABNORMAL HIGH (ref 70–99)
Potassium: 4.1 mmol/L (ref 3.5–5.1)
SODIUM: 133 mmol/L — AB (ref 135–145)

## 2018-09-19 LAB — COMPREHENSIVE METABOLIC PANEL
ALT: 17 U/L (ref 0–44)
AST: 30 U/L (ref 15–41)
Albumin: 3.6 g/dL (ref 3.5–5.0)
Alkaline Phosphatase: 88 U/L (ref 38–126)
Anion gap: 10 (ref 5–15)
BUN: 14 mg/dL (ref 6–20)
CO2: 18 mmol/L — ABNORMAL LOW (ref 22–32)
Calcium: 9 mg/dL (ref 8.9–10.3)
Chloride: 101 mmol/L (ref 98–111)
Creatinine, Ser: 0.76 mg/dL (ref 0.44–1.00)
GFR calc Af Amer: >60 mL/min (ref >60)
GFR calc non Af Amer: >60 mL/min (ref >60)
Glucose, Bld: 502 mg/dL (ref 70–99)
Potassium: 4.4 mmol/L (ref 3.5–5.1)
Sodium: 129 mmol/L — ABNORMAL LOW (ref 135–145)
Total Bilirubin: 1.1 mg/dL (ref 0.3–1.2)
Total Protein: 7.7 g/dL (ref 6.5–8.1)

## 2018-09-19 LAB — CBG MONITORING, ED
GLUCOSE-CAPILLARY: 376 mg/dL — AB (ref 70–99)
Glucose-Capillary: 413 mg/dL — ABNORMAL HIGH (ref 70–99)

## 2018-09-19 MED ORDER — INSULIN ASPART 100 UNIT/ML ~~LOC~~ SOLN
20.0000 [IU] | Freq: Once | SUBCUTANEOUS | Status: AC
  Administered 2018-09-19: 20 [IU] via SUBCUTANEOUS

## 2018-09-19 MED ORDER — SODIUM CHLORIDE 0.9 % IV BOLUS
1000.0000 mL | Freq: Once | INTRAVENOUS | Status: AC
  Administered 2018-09-19: 1000 mL via INTRAVENOUS

## 2018-09-19 MED ORDER — PROMETHAZINE HCL 25 MG/ML IJ SOLN
12.5000 mg | Freq: Once | INTRAMUSCULAR | Status: AC
  Administered 2018-09-19: 12.5 mg via INTRAVENOUS
  Filled 2018-09-19: qty 1

## 2018-09-19 MED ORDER — KETOROLAC TROMETHAMINE 30 MG/ML IJ SOLN
30.0000 mg | Freq: Once | INTRAMUSCULAR | Status: AC
  Administered 2018-09-19: 30 mg via INTRAVENOUS
  Filled 2018-09-19: qty 1

## 2018-09-19 MED ORDER — SODIUM CHLORIDE 0.9 % IV BOLUS
500.0000 mL | Freq: Once | INTRAVENOUS | Status: AC
  Administered 2018-09-19: 500 mL via INTRAVENOUS

## 2018-09-19 NOTE — ED Provider Notes (Signed)
2:25 AM Patient care assumed at shift change from [facility], PA-C.  Patient with known history of chronic abdominal pain presenting for worsening abdominal pain as well as tachycardia.  Her tachycardia has improved with IV fluids.  She has remained hemodynamically stable otherwise.  Initial work-up revealed hyperglycemia which has improved with IV fluid hydration.  Her leukocytosis is chronic, stable.  She did have slight ketonuria with low bicarb, normal anion gap.  Plan at shift change discussed with [place], PA-C which includes recheck of metabolic panel following completion of second liter of IV fluids.  I presented to the patient's bedside for repeat assessment.  She was noted to be sobbing in the bed, but distractible.  Continues to complain of persistent abdominal pain.  I have explained to the patient that her pain is chronic in nature and that we do not manage chronic pain in the emergency department.  She was previously given an IV dose of narcotics as well as a tablet of Norco.  I have explained the reasons for not continuing with opiate pain control at this time.  Patient has allergies listed for Bentyl as well as Toradol, but has received Toradol on multiple occasions in 2014 and 2015.  She takes ibuprofen without issue and would like to try a dose of Toradol today.  I do not believe this is unreasonable.  Patient ordered to receive 30 mg IV Toradol in addition to an additional 12.5 mg IV Phenergan.  Order placed for repeat BMP.  Plan for discharge if improved.  3:30 AM Patient requesting to speak with me again.  She is wondering if she can have something additional for pain.  I have offered her Tylenol which she declines as she has this in her purse.  She has tolerated the Toradol without signs of allergic reaction.  Reports that this medication only helped for approximately 30 minutes before her pain recurred.  Patient also wondering why she only received 10 units of insulin.  I  explained that IV insulin works quicker and she was receiving IV fluids for hydration to help improve her blood sugar.  She verbalizes understanding.  She has tolerated oral fluids as well as a sandwich without difficulty.  She has not had any vomiting while in the emergency department.  4:05 AM Patient's labs resulted with CBG of 411.  This is after oral intake in the ED.  She will receive 20 units of subcutaneous insulin.  Her bicarb has gone up to 19.  Of note, patient was discharged from the hospital on 08/15/2018 with a bicarb of 20.  She has averaged a bicarb of 20-21 over the past 2 months.  She has no increased anion gap.  Ketonuria has also been present chronically since 2015.  She has received a total of 2.5L IVF since arrival.  The patient's tachycardia has significantly improved.  She has had no clinical decompensation while in the emergency department.  I believe she can be reasonably managed further on an outpatient basis.  Encouraged strict adherence to her insulin regimen.  Return precautions discussed and provided. Patient discharged in stable condition with no unaddressed concerns.   Vitals:   09/19/18 0130 09/19/18 0145 09/19/18 0215 09/19/18 0300  BP: (!) 146/71 140/86 (!) 148/76 (!) 167/92  Pulse: (!) 106 (!) 104 (!) 106 (!) 101  Resp:   18   Temp:      TempSrc:      SpO2: 100% 100% 100% 100%  Antonietta Breach, PA-C 76/73/41 [id number]    Delora Fuel, MD 02/40/97 [phone]

## 2018-09-19 NOTE — ED Notes (Signed)
Pt given [place] sandwich bag and diet coke per request. Has already had saltines and cheese prior.

## 2018-09-19 NOTE — ED Notes (Signed)
Pt given hot pack for back pain.

## 2018-09-19 NOTE — Discharge Instructions (Addendum)
We strongly advised that you take your insulin regimen as prescribed to you.  Drink plenty of water to prevent dehydration.  Closely follow-up with your primary care doctor for evaluation of your ongoing abdominal pain.  You may return for new or concerning symptoms.

## 2018-09-19 NOTE — ED Notes (Signed)
ED Provider at bedside. 

## 2018-09-19 NOTE — ED Notes (Signed)
Pt took gabapentin from her home medications. Took three 300mg  tablets. PA aware.

## 2018-09-19 NOTE — ED Notes (Signed)
Per RN,  Collect bmp once fluids are complete.

## 2018-09-25 ENCOUNTER — Emergency Department (HOSPITAL_COMMUNITY): Payer: Medicare HMO

## 2018-09-25 ENCOUNTER — Other Ambulatory Visit: Payer: Self-pay

## 2018-09-25 ENCOUNTER — Emergency Department (HOSPITAL_COMMUNITY)
Admission: EM | Admit: 2018-09-25 | Discharge: 2018-09-25 | Disposition: A | Discharge status: Home / Self Care | Payer: Medicare HMO | Attending: Emergency Medicine | Admitting: Emergency Medicine

## 2018-09-25 DIAGNOSIS — I10 Essential (primary) hypertension: Secondary | ICD-10-CM | POA: Diagnosis not present

## 2018-09-25 DIAGNOSIS — E1140 Type 2 diabetes mellitus with diabetic neuropathy, unspecified: Secondary | ICD-10-CM | POA: Insufficient documentation

## 2018-09-25 DIAGNOSIS — Y999 Unspecified external cause status: Secondary | ICD-10-CM | POA: Insufficient documentation

## 2018-09-25 DIAGNOSIS — Z794 Long term (current) use of insulin: Secondary | ICD-10-CM | POA: Insufficient documentation

## 2018-09-25 DIAGNOSIS — Z79899 Other long term (current) drug therapy: Secondary | ICD-10-CM | POA: Diagnosis not present

## 2018-09-25 DIAGNOSIS — Y939 Activity, unspecified: Secondary | ICD-10-CM | POA: Insufficient documentation

## 2018-09-25 DIAGNOSIS — N611 Abscess of the breast and nipple: Secondary | ICD-10-CM | POA: Diagnosis not present

## 2018-09-25 DIAGNOSIS — Y929 Unspecified place or not applicable: Secondary | ICD-10-CM | POA: Diagnosis not present

## 2018-09-25 DIAGNOSIS — W01198A Fall on same level from slipping, tripping and stumbling with subsequent striking against other object, initial encounter: Secondary | ICD-10-CM | POA: Diagnosis not present

## 2018-09-25 DIAGNOSIS — R739 Hyperglycemia, unspecified: Secondary | ICD-10-CM

## 2018-09-25 DIAGNOSIS — R1110 Vomiting, unspecified: Secondary | ICD-10-CM | POA: Diagnosis present

## 2018-09-25 DIAGNOSIS — S8991XA Unspecified injury of right lower leg, initial encounter: Secondary | ICD-10-CM | POA: Diagnosis not present

## 2018-09-25 DIAGNOSIS — F17210 Nicotine dependence, cigarettes, uncomplicated: Secondary | ICD-10-CM | POA: Insufficient documentation

## 2018-09-25 DIAGNOSIS — E1165 Type 2 diabetes mellitus with hyperglycemia: Secondary | ICD-10-CM | POA: Insufficient documentation

## 2018-09-25 LAB — URINALYSIS, ROUTINE W REFLEX MICROSCOPIC
Bacteria, UA: NONE SEEN
Bilirubin Urine: NEGATIVE
Glucose, UA: >=500 mg/dL — AB
Hgb urine dipstick: NEGATIVE
Ketones, ur: NEGATIVE mg/dL
Leukocytes,Ua: NEGATIVE
Nitrite: NEGATIVE
Protein, ur: NEGATIVE mg/dL
Specific Gravity, Urine: 1.028 (ref 1.005–1.030)
pH: 7 (ref 5.0–8.0)

## 2018-09-25 LAB — COMPREHENSIVE METABOLIC PANEL
ALT: 15 U/L (ref 0–44)
AST: 25 U/L (ref 15–41)
Albumin: 3.7 g/dL (ref 3.5–5.0)
Alkaline Phosphatase: 78 U/L (ref 38–126)
Anion gap: 13 (ref 5–15)
BUN: 5 mg/dL — ABNORMAL LOW (ref 6–20)
CO2: 21 mmol/L — ABNORMAL LOW (ref 22–32)
Calcium: 9.3 mg/dL (ref 8.9–10.3)
Chloride: 97 mmol/L — ABNORMAL LOW (ref 98–111)
Creatinine, Ser: 0.83 mg/dL (ref 0.44–1.00)
GFR calc Af Amer: >60 mL/min (ref >60)
GFR calc non Af Amer: >60 mL/min (ref >60)
Glucose, Bld: 648 mg/dL (ref 70–99)
Potassium: 3.5 mmol/L (ref 3.5–5.1)
Sodium: 131 mmol/L — ABNORMAL LOW (ref 135–145)
Total Bilirubin: 0.4 mg/dL (ref 0.3–1.2)
Total Protein: 7.7 g/dL (ref 6.5–8.1)

## 2018-09-25 LAB — POCT I-STAT EG7
Acid-base deficit: 5 mmol/L — ABNORMAL HIGH (ref 0.0–2.0)
Bicarbonate: 19.1 mmol/L — ABNORMAL LOW (ref 20.0–28.0)
Calcium, Ion: 1.13 mmol/L — ABNORMAL LOW (ref 1.15–1.40)
HCT: 42 % (ref 36.0–46.0)
Hemoglobin: 14.3 g/dL (ref 12.0–15.0)
O2 Saturation: 84 %
Potassium: 3.7 mmol/L (ref 3.5–5.1)
Sodium: 132 mmol/L — ABNORMAL LOW (ref 135–145)
TCO2: 20 mmol/L — ABNORMAL LOW (ref 22–32)
pCO2, Ven: 32.8 mmHg — ABNORMAL LOW (ref 44.0–60.0)
pH, Ven: 7.373 (ref 7.250–7.430)
pO2, Ven: 49 mmHg — ABNORMAL HIGH (ref 32.0–45.0)

## 2018-09-25 LAB — CBC WITH DIFFERENTIAL/PLATELET
Abs Immature Granulocytes: 0 10*3/uL (ref 0.00–0.07)
Basophils Absolute: 0.2 10*3/uL — ABNORMAL HIGH (ref 0.0–0.1)
Basophils Relative: 2 %
Eosinophils Absolute: 0.1 10*3/uL (ref 0.0–0.5)
Eosinophils Relative: 1 %
HCT: 41.9 % (ref 36.0–46.0)
Hemoglobin: 12 g/dL (ref 12.0–15.0)
Lymphocytes Relative: 17 %
Lymphs Abs: 2.1 10*3/uL (ref 0.7–4.0)
MCH: 18.8 pg — ABNORMAL LOW (ref 26.0–34.0)
MCHC: 28.6 g/dL — ABNORMAL LOW (ref 30.0–36.0)
MCV: 65.7 fL — ABNORMAL LOW (ref 80.0–100.0)
Monocytes Absolute: 0.6 10*3/uL (ref 0.1–1.0)
Monocytes Relative: 5 %
Neutro Abs: 9.3 10*3/uL — ABNORMAL HIGH (ref 1.7–7.7)
Neutrophils Relative %: 75 %
Platelets: 287 10*3/uL (ref 150–400)
RBC: 6.38 MIL/uL — ABNORMAL HIGH (ref 3.87–5.11)
RDW: 25.8 % — ABNORMAL HIGH (ref 11.5–15.5)
WBC: 12.4 10*3/uL — ABNORMAL HIGH (ref 4.0–10.5)
nRBC: 0 % (ref 0.0–0.2)
nRBC: 0 /100 WBC

## 2018-09-25 LAB — CBG MONITORING, ED
Glucose-Capillary: 497 mg/dL — ABNORMAL HIGH (ref 70–99)
Glucose-Capillary: 395 mg/dL — ABNORMAL HIGH (ref 70–99)
Glucose-Capillary: 244 mg/dL — ABNORMAL HIGH (ref 70–99)

## 2018-09-25 LAB — I-STAT BETA HCG BLOOD, ED (MC, WL, AP ONLY): I-stat hCG, quantitative: <5 m[IU]/mL (ref <5)

## 2018-09-25 MED ORDER — ACETAMINOPHEN 325 MG PO TABS
650.0000 mg | ORAL_TABLET | Freq: Once | ORAL | Status: AC
  Administered 2018-09-25: 17:00:00 650 mg via ORAL
  Filled 2018-09-25: qty 2

## 2018-09-25 MED ORDER — SODIUM CHLORIDE 0.9 % IV BOLUS
1000.0000 mL | Freq: Once | INTRAVENOUS | Status: AC
  Administered 2018-09-25: 1000 mL via INTRAVENOUS

## 2018-09-25 MED ORDER — DOXYCYCLINE HYCLATE 100 MG PO CAPS: 100.0000 mg | ORAL_CAPSULE | Freq: Two times a day (BID) | ORAL | 0 refills | Status: AC

## 2018-09-25 MED ORDER — INSULIN GLARGINE 100 UNIT/ML ~~LOC~~ SOLN
100.0000 [IU] | Freq: Once | SUBCUTANEOUS | Status: AC
  Administered 2018-09-25: 15:00:00 100 [IU] via SUBCUTANEOUS
  Filled 2018-09-25: qty 1

## 2018-09-25 MED ORDER — INSULIN ASPART 100 UNIT/ML ~~LOC~~ SOLN
10.0000 [IU] | Freq: Once | SUBCUTANEOUS | Status: AC
  Administered 2018-09-25: 10 [IU] via SUBCUTANEOUS

## 2018-09-25 MED ORDER — INSULIN REGULAR(HUMAN) IN NACL 100-0.9 UT/100ML-% IV SOLN
INTRAVENOUS | Status: DC
  Filled 2018-09-25: qty 100

## 2018-09-25 MED ORDER — LIDOCAINE-EPINEPHRINE (PF) 2 %-1:200000 IJ SOLN
10.0000 mL | Freq: Once | INTRAMUSCULAR | Status: DC
  Filled 2018-09-25: qty 20

## 2018-09-25 MED ORDER — POTASSIUM CHLORIDE 10 MEQ/100ML IV SOLN
10.0000 meq | INTRAVENOUS | Status: DC
  Filled 2018-09-25: qty 100

## 2018-09-25 MED ORDER — PROMETHAZINE HCL 25 MG/ML IJ SOLN
12.5000 mg | Freq: Once | INTRAMUSCULAR | Status: AC
  Administered 2018-09-25: 12.5 mg via INTRAVENOUS
  Filled 2018-09-25: qty 1

## 2018-09-25 MED ORDER — HYDROMORPHONE HCL 1 MG/ML IJ SOLN
0.5000 mg | Freq: Once | INTRAMUSCULAR | Status: AC
  Administered 2018-09-25: 13:00:00 0.5 mg via INTRAVENOUS
  Filled 2018-09-25: qty 1

## 2018-09-25 MED ORDER — HYDROMORPHONE HCL 1 MG/ML IJ SOLN
0.5000 mg | Freq: Once | INTRAMUSCULAR | Status: AC
  Administered 2018-09-25: 15:00:00 0.5 mg via INTRAVENOUS
  Filled 2018-09-25: qty 1

## 2018-09-25 NOTE — ED Notes (Signed)
[name] -- please call with updates-- [phone]

## 2018-09-25 NOTE — ED Provider Notes (Signed)
[facility]ER[facility] DEPARTMENT Provider Note   CSN: [id number] Arrival date & time: 09/25/18  1202    History   Chief Complaint Chief Complaint  Patient presents with  . Emesis  . Knee Pain    HPI [name] is a 43 y.o. female.     HPI  [name] is a 42 y.o. female, with a history of DM, bipolar, anxiety, presenting to the ED with nausea and vomiting for the last 2 days.  Estimates approximately 10 episodes of nonbloody, nonbilious emesis.  States she is supposed to take 100 units of Lantus twice daily, but has run out and has not had it for the last week. She has an appointment with her PCP tomorrow to address this. Her last dose of 70/30 NovoLog was last night.  Accompanied by polydipsia.  Denies fever/chills, cough, shortness of breath, chest pain, abdominal pain, dysuria, diarrhea.  Complains of right knee pain.  States she sustained a mechanical fall from tripping, landed on the right knee.  Pain is throbbing, 10/10, anterior right knee, nonradiating.  Denies numbness, weakness, swelling.  Also complains of painful mass on the underside of the left breast.  States she has had abscesses around her breast before and thinks this is a recurrence.  It has been recommended for her to follow-up with general surgery on these matters, but she has not done so.       Past Medical History:  Diagnosis Date  . Abnormal Pap smear   . Anemia   . Anxiety   . Bipolar 1 disorder ([place])   . Depression   . Diabetes mellitus   . Diabetes mellitus without complication ([place])   . Gout   . Headache(784.0)   . Kidney calculi   . 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 Seasonal allergies   . Tongue cancer ([place]   . UTI (urinary tract infection)     Patient Active Problem List   Diagnosis Date Noted  . Chronic anemia   . Other headache syndrome   . Malingering 08/14/2018  . Acute CHF  (congestive heart failure) ([place]) 08/13/2018  . Opioid abuse with opioid-induced mood disorder ([place]) 08/17/2015  . Alcohol abuse 08/17/2015  . Depression, major, recurrent, moderate ([place]) 08/17/2015  . Benzodiazepine abuse, continuous ([place]) 08/16/2015  . Opioid use disorder, moderate, dependence ([place]) 08/16/2015  . Major depressive disorder, single episode, moderate ([place]) 08/16/2015  . Benzodiazepine abuse ([place]) 08/16/2015  . Constipation due to opioid therapy 04/28/2014  . Nausea and vomiting in adult   . Cellulitis, abdominal wall 04/27/2014  . Intractable nausea and vomiting 04/27/2014  . Drug-seeking behavior 04/27/2014  . Broken toe 03/30/2014  . Abdominal pain, other specified site 04/12/2013  . Diabetes mellitus, new onset ([place]) 04/12/2013  . Diarrhea 04/12/2013  . Clostridium difficile colitis 03/22/2013  . HTN (hypertension) 03/22/2013  . History of MRSA infection with recurrent vulvar abscesses 03/10/2013  . Vaginal candidiasis 03/10/2013  . Nausea and vomiting 03/07/2013  . Hypokalemia 03/07/2013  . Acute pancreatitis 09/05/2012  . Nausea & vomiting 09/02/2012  . Abdominal pain 09/02/2012  . Narcotic abuse ([place]) 07/04/2012  . DIABETES MELLITUS, TYPE II 05/05/2007  . DIABETIC PERIPHERAL NEUROPATHY 05/05/2007  . Microcytic anemia 05/05/2007  . ANXIETY 05/05/2007  . TOBACCO ABUSE 05/05/2007  . GERD 05/05/2007  . Backache 05/05/2007    Past Surgical History:  Procedure Laterality Date  . CESAREAN  SECTION    . CHOLECYSTECTOMY    . ESOPHAGOGASTRODUODENOSCOPY N/A 09/07/2012   Procedure: ESOPHAGOGASTRODUODENOSCOPY (EGD);  Surgeon: Juanita Craver, MD;  Location: [facility];  Service: Endoscopy;  Laterality: N/A;     OB History    Gravida  8   Para  8   Term  7   Preterm  1   AB  0   Living  7     SAB  0   TAB      Ectopic      Multiple      Live Births               Home Medications    Prior to Admission medications   Medication Sig Start  Date End Date Taking? Authorizing Provider  amitriptyline (ELAVIL) 150 MG tablet Take 150 mg by mouth at bedtime. 07/28/18   [provider]  doxycycline (VIBRAMYCIN) 100 MG capsule Take 1 capsule (100 mg total) by mouth 2 (two) times daily for 7 days. 09/25/18 10/02/18  ,  C, PA-C  ferrous fumarate (HEMOCYTE - 106 MG FE) 325 (106 Fe) MG TABS tablet Take 1 tablet (106 mg of iron total) by mouth 2 (two) times daily with a meal. 08/15/18   Domenic Polite, MD  furosemide (LASIX) 20 MG tablet Take 1 tablet (20 mg total) by mouth as needed for fluid or edema. 08/15/18   Domenic Polite, MD  gabapentin (NEURONTIN) 300 MG capsule Take 900 mg by mouth 3 (three) times daily.    [provider]  glipiZIDE (GLUCOTROL) 10 MG tablet Take 20 mg by mouth daily. 07/25/18   [provider]  insulin aspart (NOVOLOG FLEXPEN) 100 UNIT/ML FlexPen Inject 30 Units into the skin 2 (two) times daily with a meal. Patient taking differently: Inject 40 Units into the skin 2 (two) times daily with a meal.  08/11/18   Veryl Speak, MD  Insulin Glargine (LANTUS SOLOSTAR) 100 UNIT/ML Solostar Pen Inject 80 Units into the skin at bedtime. For diabetes management 08/15/18   Domenic Polite, MD  metFORMIN (GLUCOPHAGE) 1000 MG tablet Take 1 tablet (1,000 mg total) by mouth 2 (two) times daily with a meal. For diabetes management Patient not taking: Reported on 08/13/2018 08/18/15   Lindell Spar I, NP  metoprolol tartrate (LOPRESSOR) 25 MG tablet Take 0.5 tablets (12.5 mg total) by mouth 2 (two) times daily. 08/15/18   Domenic Polite, MD  nicotine (NICODERM CQ - DOSED IN MG/24 HOURS) 21 mg/24hr patch Place 1 patch (21 mg total) onto the skin daily. For smoking cessation Patient not taking: Reported on 08/13/2018 08/18/15   Encarnacion Slates, NP    Family History Family History  Problem Relation Age of Onset  . Hypertension Mother   . Diabetes Mother   . Hypertension Maternal Aunt   . Diabetes Maternal Aunt   .  Hypertension Maternal Uncle   . Diabetes Maternal Uncle   . Hypertension Maternal Grandmother     Social History Social History   Tobacco Use  . Smoking status: Current Every Day Smoker    Packs/day: 0.50    Years: 25.00    Pack years: 12.50    Types: Cigarettes  . Smokeless tobacco: Never Used  Substance Use Topics  . Alcohol use: No    Comment: Denies  . Drug use: No    Comment: Denies     Allergies   Reglan [metoclopramide]; Bentyl [dicyclomine hcl]; Compazine [prochlorperazine edisylate]; Ketorolac; Morphine and related; and Zofran [ondansetron hcl]  Review of Systems Review of Systems  Constitutional: Negative for chills, diaphoresis and fever.  Respiratory: Negative for cough and shortness of breath.   Cardiovascular: Negative for chest pain and leg swelling.  Gastrointestinal: Positive for nausea and vomiting. Negative for abdominal pain, blood in stool, constipation and diarrhea.  Endocrine: Positive for polydipsia.  Genitourinary: Negative for dysuria and hematuria.  Musculoskeletal: Positive for arthralgias.  Skin: Positive for color change (Redness and pain under left breast).  Neurological: Negative for dizziness, syncope, weakness and numbness.  All other systems reviewed and are negative.    Physical Exam Updated Vital Signs BP 130/82   Pulse (!) 108   Temp (!) 97.4 F (36.3 C) (Oral)   Resp 18   Ht 5\' 6"  (1.676 m)   Wt 81.6 kg   SpO2 100%   BMI 29.05 kg/m   Physical Exam Vitals signs and nursing note reviewed.  Constitutional:      General: She is not in acute distress.    Appearance: She is well-developed. She is not diaphoretic.  HENT:     Head: Normocephalic and atraumatic.     Mouth/Throat:     Mouth: Mucous membranes are moist.     Pharynx: Oropharynx is clear.  Eyes:     Conjunctiva/sclera: Conjunctivae normal.  Neck:     Musculoskeletal: Neck supple.  Cardiovascular:     Rate and Rhythm: Regular rhythm. Tachycardia present.      Pulses: Normal pulses.          Posterior tibial pulses are 2+ on the right side and 2+ on the left side.     Comments: Tactile temperature in the extremities appropriate and equal bilaterally. Mildly tachycardic, however, patient noted to have mild tachycardia on previous documented vital signs. Pulmonary:     Effort: Pulmonary effort is normal. No respiratory distress.     Breath sounds: Normal breath sounds.  Chest:    Abdominal:     Palpations: Abdomen is soft.     Tenderness: There is no abdominal tenderness. There is no guarding.  Musculoskeletal:     Right lower leg: No edema.     Left lower leg: No edema.     Comments: Tenderness to the anterior right knee.  Patella appears to be in correct anatomical position.  No noted swelling, deformity, instability, or laxity.  Full range of motion in the right knee, though patient voices pain during range of motion.  Lymphadenopathy:     Cervical: No cervical adenopathy.  Skin:    General: Skin is warm and dry.  Neurological:     Mental Status: She is alert.     Comments: Sensation grossly intact to light touch in the bilateral lower extremities. Strength 5/5 in the bilateral lower extremities at the knee and ankle.  Psychiatric:        Mood and Affect: Mood and affect normal.        Speech: Speech normal.        Behavior: Behavior normal.      ED Treatments / Results  Labs (all labs ordered are listed, but only abnormal results are displayed) Labs Reviewed  URINALYSIS, ROUTINE W REFLEX MICROSCOPIC - Abnormal; Notable for the following components:      Result Value   Color, Urine COLORLESS (*)    Glucose, UA >=500 (*)    All other components within normal limits  CBC WITH DIFFERENTIAL/PLATELET - Abnormal; Notable for the following components:   WBC 12.4 (*)    RBC  6.38 (*)    MCV 65.7 (*)    MCH 18.8 (*)    MCHC 28.6 (*)    RDW 25.8 (*)    Neutro Abs 9.3 (*)    Basophils Absolute 0.2 (*)    All other components  within normal limits  COMPREHENSIVE METABOLIC PANEL - Abnormal; Notable for the following components:   Sodium 131 (*)    Chloride 97 (*)    CO2 21 (*)    Glucose, Bld 648 (*)    BUN 5 (*)    All other components within normal limits  CBG MONITORING, ED - Abnormal; Notable for the following components:   Glucose-Capillary 497 (*)    All other components within normal limits  POCT I-STAT EG7 - Abnormal; Notable for the following components:   pCO2, Ven 32.8 (*)    pO2, Ven 49.0 (*)    Bicarbonate 19.1 (*)    TCO2 20 (*)    Acid-base deficit 5.0 (*)    Sodium 132 (*)    Calcium, Ion 1.13 (*)    All other components within normal limits  I-STAT BETA HCG BLOOD, ED ([facility], [facility], AP ONLY)  I-STAT VENOUS BLOOD GAS, ED    EKG None  Radiology Dg Knee Complete 4 Views Right  Result Date: 09/25/2018 CLINICAL DATA:  Status post fall, anterior right knee pain EXAM: RIGHT KNEE - COMPLETE 4+ VIEW COMPARISON:  None. FINDINGS: No evidence of fracture, dislocation, or joint effusion. No evidence of arthropathy or other focal bone abnormality. Soft tissues are unremarkable. IMPRESSION: Negative. Electronically Signed   By: Kathreen Devoid   On: 09/25/2018 13:34    Procedures Procedures (including critical care time)  Medications Ordered in ED Medications  lidocaine-EPINEPHrine (XYLOCAINE W/EPI) 2 %-1:200000 (PF) injection 10 mL (has no administration in time range)  promethazine (PHENERGAN) injection 12.5 mg (12.5 mg Intravenous Given 09/25/18 1306)  HYDROmorphone (DILAUDID) injection 0.5 mg (0.5 mg Intravenous Given 09/25/18 1308)  sodium chloride 0.9 % bolus 1,000 mL (0 mLs Intravenous Stopped 09/25/18 1445)  insulin glargine (LANTUS) injection 100 Units (100 Units Subcutaneous Given 09/25/18 1446)  sodium chloride 0.9 % bolus 1,000 mL (1,000 mLs Intravenous New Bag/Given 09/25/18 1504)  HYDROmorphone (DILAUDID) injection 0.5 mg (0.5 mg Intravenous Given 09/25/18 1509)  promethazine (PHENERGAN) injection 12.5  mg (12.5 mg Intravenous Given 09/25/18 1505)  insulin aspart (novoLOG) injection 10 Units (10 Units Subcutaneous Given 09/25/18 1544)     Initial Impression / Assessment and Plan / ED Course  I have reviewed the triage vital signs and the nursing notes.  Pertinent labs & imaging results that were available during my care of the patient were reviewed by me and considered in my medical decision making (see chart for details).        Patient presents with multiple complaints. Patient is nontoxic appearing, afebrile, not tachypneic, not hypotensive, maintains excellent SPO2 on room air, and is in no apparent distress.   Hyperglycemia: Hyperglycemic without elevated anion gap or acidotic pH.  Hydrated with IV fluids.  Given dose of insulin.  She has close follow-up with her PCP tomorrow.  Knee injury: No acute abnormality noted on x-ray.  Knee immobilizer and crutches for comfort.  Orthopedic follow-up for any further management.  Breast abscess: I&D was recommended and discussed with the patient.  She was explicitly informed that I&D is the recommended treatment.  She declined I&D.  We will try a course of antibiotics.  She was encouraged to follow-up with general surgery, as  previously recommended.  End of shift patient care handoff report given to Domenic Moras, PA-C. Plan: Continue to hydrate patient and monitor blood glucose for improvement.  Assure she tolerates oral fluids prior to discharge.     Final Clinical Impressions(s) / ED Diagnoses   Final diagnoses:  Hyperglycemia  Breast abscess  Injury of right knee, initial encounter    ED Discharge Orders         Ordered    doxycycline (VIBRAMYCIN) 100 MG capsule  2 times daily     09/25/18 1607           Lorayne Bender, PA-C 09/25/18 1607    Davonna Belling, MD 09/26/18 1130

## 2018-09-25 NOTE — Discharge Instructions (Addendum)
Hyperglycemia Take your insulin, as prescribed. Follow up with your primary care provider tomorrow, as planned. Return to the emergency department for any worsening symptoms.  Knee injury You have been seen today for a knee injury. There were no acute abnormalities on the x-rays, including no sign of fracture or dislocation, however, there could be injuries to the soft tissues, such as the ligaments or tendons that are not seen on xrays. There could also be what are called occult fractures that are small fractures not seen on xray. Antiinflammatory medications: Take 600 mg of ibuprofen every 6 hours or 440 mg (over the counter dose) to 500 mg (prescription dose) of naproxen every 12 hours for the next 3 days. After this time, these medications may be used as needed for pain. Take these medications with food to avoid upset stomach. Choose only one of these medications, do not take them together. Acetaminophen (generic for Tylenol): Should you continue to have additional pain while taking the ibuprofen or naproxen, you may add in acetaminophen as needed. Your daily total maximum amount of acetaminophen from all sources should be limited to 4000mg /day for persons without liver problems, or 2000mg /day for those with liver problems. Ice: May apply ice to the area over the next 24 hours for 15 minutes at a time to reduce swelling. Elevation: Keep the extremity elevated as often as possible to reduce pain and inflammation. Support: Wear the knee immobilizer for support and comfort. Wear this until pain resolves. You will be weight-bearing as tolerated, which means you can slowly start to put weight on the extremity and increase amount and frequency as pain allows. Follow up: If symptoms are improving, you may follow up with your primary care provider for any continued management. If symptoms are not starting to improve within a week, you should follow up with the orthopedic specialist within two weeks. Return:  Return to the ED for numbness, weakness, increasing pain, overall worsening symptoms, loss of function, or if symptoms are not improving, you have tried to follow up with the orthopedic specialist, and have been unable to do so.  For prescription assistance, may try using prescription discount sites or apps, such as goodrx.com  Breast abscess Please take all of your antibiotics until finished!   You may develop abdominal discomfort or diarrhea from the antibiotic.  You may help offset this with probiotics which you can buy or get in yogurt. Do not eat or take the probiotics until 2 hours after your antibiotic.   Follow-up with general surgery, as previously recommended.

## 2018-09-25 NOTE — ED Notes (Signed)
CBG 497 mg/dL

## 2018-09-25 NOTE — ED Provider Notes (Signed)
Received signout at the beginning of shift.  Patient here with multiple complaints 1 of his nausea and vomiting unable to keep her medication down.  She also ran out of her medication for the past several weeks.  She does have an appointment with her PCP tomorrow.  She was found to be hyperglycemic without evidence of DKA.  She also complaining of right knee pain from a recent injury.  X-ray of right knee unremarkable.  CBG improved with IV fluid and subcutaneous insulin.  At this time she is stable for discharge with close follow-up with PCP.  BP (!) 157/64 (BP Location: Right Arm)   Pulse 85   Temp (!) 97.4 F (36.3 C) (Oral)   Resp (!) 21   Ht 5\' 6"  (1.676 m)   Wt 81.6 kg   SpO2 99%   BMI 29.05 kg/m   Results for orders placed or performed during the hospital encounter of 09/25/18  Urinalysis, Routine w reflex microscopic  Result Value Ref Range   Color, Urine COLORLESS (A) YELLOW   APPearance CLEAR CLEAR   Specific Gravity, Urine 1.028 1.005 - 1.030   pH 7.0 5.0 - 8.0   Glucose, UA >=500 (A) NEGATIVE mg/dL   Hgb urine dipstick NEGATIVE NEGATIVE   Bilirubin Urine NEGATIVE NEGATIVE   Ketones, ur NEGATIVE NEGATIVE mg/dL   Protein, ur NEGATIVE NEGATIVE mg/dL   Nitrite NEGATIVE NEGATIVE   Leukocytes,Ua NEGATIVE NEGATIVE   RBC / HPF 0-5 0 - 5 RBC/hpf   WBC, UA 0-5 0 - 5 WBC/hpf   Bacteria, UA NONE SEEN NONE SEEN   Squamous Epithelial / LPF 0-5 0 - 5  CBC with Differential  Result Value Ref Range   WBC 12.4 (H) 4.0 - 10.5 K/uL   RBC 6.38 (H) 3.87 - 5.11 MIL/uL   Hemoglobin 12.0 12.0 - 15.0 g/dL   HCT 41.9 36.0 - 46.0 %   MCV 65.7 (L) 80.0 - 100.0 fL   MCH 18.8 (L) 26.0 - 34.0 pg   MCHC 28.6 (L) 30.0 - 36.0 g/dL   RDW 25.8 (H) 11.5 - 15.5 %   Platelets 287 150 - 400 K/uL   nRBC 0.0 0.0 - 0.2 %   Neutrophils Relative % 75 %   Neutro Abs 9.3 (H) 1.7 - 7.7 K/uL   Lymphocytes Relative 17 %   Lymphs Abs 2.1 0.7 - 4.0 K/uL   Monocytes Relative 5 %   Monocytes Absolute 0.6 0.1 -  1.0 K/uL   Eosinophils Relative 1 %   Eosinophils Absolute 0.1 0.0 - 0.5 K/uL   Basophils Relative 2 %   Basophils Absolute 0.2 (H) 0.0 - 0.1 K/uL   nRBC 0 0 /100 WBC   Abs Immature Granulocytes 0.00 0.00 - 0.07 K/uL  Comprehensive metabolic panel  Result Value Ref Range   Sodium 131 (L) 135 - 145 mmol/L   Potassium 3.5 3.5 - 5.1 mmol/L   Chloride 97 (L) 98 - 111 mmol/L   CO2 21 (L) 22 - 32 mmol/L   Glucose, Bld 648 (HH) 70 - 99 mg/dL   BUN 5 (L) 6 - 20 mg/dL   Creatinine, Ser 0.83 0.44 - 1.00 mg/dL   Calcium 9.3 8.9 - 10.3 mg/dL   Total Protein 7.7 6.5 - 8.1 g/dL   Albumin 3.7 3.5 - 5.0 g/dL   AST 25 15 - 41 U/L   ALT 15 0 - 44 U/L   Alkaline Phosphatase 78 38 - 126 U/L   Total Bilirubin 0.4  0.3 - 1.2 mg/dL   GFR calc non Af Amer >60 >60 mL/min   GFR calc Af Amer >60 >60 mL/min   Anion gap 13 5 - 15  I-Stat beta hCG blood, ED  Result Value Ref Range   I-stat hCG, quantitative <5.0 <5 mIU/mL   Comment 3          CBG monitoring, ED  Result Value Ref Range   Glucose-Capillary 497 (H) 70 - 99 mg/dL  POCT I-Stat EG7  Result Value Ref Range   pH, Ven 7.373 7.250 - 7.430   pCO2, Ven 32.8 (L) 44.0 - 60.0 mmHg   pO2, Ven 49.0 (H) 32.0 - 45.0 mmHg   Bicarbonate 19.1 (L) 20.0 - 28.0 mmol/L   TCO2 20 (L) 22 - 32 mmol/L   O2 Saturation 84.0 %   Acid-base deficit 5.0 (H) 0.0 - 2.0 mmol/L   Sodium 132 (L) 135 - 145 mmol/L   Potassium 3.7 3.5 - 5.1 mmol/L   Calcium, Ion 1.13 (L) 1.15 - 1.40 mmol/L   HCT 42.0 36.0 - 46.0 %   Hemoglobin 14.3 12.0 - 15.0 g/dL   Patient temperature HIDE    Sample type VENOUS   CBG monitoring, ED  Result Value Ref Range   Glucose-Capillary 395 (H) 70 - 99 mg/dL  CBG monitoring, ED  Result Value Ref Range   Glucose-Capillary 244 (H) 70 - 99 mg/dL   Dg Knee Complete 4 Views Right  Result Date: 09/25/2018 CLINICAL DATA:  Status post fall, anterior right knee pain EXAM: RIGHT KNEE - COMPLETE 4+ VIEW COMPARISON:  None. FINDINGS: No evidence of  fracture, dislocation, or joint effusion. No evidence of arthropathy or other focal bone abnormality. Soft tissues are unremarkable. IMPRESSION: Negative. Electronically Signed   By: Kathreen Devoid   On: 09/25/2018 13:34      Domenic Moras, PA-C 09/25/18 1810    Drenda Freeze, MD 09/25/18 2328

## 2018-09-25 NOTE — ED Triage Notes (Signed)
Pt complaining of nausea and vomiting for two days. Pt reports that she also had a fall two days ago and hit her right knee. Pt reports 10/10 right knee pain at present time. Pt also reports that she has a "knot" under her left breast that is hurting. Pt denies any chest pain, shortness or breath, cough or fevers at home.

## 2018-10-03 ENCOUNTER — Other Ambulatory Visit: Payer: Self-pay | Admitting: Specialist

## 2018-10-03 DIAGNOSIS — N6320 Unspecified lump in the left breast, unspecified quadrant: Secondary | ICD-10-CM

## 2018-10-06 ENCOUNTER — Other Ambulatory Visit: Payer: Self-pay

## 2018-10-06 ENCOUNTER — Emergency Department (HOSPITAL_COMMUNITY)
Admission: EM | Admit: 2018-10-06 | Discharge: 2018-10-24 | Disposition: E | Payer: Medicare HMO | Attending: Emergency Medicine | Admitting: Emergency Medicine

## 2018-10-06 ENCOUNTER — Emergency Department (HOSPITAL_COMMUNITY): Payer: Medicare HMO

## 2018-10-06 DIAGNOSIS — F17210 Nicotine dependence, cigarettes, uncomplicated: Secondary | ICD-10-CM | POA: Insufficient documentation

## 2018-10-06 DIAGNOSIS — Z794 Long term (current) use of insulin: Secondary | ICD-10-CM | POA: Insufficient documentation

## 2018-10-06 DIAGNOSIS — Z85810 Personal history of malignant neoplasm of tongue: Secondary | ICD-10-CM | POA: Insufficient documentation

## 2018-10-06 DIAGNOSIS — E1140 Type 2 diabetes mellitus with diabetic neuropathy, unspecified: Secondary | ICD-10-CM | POA: Insufficient documentation

## 2018-10-06 DIAGNOSIS — R4020 Unspecified coma: Secondary | ICD-10-CM | POA: Diagnosis present

## 2018-10-06 DIAGNOSIS — Z79899 Other long term (current) drug therapy: Secondary | ICD-10-CM | POA: Insufficient documentation

## 2018-10-06 DIAGNOSIS — I10 Essential (primary) hypertension: Secondary | ICD-10-CM | POA: Insufficient documentation

## 2018-10-06 DIAGNOSIS — I469 Cardiac arrest, cause unspecified: Secondary | ICD-10-CM | POA: Diagnosis not present

## 2018-10-06 MED ORDER — NALOXONE HCL 2 MG/2ML IJ SOSY
PREFILLED_SYRINGE | INTRAMUSCULAR | Status: AC | PRN
  Administered 2018-10-06: 0.4 mg via INTRAVENOUS

## 2018-10-06 MED ORDER — EPINEPHRINE 1 MG/10ML IJ SOSY
PREFILLED_SYRINGE | INTRAMUSCULAR | Status: AC | PRN
  Administered 2018-10-06: 1 via INTRAVENOUS

## 2018-10-06 MED ORDER — SODIUM CHLORIDE 0.9 % IV SOLN
INTRAVENOUS | Status: AC | PRN
  Administered 2018-10-06: 1000 mL via INTRAVENOUS

## 2018-10-06 MED ORDER — SODIUM BICARBONATE 8.4 % IV SOLN
INTRAVENOUS | Status: AC | PRN
  Administered 2018-10-06: 100 meq via INTRAVENOUS

## 2018-10-06 MED ORDER — NOREPINEPHRINE 4 MG/250ML-% IV SOLN: 0.0000 ug/min | INTRAVENOUS | Status: DC

## 2018-10-06 NOTE — ED Notes (Signed)
Family placed in Consultation A Rm

## 2018-10-06 NOTE — ED Notes (Addendum)
Pt's Dtr [facility] [phone]. Pls contact for updates.

## 2018-10-06 NOTE — Code Documentation (Signed)
Pt arrived to ED s/p witnessed cardiac arrest.  Call received at 2141 for witnessed arrest EMS arrival and CPR initiated at 2148  ROSC Achieved at 2210 Total down time estimated 30 minutes Pt was sinus brady when ROSC was achieved. Pt being paced 60bpm at 146milAmp Continued pacing on Zoll upon arrival   EMS gave 3 epi en route IO R tib/fib in place Hugo airway in place Cap 40-70-60 CBG 223

## 2018-10-07 DIAGNOSIS — I469 Cardiac arrest, cause unspecified: Secondary | ICD-10-CM | POA: Diagnosis not present

## 2018-10-24 NOTE — ED Provider Notes (Signed)
[facility] EMERGENCY DEPARTMENT Provider Note   CSN: [id number] Arrival date & time: 10/04/2018  2213    History   Chief Complaint No chief complaint on file.   HPI [name] is a 43 y.o. female.     HPI Patient presents in extremis via EMS.  Level 5 caveat secondary to acuity of condition. Per report the patient was found unresponsive, and after approximately 10 minutes prior to fire arrival, finding patient in asystole, she received CPR, including epinephrine, and eventually had return of spontaneous circulation, though with requirement of transcutaneous pacing.  Resuscitation prior to ED arrival approximately 40 minutes. No reported trauma, patient reportedly in her car, in a parking lot of a grocery store.    Past Medical History:  Diagnosis Date  . Abnormal Pap smear   . Anemia   . Anxiety   . Bipolar 1 disorder ([place])   . Depression   . Diabetes mellitus   . Diabetes mellitus without complication ([place])   . Gout   . Headache(784.0)   . Kidney calculi   . 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 Seasonal allergies   . Tongue cancer ([place])   . UTI (urinary tract infection)     Patient Active Problem List   Diagnosis Date Noted  . Chronic anemia   . Other headache syndrome   . Malingering 08/14/2018  . Acute CHF (congestive heart failure) ([place]) 08/13/2018  . Opioid abuse with opioid-induced mood disorder ([place]) 08/17/2015  . Alcohol abuse 08/17/2015  . Depression, major, recurrent, moderate ([place]) 08/17/2015  . Benzodiazepine abuse, continuous ([place]) 08/16/2015  . Opioid use disorder, moderate, dependence ([place]) 08/16/2015  . Major depressive disorder, single episode, moderate ([place]) 08/16/2015  . Benzodiazepine abuse ([place]) 08/16/2015  . Constipation due to opioid therapy 04/28/2014  . Nausea and vomiting in adult   . Cellulitis, abdominal wall 04/27/2014  . Intractable nausea and  vomiting 04/27/2014  . Drug-seeking behavior 04/27/2014  . Broken toe 03/30/2014  . Abdominal pain, other specified site 04/12/2013  . Diabetes mellitus, new onset ([place]) 04/12/2013  . Diarrhea 04/12/2013  . Clostridium difficile colitis 03/22/2013  . HTN (hypertension) 03/22/2013  . History of MRSA infection with recurrent vulvar abscesses 03/10/2013  . Vaginal candidiasis 03/10/2013  . Nausea and vomiting 03/07/2013  . Hypokalemia 03/07/2013  . Acute pancreatitis 09/05/2012  . Nausea & vomiting 09/02/2012  . Abdominal pain 09/02/2012  . Narcotic abuse ([place]) 07/04/2012  . DIABETES MELLITUS, TYPE II 05/05/2007  . DIABETIC PERIPHERAL NEUROPATHY 05/05/2007  . Microcytic anemia 05/05/2007  . ANXIETY 05/05/2007  . TOBACCO ABUSE 05/05/2007  . GERD 05/05/2007  . Backache 05/05/2007    Past Surgical History:  Procedure Laterality Date  . CESAREAN SECTION    . CHOLECYSTECTOMY    . ESOPHAGOGASTRODUODENOSCOPY N/A 09/07/2012   Procedure: ESOPHAGOGASTRODUODENOSCOPY (EGD);  Surgeon: Juanita Craver, MD;  Location: [facility] ENDOSCOPY;  Service: Endoscopy;  Laterality: N/A;     OB History    Gravida  8   Para  8   Term  7   Preterm  1   AB  0   Living  7     SAB  0   TAB      Ectopic      Multiple      Live Births               Home Medications    Prior  to Admission medications   Medication Sig Start Date End Date Taking? Authorizing Provider  amitriptyline (ELAVIL) 150 MG tablet Take 150 mg by mouth at bedtime. 07/28/18   [provider]  ferrous fumarate (HEMOCYTE - 106 MG FE) 325 (106 Fe) MG TABS tablet Take 1 tablet (106 mg of iron total) by mouth 2 (two) times daily with a meal. 08/15/18   Domenic Polite, MD  furosemide (LASIX) 20 MG tablet Take 1 tablet (20 mg total) by mouth as needed for fluid or edema. 08/15/18   Domenic Polite, MD  gabapentin (NEURONTIN) 300 MG capsule Take 900 mg by mouth 3 (three) times daily.    [provider]  glipiZIDE  (GLUCOTROL) 10 MG tablet Take 20 mg by mouth daily. 07/25/18   [provider]  insulin aspart (NOVOLOG FLEXPEN) 100 UNIT/ML FlexPen Inject 30 Units into the skin 2 (two) times daily with a meal. Patient taking differently: Inject 40 Units into the skin 2 (two) times daily with a meal.  08/11/18   Veryl Speak, MD  Insulin Glargine (LANTUS SOLOSTAR) 100 UNIT/ML Solostar Pen Inject 80 Units into the skin at bedtime. For diabetes management 08/15/18   Domenic Polite, MD  metFORMIN (GLUCOPHAGE) 1000 MG tablet Take 1 tablet (1,000 mg total) by mouth 2 (two) times daily with a meal. For diabetes management Patient not taking: Reported on 08/13/2018 08/18/15   Lindell Spar I, NP  metoprolol tartrate (LOPRESSOR) 25 MG tablet Take 0.5 tablets (12.5 mg total) by mouth 2 (two) times daily. 08/15/18   Domenic Polite, MD  nicotine (NICODERM CQ - DOSED IN MG/24 HOURS) 21 mg/24hr patch Place 1 patch (21 mg total) onto the skin daily. For smoking cessation Patient not taking: Reported on 08/13/2018 08/18/15   Encarnacion Slates, NP    Family History Family History  Problem Relation Age of Onset  . Hypertension Mother   . Diabetes Mother   . Hypertension Maternal Aunt   . Diabetes Maternal Aunt   . Hypertension Maternal Uncle   . Diabetes Maternal Uncle   . Hypertension Maternal Grandmother     Social History Social History   Tobacco Use  . Smoking status: Current Every Day Smoker    Packs/day: 0.50    Years: 25.00    Pack years: 12.50    Types: Cigarettes  . Smokeless tobacco: Never Used  Substance Use Topics  . Alcohol use: No    Comment: Denies  . Drug use: No    Comment: Denies     Allergies   Reglan [metoclopramide]; Bentyl [dicyclomine hcl]; Compazine [prochlorperazine edisylate]; Ketorolac; Morphine and related; and Zofran [ondansetron hcl]   Review of Systems Review of Systems  Unable to perform ROS: Acuity of condition     Physical Exam Updated Vital Signs BP (!) 102/33    Temp (!) 96.5 F (35.8 C) (Temporal)   Resp (!) 44   Ht 5\' 6"  (1.676 m)   Wt 81.6 kg   SpO2 95%   BMI 29.05 kg/m   Physical Exam Vitals signs and nursing note reviewed.  Constitutional:      General: She is in acute distress.     Appearance: She is well-developed. She is ill-appearing.  HENT:     Head: Normocephalic and atraumatic.  Eyes:     Conjunctiva/sclera: Conjunctivae normal.  Cardiovascular:     Comments: Good capture on arrival with transcutaneous pacing at 60 bpm Pulmonary:     Comments: Diminished though clear breath sounds bilaterally with assisted  ventilation Abdominal:     Comments: Large, not protuberant  Skin:    General: Skin is warm and dry.  Neurological:     Cranial Nerves: No cranial nerve deficit.     Comments: Flaccid, unresponsive, pupils midrange, nonreactive  Psychiatric:     Comments: Altered      ED Treatments / Results   Procedures Procedures (including critical care time)  Medications Ordered in ED Medications  norepinephrine (LEVOPHED) 4mg  in 284mL premix infusion (has no administration in time range)  EPINEPHrine (ADRENALIN) 1 MG/10ML injection (1 Syringe Intravenous Given 13-Oct-2018 09/24/2231)  sodium bicarbonate injection (100 mEq Intravenous Given 10-13-18 2235)  EPINEPHrine (ADRENALIN) 1 MG/10ML injection (1 Syringe Intravenous Given 13-Oct-2018 2234/09/24)  naloxone [facility]) injection (0.4 mg Intravenous Given 10/13/18 2235/09/24)  0.9 %  sodium chloride infusion ( Intravenous Stopped 10-13-2018 2254)     Initial Impression / Assessment and Plan / ED Course  I have reviewed the triage vital signs and the nursing notes.  Pertinent labs & imaging results that were available during my care of the patient were reviewed by me and considered in my medical decision making (see chart for details).  Immediately after arrival the patient continued resuscitation efforts with transcutaneous pacing, placement on continuous monitoring, provision of supplemental  oxygen, and replacement of King airway with endotracheal tube, which was performed without complication on the first pass.  INTUBATION Performed by: Carmin Muskrat  Required items: required blood products, implants, devices, and special equipment available Patient identity confirmed: provided demographic data and hospital-assigned identification number Time out: Immediately prior to procedure a "time out" was called to verify the correct patient, procedure, equipment, support staff and site/side marked as required.  Indications: cardiac arrest, airway protection  Intubation method: Glidescope Laryngoscopy   Preoxygenation: BVM   Tube Size:7.5 cuffed  Post-procedure assessment: chest rise and ETCO2 monitor Breath sounds: equal and absent over the epigastrium Tube secured with: ETT holder  Patient tolerated the procedure well with no immediate complications.  Subsequently, patient was preparing for norepinephrine drip as she was hypotensive on arrival.  However, prior to initiation of this, the patient had bradycardia, required additional CPR. Over the following 30 minutes the patient had multiple rounds of ACLS guided CPR, with defibrillation x2, epinephrine, bicarbonate, Narcan, with no sustained evidence of life. I discussed the patient's presentation with her father and 2 of her adult children, who are aware of her as needing medical examiner evaluation, and the patient suggested school be signed by her primary care physician, Dr. York Ram.  I attempted to contact Dr. Bebe Liter office, which is currently closed. This 43 year old female with multiple medical issues including depression, diabetes, hypertension, reported substance abuse issues presents in extremis, after prolonged downtime. Patient was unable to be revived in spite of vigorous resuscitation efforts, was pronounced dead at 2248/09/23, 10/13/2018.   Final Clinical Impressions(s) / ED Diagnoses   Final diagnoses:   Cardiac arrest [facility])    CRITICAL CARE Performed by: Carmin Muskrat Total critical care time: 45 minutes Critical care time was exclusive of separately billable procedures and treating other patients. Critical care was necessary to treat or prevent imminent or life-threatening deterioration. Critical care was time spent personally by me on the following activities: development of treatment plan with patient and/or surrogate as well as nursing, discussions with consultants, evaluation of patient's response to treatment, examination of patient, obtaining history from patient or surrogate, ordering and performing treatments and interventions, ordering and review of laboratory studies, ordering and  review of radiographic studies, pulse oximetry and re-evaluation of patient's condition.    Carmin Muskrat, MD 10/25/18 [phone]

## 2018-10-24 NOTE — Progress Notes (Signed)
Chaplain was called to help this family whose family member died in resuscitation room.  Frequent patient-staff familiar with family.  Chaplain called to help the family and gather next of kin info.  There was stress with family wanting staff to come and speak with them multiple times.  Chaplain sought to help them settle down and requested info.  Eventually the family was able to be with the deceased and 1 or 2 at at a time.  Chaplain had to respond to a  CODE and returned to help family compete the visitation and gave the a patient placement card so the next of kin can call tomorrow and tell which funeral home they plan to use.    10-20-2018 0100  Clinical Encounter Type  Visited With Family;Health care provider  Visit Type Death;ED  Referral From Nurse  Consult/Referral To Chaplain  Spiritual Encounters  Spiritual Needs Emotional;Grief support;Other (Comment) (guide from consult room to view body)  Stress Factors  Patient Stress Factors Health changes;Not reviewed  Family Stress Factors Health changes;Loss;Other (Comment) (difficult to settle and calm down)  Conard Novak, Chaplain

## 2018-10-24 NOTE — ED Notes (Signed)
Family at bedside. 

## 2018-10-24 NOTE — ED Notes (Signed)
Patient taken to morgue at this time.

## 2018-10-24 DEATH — deceased

## 2018-11-03 ENCOUNTER — Other Ambulatory Visit: Payer: Self-pay

## 2020-01-23 IMAGING — DX DG CHEST 2V
2 series · 2 of 2 positions shown · non-contrast
Comparison: 08/10/2018

CLINICAL DATA: Shortness of breath

EXAM:
CHEST - 2 VIEW

[w chest pa]
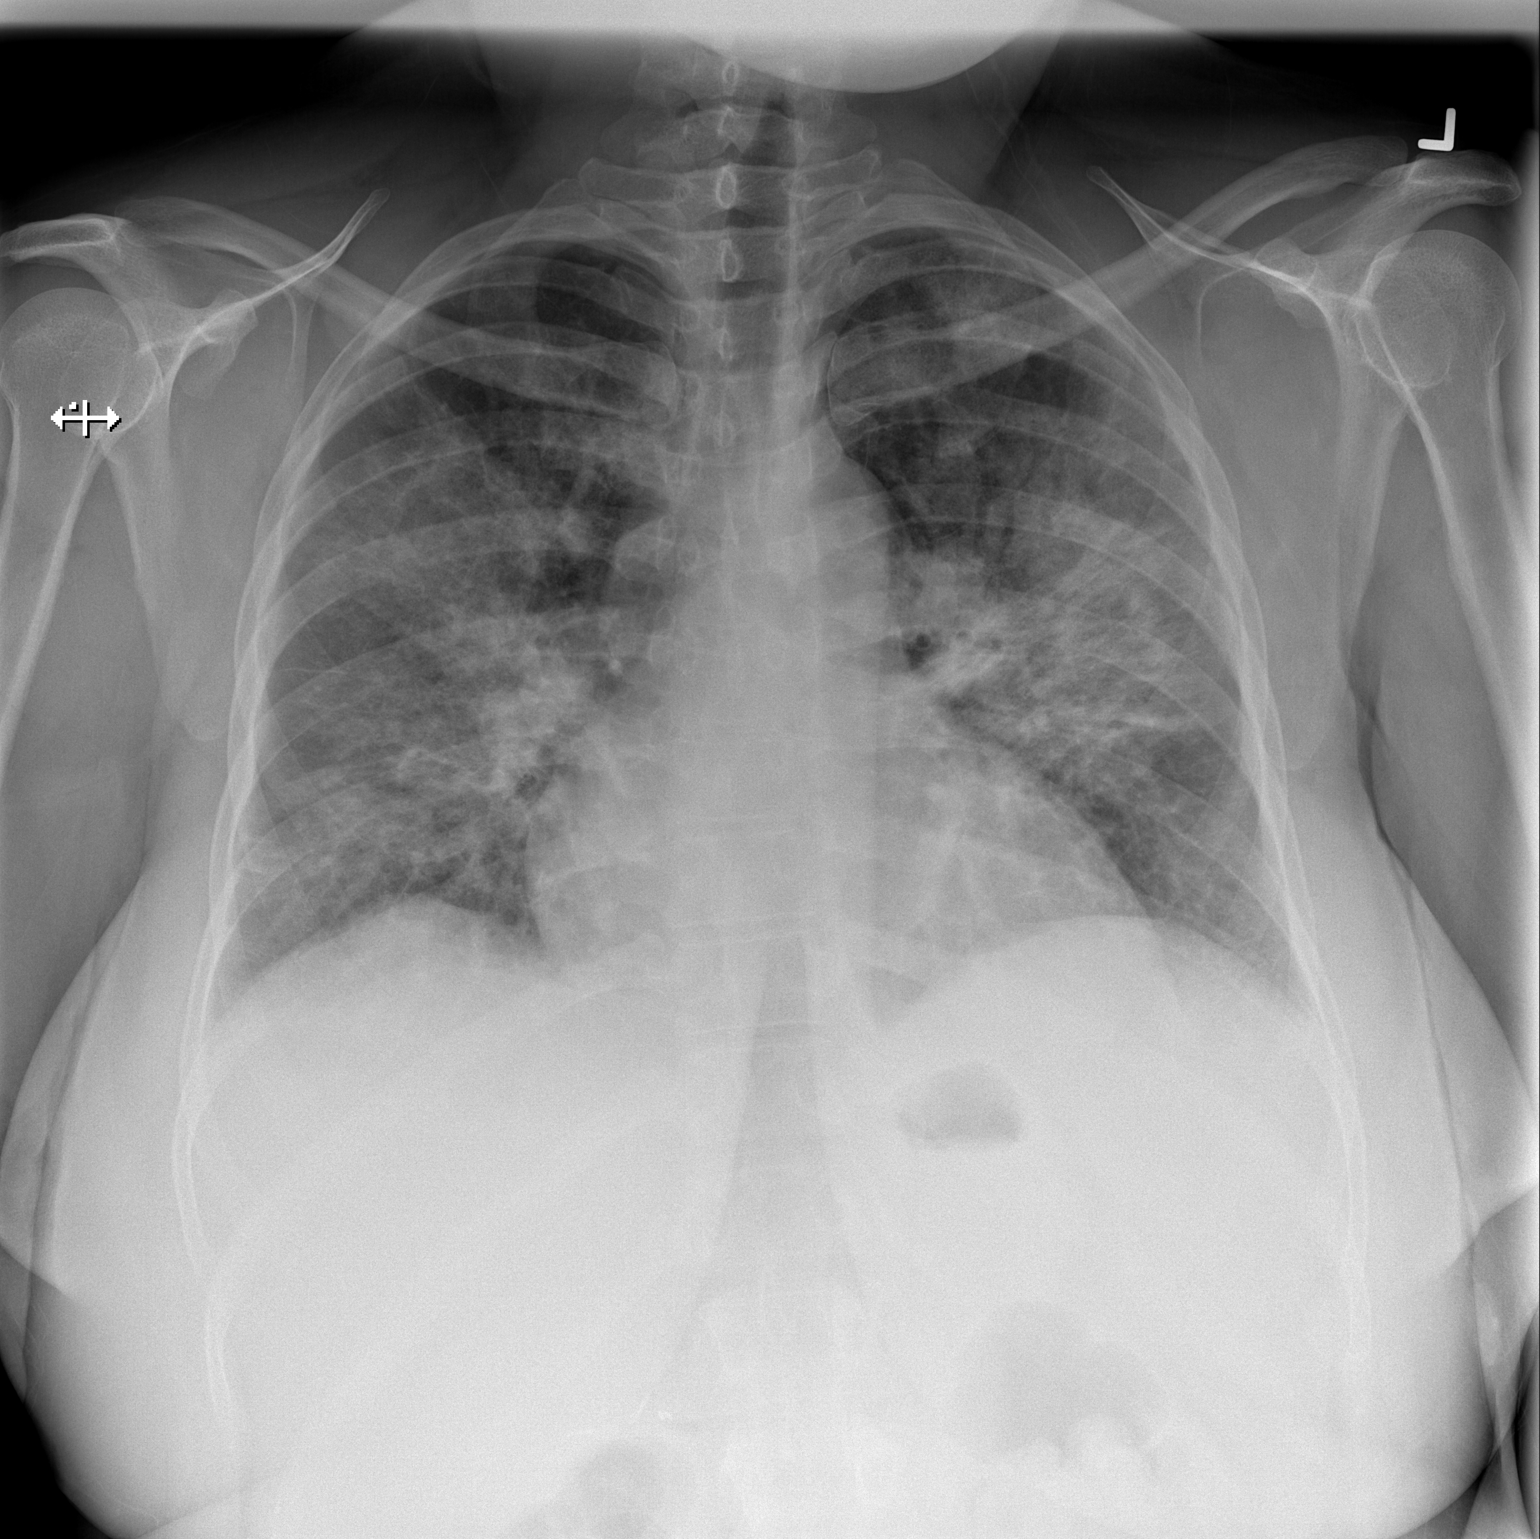

[w chest lat]
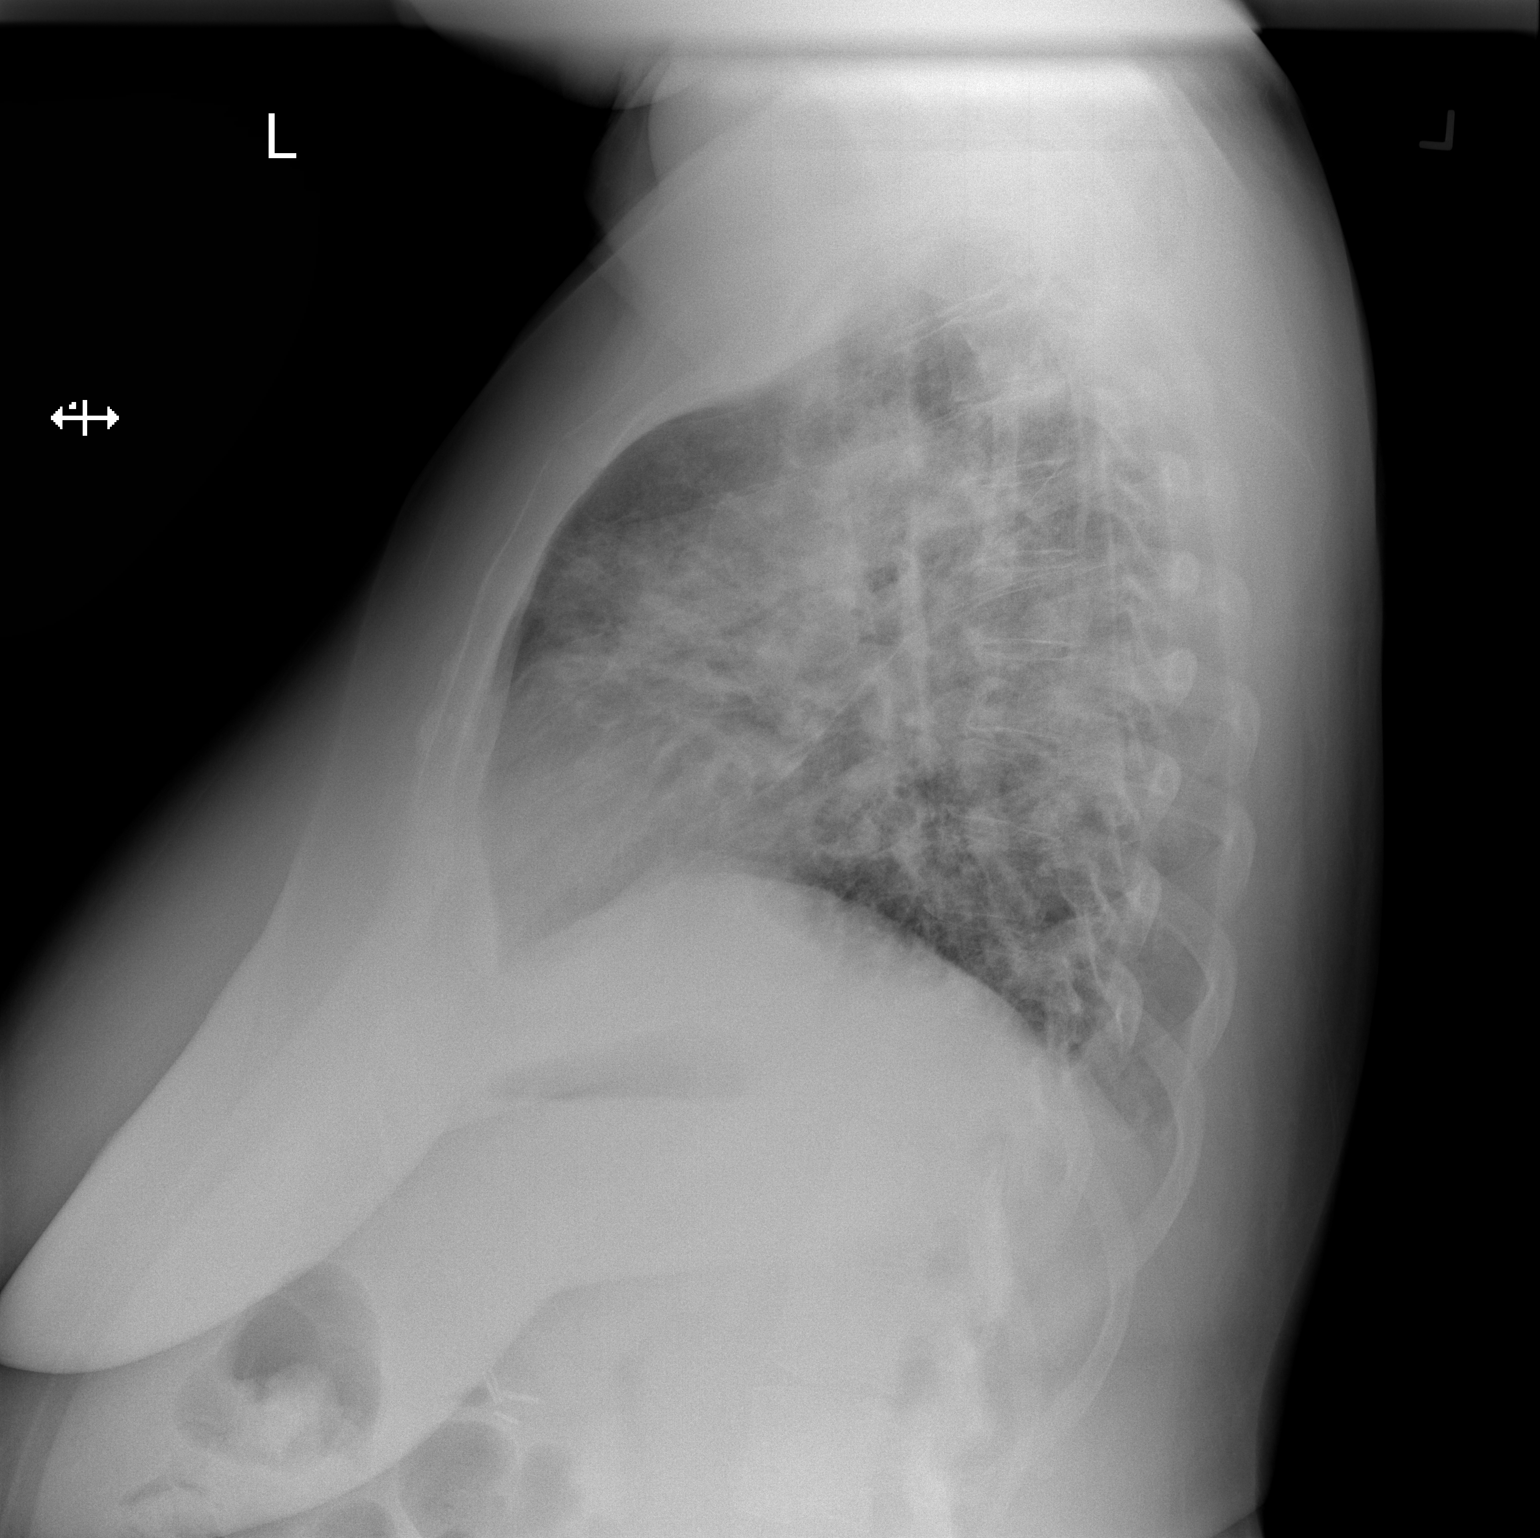

[2 of 2 positions shown; findings below may reference images not displayed]

FINDINGS: Moderate-to-marked bilateral ground-glass opacity and
consolidations, progressed since prior radiograph. No pleural
effusion. Stable borderline to mild cardiomegaly. No pneumothorax.
IMPRESSION: Moderate bilateral ground-glass opacity and consolidations which may
reflect edema or bilateral pneumonia. Borderline to mild
cardiomegaly. Findings are progressed since 08/10/2018
